# Patient Record
Sex: Male | Born: 1935
Health system: Southern US, Community
[De-identification: ages and names within clinical notes are randomized; demographics above are authoritative.]

## PROBLEM LIST (undated history)

## (undated) DIAGNOSIS — R011 Cardiac murmur, unspecified: Secondary | ICD-10-CM

## (undated) DIAGNOSIS — I10 Essential (primary) hypertension: Secondary | ICD-10-CM

## (undated) DIAGNOSIS — K5790 Diverticulosis of intestine, part unspecified, without perforation or abscess without bleeding: Secondary | ICD-10-CM

## (undated) DIAGNOSIS — M1990 Unspecified osteoarthritis, unspecified site: Secondary | ICD-10-CM

## (undated) DIAGNOSIS — J45909 Unspecified asthma, uncomplicated: Secondary | ICD-10-CM

## (undated) DIAGNOSIS — K219 Gastro-esophageal reflux disease without esophagitis: Secondary | ICD-10-CM

## (undated) DIAGNOSIS — M109 Gout, unspecified: Secondary | ICD-10-CM

## (undated) DIAGNOSIS — IMO0001 Reserved for inherently not codable concepts without codable children: Secondary | ICD-10-CM

## (undated) HISTORY — DX: Cardiac murmur, unspecified: R01.1

## (undated) HISTORY — DX: Unspecified osteoarthritis, unspecified site: M19.90

## (undated) HISTORY — DX: Essential (primary) hypertension: I10

## (undated) HISTORY — DX: Diverticulosis of intestine, part unspecified, without perforation or abscess without bleeding: K57.90

## (undated) HISTORY — DX: Gastro-esophageal reflux disease without esophagitis: K21.9

## (undated) HISTORY — DX: Reserved for inherently not codable concepts without codable children: IMO0001

## (undated) HISTORY — DX: Gout, unspecified: M10.9

## (undated) HISTORY — PX: JOINT REPLACEMENT: SHX530

## (undated) HISTORY — PX: EYE SURGERY: SHX253

## (undated) HISTORY — PX: INGUINAL HERNIA REPAIR: SUR1180

---

## 2000-09-28 ENCOUNTER — Encounter: Payer: Self-pay | Admitting: Family Medicine

## 2000-09-28 ENCOUNTER — Ambulatory Visit (HOSPITAL_COMMUNITY): Admission: RE | Admit: 2000-09-28 | Discharge: 2000-09-28 | Payer: Self-pay | Admitting: Family Medicine

## 2001-03-16 HISTORY — PX: COLONOSCOPY: SHX174

## 2001-06-07 ENCOUNTER — Encounter: Payer: Self-pay | Admitting: Internal Medicine

## 2001-06-07 ENCOUNTER — Ambulatory Visit (HOSPITAL_COMMUNITY): Admission: RE | Admit: 2001-06-07 | Discharge: 2001-06-07 | Payer: Self-pay | Admitting: Internal Medicine

## 2001-10-04 ENCOUNTER — Ambulatory Visit (HOSPITAL_COMMUNITY): Admission: RE | Admit: 2001-10-04 | Discharge: 2001-10-04 | Payer: Self-pay | Admitting: Family Medicine

## 2001-10-04 ENCOUNTER — Encounter: Payer: Self-pay | Admitting: Family Medicine

## 2001-10-21 ENCOUNTER — Ambulatory Visit (HOSPITAL_COMMUNITY): Admission: RE | Admit: 2001-10-21 | Discharge: 2001-10-21 | Payer: Self-pay | Admitting: General Surgery

## 2002-09-15 ENCOUNTER — Encounter: Payer: Self-pay | Admitting: Urology

## 2002-09-15 ENCOUNTER — Ambulatory Visit (HOSPITAL_COMMUNITY): Admission: RE | Admit: 2002-09-15 | Discharge: 2002-09-15 | Payer: Self-pay | Admitting: Urology

## 2003-09-12 ENCOUNTER — Ambulatory Visit (HOSPITAL_COMMUNITY): Admission: RE | Admit: 2003-09-12 | Discharge: 2003-09-12 | Payer: Self-pay | Admitting: Family Medicine

## 2004-05-02 ENCOUNTER — Ambulatory Visit (HOSPITAL_COMMUNITY): Admission: RE | Admit: 2004-05-02 | Discharge: 2004-05-02 | Payer: Self-pay | Admitting: Family Medicine

## 2004-09-26 ENCOUNTER — Ambulatory Visit (HOSPITAL_COMMUNITY): Admission: RE | Admit: 2004-09-26 | Discharge: 2004-09-26 | Payer: Self-pay | Admitting: Family Medicine

## 2005-03-16 HISTORY — PX: APPENDECTOMY: SHX54

## 2005-04-02 ENCOUNTER — Ambulatory Visit (HOSPITAL_COMMUNITY): Admission: RE | Admit: 2005-04-02 | Discharge: 2005-04-02 | Payer: Self-pay | Admitting: Family Medicine

## 2005-09-11 ENCOUNTER — Encounter (INDEPENDENT_AMBULATORY_CARE_PROVIDER_SITE_OTHER): Payer: Self-pay | Admitting: Specialist

## 2005-09-11 ENCOUNTER — Observation Stay (HOSPITAL_COMMUNITY): Admission: EM | Admit: 2005-09-11 | Discharge: 2005-09-12 | Payer: Self-pay | Admitting: Emergency Medicine

## 2005-12-15 ENCOUNTER — Ambulatory Visit (HOSPITAL_COMMUNITY): Admission: RE | Admit: 2005-12-15 | Discharge: 2005-12-15 | Payer: Self-pay | Admitting: Internal Medicine

## 2006-10-05 ENCOUNTER — Ambulatory Visit (HOSPITAL_COMMUNITY): Admission: RE | Admit: 2006-10-05 | Discharge: 2006-10-05 | Payer: Self-pay | Admitting: Family Medicine

## 2007-10-06 ENCOUNTER — Ambulatory Visit (HOSPITAL_COMMUNITY): Admission: RE | Admit: 2007-10-06 | Discharge: 2007-10-06 | Payer: Self-pay | Admitting: Family Medicine

## 2007-10-11 ENCOUNTER — Ambulatory Visit (HOSPITAL_COMMUNITY): Admission: RE | Admit: 2007-10-11 | Discharge: 2007-10-11 | Payer: Self-pay | Admitting: Family Medicine

## 2007-11-07 ENCOUNTER — Ambulatory Visit (HOSPITAL_COMMUNITY): Admission: RE | Admit: 2007-11-07 | Discharge: 2007-11-07 | Payer: Self-pay | Admitting: Family Medicine

## 2008-03-26 ENCOUNTER — Encounter: Payer: Self-pay | Admitting: Orthopedic Surgery

## 2008-03-26 ENCOUNTER — Ambulatory Visit (HOSPITAL_COMMUNITY): Admission: RE | Admit: 2008-03-26 | Discharge: 2008-03-26 | Payer: Self-pay | Admitting: Internal Medicine

## 2008-04-02 ENCOUNTER — Encounter: Payer: Self-pay | Admitting: Orthopedic Surgery

## 2008-04-04 ENCOUNTER — Encounter: Payer: Self-pay | Admitting: Orthopedic Surgery

## 2008-04-05 ENCOUNTER — Ambulatory Visit: Payer: Self-pay | Admitting: Orthopedic Surgery

## 2008-04-05 DIAGNOSIS — M1710 Unilateral primary osteoarthritis, unspecified knee: Secondary | ICD-10-CM

## 2008-04-05 DIAGNOSIS — IMO0002 Reserved for concepts with insufficient information to code with codable children: Secondary | ICD-10-CM | POA: Insufficient documentation

## 2008-06-18 ENCOUNTER — Ambulatory Visit: Payer: Self-pay | Admitting: Orthopedic Surgery

## 2008-07-18 ENCOUNTER — Ambulatory Visit: Payer: Self-pay | Admitting: Orthopedic Surgery

## 2008-08-08 ENCOUNTER — Ambulatory Visit: Payer: Self-pay | Admitting: Orthopedic Surgery

## 2008-09-24 ENCOUNTER — Ambulatory Visit: Payer: Self-pay | Admitting: Orthopedic Surgery

## 2008-10-09 ENCOUNTER — Ambulatory Visit (HOSPITAL_COMMUNITY): Admission: RE | Admit: 2008-10-09 | Discharge: 2008-10-09 | Payer: Self-pay | Admitting: Ophthalmology

## 2008-11-12 ENCOUNTER — Ambulatory Visit: Payer: Self-pay | Admitting: Orthopedic Surgery

## 2009-01-14 ENCOUNTER — Ambulatory Visit: Payer: Self-pay | Admitting: Orthopedic Surgery

## 2009-02-04 ENCOUNTER — Ambulatory Visit: Payer: Self-pay | Admitting: Orthopedic Surgery

## 2009-03-04 ENCOUNTER — Encounter: Payer: Self-pay | Admitting: Orthopedic Surgery

## 2009-03-04 ENCOUNTER — Telehealth: Payer: Self-pay | Admitting: Orthopedic Surgery

## 2009-03-12 ENCOUNTER — Ambulatory Visit: Payer: Self-pay | Admitting: Orthopedic Surgery

## 2009-04-01 ENCOUNTER — Ambulatory Visit (HOSPITAL_COMMUNITY): Admission: RE | Admit: 2009-04-01 | Discharge: 2009-04-01 | Payer: Self-pay | Admitting: Family Medicine

## 2009-04-17 ENCOUNTER — Ambulatory Visit: Payer: Self-pay | Admitting: Orthopedic Surgery

## 2009-04-22 ENCOUNTER — Ambulatory Visit (HOSPITAL_COMMUNITY): Admission: RE | Admit: 2009-04-22 | Discharge: 2009-04-22 | Payer: Self-pay | Admitting: Family Medicine

## 2009-05-13 ENCOUNTER — Ambulatory Visit: Payer: Self-pay | Admitting: Orthopedic Surgery

## 2009-06-17 ENCOUNTER — Encounter (INDEPENDENT_AMBULATORY_CARE_PROVIDER_SITE_OTHER): Payer: Self-pay | Admitting: *Deleted

## 2009-06-17 ENCOUNTER — Ambulatory Visit: Payer: Self-pay | Admitting: Orthopedic Surgery

## 2009-07-10 ENCOUNTER — Ambulatory Visit: Payer: Self-pay | Admitting: Orthopedic Surgery

## 2009-08-15 ENCOUNTER — Ambulatory Visit: Payer: Self-pay | Admitting: Orthopedic Surgery

## 2009-09-09 ENCOUNTER — Encounter: Payer: Self-pay | Admitting: Orthopedic Surgery

## 2009-09-11 ENCOUNTER — Ambulatory Visit: Payer: Self-pay | Admitting: Orthopedic Surgery

## 2009-09-13 ENCOUNTER — Encounter: Payer: Self-pay | Admitting: Orthopedic Surgery

## 2009-09-13 HISTORY — PX: TOTAL KNEE ARTHROPLASTY: SHX125

## 2009-09-17 ENCOUNTER — Telehealth (INDEPENDENT_AMBULATORY_CARE_PROVIDER_SITE_OTHER): Payer: Self-pay | Admitting: *Deleted

## 2009-09-18 ENCOUNTER — Ambulatory Visit: Payer: Self-pay | Admitting: Orthopedic Surgery

## 2009-09-19 ENCOUNTER — Encounter (INDEPENDENT_AMBULATORY_CARE_PROVIDER_SITE_OTHER): Payer: Self-pay | Admitting: *Deleted

## 2009-09-23 ENCOUNTER — Encounter: Payer: Self-pay | Admitting: Orthopedic Surgery

## 2009-09-24 ENCOUNTER — Ambulatory Visit: Payer: Self-pay | Admitting: Orthopedic Surgery

## 2009-09-24 ENCOUNTER — Inpatient Hospital Stay (HOSPITAL_COMMUNITY): Admission: RE | Admit: 2009-09-24 | Discharge: 2009-09-27 | Payer: Self-pay | Admitting: Orthopedic Surgery

## 2009-09-30 ENCOUNTER — Telehealth: Payer: Self-pay | Admitting: Orthopedic Surgery

## 2009-09-30 ENCOUNTER — Ambulatory Visit: Payer: Self-pay | Admitting: Orthopedic Surgery

## 2009-10-01 ENCOUNTER — Encounter: Payer: Self-pay | Admitting: Orthopedic Surgery

## 2009-10-02 ENCOUNTER — Encounter: Payer: Self-pay | Admitting: Orthopedic Surgery

## 2009-10-03 ENCOUNTER — Telehealth: Payer: Self-pay | Admitting: Orthopedic Surgery

## 2009-10-08 ENCOUNTER — Ambulatory Visit (HOSPITAL_COMMUNITY): Admission: RE | Admit: 2009-10-08 | Discharge: 2009-10-08 | Payer: Self-pay | Admitting: Family Medicine

## 2009-10-08 ENCOUNTER — Ambulatory Visit: Payer: Self-pay | Admitting: Orthopedic Surgery

## 2009-10-09 ENCOUNTER — Encounter: Payer: Self-pay | Admitting: Orthopedic Surgery

## 2009-10-09 ENCOUNTER — Telehealth: Payer: Self-pay | Admitting: Orthopedic Surgery

## 2009-10-10 ENCOUNTER — Encounter: Payer: Self-pay | Admitting: Orthopedic Surgery

## 2009-10-15 ENCOUNTER — Encounter: Payer: Self-pay | Admitting: Infectious Diseases

## 2009-10-16 ENCOUNTER — Telehealth: Payer: Self-pay | Admitting: Orthopedic Surgery

## 2009-10-21 ENCOUNTER — Ambulatory Visit: Payer: Self-pay | Admitting: Orthopedic Surgery

## 2009-10-24 ENCOUNTER — Encounter (HOSPITAL_COMMUNITY): Admission: RE | Admit: 2009-10-24 | Discharge: 2009-11-23 | Payer: Self-pay | Admitting: Orthopedic Surgery

## 2009-10-28 ENCOUNTER — Encounter: Payer: Self-pay | Admitting: Orthopedic Surgery

## 2009-11-11 ENCOUNTER — Ambulatory Visit: Payer: Self-pay | Admitting: Orthopedic Surgery

## 2009-11-25 ENCOUNTER — Encounter (HOSPITAL_COMMUNITY)
Admission: RE | Admit: 2009-11-25 | Discharge: 2009-12-25 | Payer: Self-pay | Source: Home / Self Care | Admitting: Orthopedic Surgery

## 2009-12-04 ENCOUNTER — Ambulatory Visit: Payer: Self-pay | Admitting: Orthopedic Surgery

## 2009-12-05 ENCOUNTER — Encounter: Payer: Self-pay | Admitting: Orthopedic Surgery

## 2009-12-25 ENCOUNTER — Ambulatory Visit: Payer: Self-pay | Admitting: Orthopedic Surgery

## 2010-01-20 ENCOUNTER — Encounter (INDEPENDENT_AMBULATORY_CARE_PROVIDER_SITE_OTHER): Payer: Self-pay | Admitting: *Deleted

## 2010-01-20 LAB — CONVERTED CEMR LAB
CO2: 29 meq/L
Calcium: 9.6 mg/dL
Creatinine, Ser: 1.25 mg/dL

## 2010-01-23 ENCOUNTER — Ambulatory Visit: Payer: Self-pay | Admitting: Orthopedic Surgery

## 2010-02-10 ENCOUNTER — Ambulatory Visit (HOSPITAL_COMMUNITY)
Admission: RE | Admit: 2010-02-10 | Discharge: 2010-02-10 | Payer: Self-pay | Source: Home / Self Care | Admitting: Family Medicine

## 2010-02-25 ENCOUNTER — Ambulatory Visit (HOSPITAL_COMMUNITY)
Admission: RE | Admit: 2010-02-25 | Discharge: 2010-02-25 | Payer: Self-pay | Source: Home / Self Care | Attending: Family Medicine | Admitting: Family Medicine

## 2010-02-25 ENCOUNTER — Ambulatory Visit: Payer: Self-pay | Admitting: Orthopedic Surgery

## 2010-03-13 ENCOUNTER — Encounter: Payer: Self-pay | Admitting: *Deleted

## 2010-03-14 ENCOUNTER — Ambulatory Visit: Payer: Self-pay | Admitting: Cardiology

## 2010-03-26 ENCOUNTER — Encounter: Payer: Self-pay | Admitting: Orthopedic Surgery

## 2010-04-02 ENCOUNTER — Ambulatory Visit: Admit: 2010-04-02 | Payer: Self-pay | Admitting: Orthopedic Surgery

## 2010-04-15 NOTE — Assessment & Plan Note (Signed)
Summary: WANTS FLUID DRAWN OFF LEFT KNEE/PYRAMID/BSF   Visit Type:  Follow-up  CC:  left knee pain.  History of Present Illness: I saw [name] in the office today for a followup visit.  He is a 77 years old man with the complaint of:  left knee swelling   DOS 10-01-09. Right total knee arthroplasty.  Meds:  Potassium, Torsemide, Robaxin, Restoril, HCTZ, Norco 5 as needed.  status post RIGHT total knee arthroplasty complains of LEFT knee pain. with swelling   Would like an injection.  We went ahead and aspirated and injected the LEFT knee. We got 75 cc of clear, yellow fluid, which was thick, and viscous.  We injected with steroids as well.  Patient like to set up the surgery next time       Allergies: 1)  ! Penicillin  Review of Systems Musculoskeletal:  Complains of joint pain, swelling, and stiffness.   Impression & Recommendations:  Problem # 1:  JOINT EFFUSION, KNEE (ICD-719.06) Assessment Deteriorated  Verbal consent was obtained. The knee was prepped with alcohol and ethyl chloride. 1 cc of depomedrol 40mg /cc and 4 cc of lidocaine 1% was injected. there were no complications.  Orders: Est. Patient Level III (16109) Joint Aspirate / Injection, Large (20610) Depo- Medrol 40mg  (J1030)  Patient Instructions: 1)  You have received an injection of cortisone today. You may experience increased pain at the injection site. Apply ice pack to the area for 20 minutes every 2 hours and take 2 xtra strength tylenol every 8 hours. This increased pain will usually resolve in 24 hours. The injection will take effect in 3-10 days.  2)  Please schedule a follow-up appointment as needed.   Orders Added: 1)  Est. Patient Level III [60454] 2)  Joint Aspirate / Injection, Large [20610] 3)  Depo- Medrol 40mg  [J1030]

## 2010-04-15 NOTE — Progress Notes (Signed)
Summary: Protime/Incision warm/pink  Phone Note Other Incoming   Summary of Call: sharon Walker/[facility] about [name] (08-23-2025) PT 17.6  INR 1.5 taking 1 mg Coumadin a day then alternating to 2 the next day.,   wants to know when to check protime again. 2.  Said his incision is pink and warm on each side  101.5 fever over the weekend, today 99.6 Told him to come in this afternoon tp have incision checked Initial call taken by: Jacklynn Ganong,  September 30, 2009 1:24 PM

## 2010-04-15 NOTE — Assessment & Plan Note (Signed)
Summary: FLUID BOTH KNEES/UMR/CAF   Visit Type:  Follow-up  CC:  swelling.  History of Present Illness: [name] is 77 years old has bilateral valgus osteoarthritis of the knees and comes infrequently for aspirations and injections  He is currently on Norco 5 mg q.4 p.r.n. pain  Swelling return in about 2 weeks ago and progressively increased.  Bilateral knee aspirations over 100 cc of clear yellow fluid injected both with 40 mg of Depo-Medrol  No complications  Verbal consent was obtained. The knee was prepped with alcohol and ethyl chloride. 1 cc of depomedrol 40mg /cc and 4 cc of lidocaine 1% was injected. there were no complications.-LT  Verbal consent was obtained. The knee was prepped with alcohol and ethyl chloride. 1 cc of depomedrol 40mg /cc and 4 cc of lidocaine 1% was injected. there were no complications.-RT        Allergies: 1)  ! Penicillin   Impression & Recommendations:  Problem # 1:  JOINT EFFUSION, KNEE (ICD-719.06) Assessment Deteriorated  Orders: Joint Aspirate / Injection, Large (20610) Depo- Medrol 40mg  (J1030)  Patient Instructions: 1)  You have received an injection of cortisone today. You may experience increased pain at the injection site. Apply ice pack to the area for 20 minutes every 2 hours and take 2 xtra strength tylenol every 8 hours. This increased pain will usually resolve in 24 hours. The injection will take effect in 3-10 days.  2)  Please schedule a follow-up appointment as needed.

## 2010-04-15 NOTE — Assessment & Plan Note (Signed)
Summary: 6 wk RE-CK/POST OP TKA,SURG 09/24/09/PYRAMID/CAF   Visit Type:  Follow-up  CC:  post op right knee.  History of Present Illness: 77 year old male now approximately 10 weeks status post RIGHT total knee arthroplasty for severe deformity and flexion contracture  DOS 10-01-09. Right total knee arthroplasty.  2 1/2 months   Meds:  Potassium, Torsemide, Robaxin, Restoril, HCTZ, Norco 5 as needed.   He does have some discomfort in his knee he is using topical Voltaren gell which seems to help  His range of motion is now 5-125  Exam plating with no assistive device.  He does complain of some LEFT knee pain but is not ready to have surgery there.  Allergies: 1)  ! Penicillin   Impression & Recommendations:  Problem # 1:  TOTAL KNEE FOLLOW-UP (ICD-V43.65)  Orders: Post-Op Check (81191)  Problem # 2:  KNEE, ARTHRITIS, DEGEN./OSTEO (ICD-715.96)  His updated medication list for this problem includes:    Bayer Aspirin 325 Mg Tabs (Aspirin)    Norco 5-325 Mg Tabs (Hydrocodone-acetaminophen) .Marland Kitchen... 1 by mouth q 4 as needed pain  Orders: Post-Op Check (47829)  Patient Instructions: 1)  Please schedule a follow-up appointment in 2 months.

## 2010-04-15 NOTE — Miscellaneous (Signed)
Summary: [facility] plan of care  [facility] plan of care   Imported By: Jacklynn Ganong 10/14/2009 10:39:28  _____________________________________________________________________  External Attachment:    Type:   Image     Comment:   External Document

## 2010-04-15 NOTE — Letter (Signed)
Summary: Out of Work  [facility]  [address]  [place], [place] [address]   Phone: [phone]  Fax: [phone]    June 17, 2009   Employee:  [employer]AHZAD THOMANN [facility] [facility] [employer]    To Whom It May Concern:   For Medical reasons, please excuse the above named employee from work for the following dates:  Start:   June 17, 2009  End/Return to work:   June 18, 2009  If you need additional information, please feel free to contact our office.         Sincerely,    Terrance Mass, MD

## 2010-04-15 NOTE — Progress Notes (Signed)
Summary: when to check Protime?  Phone Note Other Incoming   Summary of Call: [facility] [phone] Wants to know when to check protime for [facility] again.   Initial call taken by: Jacklynn Ganong,  October 03, 2009 1:45 PM  Follow-up for Phone Call        please tell [facility] that I check protimes on TKA patients on the Monday after surgery [if surgery on tues] then no more checks because the coumadin stops friday  Follow-up by: [name] MD,  October 04, 2009 7:15 AM  Additional Follow-up for Phone Call Additional follow up Details #1::        Advised [facility] of doctor's reply Additional Follow-up by: Jacklynn Ganong,  October 04, 2009 8:29 AM

## 2010-04-15 NOTE — Miscellaneous (Signed)
Summary: faxed med mod and [facility] for surg info  Clinical Lists Changes

## 2010-04-15 NOTE — Assessment & Plan Note (Signed)
Summary: KNEE PAIN/STATES DIFF WALKING/TODAY'S OPT/CAF   Visit Type:  Follow-up  CC:  bilateral knee pain.  History of Present Illness:  [name] has bilateral valgus knee osteoarthritis comes in for injections periodically not ready to have surgery although we've advised him to have it on several occasions.  We aspirated 50 cc from the RIGHT knee and 60 on the LEFT knee  Verbal consent was obtained. The knee was prepped with alcohol and ethyl chloride. 1 cc of depomedrol 40mg /cc and 4 cc of lidocaine 1% was injected. there were no complications.  Verbal consent was obtained. The knee was prepped with alcohol and ethyl chloride. 1 cc of depomedrol 40mg /cc and 4 cc of lidocaine 1% was injected. there were no complications.        Allergies: 1)  ! Penicillin   Impression & Recommendations:  Problem # 1:  JOINT EFFUSION, KNEE (ICD-719.06) Assessment Deteriorated  Orders: Joint Aspirate / Injection, Large (20610) Depo- Medrol 40mg  (J1030)  Patient Instructions: 1)  You have received an injection of cortisone today. You may experience increased pain at the injection site. Apply ice pack to the area for 20 minutes every 2 hours and take 2 xtra strength tylenol every 8 hours. This increased pain will usually resolve in 24 hours. The injection will take effect in 3-10 days.  Prescriptions: NORCO 5-325 MG TABS (HYDROCODONE-ACETAMINOPHEN) 1 by mouth q 4 as needed pain  #42 x 5   Entered and Authorized by:   [name] MD   Signed by:   [name] MD on 07/10/2009   Method used:   Print then Give to Patient   RxID:   [id number]

## 2010-04-15 NOTE — Letter (Signed)
Summary: Surgery order TKA RT Scheduled 09/24/09  Surgery order TKA RT Scheduled 09/24/09   Imported By: Cammie Sickle 09/24/2009 09:52:00  _____________________________________________________________________  External Attachment:    Type:   Image     Comment:   External Document

## 2010-04-15 NOTE — Miscellaneous (Signed)
Summary: [facility] orders  [facility] orders   Imported By: Jacklynn Ganong 10/04/2009 08:09:40  _____________________________________________________________________  External Attachment:    Type:   Image     Comment:   External Document

## 2010-04-15 NOTE — Miscellaneous (Signed)
Summary: PT clinical evaluation  PT clinical evaluation   Imported By: Jacklynn Ganong 11/05/2009 12:23:09  _____________________________________________________________________  External Attachment:    Type:   Image     Comment:   External Document

## 2010-04-15 NOTE — Miscellaneous (Signed)
Summary: [facility] PT progress note  [facility] PT progress note   Imported By: Jacklynn Ganong 10/15/2009 14:54:25  _____________________________________________________________________  External Attachment:    Type:   Image     Comment:   External Document

## 2010-04-15 NOTE — Miscellaneous (Signed)
Visit Type:  Follow-up  CC:  Oa bilateral knees.  History of Present Illness: I saw [name] in the office today for a followup visit.  He is a 77 years old man with the complaint of:  bilateral knee pain.  Right knee is worse.  To schedule total knee replacement  Xrays today.  Norco 5 for pain, Torsemide 10mg , Ranitidine 150, ASA 325 as needed, Potassium cl 10 meq as needed, HCTZ 25 as needed.  No chest pain.  Allergic to PCN.  [name] is been followed by me for over a year now with frequent effusions bilateral knee pain with severe valgus deformity and decreasing ability to ambulate.  I've aspirated his knee several times often getting back 6200 cc of fluid.  He is finally succumbed to disabling RIGHT knee pain.    Current Medications (verified): 1)  Hydrochlorothiazide 12.5 Mg Caps (Hydrochlorothiazide) 2)  Zantac 150 Mg Caps (Ranitidine Hcl) 3)  Bayer Aspirin 325 Mg Tabs (Aspirin) 4)  Norco 5-325 Mg Tabs (Hydrocodone-Acetaminophen) .Marland Kitchen.. 1 By Mouth Q 4 As Needed Pain  Allergies (verified): 1)  ! Penicillin  Past History:  Past Medical History: Last updated: 04/05/2008 htn reflux  Past Surgical History: Last updated: 04/05/2008 Na  Family History: Last updated: 04/05/2008 Family History Coronary Heart Disease male < 84 Family History of Arthritis  Social History: Last updated: 04/05/2008 Patient is married.  works at hospital  Risk Factors: Caffeine Use: 2 (04/05/2008)  Risk Factors: Smoking Status: never (04/05/2008)  Family History: Reviewed history from 04/05/2008 and no changes required. Family History Coronary Heart Disease male < 37 Family History of Arthritis  Social History: Reviewed history from 04/05/2008 and no changes required. Patient is married.  works at hospital  Review of Systems Musculoskeletal:  See HPI.  The review of systems is negative for Constitutional, Cardiovascular, Respiratory, Gastrointestinal,  Genitourinary, Neurologic, Endocrine, Psychiatric, Skin, HEENT, Immunology, and Hemoatologic.  Physical Exam  General exam this is a very thin male well-developed well-nourished grooming and hygiene are normal he has bilateral valgus knee deformity  His cardiovascular findings include normal palpation of his pulses with no swelling or distal edema  He has negative lymph nodes  His skin is warm dry and intact with no rashes or lesions  Neurologically he is intact he's awake alert and oriented x3 his mood and affect are normal  It is normal coordination sensation and reflexes  His gait pattern is altered with bilateral valgus deformity he has a shuffling-type gait  He has decreased stride length and cadence  On inspection has bilateral valgus deformity which is severe and not correctable it is a fixed contracture on both knees his range of motion is approximately 110 he has a flexion contracture of 10-15  Strength is normal  Both knees are stable there is no laxity  Upper extremities are normal   Impression & Recommendations:  Problem # 1:  KNEE, ARTHRITIS, DEGEN./OSTEO (ZOX-096.04) Assessment Deteriorated  Informed consent process: I have discussed the procedure with the patient. I have answered their questions. The risks of bleeding, infection, nerve and vascualr injury have been discussed. The diagnosis and reason for surgery have been explained. The patient demonstrates understanding of this discussion. Specific to this procedure risks include:  Stiffness Pain Infection-can lead to further surgery and amputation      Norco 5-325 Mg Tabs (Hydrocodone-acetaminophen) .Marland Kitchen... 1 by mouth q 4 as needed pain  Orders: Est. Patient Level IV (54098) Knee x-ray,  3 views (11914) severe valgus  deformity approximately 20 or more with severe joint space narrowing laterally, severe arthritis  PLANNED PROCEDURE: RIGHT TKA,  DEPUY  Patient Instructions: 1)  DOS 09/24/09 2)  I will  call you for your preop, take the packet with you for your preop to [facility]. 3)  Post op 1 in our office on 10/08/09 thanks 4)  Do not take anymore aspirin.

## 2010-04-15 NOTE — Assessment & Plan Note (Signed)
Summary: RT KNEE PAIN/?DISCUSS SURG//PYRAMID/CAF   Visit Type:  Follow-up  CC:  bilateral knee pain.Marland Kitchen  History of Present Illness: I saw [name] in the office today for a followup visit.  He is a 77 years old man with the complaint of:  bilateral knee pain.presents for bilateral aspirations and injections strongly considering surgery but not yet according to him we'll try to schedule it next week  I aspirated over 50 cc of fluid from both knees and then injected both knees with steroids  Allergies: 1)  ! Penicillin   Impression & Recommendations:  Problem # 1:  JOINT EFFUSION, KNEE (ICD-719.06) Assessment Deteriorated  Verbal consent was obtained. The knee was prepped with alcohol and ethyl chloride. 1 cc of depomedrol 40mg /cc and 4 cc of lidocaine 1% was injected. there were no complications.  RIGHT and LEFT knee same procedure  Orders: Depo- Medrol 40mg  (J1030) Joint Aspirate / Injection, Large (20610)  Patient Instructions: 1)  Schedule for preop next week

## 2010-04-15 NOTE — Progress Notes (Signed)
Summary: still taking the coumadin  Phone Note Call from Patient   Summary of Call: Dahlia Bailiff Kruck's wife just call and said Mr Dibello is still taking the coumadin and still has 4 pills left.  They are confused as to why the protime has not been checked.    His # [id number] Initial call taken by: Jacklynn Ganong,  October 09, 2009 11:51 AM

## 2010-04-15 NOTE — Miscellaneous (Signed)
Summary: PT progress note  PT progress note   Imported By: Jacklynn Ganong 12/09/2009 09:02:22  _____________________________________________________________________  External Attachment:    Type:   Image     Comment:   External Document

## 2010-04-15 NOTE — Assessment & Plan Note (Signed)
Summary: request injection in knees/todays options/bsf   Visit Type:  Follow-up  CC:  bilateral knee pain.  History of Present Illness: I saw [name] in the office today for a followup visit.  He is a 77 years old man with the complaint of:  Bilateral knee pain, requests injections.  Meds: Norco 5, does not help much, he wants something stronger.  His right knee hurts the worse.  07/10/09 last aspiration and injections.     Allergies: 1)  ! Penicillin   Impression & Recommendations:  Problem # 1:  JOINT EFFUSION, KNEE (ICD-719.06) Assessment Comment Only  ok to take 2 tabs at a time   Orders: Joint Aspirate / Injection, Large (20610) Depo- Medrol 40mg  (J1030)  Problem # 2:  KNEE, ARTHRITIS, DEGEN./OSTEO (ICD-715.96) Assessment: Comment Only  His updated medication list for this problem includes:    Bayer Aspirin 325 Mg Tabs (Aspirin)    Norco 5-325 Mg Tabs (Hydrocodone-acetaminophen) .Marland Kitchen... 1 by mouth q 4 as needed pain  Orders: Joint Aspirate / Injection, Large (20610) Depo- Medrol 40mg  (J1030)  Problem # 3:  JOINT EFFUSION, KNEE (ICD-719.06)  I aspirated the RIGHT knee and then injected it with Depo-Medrol and lidocaine.  60 cc  LEFT knee aspirated 80 cc  Injected also with Depo-Medrol lidocaine  Orders: Joint Aspirate / Injection, Large (20610) Depo- Medrol 40mg  (J1030)  Patient Instructions: 1)  You have received an injection of cortisone today. You may experience increased pain at the injection site. Apply ice pack to the area for 20 minutes every 2 hours and take 2 xtra strength tylenol every 8 hours. This increased pain will usually resolve in 24 hours. The injection will take effect in 3-10 days.  2)  NEXT APPT SCHEDULE FOR TKA  3)  [HE WILL TELL YOU WHEN]

## 2010-04-15 NOTE — Letter (Signed)
Summary: Out of Work  [facility]  [address]  [place], [place] [address]   Phone: [phone]  Fax: [phone]    September 09, 2009   Employee:  [name] [facility]    To Whom It May Concern:   For Medical reasons, please excuse the above named employee from work for the following date:  Start:   September 09, 2009  End/Return to work:  September 11, 2009    If you need additional information, please feel free to contact our office.         Sincerely,    [name], [name], MD

## 2010-04-15 NOTE — Letter (Signed)
Summary: Letter of medical necessity/cpm machine   Letter of medical necessity/cpm machine   Imported By: Jacklynn Ganong 10/03/2009 11:36:08  _____________________________________________________________________  External Attachment:    Type:   Image     Comment:   External Document

## 2010-04-15 NOTE — Miscellaneous (Signed)
  advised to stop taking warfarin   Appended Document:  reume aspirin

## 2010-04-15 NOTE — Assessment & Plan Note (Signed)
Summary: INCISION WARM [name] PINK//BSF   Visit Type:  Follow-up  CC:  post op right knee TKA.  History of Present Illness: 77 year old male recently discharged from the hospital on Friday, July 15th status post RIGHT total knee arthroplasty presents for evaluation of RIGHT knee incision, this is postop day 6  Running low grade temperature 99, takes Tylenol.  Meds: Coumadin, Potassium, Torsemide, Robaxin, Restoril, HCTZ, Norco 5, Stool softener.  Norco 5 , takes 2 every 4 hrs sometimes, helps, 1 by mouth does not help.  Has hiccups alot since surgery.  I don't see any signs of any problems with the incision.  He does have some RIGHT ankle swelling which has been chronic and his RIGHT leg is a little swollen but nothing out of the ordinary  He has moved his bowels we'll advise him to get some milk of magnesia and take 2 tablespoons every morning until he is well      Allergies: 1)  ! Penicillin   Impression & Recommendations:  Problem # 1:  TOTAL KNEE FOLLOW-UP (ICD-V43.65) Assessment Comment Only  Orders: Post-Op Check (06237)  Patient Instructions: 1)  keep scheduled followup

## 2010-04-15 NOTE — Assessment & Plan Note (Signed)
Summary: POST OP 1/RT TKA SURG 09/24/09/PYRAMID INS/CAF   Visit Type:  post op  CC:  post op right knee.  History of Present Illness: I saw Irma Vanalstine in the office today for a followup visit.  He is a 77 years old man with the complaint of:  post op , right knee.  DOS 10-01-09. Right total knee arthroplasty.  Meds: Coumadin, Potassium, Torsemide, Robaxin, Restoril, HCTZ, Norco 5, Stool softener.  requires Norco 5 mg 2 every 4 hours as one does not help him  Incision looks good knee flexion is 120 has a 10 flexion deficit which is not that unexpected based on his preop flexion contracture templating 200 feet  Recommended continued therapy work on extension with passive knee extension supine and then prone hang some return 4 weeks for a routine checkup transferred outpatient therapy in 1-2 week     Allergies: 1)  ! Penicillin   Impression & Recommendations:  Problem # 1:  TOTAL KNEE FOLLOW-UP (ICD-V43.65) Assessment Improved  Orders: Post-Op Check (81191)  Problem # 2:  KNEE, ARTHRITIS, DEGEN./OSTEO (ICD-715.96) Assessment: Improved  His updated medication list for this problem includes:    Bayer Aspirin 325 Mg Tabs (Aspirin)    Norco 5-325 Mg Tabs (Hydrocodone-acetaminophen) .Marland Kitchen... 1 by mouth q 4 as needed pain  Patient Instructions: 1)  Please schedule a follow-up appointment in 1 month.

## 2010-04-15 NOTE — Progress Notes (Signed)
Summary: call from physical therapist for order  Phone Note Other Incoming   Caller: [facility] Summary of Call: [facility] therapist Creta Levin, called to relay that she has seen patient for home therapy today and that he is ready to be discharged to out-patient therapy.  Order requested for therapy at [facility]. Her ph # is [phone] if any questions. Initial call taken by: Cammie Sickle,  October 16, 2009 4:27 PM  Follow-up for Phone Call        ok faxed order to [facility] Follow-up by: Ether Griffins,  October 16, 2009 4:48 PM

## 2010-04-15 NOTE — Miscellaneous (Signed)
Summary: [facility] orders  [facility] orders   Imported By: Jacklynn Ganong 10/14/2009 10:38:42  _____________________________________________________________________  External Attachment:    Type:   Image     Comment:   External Document

## 2010-04-15 NOTE — Letter (Signed)
Summary: Out of Work  [facility]  [address]  [place], [place] [address]   Phone: [phone]  Fax: [phone]    September 13, 2009   Employee:  DELNO BLAISDELL    To Whom It May Concern:   For Medical reasons, please excuse the above named employee from work for the following dates:  Start:   September 13, 2009  End:   September 18, 2009             Next scheduled appointment:  September 18, 2009  If you need additional information, please feel free to contact our office.         Sincerely,    Terrance Mass, MD

## 2010-04-15 NOTE — Progress Notes (Signed)
Summary: Pre-auth Not required for surgery,see Ref #  Phone Note Outgoing Call   Call placed to: Insurer Summary of Call: Started pre-certification request with insurer [employer].  Patient scheduled for pre-op appointment here 09/18/09, following visit 09/11/09 Re: schedule of Total knee arthroplasty, CPT [phone], for 09/24/09.   Per Maralyn Sago, no prior Berkley Harvey is required.  The admission face sheet is needed from [facility] at time of admission -  FAX to [phone].  I fwd'd this Information to [facility] pre-auth and admission contacts: April R and Linda M.  Call REF# [id number]. Initial call taken by: Cammie Sickle,  September 17, 2009 9:45 AM

## 2010-04-15 NOTE — Assessment & Plan Note (Signed)
Summary: PRE-OP KNEE SURGERY/PYRAMID TODAY'S OPTIONS/CAF   Visit Type:  Follow-up  CC:  Oa bilateral knees.  History of Present Illness: I saw [name] in the office today for a followup visit.  He is a 77 years old man with the complaint of:  bilateral knee pain.  Right knee is worse.  To schedule total knee replacement  Xrays today.  Norco 5 for pain, Torsemide 10mg , Ranitidine 150, ASA 325 as needed, Potassium cl 10 meq as needed, HCTZ 25 as needed.  No chest pain.  Allergic to PCN.  [name] is been followed by me for over a year now with frequent effusions bilateral knee pain with severe valgus deformity and decreasing ability to ambulate.  I've aspirated his knee several times often getting back 6200 cc of fluid.  He is finally succumbed to disabling RIGHT knee pain.    Current Medications (verified): 1)  Hydrochlorothiazide 12.5 Mg Caps (Hydrochlorothiazide) 2)  Zantac 150 Mg Caps (Ranitidine Hcl) 3)  Bayer Aspirin 325 Mg Tabs (Aspirin) 4)  Norco 5-325 Mg Tabs (Hydrocodone-Acetaminophen) .Marland Kitchen.. 1 By Mouth Q 4 As Needed Pain  Allergies (verified): 1)  ! Penicillin  Past History:  Past Medical History: Last updated: 04/05/2008 htn reflux  Past Surgical History: Last updated: 04/05/2008 Na  Family History: Last updated: 04/05/2008 Family History Coronary Heart Disease male < 70 Family History of Arthritis  Social History: Last updated: 04/05/2008 Patient is married.  works at hospital  Risk Factors: Caffeine Use: 2 (04/05/2008)  Risk Factors: Smoking Status: never (04/05/2008)  Family History: Reviewed history from 04/05/2008 and no changes required. Family History Coronary Heart Disease male < 48 Family History of Arthritis  Social History: Reviewed history from 04/05/2008 and no changes required. Patient is married.  works at hospital  Review of Systems Musculoskeletal:  See HPI.  The review of systems is negative for  Constitutional, Cardiovascular, Respiratory, Gastrointestinal, Genitourinary, Neurologic, Endocrine, Psychiatric, Skin, HEENT, Immunology, and Hemoatologic.  Physical Exam  Additional Exam:  General exam this is a very thin male well-developed well-nourished grooming and hygiene are normal he has bilateral valgus knee deformity  His cardiovascular findings include normal palpation of his pulses with no swelling or distal edema  He has negative lymph nodes  His skin is warm dry and intact with no rashes or lesions  Neurologically he is intact he's awake alert and oriented x3 his mood and affect are normal  It is normal coordination sensation and reflexes  His gait pattern is altered with bilateral valgus deformity he has a shuffling-type gait  He has decreased stride length and cadence  On inspection has bilateral valgus deformity which is severe and not correctable it is a fixed contracture on both knees his range of motion is approximately 110 he has a flexion contracture of 10-15  Strength is normal  Both knees are stable there is no laxity  Upper extremities are normal   Impression & Recommendations:  Problem # 1:  KNEE, ARTHRITIS, DEGEN./OSTEO (ZOX-096.04) Assessment Deteriorated  Informed consent process: I have discussed the procedure with the patient. I have answered their questions. The risks of bleeding, infection, nerve and vascualr injury have been discussed. The diagnosis and reason for surgery have been explained. The patient demonstrates understanding of this discussion. Specific to this procedure risks include:  Stiffness Pain Infection-can lead to further surgery and amputation      Norco 5-325 Mg Tabs (Hydrocodone-acetaminophen) .Marland Kitchen... 1 by mouth q 4 as needed pain  Orders: Est. Patient  Level IV (98119) Knee x-ray,  3 views (14782) severe valgus deformity approximately 20 or more with severe joint space narrowing laterally, severe arthritis  PLANNED  PROCEDURE: RIGHT TKA,  DEPUY   Patient Instructions: 1)  DOS 09/24/09 2)  I will call you for your preop, take the packet with you for your preop to [facility]. 3)  Post op 1 in our office on 10/08/09 thanks 4)  Do not take anymore aspirin.

## 2010-04-15 NOTE — Assessment & Plan Note (Signed)
Summary: BILAT KNEE PAIN/REQ INJECTION/TODAY'S OPT/CAF   Visit Type:  Follow-up  CC:  bilateral knee pain.  History of Present Illness: [name] has bilateral valgus knee osteoarthritis comes in for injections periodically not ready to have surgery although we've advised him to have it on several occasions.  We aspirated 60 cc of yellow fluid from the LEFT knee and 85 cc from the RIGHT knee we then injected both knees  Verbal consent was obtained. The knee was prepped with alcohol and ethyl chloride. 1 cc of depomedrol 40mg /cc and 4 cc of lidocaine 1% was injected. there were no complications.  Verbal consent was obtained. The knee was prepped with alcohol and ethyl chloride. 1 cc of depomedrol 40mg /cc and 4 cc of lidocaine 1% was injected. there were no complications.        Allergies: 1)  ! Penicillin  Physical Exam  Additional Exam:  68l  85 r   Impression & Recommendations:  Problem # 1:  JOINT EFFUSION, KNEE (ICD-719.06) Assessment Deteriorated  Orders: Joint Aspirate / Injection, Large (20610) Depo- Medrol 40mg  (J1030)  Problem # 2:  KNEE, ARTHRITIS, DEGEN./OSTEO (ICD-715.96) Assessment: Deteriorated  His updated medication list for this problem includes:    Bayer Aspirin 325 Mg Tabs (Aspirin)    Norco 5-325 Mg Tabs (Hydrocodone-acetaminophen) .Marland Kitchen... 1 by mouth q 4 as needed pain  Orders: Joint Aspirate / Injection, Large (20610) Depo- Medrol 40mg  (J1030)  Patient Instructions: 1)  You have received an injection of cortisone today. You may experience increased pain at the injection site. Apply ice pack to the area for 20 minutes every 2 hours and take 2 xtra strength tylenol every 8 hours. This increased pain will usually resolve in 24 hours. The injection will take effect in 3-10 days.  2)  Please schedule a follow-up appointment as needed.

## 2010-04-15 NOTE — Assessment & Plan Note (Signed)
Summary: POST OP 2/TKA SURG 09/24/09/PYRAMID/CAF   Visit Type:  Follow-up  CC:  post op TKA.  History of Present Illness: I saw [name] in the office today for a followup visit.  He is a 77 years old man with the complaint of: post op , right knee.  DOS 10-01-09. Right total knee arthroplasty.  Meds:  Potassium, Torsemide, Robaxin, Restoril, HCTZ, Norco 5 as needed.  Today is the patient is a 5-6 weeks out from his RIGHT total knee arthroplasty he is taking Lasix for bilateral edema in his feet which is chronic  He is improving gradually doing better.  Has about a 5 flexion contracture from a 25 deformity which is good is making progress with his flexion and he is continuing to improve  Allergies: 1)  ! Penicillin   Impression & Recommendations:  Problem # 1:  TOTAL KNEE FOLLOW-UP (ICD-V43.65) Assessment Improved  Orders: Post-Op Check (16109)  Problem # 2:  KNEE, ARTHRITIS, DEGEN./OSTEO (ICD-715.96) Assessment: Improved  His updated medication list for this problem includes:    Bayer Aspirin 325 Mg Tabs (Aspirin)    Norco 5-325 Mg Tabs (Hydrocodone-acetaminophen) .Marland Kitchen... 1 by mouth q 4 as needed pain  Orders: Post-Op Check (60454)  Patient Instructions: 1)  2nd week of OCTOBER follow up

## 2010-04-15 NOTE — Assessment & Plan Note (Signed)
Summary: wants fluid drawn left knee/todays options/bsf   Visit Type:  Follow-up  CC:  left knee pain.  History of Present Illness: I saw [name] in the office today for a followup visit.  He is a 77 years old man with the complaint of:  left knee  DOS 10-01-09. Right total knee arthroplasty.  Meds:  Potassium, Torsemide, Robaxin, Restoril, HCTZ, Norco 5 as needed.  status post RIGHT total knee arthroplasty complains of LEFT knee pain. Would like an injection.  Aspiration injection LEFT knee.  80 cc of fluid removed from the LEFT knee injected with steroids   Verbal consent was obtained. The knee was prepped with alcohol and ethyl chloride. 1 cc of depomedrol 40mg /cc and 4 cc of lidocaine 1% was injected. there were no complications.    Allergies: 1)  ! Penicillin  Review of Systems Musculoskeletal:  RIGHT knee is doing much better although he has some anterior knee pain.  I did place him on some Voltaren gel 4q 6 hours.   Impression & Recommendations:  Problem # 1:  JOINT EFFUSION, KNEE (ICD-719.06) Assessment Deteriorated  Orders: Est. Patient Level III (09323) Joint Aspirate / Injection, Large (20610) Depo- Medrol 40mg  (F5732)  Patient Instructions: 1)  Has appt for 2nd week of Oct

## 2010-04-15 NOTE — Letter (Signed)
Summary: Out of Work  [facility]  [address]  [place], [place] [address]   Phone: [phone]  Fax: [phone]    August 15, 2009   Employee:  CARVELL HOEFFNER [facility]    To Whom It May Concern:   For Medical reasons, please excuse the above named employee from work for the following dates:  JUNE  2 , 2011 If you need additional information, please feel free to contact our office.         Sincerely,    [name] MD

## 2010-04-15 NOTE — Letter (Signed)
Summary: Out of Work  [facility]  [address]  [place], [place] [address]   Phone: [phone]  Fax: [phone]    September 18, 2009   Employee:  [name]    To Whom It May Concern:  Patient had an appointment in our office today.  For Medical reasons, please excuse the above named employee from work for the following dates:  Start:   09/18/09  End:   12/23/09, or until further notice.    If you need additional information, please feel free to contact our office.         Sincerely,    Terrance Mass, MD

## 2010-04-15 NOTE — Assessment & Plan Note (Signed)
Summary: wants fluid drawn from knee/todays options/bsf   Visit Type:  Follow-up  CC:  knee swelling .  History of Present Illness: [name] is 77 years old has bilateral valgus osteoarthritis of the knees and comes infrequently for aspirations and injections  He is currently on Norco 5 mg q.4 p.r.n. pain  knee swelling again   Bilateral knee aspirations over 100 cc of clear yellow fluid injected both with 40 mg of Depo-Medrol  No complications  Verbal consent was obtained. The knee was prepped with alcohol and ethyl chloride. 1 cc of depomedrol 40mg /cc and 4 cc of lidocaine 1% was injected. there were no complications.-LT  Verbal consent was obtained. The knee was prepped with alcohol and ethyl chloride. 1 cc of depomedrol 40mg /cc and 4 cc of lidocaine 1% was injected. there were no complications.-RT        Allergies: 1)  ! Penicillin   Impression & Recommendations:  Problem # 1:  JOINT EFFUSION, KNEE (ICD-719.06) Assessment Deteriorated  Orders: Joint Aspirate / Injection, Large (20610) Depo- Medrol 40mg  (J1030)  Patient Instructions: 1)  Please schedule a follow-up appointment as needed.

## 2010-04-15 NOTE — Assessment & Plan Note (Signed)
Summary: LT KNEE PAIN/REQ'S INJEC/PYRAMID/CAF   Visit Type:  Follow-up  CC:  left knee pain.  History of Present Illness: I saw [name] in the office today for a followup visit.  He is a 77 years old man with the complaint of:  left knee pain, requesting injection for OA.  status post RIGHT total knee arthroplasty complains of LEFT knee pain. Would like an injection.  Patient's LEFT knee was injected with a dose of Depo-Medrol and lidocaine. Tolerated well without complications. Under sterile technique  Allergies: 1)  ! Penicillin   Impression & Recommendations:  Problem # 1:  JOINT EFFUSION, KNEE (ICD-719.06) Assessment Deteriorated aspiration injection, LEFT knee.  The LEFT knee was aspirated, and we were able to obtain 60 cc of clear, yellow fluid. We then injected with Depo-Medrol and lidocaine Orders: Joint Aspirate / Injection, Large (20610) Depo- Medrol 40mg  (J1030)  Patient Instructions: 1)  You have received an injection of cortisone today. You may experience increased pain at the injection site. Apply ice pack to the area for 20 minutes every 2 hours and take 2 xtra strength tylenol every 8 hours. This increased pain will usually resolve in 24 hours. The injection will take effect in 3-10 days.  2)  keep previous appt

## 2010-04-17 NOTE — Miscellaneous (Signed)
Summary: LABS BMP 01/20/2010  Clinical Lists Changes  Observations: Added new observation of CALCIUM: 9.6 mg/dL (16/12/9602 54:09) Added new observation of CREATININE: 1.25 mg/dL (81/19/1478 29:56) Added new observation of BUN: 13 mg/dL (21/30/8657 84:69) Added new observation of BG RANDOM: 95 mg/dL (62/95/2841 32:44) Added new observation of CO2 PLSM/SER: 29 meq/L (01/20/2010 17:06) Added new observation of CL SERUM: 108 meq/L (01/20/2010 17:06) Added new observation of K SERUM: 4.5 meq/L (01/20/2010 17:06) Added new observation of NA: 145 meq/L (01/20/2010 17:06)

## 2010-04-17 NOTE — Assessment & Plan Note (Signed)
Summary: 2 M RE-CK RT KNEE/TKA SURG 09/24/09/PYRAMID/CAF   Visit Type:  Follow-up  CC:  recheck right knee.  History of Present Illness: I saw [name] in the office today for a followup visit.  He is a 77 years old man with the complaint of:  recheck right knee  DOS 10-01-09. Right total knee arthroplasty.  Meds:  Potassium, Torsemide, Robaxin, Restoril, HCTZ, Norco 5 as needed, new med chest congestion  Today is recheck right knee.  He did have some shortness of breath and wheezing his primary care doctor to workup the cough and shortness of breath. He's been tested and will require clearance before having LEFT knee replacement surgery. He does have a clicking sensation in his RIGHT knee and pain at night. No pain with walking, and the clicking does not hurt.  He has excellent flexion and the knee, slight flexion contracture. The knee was completely stable with no palpable tenderness.           Allergies: 1)  ! Penicillin   Impression & Recommendations:  Problem # 1:  TOTAL KNEE FOLLOW-UP (ICD-V43.65) Assessment Comment Only  RIGHT knee, stable.  Orders: Est. Patient Level II (62952)  Problem # 2:  KNEE, ARTHRITIS, DEGEN./OSTEO (ICD-715.96) LEFT knee, stable waiting surgery pending. medical clearance His updated medication list for this problem includes:    Bayer Aspirin 325 Mg Tabs (Aspirin)    Norco 5-325 Mg Tabs (Hydrocodone-acetaminophen) .Marland Kitchen... 1 by mouth q 4 as needed pain  Patient Instructions: 1)  Hope you feel better soon 2)  come back in 2 months   Orders Added: 1)  Est. Patient Level II [84132]

## 2010-04-17 NOTE — Miscellaneous (Signed)
  Verbal consent obtained patient wanted his LEFT knee aspirated.  Aspiration injection, LEFT knee.  Aspirated 100 cc of yellow fluid, secondary to pain and swelling.  Injected 40 mg Depo-Medrol  Clinical Lists Changes

## 2010-04-29 ENCOUNTER — Ambulatory Visit (INDEPENDENT_AMBULATORY_CARE_PROVIDER_SITE_OTHER): Payer: Medicare Other | Admitting: Orthopedic Surgery

## 2010-04-29 ENCOUNTER — Ambulatory Visit: Payer: Self-pay | Admitting: Orthopedic Surgery

## 2010-04-29 ENCOUNTER — Encounter (INDEPENDENT_AMBULATORY_CARE_PROVIDER_SITE_OTHER): Payer: Self-pay | Admitting: *Deleted

## 2010-04-29 ENCOUNTER — Encounter: Payer: Self-pay | Admitting: Orthopedic Surgery

## 2010-04-29 DIAGNOSIS — M25469 Effusion, unspecified knee: Secondary | ICD-10-CM

## 2010-04-29 LAB — CONVERTED CEMR LAB
BUN: 12 mg/dL
Chloride: 104 meq/L
Creatinine, Ser: 1.14 mg/dL
HDL: 52 mg/dL
Triglycerides: 160 mg/dL

## 2010-04-30 ENCOUNTER — Encounter: Payer: Self-pay | Admitting: Orthopedic Surgery

## 2010-05-06 ENCOUNTER — Encounter (INDEPENDENT_AMBULATORY_CARE_PROVIDER_SITE_OTHER): Payer: Self-pay | Admitting: *Deleted

## 2010-05-07 NOTE — Assessment & Plan Note (Addendum)
Summary: 2 m reck rt knee/sptka sur+eval lt knee/[facility]/ca...   Visit Type:  Follow-up  CC:  right knee.  History of Present Illness: I saw [name] in the office today for a 2 month followup visit.  He is a 77 years old man with the complaint of:  righ tknee replacement.  DOS 10-01-09. Right total knee arthroplasty.  Meds:  Potassium, Torsemide, Robaxin, Restoril, HCTZ, Norco 5 as needed, new med chest congestion  Today is recheck right knee.  Complaints: he states that his right knee hurts at night in the bed. the left knee hurts all the time.  Allergies: 1)  ! Penicillin  Review of Systems Cardiovascular:  undergoing cardiac clearance .   Knee Exam  General:    Well-developed, well-nourished, normal body habitus; no deformities, normal grooming.  Gait:    limp noted-left.    Skin:    Intact, no scars, lesions, rashes, cafe au lait spots, or bruising.    Inspection:    swelling: left knee joint   Palpation:    tenderness L-lateral joint line.    Vascular:    There was no swelling or varicose veins. The pulses and temperature are normal. There was no edema or tenderness.  Sensory:    Gross coordination and sensation were normal.    Knee Exam:    Left:    Inspection:  Abnormal    Palpation:  Abnormal    Stability:  stable    Tenderness:  lateral capsule    Range of Motion:       Flexion-Active: 115 degrees       Extension-Active: -10 degrees   Impression & Recommendations:  Problem # 1:  JOINT EFFUSION, KNEE (ICD-719.06) Assessment Deteriorated  left knee aspirate and inject   70 cc removed   Verbal consent was obtained. The left knee was prepped with alcohol and ethyl chloride. 1 cc of depomedrol 40mg /cc and 4 cc of lidocaine 1% was injected. there were no complications.  Orders: Est. Patient Level III (16109) Joint Aspirate / Injection, Large (20610) Depo- Medrol 40mg  (J1030)  Patient Instructions: 1)  You have received  an injection of cortisone today. You may experience increased pain at the injection site. Apply ice pack to the area for 20 minutes every 2 hours and take 2 xtra strength tylenol every 8 hours. This increased pain will usually resolve in 24 hours. The injection will take effect in 3-10 days.  2)  call us after medical clearance    Orders Added: 1)  Est. Patient Level III [60454] 2)  Joint Aspirate / Injection, Large [20610] 3)  Depo- Medrol 40mg  [J1030]

## 2010-05-08 ENCOUNTER — Ambulatory Visit (INDEPENDENT_AMBULATORY_CARE_PROVIDER_SITE_OTHER): Payer: Medicare Other | Admitting: Cardiology

## 2010-05-08 ENCOUNTER — Encounter: Payer: Self-pay | Admitting: Cardiology

## 2010-05-08 DIAGNOSIS — F17200 Nicotine dependence, unspecified, uncomplicated: Secondary | ICD-10-CM | POA: Insufficient documentation

## 2010-05-08 DIAGNOSIS — K5730 Diverticulosis of large intestine without perforation or abscess without bleeding: Secondary | ICD-10-CM | POA: Insufficient documentation

## 2010-05-08 DIAGNOSIS — K219 Gastro-esophageal reflux disease without esophagitis: Secondary | ICD-10-CM | POA: Insufficient documentation

## 2010-05-08 DIAGNOSIS — I10 Essential (primary) hypertension: Secondary | ICD-10-CM

## 2010-05-09 ENCOUNTER — Encounter: Payer: Self-pay | Admitting: Cardiology

## 2010-05-13 NOTE — Miscellaneous (Signed)
Summary: LABS BMP,LIPIDS,04/29/2010  Clinical Lists Changes  Observations: Added new observation of PSA: 0.43 ng/mL (04/29/2010 12:04) Added new observation of CALCIUM: 9.7 mg/dL (09/81/1914 78:29) Added new observation of CREATININE: 1.14 mg/dL (56/21/3086 57:84) Added new observation of BUN: 12 mg/dL (69/62/9528 41:32) Added new observation of BG RANDOM: 97 mg/dL (44/03/270 53:66) Added new observation of CO2 PLSM/SER: 31 meq/L (04/29/2010 12:04) Added new observation of CL SERUM: 104 meq/L (04/29/2010 12:04) Added new observation of K SERUM: 4.2 meq/L (04/29/2010 12:04) Added new observation of NA: 145 meq/L (04/29/2010 12:04) Added new observation of LDL: 85 mg/dL (44/05/4740 59:56) Added new observation of HDL: 52 mg/dL (38/75/6433 29:51) Added new observation of TRIGLYC TOT: 160 mg/dL (88/41/6606 30:16) Added new observation of CHOLESTEROL: 169 mg/dL (03/24/3233 57:32)

## 2010-05-22 NOTE — Assessment & Plan Note (Signed)
Summary: [facility]   Primary Provider:  Dr. Lilyan Punt   History of Present Illness: [name] is seen preoperatively at the kind request of Dr. Lilyan Punt prior to anticipated left TKA.  He underwent a right TKA less than one year ago and required only 3 days in the hospital, returning immediately home without substantial assistance other than that offered by his wife.  He denies any significant cardiac disease.  He was evaluated by a cardiologist at [facility] some years ago for assessment of a heart murmur.  Apparently, stress testing and echocardiography were negative.  He experiences no chest discomfort nor dyspnea and has good exercise tolerance.  His 40-pack-year history of cigarette smoking was discontinued 40 years ago with no symptoms of chronic obstructive pulmonary disease and mildly abnormal PFTs.  Preventive Screening-Counseling & Management  Alcohol-Tobacco     Smoking Status: current  Current Medications (verified): 1)  Torsemide 20 Mg Tabs (Torsemide) .... Take 1 Tablet By Mouth Once Daily 2)  Potassium Chloride Cr 10 Meq Cr-Tabs (Potassium Chloride) .... Take 1 Tablet By Mouth Two Times A Day 3)  Ranitidine Hcl 150 Mg Caps (Ranitidine Hcl) .... Take 1 Capsule Two Times A Day 4)  Norco 5-325 Mg Tabs (Hydrocodone-Acetaminophen) .... Take 1 To 2 Tablets By Mouth Every 4 Hrs. As Needed For Pain 5)  Baclofen 10 Mg Tabs (Baclofen) .... Take 1/2 Tablet By Mouth Three Times A Day As Needed 6)  Aspirin 81 Mg Tbec (Aspirin) .... Take One Tablet By Mouth Daily  Allergies (verified): 1)  ! Penicillin  Past History:  Family History: Last updated: 04/05/2008 Family History Coronary Heart Disease male < 48 Family History of Arthritis  Social History: Last updated: 04/05/2008 Patient is married.  works at hospital  Past Medical History: Heart murmur-evaluation by a cardiologist years ago was reportedly negative Hypertension Gastroesophageal  reflux disease Osteoarthritis Diverticulosis  Past Surgical History: Right TKA in 09/2009 Appendectomy-2007 Colonoscopy-2003 Right inguinal herniorrhaphy  CT Scan  Procedure date:  05/02/2004  Findings:       Clinical Data:  Mid to lower abdominal pain for two weeks.   CT SCAN OF THE ABDOMEN AND PELVIS WITH IV CONTRAST   The liver, spleen, pancreas, adrenal glands and kidneys demonstrate   no significant abnormality. There is a tiny cyst in the posterior   aspect of the right lobe of the liver and there are tiny cysts in the   upper poles of both kidneys, all unchanged since the prior exam of   06/07/01. There is some chronic thickening in the mucosa of the fundus   and antrum of the stomach and this appearance is essentially   unchanged since the prior exam. There is calcification in the   abdominal aorta without aneurysmal dilatation.   There are a few diverticula scattered throughout the colon.   IMPRESSION:   No significant abnormality of the abdomen.  No change since 06/07/01.   CT SCAN OF THE PELVIS WITH INTRAVENOUS CONTRAST:   The appendix and terminal ileum are well visualized and appear   normal.  There are numerous diverticula in the sigmoid portion of the   colon but there is no evidence of diverticulitis.  The prostate gland   is not enlarged.  No free fluid or other acute abnormality.   IMPRESSION:   Diverticulosis of the colon.  No acute abnormality.    Read By:  Gwynn Burly,  M.D.   EKG  Procedure date:  05/08/2010  Findings:      Normal sinus rhythm Nonspecific T wave abnormality Prominent QRS voltage No previous tracing for comparison.   Social History: Smoking Status:  current  Review of Systems       Patient has suffered syncope in the past; requires corrective lenses; upper and lower dentures; gastroesophageal reflux disease symptoms; pain in left knee with planned left TKA; intermittent ankle edema.  All other systems reviewed and are  negative.  Vital Signs:  Patient profile:   77 year old male Height:      71 inches Weight:      169 pounds BMI:     23.66 Pulse rate:   83 / minute BP sitting:   104 / 67  (left arm)  Vitals Entered ByLarita Fife Via LPN (May 08, 2010 10:45 AM)  Physical Exam  General:  Proportionate weight and height; well-developed; no acute distress: HEENT-[place]/AT; PERRL; bilateral arcus; EOM intact; conjunctiva and lids nl:  Neck-No JVD; no carotid bruits: Endocrine-No thyromegaly: Lungs-No tachypnea, clear without rales, rhonchi or wheezes; decreased breath sounds at the bases CV-normal PMI; normal S1 and S2; grade 1/6 systolic ejection murmur at the lower left sternal border Abdomen-BS normal; soft and non-tender without masses or organomegaly; smooth and nontender hepatic edge palpable at the right costal margin MS-No deformities, cyanosis or clubbing: Neurologic-Nl cranial nerves; symmetric strength and tone: Skin- Warm, no sig. lesions: Extremities-Nl distal pulses; trace edema    Impression & Recommendations:  Problem # 1:  HYPERTENSION (ICD-401.1) Despite a history of hypertension, blood pressure is excellent without any true antihypertensive agents.  He is receiving a low dose of a loop diuretic, which may be providing some blood pressure lowering.  Problem # 2:  KNEE, ARTHRITIS, DEGEN./OSTEO (ICD-715.96) Risk of perioperative complications as a result of the planned knee arthroplasty appears to be age appropriate or better.  He has good performance status, modest chronic disease and did very well with the same procedure on the contralateral knee.  I do not think that any special precautions are necessary nor is any preoperative testing required.  I will be more than happy to provide assistance to this nice gentleman in hospital if necessary.  Patient Instructions: 1)  Your physician recommends that you schedule a follow-up appointment in: as needed  Prevention & Chronic  Care Immunizations   Influenza vaccine: Not documented    Tetanus booster: Not documented    Pneumococcal vaccine: Not documented    H. zoster vaccine: Not documented  Colorectal Screening   Hemoccult: Not documented    Colonoscopy: Not documented  Other Screening   PSA: 0.43  (04/29/2010)   Smoking status: current  (05/08/2010)    Screening comments: quit smoking at age 67  Lipids   Total Cholesterol: 169  (04/29/2010)   LDL: 85  (04/29/2010)   LDL Direct: Not documented   HDL: 52  (04/29/2010)   Triglycerides: 160  (04/29/2010)  Hypertension   Last Blood Pressure: 104 / 67  (05/08/2010)   Serum creatinine: 1.14  (04/29/2010)   Serum potassium 4.2  (04/29/2010)  Self-Management Support :    Hypertension self-management support: Not documented

## 2010-05-26 LAB — BLOOD GAS, ARTERIAL
Acid-Base Excess: 3.9 mmol/L — ABNORMAL HIGH (ref 0.0–2.0)
FIO2: 0.21 %
O2 Saturation: 97 %
TCO2: 25.2 mmol/L (ref 0–100)
pO2, Arterial: 89.5 mmHg (ref 80.0–100.0)

## 2010-05-28 ENCOUNTER — Encounter: Payer: Self-pay | Admitting: Orthopedic Surgery

## 2010-05-31 LAB — CBC
HCT: 24.6 % — ABNORMAL LOW (ref 39.0–52.0)
Hemoglobin: 8.4 g/dL — ABNORMAL LOW (ref 13.0–17.0)
MCH: 33.5 pg (ref 26.0–34.0)
MCV: 98.1 fL (ref 78.0–100.0)
RBC: 2.51 MIL/uL — ABNORMAL LOW (ref 4.22–5.81)

## 2010-05-31 LAB — PROTIME-INR: Prothrombin Time: 21.5 seconds — ABNORMAL HIGH (ref 11.6–15.2)

## 2010-05-31 LAB — DIFFERENTIAL
Eosinophils Absolute: 0 10*3/uL (ref 0.0–0.7)
Eosinophils Relative: 0 % (ref 0–5)
Lymphs Abs: 0.7 10*3/uL (ref 0.7–4.0)
Monocytes Absolute: 1.4 10*3/uL — ABNORMAL HIGH (ref 0.1–1.0)
Monocytes Relative: 12 % (ref 3–12)

## 2010-06-01 LAB — CROSSMATCH

## 2010-06-01 LAB — CBC
MCH: 33.4 pg (ref 26.0–34.0)
MCH: 33.9 pg (ref 26.0–34.0)
MCHC: 34.1 g/dL (ref 30.0–36.0)
MCHC: 34.7 g/dL (ref 30.0–36.0)
Platelets: 127 10*3/uL — ABNORMAL LOW (ref 150–400)
Platelets: 129 10*3/uL — ABNORMAL LOW (ref 150–400)
Platelets: 172 10*3/uL (ref 150–400)
RBC: 2.71 MIL/uL — ABNORMAL LOW (ref 4.22–5.81)
RDW: 12.2 % (ref 11.5–15.5)
RDW: 12.2 % (ref 11.5–15.5)
RDW: 12.7 % (ref 11.5–15.5)

## 2010-06-01 LAB — APTT: aPTT: 28 seconds (ref 24–37)

## 2010-06-01 LAB — PROTIME-INR
INR: 0.99 (ref 0.00–1.49)
INR: 1.25 (ref 0.00–1.49)
INR: 1.82 — ABNORMAL HIGH (ref 0.00–1.49)
Prothrombin Time: 15.6 seconds — ABNORMAL HIGH (ref 11.6–15.2)

## 2010-06-01 LAB — BASIC METABOLIC PANEL
BUN: 13 mg/dL (ref 6–23)
BUN: 8 mg/dL (ref 6–23)
CO2: 33 mEq/L — ABNORMAL HIGH (ref 19–32)
Calcium: 8.4 mg/dL (ref 8.4–10.5)
Calcium: 9.2 mg/dL (ref 8.4–10.5)
Creatinine, Ser: 1.12 mg/dL (ref 0.4–1.5)
GFR calc Af Amer: >60 mL/min (ref >60)
GFR calc non Af Amer: 55 mL/min — ABNORMAL LOW (ref >60)
Glucose, Bld: 104 mg/dL — ABNORMAL HIGH (ref 70–99)
Sodium: 140 mEq/L (ref 135–145)

## 2010-06-01 LAB — DIFFERENTIAL
Basophils Absolute: 0 10*3/uL (ref 0.0–0.1)
Basophils Absolute: 0 10*3/uL (ref 0.0–0.1)
Basophils Absolute: 0 10*3/uL (ref 0.0–0.1)
Basophils Relative: 0 % (ref 0–1)
Basophils Relative: 0 % (ref 0–1)
Basophils Relative: 1 % (ref 0–1)
Eosinophils Absolute: 0.2 10*3/uL (ref 0.0–0.7)
Eosinophils Absolute: 0.2 10*3/uL (ref 0.0–0.7)
Eosinophils Relative: 2 % (ref 0–5)
Lymphs Abs: 0.7 10*3/uL (ref 0.7–4.0)
Neutro Abs: 3.3 10*3/uL (ref 1.7–7.7)
Neutro Abs: 9.2 10*3/uL — ABNORMAL HIGH (ref 1.7–7.7)
Neutrophils Relative %: 68 % (ref 43–77)
Neutrophils Relative %: 79 % — ABNORMAL HIGH (ref 43–77)
Neutrophils Relative %: 82 % — ABNORMAL HIGH (ref 43–77)

## 2010-06-01 LAB — ABO/RH: ABO/RH(D): A NEG

## 2010-06-03 NOTE — Letter (Signed)
Summary: Surgery clearance from  Dr. Dietrich Pates  Surgery clearance from  Dr. Dietrich Pates   Imported By: Jacklynn Ganong 05/28/2010 13:54:05  _____________________________________________________________________  External Attachment:    Type:   Image     Comment:   External Document

## 2010-06-03 NOTE — Letter (Signed)
Summary: Surgery  clearance from  Dr. Gerda Diss  Surgery  clearance from  Dr. Gerda Diss   Imported By: Jacklynn Ganong 05/28/2010 13:53:04  _____________________________________________________________________  External Attachment:    Type:   Image     Comment:   External Document

## 2010-06-04 ENCOUNTER — Encounter: Payer: Self-pay | Admitting: Orthopedic Surgery

## 2010-06-04 ENCOUNTER — Ambulatory Visit (INDEPENDENT_AMBULATORY_CARE_PROVIDER_SITE_OTHER): Payer: Medicare Other | Admitting: Orthopedic Surgery

## 2010-06-04 DIAGNOSIS — M1710 Unilateral primary osteoarthritis, unspecified knee: Secondary | ICD-10-CM

## 2010-06-04 DIAGNOSIS — IMO0002 Reserved for concepts with insufficient information to code with codable children: Secondary | ICD-10-CM

## 2010-06-04 NOTE — Progress Notes (Signed)
   Mr. Eckardt presents with osteoarthritis of LEFT knee, severe valgus deformity with severe flexion contracture, lateral, bone loss. Large posterior loose body.  Flexion is 100 flex contracture is 30   Poor gait   Lower leg edema   [place] left leg r/o DVT   Left ankle swelling   See h/p from hospital incorporated by ref

## 2010-06-04 NOTE — Patient Instructions (Addendum)
   Scheduled for surgery, LEFT total knee arthroplasty

## 2010-06-04 NOTE — Discharge Summary (Signed)
Separately identifiable x-ray report  AP lateral and patellofemoral view of LEFT knee  The alignment is Abnormal, he is in severe valgus. The lateral joint space was completely collapsed. Bone quality looks normal. There was a large, loose body in the posterior joint space. The patellofemoral joint was severely arthritic as well. This is a severe deformity.  Impression abnormal LEFT knee with bone loss on the tibial side. Severe valgus and multiple osteophytes.    Impression normal LEFT knee x-ray

## 2010-06-05 ENCOUNTER — Other Ambulatory Visit: Payer: Self-pay | Admitting: Orthopedic Surgery

## 2010-06-05 ENCOUNTER — Telehealth: Payer: Self-pay | Admitting: *Deleted

## 2010-06-05 ENCOUNTER — Telehealth: Payer: Self-pay | Admitting: Radiology

## 2010-06-05 ENCOUNTER — Telehealth: Payer: Self-pay | Admitting: Orthopedic Surgery

## 2010-06-05 ENCOUNTER — Encounter (HOSPITAL_COMMUNITY): Payer: Medicare Other | Attending: Orthopedic Surgery

## 2010-06-05 DIAGNOSIS — R52 Pain, unspecified: Secondary | ICD-10-CM

## 2010-06-05 DIAGNOSIS — R609 Edema, unspecified: Secondary | ICD-10-CM

## 2010-06-05 DIAGNOSIS — M7989 Other specified soft tissue disorders: Secondary | ICD-10-CM

## 2010-06-05 DIAGNOSIS — Z01812 Encounter for preprocedural laboratory examination: Secondary | ICD-10-CM | POA: Insufficient documentation

## 2010-06-05 DIAGNOSIS — M79606 Pain in leg, unspecified: Secondary | ICD-10-CM

## 2010-06-05 LAB — BASIC METABOLIC PANEL
BUN: 11 mg/dL (ref 6–23)
Calcium: 9.1 mg/dL (ref 8.4–10.5)
Chloride: 101 mEq/L (ref 96–112)
Creatinine, Ser: 1.21 mg/dL (ref 0.4–1.5)
GFR calc Af Amer: >60 mL/min (ref >60)

## 2010-06-05 LAB — PROTIME-INR
INR: 1.1 (ref 0.00–1.49)
Prothrombin Time: 14.4 seconds (ref 11.6–15.2)

## 2010-06-05 LAB — CBC
HCT: 37.1 % — ABNORMAL LOW (ref 39.0–52.0)
Hemoglobin: 12.3 g/dL — ABNORMAL LOW (ref 13.0–17.0)
MCH: 32.6 pg (ref 26.0–34.0)
MCHC: 33.2 g/dL (ref 30.0–36.0)
RDW: 12.1 % (ref 11.5–15.5)

## 2010-06-05 LAB — DIFFERENTIAL
Basophils Absolute: 0.1 10*3/uL (ref 0.0–0.1)
Basophils Relative: 1 % (ref 0–1)
Eosinophils Relative: 4 % (ref 0–5)
Lymphocytes Relative: 28 % (ref 12–46)
Monocytes Absolute: 0.7 10*3/uL (ref 0.1–1.0)
Monocytes Relative: 14 % — ABNORMAL HIGH (ref 3–12)
Neutro Abs: 2.7 10*3/uL (ref 1.7–7.7)

## 2010-06-05 NOTE — Telephone Encounter (Signed)
Call to insurer [employer] [phone] re: pre-authorization for in-patient surgery, CPT [phone], dx 715.16, 715.96, scheduled for 06/09/10 at [facility].  Per Patsy Lager, phone pre-auth's are not issued.  Required to Fax clinicals. Done. Sent to: [phone], Attn: [facility] Services.

## 2010-06-05 NOTE — Telephone Encounter (Signed)
Called patient and advised him of his ultrasound.

## 2010-06-05 NOTE — Telephone Encounter (Signed)
Made order for venous doppler left lower leg

## 2010-06-06 ENCOUNTER — Ambulatory Visit (HOSPITAL_COMMUNITY)
Admission: RE | Admit: 2010-06-06 | Discharge: 2010-06-06 | Disposition: A | Discharge status: Home / Self Care | Payer: Medicare Other | Source: Ambulatory Visit | Attending: Orthopedic Surgery | Admitting: Orthopedic Surgery

## 2010-06-06 ENCOUNTER — Telehealth: Payer: Self-pay | Admitting: Orthopedic Surgery

## 2010-06-06 DIAGNOSIS — M7989 Other specified soft tissue disorders: Secondary | ICD-10-CM | POA: Insufficient documentation

## 2010-06-06 DIAGNOSIS — R609 Edema, unspecified: Secondary | ICD-10-CM

## 2010-06-06 DIAGNOSIS — M79609 Pain in unspecified limb: Secondary | ICD-10-CM | POA: Insufficient documentation

## 2010-06-06 DIAGNOSIS — R52 Pain, unspecified: Secondary | ICD-10-CM

## 2010-06-06 NOTE — Telephone Encounter (Signed)
Pre-authorization received as noted.

## 2010-06-09 ENCOUNTER — Other Ambulatory Visit: Payer: Self-pay | Admitting: *Deleted

## 2010-06-09 ENCOUNTER — Inpatient Hospital Stay (HOSPITAL_COMMUNITY)
Admission: RE | Admit: 2010-06-09 | Discharge: 2010-06-13 | DRG: 470 | Disposition: A | Discharge status: Home Health | Payer: Medicare Other | Source: Ambulatory Visit | Attending: Orthopedic Surgery | Admitting: Orthopedic Surgery

## 2010-06-09 ENCOUNTER — Ambulatory Visit (HOSPITAL_COMMUNITY): Payer: Medicare Other

## 2010-06-09 ENCOUNTER — Other Ambulatory Visit: Payer: Self-pay | Admitting: Orthopedic Surgery

## 2010-06-09 DIAGNOSIS — IMO0002 Reserved for concepts with insufficient information to code with codable children: Principal | ICD-10-CM | POA: Diagnosis present

## 2010-06-09 DIAGNOSIS — M1710 Unilateral primary osteoarthritis, unspecified knee: Secondary | ICD-10-CM

## 2010-06-09 DIAGNOSIS — I10 Essential (primary) hypertension: Secondary | ICD-10-CM | POA: Diagnosis present

## 2010-06-09 DIAGNOSIS — J4489 Other specified chronic obstructive pulmonary disease: Secondary | ICD-10-CM | POA: Diagnosis present

## 2010-06-09 DIAGNOSIS — J449 Chronic obstructive pulmonary disease, unspecified: Secondary | ICD-10-CM | POA: Diagnosis present

## 2010-06-09 DIAGNOSIS — K219 Gastro-esophageal reflux disease without esophagitis: Secondary | ICD-10-CM | POA: Diagnosis present

## 2010-06-09 DIAGNOSIS — D62 Acute posthemorrhagic anemia: Secondary | ICD-10-CM | POA: Diagnosis not present

## 2010-06-09 NOTE — H&P (Addendum)
[name], [name]           ACCOUNT NO.:  [id number]  MEDICAL RECORD NO.:  [id number]           PATIENT TYPE:  O  LOCATION:  DOIB                          FACILITY:  [facility]  PHYSICIAN:  Vickki Hearing, M.D.DATE OF BIRTH:  Jan 01, 1926  DATE OF ADMISSION:  06/05/2010 DATE OF DISCHARGE:  LH                             HISTORY & PHYSICAL   CHIEF COMPLAINT:  Left knee pain.  This is an 77 year old male who is followed by Dr. Lilyan Punt and Dr. Dietrich Pates, who is status post right total knee arthroplasty in July 2011, has a family history of coronary artery disease, arthritis, heart murmur which was evaluated by Cardiology and found to be not symptomatic, hypertension, gastroesophageal reflux, diverticulosis.  He has also had an appendectomy in 2007, a colonoscopy in 2003, and he had repair of a right inguinal herniorrhaphy.  He was recently cleared by Cardiology and his medical doctor reports are in his record.  He presents with severe disabling left knee pain, severe deformity, recurrent large effusions, decreased gait tolerance, poor function in terms of activities of daily living, and presents for a left total knee arthroplasty with a DePuy total knee system.  My last listed notes from his primary care physician indicate that he is on torsemide 20 mg daily, potassium 10 mEq twice a day, ranitidine 150 mg twice a day, baclofen 10 mg 1/2 tablet 3 times daily as needed.  We stopped his aspirin and he takes Norco 5/325 mg for pain.  He lists allergies to PENICILLIN.  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 as a Engineer, civil (consulting).  He did tolerate his last knee replacement well.  REVIEW OF SYSTEMS:  The patient is not complaining of any weight loss, blurred vision, nosebleed, chest pain, shortness of breath, heartburn, frequency, skin changes, poor healing, tingling, nervousness, easy bleeding, excessive thirst, or  reaction to food or environment.  He does complain of catching and locking of his left knee.  PHYSICAL EXAMINATION:  GENERAL:  Reveals a well-developed, well- nourished thin male who is oriented x3.  His mood and affect are pleasant. MUSCULOSKELETAL:  His gait and station are remarkable for severe valgus deformity, severe limp, decreased stance phase, decreased cadence, decreased stride length, poor knee extension.  His right and left upper extremity looked to be normal and are nontender.  Range of motion is normal.  Stability is normal.  Strength is normal.  Skin is normal. Right knee shows a midline surgical scar, excellent healing, no joint effusion or tenderness.  Flexion of 110 degrees with about 5-degree loss of extension.  Stability tests are normal.  Strength is normal.  Skin is as stated.  The left knee is in severe valgus tenderness and the lateral compartment catching and locking with flexion of the knee.  Its range of motion is 30 degrees - 100 degrees with 30-degree flexion contracture. The knee is otherwise stable.  Strength is normal.  Skin is intact.  He has normal dorsalis pedis and posterior tibial pulses with normal temperature, but he does have edema in the left lower  extremity.  I think it is secondary to the large left knee effusion but he is sent for DVT evaluation with ultrasound.  Lymph nodes are negative.  Sensation is normal.  His reflexes are normal with no pathologic findings and his coordination and balance are normal except his balance is poor secondary to his knee flexion, contracture, valgus, and pain.  IMPRESSION:  X-rays were taken.  These showed severe valgus deformity and multiple loose bodies, complete collapse of the lateral joint space.  The plan is for a left total knee arthroplasty.  The informed consent process was completed in the office.  The patient having undergone this procedure does seem to have an understanding of the risks and  benefits of the procedure including nonoperative treatment.  Those risks include but are not limited to anesthetic death, bleeding, infection, pain, flexion, contracture, infection requiring multiple procedures, amputation, blood clots, pulmonary embolus, wound infection.  Again, ultrasound is pending, clearance prior to surgery.     Vickki Hearing, M.D.     SEH/MEDQ  D:  06/05/2010  T:  06/05/2010  Job:  [id number]  Electronically Signed by [name] M.D. on 06/09/2010 01:40:04 PM

## 2010-06-10 ENCOUNTER — Ambulatory Visit: Payer: Medicare Other | Admitting: Orthopedic Surgery

## 2010-06-10 LAB — CBC
HCT: 28.3 % — ABNORMAL LOW (ref 39.0–52.0)
MCHC: 32.5 g/dL (ref 30.0–36.0)
MCV: 99 fL (ref 78.0–100.0)
RDW: 12.1 % (ref 11.5–15.5)

## 2010-06-10 LAB — DIFFERENTIAL
Eosinophils Relative: 2 % (ref 0–5)
Lymphocytes Relative: 11 % — ABNORMAL LOW (ref 12–46)
Lymphs Abs: 0.9 10*3/uL (ref 0.7–4.0)
Monocytes Absolute: 1 10*3/uL (ref 0.1–1.0)
Monocytes Relative: 12 % (ref 3–12)

## 2010-06-10 LAB — BASIC METABOLIC PANEL
CO2: 26 mEq/L (ref 19–32)
Calcium: 8.4 mg/dL (ref 8.4–10.5)
GFR calc non Af Amer: 60 mL/min — ABNORMAL LOW (ref >60)

## 2010-06-11 LAB — DIFFERENTIAL
Basophils Absolute: 0 10*3/uL (ref 0.0–0.1)
Basophils Relative: 0 % (ref 0–1)
Eosinophils Relative: 1 % (ref 0–5)
Monocytes Absolute: 1.6 10*3/uL — ABNORMAL HIGH (ref 0.1–1.0)

## 2010-06-11 LAB — CBC
HCT: 25.9 % — ABNORMAL LOW (ref 39.0–52.0)
MCH: 33.1 pg (ref 26.0–34.0)
MCHC: 33.6 g/dL (ref 30.0–36.0)
RDW: 12.3 % (ref 11.5–15.5)

## 2010-06-11 LAB — BASIC METABOLIC PANEL
Calcium: 8.4 mg/dL (ref 8.4–10.5)
Creatinine, Ser: 1.59 mg/dL — ABNORMAL HIGH (ref 0.4–1.5)
GFR calc non Af Amer: 42 mL/min — ABNORMAL LOW (ref >60)
Glucose, Bld: 117 mg/dL — ABNORMAL HIGH (ref 70–99)
Sodium: 135 mEq/L (ref 135–145)

## 2010-06-12 ENCOUNTER — Other Ambulatory Visit: Payer: Self-pay | Admitting: *Deleted

## 2010-06-12 DIAGNOSIS — M25569 Pain in unspecified knee: Secondary | ICD-10-CM

## 2010-06-12 DIAGNOSIS — R42 Dizziness and giddiness: Secondary | ICD-10-CM

## 2010-06-12 LAB — BASIC METABOLIC PANEL
Calcium: 8.3 mg/dL — ABNORMAL LOW (ref 8.4–10.5)
GFR calc Af Amer: 52 mL/min — ABNORMAL LOW (ref >60)
GFR calc non Af Amer: 43 mL/min — ABNORMAL LOW (ref >60)
Potassium: 3.7 mEq/L (ref 3.5–5.1)
Sodium: 138 mEq/L (ref 135–145)

## 2010-06-12 LAB — DIFFERENTIAL
Basophils Absolute: 0 10*3/uL (ref 0.0–0.1)
Basophils Relative: 0 % (ref 0–1)
Eosinophils Absolute: 0.5 10*3/uL (ref 0.0–0.7)
Eosinophils Relative: 4 % (ref 0–5)
Lymphocytes Relative: 6 % — ABNORMAL LOW (ref 12–46)

## 2010-06-12 LAB — CROSSMATCH
ABO/RH(D): A NEG
Antibody Screen: NEGATIVE
Unit division: 0

## 2010-06-12 LAB — CBC
HCT: 26.6 % — ABNORMAL LOW (ref 39.0–52.0)
MCHC: 34.6 g/dL (ref 30.0–36.0)
Platelets: 108 10*3/uL — ABNORMAL LOW (ref 150–400)
RDW: 17.8 % — ABNORMAL HIGH (ref 11.5–15.5)
WBC: 11.7 10*3/uL — ABNORMAL HIGH (ref 4.0–10.5)

## 2010-06-12 MED ORDER — HYDROCODONE-ACETAMINOPHEN 5-325 MG PO TABS: 1.0000 | ORAL_TABLET | ORAL | Status: DC | PRN

## 2010-06-13 ENCOUNTER — Inpatient Hospital Stay (HOSPITAL_COMMUNITY): Payer: Medicare Other

## 2010-06-17 ENCOUNTER — Telehealth: Payer: Self-pay | Admitting: Orthopedic Surgery

## 2010-06-17 ENCOUNTER — Encounter: Payer: Self-pay | Admitting: Orthopedic Surgery

## 2010-06-17 ENCOUNTER — Ambulatory Visit (INDEPENDENT_AMBULATORY_CARE_PROVIDER_SITE_OTHER): Payer: Medicare Other | Admitting: Orthopedic Surgery

## 2010-06-17 DIAGNOSIS — M25569 Pain in unspecified knee: Secondary | ICD-10-CM

## 2010-06-17 MED ORDER — METHOCARBAMOL 500 MG PO TABS: 500.0000 mg | ORAL_TABLET | Freq: Four times a day (QID) | ORAL | Status: AC

## 2010-06-17 NOTE — Telephone Encounter (Signed)
States LT knee hurting through night, no swelling; a little better this morning; states taking pain medication; said had home therapy yesterday - scheduled post op appointment is 06/24/10, come in?  patient ph [phone]

## 2010-06-17 NOTE — Progress Notes (Signed)
Postop visit #1 postop day #8 status post LEFT total knee arthroplasty with severe valgus deformity and flexion contracture. Presents with pain and swelling.  Negative Homans sign. 200 and he tends to hold the knee in flexion.I did some passive and active assisted trenching in his knee, and we'll get him to approximately 15 shy of full extension and  He ambulates with the tip to consider heel to toe except when encouraged to do so, which we did.  Staples, on the 10th. We've called about his CPM machine and ostium on the 10th of with some passive stretching out her staples, and we'll see him in the office every other day to help regain his extension.

## 2010-06-17 NOTE — Telephone Encounter (Signed)
I called patient he will come in today if he can get a ride, advised that Dr will be leaving for out of town after today

## 2010-06-22 LAB — BASIC METABOLIC PANEL
CO2: 27 mEq/L (ref 19–32)
Calcium: 9.2 mg/dL (ref 8.4–10.5)
Chloride: 104 mEq/L (ref 96–112)
GFR calc Af Amer: >60 mL/min (ref >60)
Potassium: 3.6 mEq/L (ref 3.5–5.1)
Sodium: 138 mEq/L (ref 135–145)

## 2010-06-22 LAB — CBC
Hemoglobin: 12.1 g/dL — ABNORMAL LOW (ref 13.0–17.0)
MCHC: 34.1 g/dL (ref 30.0–36.0)
RBC: 3.56 MIL/uL — ABNORMAL LOW (ref 4.22–5.81)

## 2010-06-22 LAB — DIFFERENTIAL
Basophils Relative: 1 % (ref 0–1)
Monocytes Absolute: 0.6 10*3/uL (ref 0.1–1.0)
Monocytes Relative: 10 % (ref 3–12)
Neutro Abs: 4.4 10*3/uL (ref 1.7–7.7)

## 2010-06-24 ENCOUNTER — Ambulatory Visit (INDEPENDENT_AMBULATORY_CARE_PROVIDER_SITE_OTHER): Payer: Medicare Other | Admitting: Orthopedic Surgery

## 2010-06-24 DIAGNOSIS — M1710 Unilateral primary osteoarthritis, unspecified knee: Secondary | ICD-10-CM

## 2010-06-24 DIAGNOSIS — Z96659 Presence of unspecified artificial knee joint: Secondary | ICD-10-CM

## 2010-06-24 DIAGNOSIS — IMO0002 Reserved for concepts with insufficient information to code with codable children: Secondary | ICD-10-CM

## 2010-06-24 NOTE — Patient Instructions (Signed)
Continue the stocking   Continue propping the leg up 15 min 4 x a day   Take strips off in 7 days

## 2010-06-24 NOTE — Progress Notes (Signed)
Followup knee replacement.  Date of surgery March 26.  Postop day 14  Much improved. He wasn't taking his blood pressure medicine, which is making his legs well. We will resume today.  He is wearing her TED hose is doing his exercises. He is walking a little bit better and putting his foot on the ground.  Showed him his x-ray from surgery, indicating his leg was fully extended.  His incision looks good. Staples are removed Steri-Strips applied.  Continue Robaxin, passive stretching with gravity assisted.  His therapy, CPM, followup in one week check. Extension again. We did do some passive extension today.

## 2010-07-02 ENCOUNTER — Ambulatory Visit (INDEPENDENT_AMBULATORY_CARE_PROVIDER_SITE_OTHER): Payer: Medicare Other | Admitting: Orthopedic Surgery

## 2010-07-02 DIAGNOSIS — Z96659 Presence of unspecified artificial knee joint: Secondary | ICD-10-CM

## 2010-07-02 MED ORDER — CIPROFLOXACIN HCL 500 MG PO TABS: 500.0000 mg | ORAL_TABLET | Freq: Two times a day (BID) | ORAL | Status: AC

## 2010-07-02 NOTE — Progress Notes (Signed)
Postop visit.  Surgery 326 2012 LEFT total knee replacement for severe valgus deformity with flexion contracture.  Therapy notes indicate patient's range of motion is now 10 fashion 115.  Over the midportion of the wound here is a slight opening of the epidermis, but no sinus tract.  No joint effusion.  He is to continue physical therapy, progressed to outpatient therapy and followup in 2 weeks for me to recheck the area of the incision.  We started Cipro 500 mg b.i.d.  Neosporin bid

## 2010-07-08 NOTE — Op Note (Signed)
NAMEKEE, DRUDGE           ACCOUNT NO.:  [id number]  MEDICAL RECORD NO.:  [id number]           PATIENT TYPE:  I  LOCATION:  A314                          FACILITY:  [facility]  PHYSICIAN:  Vickki Hearing, M.D.DATE OF BIRTH:  [birth year]  DATE OF PROCEDURE:  06/09/2010 DATE OF DISCHARGE:                              OPERATIVE REPORT   HISTORY:  An 77 year old male with disabling left knee pain, severe valgus osteoarthritis flexion contracture presented for left total knee replacement.  PREOPERATIVE DIAGNOSIS:  Osteoarthritis, left knee.  POSTOPERATIVE DIAGNOSIS:  Osteoarthritis, left knee.  PROCEDURE:  Left total knee arthroplasty.  SURGEON:  Vickki Hearing, MD.  ANESTHETIC:  Spinal.  Assisted by [place].  OPERATIVE FINDINGS:  Severe valgus deformity with flexion contracture of 15 degrees severe wear of the lateral compartment with severe tibial bone loss and invagination of the lateral femoral condyle into the proximal lateral tibia, posterior condylar deficiency, severe lateral soft tissue contracture.  There was a large synovial proliferation which was sent for specimen.  IMPLANT SIZES:  We used a size 5 femur, size 5 tibia, size 15 polyethylene insert and a size 38 patella.  We used a DePuy Sigma posterior stabilized fixed bearing implant.  DETAILS OF PROCEDURE:  Site marking was performed in the preop area over the left knee countersigned by me, the surgeon, chart was updated.  The patient was started on vancomycin, taken to surgery for spinal anesthetic which was put in successfully with no complications.  Foley catheter was inserted.  The patient was placed supine.  The left thigh was wrapped with a tourniquet.  The left leg was prepped and draped sterilely and then the time-out was executed.  The knee was exsanguinated with a 6-inch Esmarch and placed in flexion and the tourniquet was elevated.  A midline incision was made. Subcutaneous tissue  was divided. A medial arthrotomy was performed. Medial soft tissue release was performed to the mid coronal plane at the joint line and then the same was done on the lateral side.  The patella was everted.  The patellofemoral ligament was released.  The ACL and PCL which were still present were resected.  The medial meniscus was resected.  The lateral meniscus was deficient.  Sclerotic bone was fine on the lateral femoral condyle distal surface as well as the proximal tibia with invagination of the bone into the proximal tibia.  There was posterior condylar deficiency.  A 5-degree left 10-mm resection was set and the canal was opened and a guide rod was passed after suction and irrigation was done.  We resected 10 mm distal bone then had resected additional 2 mm of distal femur, sized the femur to a size 5 using the AP axis as the rotational control as the posterior condyle was deficient.  We checked the posterior condylar cuts and the medial side was much thicker than the lateral side ensuring that the cut was in properrotation.  There was no notching.  The proximal tibial cut was then set for an 8-mm bone resection from the proximal medial tibia.  We set the cut for neutral with a slight posterior slope.  After making the proximal tibial resection, we placed a spacer block and the knee was tight in flexion and extension, especially on the lateral side.  We then made the notch cut and placed the trial components with a 10-mm insert.  We released the popliteus tendon and the lateral collateral ligament and capsular soft tissue from the distal femur until the flexion gap opened and then we released the posterior capsule and tested the knee in extension it was still tight, we then released the iliotibial band with the piecrust technique and from its tibial attachment until we were able to get the spacer block and in extension and balance the knee in extension.  We then trialed a  10, 12.5, and 15 mm inserts and the 15-mm insert fit best.  We set the tibial rotation, punched the tibia, resected the patella from 27 down to this 15 mm placed a 38 three peg cutting guide, drilled those three holes and then irrigated the knee and dried the bone and prepared the cement.  We cemented the implants in place, removed any excess cement and then placed a 15-mm polyethylene insert.  We tested the knee in flexion and extension, again the knee was stable in flexion and extension.  The patella had a slight subluxation so we did a medial reefing and a slight, but minimal lateral release.  The knee was irrigated, washed and injected with 30 mL of Marcaine in the soft tissues.  The extensor mechanism was closed with #1 Bralon in interrupted and running fashion with the knee in flexion.  We then closed the subcu over a pain pump catheter with O Monocryl in running fashion.  We injected the capsule in joint with 30 mL of Marcaine with epinephrine.  We placed a sterile dressing, took an x-ray.  X-ray shows that the implant is an acceptable alignment, extension has been obtained.  The leg was then wrapped with Ace bandages and cryo cuff.  The patient was taken to recovery room in stable condition.  The patient will be placed in a knee immobilizer to maintain his extension as he tends to like to flex his knee after surgery, which we noted from his last right total knee.     Vickki Hearing, M.D.     SEH/MEDQ  D:  06/09/2010  T:  06/10/2010  Job:  [id number]  Electronically Signed by [name] M.D. on 07/08/2010 08:39:25 AM

## 2010-07-08 NOTE — Discharge Summary (Signed)
  NAMESALEM, [name]           ACCOUNT NO.:  [id number]  MEDICAL RECORD NO.:  [id number]           PATIENT TYPE:  I  LOCATION:  A314                          FACILITY:  [facility]  PHYSICIAN:  Vickki Hearing, M.D.DATE OF BIRTH:  1925/04/06  DATE OF ADMISSION:  06/09/2010 DATE OF DISCHARGE:  03/30/2012LH                              DISCHARGE SUMMARY   ADMITTING DIAGNOSIS:  Osteoarthritis of the left knee.  DISCHARGE DIAGNOSIS:  Osteoarthritis of the left knee.  SECONDARY DIAGNOSIS:  Acute blood loss anemia from surgery; comorbidities, coronary artery disease.  PROCEDURE:  Left total knee arthroplasty.  IMPLANT:  DePuy fixed bearing posterior stabilized sigma implant with a size 5 femur, a size 5 tibia, a size 15 posterior stabilized polyethylene insert, and a size 38 patella.  OPERATIVE FINDINGS:  Severe valgus deformity with flexion contracture 15 degrees, severe wear of the lateral compartment, tibial bone loss and invagination on the lateral femoral condyle into the proximal lateral tibia with severe bone loss there, posterior condylar deficiency lateral, and severe lateral soft tissue contracture.  Synovial specimen was sent, which showed hemosiderin labeled synovial tissue with hypertrophy, no pathologic disease.  This is the second knee replacement for [name].  He had his right total knee done last year, presented for his left total knee this time with severe flexion and valgus deformity.  He tolerated the procedure well.  Postop course was remarkable for hemoglobin of 8.7, transfused 2 units, discharge hemoglobin is 9.2.  On his third postop day, he was able to ambulate, stand, and walk, and we decided that he was able to go home.  He did have a temperature of 101.2 and he will get a chest x-ray and some respiratory treatments and be sent home with incentive spirometer and monitoring of his temperature.  His incision looks good.  He has some mild  swelling of his limb.  No calf pain.  No calf tenderness.  Negative Homans' sign.  His flexion is 90 degrees.  His extension is -8 degrees.  He will need excessive work on extension.  I have come in the last 3 days to help passively stretch his knee.  He does not tolerate knee immobilizer well as his knee just stays in flexion.  This will require extensive therapy.  He is discharged on Ecotrin 325 twice a day for the balance of [redacted] weeks along with TED hose.  He will take methocarbamol 500 mg q.6 h. p.r.n. for spasms, hydrocodone 1 tablet by mouth q.4 p.r.n. for pain, senna and docusate sodium 1 tablet daily at bedtime as needed, hydrochlorothiazide 25 mg daily, K-Dur 10 mEq twice daily, torsemide one daily 20 mg, and Zantac 150 mg daily.  He will follow up on 10.  His staples come out on the 10.  [facility] is with [facility] with a CPM machine in a standard total knee protocol.     Vickki Hearing, M.D.     SEH/MEDQ  D:  06/13/2010  T:  06/14/2010  Job:  [id number]  Electronically Signed by [name] M.D. on 07/08/2010 08:39:19 AM

## 2010-07-09 ENCOUNTER — Ambulatory Visit (HOSPITAL_COMMUNITY)
Admission: RE | Admit: 2010-07-09 | Discharge: 2010-07-09 | Disposition: A | Discharge status: Home / Self Care | Payer: Medicare Other | Source: Ambulatory Visit | Attending: Family Medicine | Admitting: Family Medicine

## 2010-07-09 DIAGNOSIS — IMO0001 Reserved for inherently not codable concepts without codable children: Secondary | ICD-10-CM | POA: Insufficient documentation

## 2010-07-09 DIAGNOSIS — M25569 Pain in unspecified knee: Secondary | ICD-10-CM | POA: Insufficient documentation

## 2010-07-09 DIAGNOSIS — M25669 Stiffness of unspecified knee, not elsewhere classified: Secondary | ICD-10-CM | POA: Insufficient documentation

## 2010-07-09 DIAGNOSIS — M6281 Muscle weakness (generalized): Secondary | ICD-10-CM | POA: Insufficient documentation

## 2010-07-09 DIAGNOSIS — R262 Difficulty in walking, not elsewhere classified: Secondary | ICD-10-CM | POA: Insufficient documentation

## 2010-07-11 ENCOUNTER — Ambulatory Visit (HOSPITAL_COMMUNITY)
Admission: RE | Admit: 2010-07-11 | Discharge: 2010-07-11 | Disposition: A | Discharge status: Home / Self Care | Payer: Medicare Other | Source: Ambulatory Visit | Attending: Family Medicine | Admitting: Family Medicine

## 2010-07-15 ENCOUNTER — Ambulatory Visit (HOSPITAL_COMMUNITY)
Admission: RE | Admit: 2010-07-15 | Discharge: 2010-07-15 | Disposition: A | Discharge status: Home / Self Care | Payer: Medicare Other | Source: Ambulatory Visit | Attending: Family Medicine | Admitting: Family Medicine

## 2010-07-15 DIAGNOSIS — IMO0001 Reserved for inherently not codable concepts without codable children: Secondary | ICD-10-CM | POA: Insufficient documentation

## 2010-07-15 DIAGNOSIS — M25569 Pain in unspecified knee: Secondary | ICD-10-CM | POA: Insufficient documentation

## 2010-07-15 DIAGNOSIS — R262 Difficulty in walking, not elsewhere classified: Secondary | ICD-10-CM | POA: Insufficient documentation

## 2010-07-15 DIAGNOSIS — M6281 Muscle weakness (generalized): Secondary | ICD-10-CM | POA: Insufficient documentation

## 2010-07-15 DIAGNOSIS — M25669 Stiffness of unspecified knee, not elsewhere classified: Secondary | ICD-10-CM | POA: Insufficient documentation

## 2010-07-16 ENCOUNTER — Ambulatory Visit (INDEPENDENT_AMBULATORY_CARE_PROVIDER_SITE_OTHER): Payer: Medicare Other | Admitting: Orthopedic Surgery

## 2010-07-16 DIAGNOSIS — Z96659 Presence of unspecified artificial knee joint: Secondary | ICD-10-CM

## 2010-07-16 NOTE — Patient Instructions (Signed)
Start bed hangs 30 minutes 4 times a day

## 2010-07-16 NOTE — Progress Notes (Signed)
DOS 3.26.2012  Postop visit for reevaluation status post LEFT total knee after valgus. Flexion contracture. Still has approximately 30 of flexion contracture with 20 with passive motion. Recommend prone hangs, which I instructed him on how to do. Recommend jazz splint  Followup in one month

## 2010-07-17 ENCOUNTER — Ambulatory Visit (HOSPITAL_COMMUNITY)
Admission: RE | Admit: 2010-07-17 | Discharge: 2010-07-17 | Disposition: A | Discharge status: Home / Self Care | Payer: Medicare Other | Source: Ambulatory Visit | Attending: Family Medicine | Admitting: Family Medicine

## 2010-07-17 ENCOUNTER — Ambulatory Visit (HOSPITAL_COMMUNITY): Payer: Medicare Other | Admitting: *Deleted

## 2010-07-18 ENCOUNTER — Ambulatory Visit (HOSPITAL_COMMUNITY)
Admission: RE | Admit: 2010-07-18 | Discharge: 2010-07-18 | Disposition: A | Discharge status: Home / Self Care | Payer: Medicare Other | Source: Ambulatory Visit | Attending: Family Medicine | Admitting: Family Medicine

## 2010-07-23 ENCOUNTER — Ambulatory Visit (HOSPITAL_COMMUNITY): Payer: Medicare Other | Admitting: *Deleted

## 2010-07-25 ENCOUNTER — Ambulatory Visit (HOSPITAL_COMMUNITY)
Admission: RE | Admit: 2010-07-25 | Discharge: 2010-07-25 | Disposition: A | Discharge status: Home / Self Care | Payer: Medicare Other | Source: Ambulatory Visit | Attending: Family Medicine | Admitting: Family Medicine

## 2010-07-28 ENCOUNTER — Ambulatory Visit (HOSPITAL_COMMUNITY)
Admission: RE | Admit: 2010-07-28 | Discharge: 2010-07-28 | Disposition: A | Discharge status: Home / Self Care | Payer: Medicare Other | Source: Ambulatory Visit | Attending: Family Medicine | Admitting: Family Medicine

## 2010-07-30 ENCOUNTER — Ambulatory Visit (HOSPITAL_COMMUNITY)
Admission: RE | Admit: 2010-07-30 | Discharge: 2010-07-30 | Disposition: A | Discharge status: Home / Self Care | Payer: Medicare Other | Source: Ambulatory Visit | Attending: Family Medicine | Admitting: Family Medicine

## 2010-08-01 ENCOUNTER — Ambulatory Visit (HOSPITAL_COMMUNITY)
Admission: RE | Admit: 2010-08-01 | Discharge: 2010-08-01 | Disposition: A | Discharge status: Home / Self Care | Payer: Medicare Other | Source: Ambulatory Visit | Attending: Family Medicine | Admitting: Family Medicine

## 2010-08-01 NOTE — Op Note (Signed)
NAMERAYMON, SCHLARB           ACCOUNT NO.:  [id number]   MEDICAL RECORD NO.:  [id number]          PATIENT TYPE:  OBV   LOCATION:  A318                          FACILITY:  [facility]   PHYSICIAN:  Dalia Heading, M.D.  DATE OF BIRTH:  09/28/1925   DATE OF PROCEDURE:  09/11/2005  DATE OF DISCHARGE:  09/12/2005                                 OPERATIVE REPORT   PREOPERATIVE DIAGNOSIS:  Acute appendicitis.   POSTOPERATIVE DIAGNOSIS:  Acute appendicitis.   PROCEDURE:  Laparoscopic appendectomy.   SURGEON:  Dalia Heading, M.D.   ANESTHESIA:  General endotracheal.   INDICATIONS:  The patient is a 77 year old black male who was found on CT  scan of the abdomen and pelvis to have acute appendicitis.  The risks and  benefits of the procedure including bleeding, infection, and the possibility  of an open procedure were fully explained to the patient, who gave informed  consent.   PROCEDURE NOTE:  The patient was placed in the supine position.  After  induction of general endotracheal anesthesia, the abdomen was prepped and  draped using the usual sterile technique with Betadine.  Surgical site  confirmation was performed.   A supraumbilical incision was made down to fascia.  A Veress needle was  introduced into the abdominal cavity and confirmation of placement was done  using the saline drop test.  The abdomen was then insufflated to 16 mmHg  pressure.  An 11-mm trocar was introduced into the abdominal cavity under  direct visualization without difficulty.  The patient was placed in deeper  Trendelenburg position; and an additional 11-mm trocar was placed in the  suprapubic region;  and a  5-mm trocar was placed in the left lower quadrant  region.  The appendix was visualized and noted to be mildly injected.  The  distal ileum was inspected, and noted to be within normal limits.  No  Meckel's diverticulum was noted.  The cecum appeared to be within normal  limits.  The mesoappendix  was divided using the harmonic scalpel.  The  vascular Endo-GIA was placed across the base of the appendix and fired.  The  appendix was then removed using EndoCatch bag.  The staple line was  inspected and noted to be within normal limits.  All fluid and air were  evacuated from the abdominal cavity prior to removal of the trocars.   All wounds were irrigated with normal saline.  All wounds were injected with  0.5% Sensorcaine.  The supraumbilical fascia as well as suprapubic fascia  were reapproximated using #0 Vicryl interrupted sutures.  All skin incisions  were closed using staples.  Betadine ointment and dry sterile dressings were  applied.   All tape and needle counts were correct at the end of the procedure.  The  patient was extubated in the operating room and went back to recovery room  awake in stable condition.   COMPLICATIONS:  None.   SPECIMEN:  Appendix.   BLOOD LOSS:  Minimal.      Dalia Heading, M.D.  Electronically Signed     MAJ/MEDQ  D:  09/11/2005  T:  09/12/2005  Job:  [id number]   cc:   Madelin Rear. Sherwood Gambler, MD  Fax: [phone]

## 2010-08-01 NOTE — H&P (Signed)
   NAME:  [name]                     ACCOUNT NO.:   MEDICAL RECORD NO                               PATIENT TYPE   LOCATION:                                       FACILITY:  [facility]   PHYSICIAN:  Dalia Heading, M.D.               DATE OF BIRTH:  January 03, 1926   DATE OF ADMISSION:  DATE OF DISCHARGE                                HISTORY & PHYSICAL   CHIEF COMPLAINT:  Need for screening colonoscopy.   HISTORY OF PRESENT ILLNESS:  The patient is a 77 year old black male who is  referred for screening colonoscopy.  He denies any abdominal complaints.  There is no family history of colon carcinoma.  Denies hemorrhoidal  problems.   PAST MEDICAL HISTORY:  Hypertension.   PAST SURGICAL HISTORY:  Herniorrhaphies in the past.   CURRENT MEDICATIONS:  Zantac, baby aspirin, hydrochlorothiazide.   ALLERGIES:  PENICILLIN.   REVIEW OF SYMPTOMS:  Unremarkable.   PHYSICAL EXAMINATION:  GENERAL:  The patient is a well-developed, well-  nourished black male in no acute distress.  VITAL SIGNS:  He is afebrile and vital signs are stable.  LUNGS:  Clear to auscultation with equal breath sounds bilaterally.  HEART:  Regular rate and rhythm without S3, S4, or murmurs.  ABDOMEN:  Soft, nontender, nondistended.  No hepatosplenomegaly or masses  are noted.  RECTAL:  Deferred until the procedure.   IMPRESSION:  Need for screening colonoscopy.   PLAN:  The patient is scheduled for a colonoscopy on October 21, 2001.  The  risks and benefits of the procedure including bleeding and perforation were  fully explained to the patient.  Gave informed consent.                                               Dalia Heading, M.D.    MAJ/MEDQ  D:  10/13/2001  T:  10/13/2001  Job:  [id number]   cc:   Kirk Ruths, M.D.

## 2010-08-04 ENCOUNTER — Telehealth: Payer: Self-pay | Admitting: *Deleted

## 2010-08-04 ENCOUNTER — Ambulatory Visit (HOSPITAL_COMMUNITY)
Admission: RE | Admit: 2010-08-04 | Discharge: 2010-08-04 | Disposition: A | Discharge status: Home / Self Care | Payer: Medicare Other | Source: Ambulatory Visit | Attending: Family Medicine | Admitting: Family Medicine

## 2010-08-04 NOTE — Telephone Encounter (Signed)
whats the cipro for?

## 2010-08-04 NOTE — Telephone Encounter (Signed)
Called patient and left message on machine to call here and let us know why he needs refill on Cipro ATBS

## 2010-08-04 NOTE — Telephone Encounter (Signed)
Patient requesting refill on Cipro 500mg  bid ok or not

## 2010-08-05 ENCOUNTER — Telehealth: Payer: Self-pay | Admitting: Orthopedic Surgery

## 2010-08-06 ENCOUNTER — Ambulatory Visit (HOSPITAL_COMMUNITY)
Admission: RE | Admit: 2010-08-06 | Discharge: 2010-08-06 | Disposition: A | Discharge status: Home / Self Care | Payer: Medicare Other | Source: Ambulatory Visit | Attending: Family Medicine | Admitting: Family Medicine

## 2010-08-07 NOTE — Telephone Encounter (Signed)
Done

## 2010-08-08 ENCOUNTER — Ambulatory Visit (HOSPITAL_COMMUNITY)
Admission: RE | Admit: 2010-08-08 | Discharge: 2010-08-08 | Disposition: A | Discharge status: Home / Self Care | Payer: Medicare Other | Source: Ambulatory Visit

## 2010-08-12 ENCOUNTER — Ambulatory Visit (HOSPITAL_COMMUNITY)
Admission: RE | Admit: 2010-08-12 | Discharge: 2010-08-12 | Disposition: A | Discharge status: Home / Self Care | Payer: Medicare Other | Source: Ambulatory Visit | Attending: Family Medicine | Admitting: Family Medicine

## 2010-08-13 ENCOUNTER — Ambulatory Visit (HOSPITAL_COMMUNITY)
Admission: RE | Admit: 2010-08-13 | Discharge: 2010-08-13 | Disposition: A | Discharge status: Home / Self Care | Payer: Medicare Other | Source: Ambulatory Visit | Attending: Family Medicine | Admitting: Family Medicine

## 2010-08-15 ENCOUNTER — Ambulatory Visit (HOSPITAL_COMMUNITY)
Admission: RE | Admit: 2010-08-15 | Discharge: 2010-08-15 | Disposition: A | Discharge status: Home / Self Care | Payer: Medicare Other | Source: Ambulatory Visit | Attending: Family Medicine | Admitting: Family Medicine

## 2010-08-15 DIAGNOSIS — M6281 Muscle weakness (generalized): Secondary | ICD-10-CM | POA: Insufficient documentation

## 2010-08-15 DIAGNOSIS — M25569 Pain in unspecified knee: Secondary | ICD-10-CM | POA: Insufficient documentation

## 2010-08-15 DIAGNOSIS — R262 Difficulty in walking, not elsewhere classified: Secondary | ICD-10-CM | POA: Insufficient documentation

## 2010-08-15 DIAGNOSIS — M25669 Stiffness of unspecified knee, not elsewhere classified: Secondary | ICD-10-CM | POA: Insufficient documentation

## 2010-08-15 DIAGNOSIS — IMO0001 Reserved for inherently not codable concepts without codable children: Secondary | ICD-10-CM | POA: Insufficient documentation

## 2010-08-19 ENCOUNTER — Ambulatory Visit (INDEPENDENT_AMBULATORY_CARE_PROVIDER_SITE_OTHER): Payer: Medicare Other | Admitting: Orthopedic Surgery

## 2010-08-19 DIAGNOSIS — IMO0002 Reserved for concepts with insufficient information to code with codable children: Secondary | ICD-10-CM

## 2010-08-19 DIAGNOSIS — M1710 Unilateral primary osteoarthritis, unspecified knee: Secondary | ICD-10-CM

## 2010-08-19 NOTE — Progress Notes (Signed)
Total knee replacement March 26  Severe flexion contracture.  Patient has been working on his extension.  Has jazz splint.  Flexion 110, extension -10.  Continue jazz splint. Okay to stop therapy, use cane as needed. Follow up one month

## 2010-09-16 ENCOUNTER — Ambulatory Visit (INDEPENDENT_AMBULATORY_CARE_PROVIDER_SITE_OTHER): Payer: Medicare Other | Admitting: Orthopedic Surgery

## 2010-09-16 ENCOUNTER — Encounter: Payer: Self-pay | Admitting: Orthopedic Surgery

## 2010-09-16 DIAGNOSIS — Z96659 Presence of unspecified artificial knee joint: Secondary | ICD-10-CM

## 2010-09-16 DIAGNOSIS — Z96651 Presence of right artificial knee joint: Secondary | ICD-10-CM | POA: Insufficient documentation

## 2010-09-16 NOTE — Patient Instructions (Signed)
Stop Jazz splint

## 2010-09-16 NOTE — Progress Notes (Signed)
2 months postop. Correction of severe valgus. Flexion contracture.  Patient used corrective splint to help with extension.  Doing very well walking unsupported.  Knee range of motion 5-115 degrees   Return in 3 months

## 2010-11-11 NOTE — Consult Note (Signed)
NAMECOLBURN, ASPER           ACCOUNT NO.:  [id number]  MEDICAL RECORD NO.:  [id number]           PATIENT TYPE:  I  LOCATION:  A314                          FACILITY:  [facility]  PHYSICIAN:  Gerrit Friends. Dietrich Pates, MD, FACCDATE OF BIRTH:  05-01-1925  DATE OF CONSULTATION:  06/12/2010 DATE OF DISCHARGE:                                CONSULTATION   CARDIOLOGIST:  Gerrit Friends. Dietrich Pates, MD, [facility]  PRIMARY CARE PHYSICIAN:  Scott A. Gerda Diss, MD  CHIEF COMPLAINT:  Dizziness.  HISTORY OF PRESENT ILLNESS:  This is an 77 year old African American male patient who underwent left total knee replacement on June 09, 2010.  He received cardiac clearance preop by Dr. Dietrich Pates.  He has a history of heart murmur, had stress test and echo at [facility] years ago and was told that was normal.  He has had no cardiac symptoms and no cardiac workup was done prior to surgery.  Yesterday, when he got up, he had some dizziness with standing but is feeling much better today after receiving a transfusion for hemoglobin of 8.7 and decreasing his medications.  He is up walking with PT and feels like a new man.  ALLERGIES:  PENICILLIN.  MEDICATIONS: 1. Celebrex 200 mg daily. 2. Lyrica 50 mg q.8 hours. 3. Aspirin 325 b.i.d. 4. Torsemide 20 mg daily. 5. Hydrochlorothiazide 25 mg daily. 6. K-Dur 10 mEq daily. 7. Pepcid 20 mg daily.  PAST MEDICAL HISTORY:  Significant for: 1. Hypertension. 2. Gastroesophageal reflux disease. 3. Osteoarthritis. 4. Diverticulitis. 5. Status post right total knee arthroplasty in July 2011. 6. Status post appendectomy.  SOCIAL HISTORY:  He is married, former smoker.  FAMILY HISTORY:  He has a family history of early CAD.  REVIEW OF SYSTEMS:  He did have some dizziness yesterday which has resolved.  He has no syncope, dyspepsia, dysphagia, nausea, vomiting, change in bowels or melena.  He denies chest pain, shortness of breath, dyspnea on exertion, PND, palpitations.  He does  have some edema of the left lower extremity.  PHYSICAL EXAMINATION:  GENERAL:  This is a pleasant 77 year old African American male in no acute distress. VITAL SIGNS:  Blood pressure 120/69, pulse 86, afebrile, respirations 16. HEENT:  Head is normocephalic without signs of trauma.  Extraocular movements are intact.  Pupils are equal, round and reactive to light and accommodation.  Mucosa is moist.  Throat is without erythema or exudate. NECK:  Without JVD, HJR, bruit, or thyroid enlargement. LUNGS:  Clear anterior and posterior and lateral. HEART:  Regular rate and rhythm at 80 beats per minute.  Normal S1-S2. Positive S4.  2/6 systolic ejection murmur at the left sternal border. ABDOMEN:  Soft, without organomegaly, mass, lesions or abnormal tenderness. EXTREMITIES:  He has 1+ edema in the left lower extremity from his knee replacement.  Positive distal pulses bilaterally. NEUROLOGICAL:  Without focal deficit.  LABORATORIES AND DIAGNOSTICS:  Chest x-ray COPD without acute findings. EKG normal sinus rhythm with nonspecific ST-T changes.  Hemoglobin today is 9.2, potassium 3.7, BUN 18, creatinine 1.55.  IMPRESSION: 1. Status post left total knee arthroplasty on June 09, 2010. 2. Dizziness postoperatively resolved with transfusion and  decreasing     pain medications. 3. Hypertension well controlled. 4. History of heart murmur stable. 5. History of gastroesophageal reflux disease.  PLAN:  The patient is stable today without further dizziness. We are available as needed.  Thank you for the consult.     Jacolyn Reedy, PA-C   ______________________________ Gerrit Friends. Dietrich Pates, MD, [facility]    ML/MEDQ  D:  06/12/2010  T:  06/13/2010  Job:  [id number]  Electronically Signed by Jacolyn Reedy PA-C on 10/29/2010 09:14:36 AM Electronically Signed by Diggins Bing MD [facility] on 11/11/2010 08:44:37 AM

## 2010-12-17 ENCOUNTER — Encounter: Payer: Self-pay | Admitting: Orthopedic Surgery

## 2010-12-17 ENCOUNTER — Ambulatory Visit (INDEPENDENT_AMBULATORY_CARE_PROVIDER_SITE_OTHER): Payer: Medicare Other | Admitting: Orthopedic Surgery

## 2010-12-17 DIAGNOSIS — Z96659 Presence of unspecified artificial knee joint: Secondary | ICD-10-CM

## 2010-12-17 DIAGNOSIS — IMO0002 Reserved for concepts with insufficient information to code with codable children: Secondary | ICD-10-CM

## 2010-12-17 DIAGNOSIS — M1710 Unilateral primary osteoarthritis, unspecified knee: Secondary | ICD-10-CM

## 2010-12-17 NOTE — Progress Notes (Signed)
   2 knee replacements, one on the RIGHT, March of this year, and one on the LEFT is July of 2011.  Complains of pain in the LEFT knee relieved by Vicodin.  LEFT knee. Range of motion is 5-120.  Continues to do well. No problems seen at this time. Follow up in March for x-rays of both knees

## 2010-12-17 NOTE — Patient Instructions (Signed)
March xrays both knees TKA's

## 2011-02-02 ENCOUNTER — Other Ambulatory Visit: Payer: Self-pay | Admitting: Orthopedic Surgery

## 2011-02-02 NOTE — Telephone Encounter (Signed)
Call in norco 5 mg q 6 hrs   # 60 2 refills

## 2011-02-09 ENCOUNTER — Encounter: Payer: Self-pay | Admitting: Cardiology

## 2011-05-21 ENCOUNTER — Ambulatory Visit (INDEPENDENT_AMBULATORY_CARE_PROVIDER_SITE_OTHER): Payer: Medicare Other | Admitting: Orthopedic Surgery

## 2011-05-21 ENCOUNTER — Encounter: Payer: Self-pay | Admitting: Orthopedic Surgery

## 2011-05-21 VITALS — BP 120/64 | Ht 71.0 in | Wt 170.0 lb

## 2011-05-21 DIAGNOSIS — Z96659 Presence of unspecified artificial knee joint: Secondary | ICD-10-CM

## 2011-05-21 NOTE — Patient Instructions (Signed)
Normal activity    

## 2011-05-21 NOTE — Progress Notes (Signed)
Patient ID: [name], male   DOB: September 02, 1925, 78 y.o.   MRN: [id number] Chief Complaint  Patient presents with  . Follow-up    Yearly recheck bilateral knee replacements   Annual total knee followup  RIGHT knee September 21, 2009  LEFT knee June 09, 2010.  The patient is doing well he does have a lump in the proximal to mid quadriceps area which has been there for some time since his surgery not painful he would like that evaluated  Review of systems occasional discomfort in the LEFT thigh  Exam shows he walks without support he has an excellent gait.  On inspection he has no swelling or tenderness he does have a 3 x 2 cm soft tissue mass in the midportion of his LEFT quadriceps proximal to the end of the incision which feels fluid filled.  Has excellent range of motion both of his knees his strength is normal his knees are stable  His x-rays show excellent alignment with no loosening  Impression cyst LEFT quad observation  Stable total knee implants bilaterally

## 2011-05-21 NOTE — Progress Notes (Signed)
Separately identifiable. X-ray report.  Total knee replacement annual x-ray.  Right knee and left knee   All 3 components are properly aligned. Overall knee alignment is normal. No signs of loosening.  Impression no complicating findings and his postoperative knee replacement film

## 2011-07-18 ENCOUNTER — Other Ambulatory Visit: Payer: Self-pay | Admitting: Orthopedic Surgery

## 2011-07-20 NOTE — Telephone Encounter (Signed)
APPROVE WITH 5 REFILLS

## 2012-05-02 ENCOUNTER — Other Ambulatory Visit: Payer: Self-pay | Admitting: *Deleted

## 2012-05-02 MED ORDER — HYDROCODONE-ACETAMINOPHEN 5-325 MG PO TABS: 1.0000 | ORAL_TABLET | Freq: Four times a day (QID) | ORAL | Status: DC | PRN

## 2012-05-03 ENCOUNTER — Other Ambulatory Visit: Payer: Self-pay | Admitting: *Deleted

## 2012-05-03 MED ORDER — HYDROCODONE-ACETAMINOPHEN 5-325 MG PO TABS: 1.0000 | ORAL_TABLET | Freq: Four times a day (QID) | ORAL | Status: DC | PRN

## 2012-06-14 ENCOUNTER — Ambulatory Visit (INDEPENDENT_AMBULATORY_CARE_PROVIDER_SITE_OTHER): Payer: Medicare Other

## 2012-06-14 ENCOUNTER — Encounter: Payer: Self-pay | Admitting: Orthopedic Surgery

## 2012-06-14 ENCOUNTER — Ambulatory Visit (INDEPENDENT_AMBULATORY_CARE_PROVIDER_SITE_OTHER): Payer: Medicare Other | Admitting: Orthopedic Surgery

## 2012-06-14 VITALS — BP 122/70 | Ht 71.0 in | Wt 170.0 lb

## 2012-06-14 DIAGNOSIS — Z96659 Presence of unspecified artificial knee joint: Secondary | ICD-10-CM

## 2012-06-14 DIAGNOSIS — Z96652 Presence of left artificial knee joint: Secondary | ICD-10-CM

## 2012-06-14 DIAGNOSIS — Z96651 Presence of right artificial knee joint: Secondary | ICD-10-CM

## 2012-06-14 MED ORDER — HYDROCODONE-ACETAMINOPHEN 5-325 MG PO TABS: 1.0000 | ORAL_TABLET | Freq: Four times a day (QID) | ORAL | Status: DC | PRN

## 2012-06-14 NOTE — Patient Instructions (Signed)
Take medication as needed  

## 2012-06-14 NOTE — Progress Notes (Signed)
Patient ID: [name], male   DOB: [birth year], 79 y.o.   MRN: [id number] Chief Complaint  Patient presents with  . Follow-up    Bilateral knee replacements right DOS 09/21/2009 left DOS 06/09/2010   BP 122/70  Ht 5\' 11"  (1.803 m)  Wt 170 lb (77.111 kg)  BMI 23.72 kg/m2  S/P total knee arthroplasty, right - Plan: DG Knee AP/LAT W/Sunrise Right, HYDROcodone-acetaminophen (NORCO/VICODIN) 5-325 MG per tablet  S/P total knee arthroplasty, left - Plan: DG Knee AP/LAT W/Sunrise Left, HYDROcodone-acetaminophen (NORCO/VICODIN) 5-325 MG per tablet  S/P total knee replacement, unspecified laterality   Routine followup yearly x-rays of his bilateral total knees he says his left knee still hurts him at times but is relieved by Norco 5 mg he does feel some pain in the front of the knee with activity.  Review of systems negative  Vital signs are stable as recorded. BP 122/70  Ht 5\' 11"  (1.803 m)  Wt 170 lb (77.111 kg)  BMI 23.72 kg/m2    General appearance is normal  The patient is alert and oriented x3  The patient's mood and affect are normal  Gait assessment: He is ambulating without a limp and no assistive devices  The cardiovascular exam reveals normal pulses and temperature without edema or  swelling.  The lymphatic system is negative for palpable lymph nodes in the groin  The sensory exam is normal. In both legs  There are no pathologic reflexes. In either leg  Balance is normal.   Exam of the right knee Inspection normal alignment no tenderness or swelling no joint effusion Range of motion estimated 120 of knee flexion Stability ligamentous exam is normal Strength straight leg raise 5 over 5 manual muscle testing strength Skin clean skin incision no neuromas  Examination the left knee shows a slight flexion contracture with an estimated flexion of 120 no joint effusion ligaments are stable strength is normal skin is clean dry and intact  X-rays show stable  prosthesis with good alignment.  Recommend x-ray repeat in a year  Continue Norco for pain as needed

## 2012-06-20 ENCOUNTER — Encounter: Payer: Self-pay | Admitting: *Deleted

## 2012-06-24 ENCOUNTER — Encounter: Payer: Self-pay | Admitting: Family Medicine

## 2012-06-24 ENCOUNTER — Ambulatory Visit (INDEPENDENT_AMBULATORY_CARE_PROVIDER_SITE_OTHER): Payer: Medicare Other | Admitting: Family Medicine

## 2012-06-24 VITALS — BP 128/82 | Ht 68.0 in | Wt 170.0 lb

## 2012-06-24 DIAGNOSIS — Z125 Encounter for screening for malignant neoplasm of prostate: Secondary | ICD-10-CM

## 2012-06-24 DIAGNOSIS — Z23 Encounter for immunization: Secondary | ICD-10-CM

## 2012-06-24 NOTE — Progress Notes (Signed)
Subjective:    [name] is a 79 y.o. male who presents for Medicare Annual/Subsequent preventive examination.   Preventive Screening-Counseling & Management  Tobacco History  Smoking status  . Never Smoker   Smokeless tobacco  . Not on file    Problems Prior to Visit 1.   Current Problems (verified) Patient Active Problem List  Diagnosis  . KNEE, ARTHRITIS, DEGEN./OSTEO  . GASTROESOPHAGEAL REFLUX DISEASE  . DIVERTICULOSIS, COLON  . TOBACCO ABUSE  . S/P total knee replacement    Medications Prior to Visit Current Outpatient Prescriptions on File Prior to Visit  Medication Sig Dispense Refill  . allopurinol (ZYLOPRIM) 100 MG tablet Take 100 mg by mouth daily.        Marland Kitchen aspirin 325 MG tablet Take 325 mg by mouth daily.        . baclofen (LIORESAL) 10 MG tablet Take 5 mg by mouth 3 (three) times daily.        . colchicine 0.6 MG tablet Take 0.6 mg by mouth daily.        . hydrochlorothiazide 12.5 MG capsule Take 12.5 mg by mouth.        Marland Kitchen HYDROcodone-acetaminophen (NORCO/VICODIN) 5-325 MG per tablet Take 1 tablet by mouth every 6 (six) hours as needed for pain.  60 tablet  5  . latanoprost (XALATAN) 0.005 % ophthalmic solution       . potassium chloride (K-DUR) 10 MEQ tablet Take 10 mEq by mouth 2 (two) times daily.        . ranitidine (ZANTAC) 150 MG capsule Take 150 mg by mouth.        . torsemide (DEMADEX) 20 MG tablet Take 20 mg by mouth daily.        . chlorproMAZINE (THORAZINE) 25 MG tablet Take 25 mg by mouth 4 (four) times daily as needed. 1 by mouth qid for hiccups        No current facility-administered medications on file prior to visit.    Current Medications (verified) Current Outpatient Prescriptions  Medication Sig Dispense Refill  . allopurinol (ZYLOPRIM) 100 MG tablet Take 100 mg by mouth daily.        Marland Kitchen aspirin 325 MG tablet Take 325 mg by mouth daily.        . baclofen (LIORESAL) 10 MG tablet Take 5 mg by mouth 3 (three) times daily.         . colchicine 0.6 MG tablet Take 0.6 mg by mouth daily.        . hydrochlorothiazide 12.5 MG capsule Take 12.5 mg by mouth.        Marland Kitchen HYDROcodone-acetaminophen (NORCO/VICODIN) 5-325 MG per tablet Take 1 tablet by mouth every 6 (six) hours as needed for pain.  60 tablet  5  . latanoprost (XALATAN) 0.005 % ophthalmic solution       . potassium chloride (K-DUR) 10 MEQ tablet Take 10 mEq by mouth 2 (two) times daily.        . ranitidine (ZANTAC) 150 MG capsule Take 150 mg by mouth.        . torsemide (DEMADEX) 20 MG tablet Take 20 mg by mouth daily.        . chlorproMAZINE (THORAZINE) 25 MG tablet Take 25 mg by mouth 4 (four) times daily as needed. 1 by mouth qid for hiccups        No current facility-administered medications for this visit.     Allergies (verified) Penicillins   PAST HISTORY  Family History  Family History  Problem Relation Age of Onset  . Arthritis Other   . Heart disease Other     Male < 55  . Hypertension Mother   . Hypertension Father   . Heart attack Father   . Cancer Brother     colon    Social History History  Substance Use Topics  . Smoking status: Never Smoker   . Smokeless tobacco: Not on file  . Alcohol Use: Not on file    Are there smokers in your home (other than you)?  No  Risk Factors Current exercise habits: keeps active with outside and inside activities  Dietary issues discussed: healthy eating discussed   Cardiac risk factors: advanced age (older than 65 for men, 75 for women) and male gender.  Depression Screen (Note: if answer to either of the following is "Yes", a more complete depression screening is indicated)   Q1: Over the past two weeks, have you felt down, depressed or hopeless? No  Q2: Over the past two weeks, have you felt little interest or pleasure in doing things? No  Have you lost interest or pleasure in daily life? No  Do you often feel hopeless? No  Do you cry easily over simple problems? No  Activities of Daily  Living In your present state of health, do you have any difficulty performing the following activities?:  Driving? Yes Managing money?  Yes Feeding yourself? Yes Getting from bed to chair? Yes Climbing a flight of stairs? Yes Preparing food and eating?: Yes Bathing or showering? Yes Getting dressed: Yes Getting to the toilet? Yes Using the toilet:Yes Moving around from place to place: Yes In the past year have you fallen or had a near fall?:No   Are you sexually active?  Yes  Do you have more than one partner?  Yes  Hearing Difficulties: Yes Do you often ask people to speak up or repeat themselves? No Do you experience ringing or noises in your ears? No Do you have difficulty understanding soft or whispered voices? No   Do you feel that you have a problem with memory? Yes  Do you often misplace items? No  Do you feel safe at home?  Yes  Cognitive Testing  Alert? Yes  Normal Appearance?Yes  Oriented to person? Yes  Place? Yes   Time? Yes  Recall of three objects?  No  Can perform simple calculations? Yes  Displays appropriate judgment?Yes  Can read the correct time from a watch face?Yes   Advanced Directives have been discussed with the patient? Yes   List the Names of Other Physician/Practitioners you currently use: 1.    Indicate any recent Medical Services you may have received from other than [facility] providers in the past year (date may be approximate).   There is no immunization history on file for this patient.  Screening Tests Health Maintenance  Topic Date Due  . Tetanus/tdap  12/04/1946  . Colonoscopy  12/03/1977  . Zostavax  12/04/1987  . Pneumococcal Polysaccharide Vaccine Age 41 And Over  12/03/1992  . Influenza Vaccine  11/14/2012    All answers were reviewed with the patient and necessary referrals were made:  Troy Vandervelden, MD   06/24/2012   History reviewed: allergies, current medications, past family history, past medical history, past social  history, past surgical history and problem list  Review of Systems A comprehensive review of systems was negative.    Objective:     Vision by Snellen chart: right eye:20/20, left eye:20/20 Blood  pressure 128/82, height 5\' 8"  (1.727 m), weight 170 lb (77.111 kg). Body mass index is 25.85 kg/(m^2).  BP 128/82  Ht 5\' 8"  (1.727 m)  Wt 170 lb (77.111 kg)  BMI 25.85 kg/m2  General Appearance:    Alert, cooperative, no distress, appears stated age  Head:    Normocephalic, without obvious abnormality, atraumatic  Eyes:    PERRL, conjunctiva/corneas clear, EOM's intact, fundi    benign, both eyes       Ears:    Normal TM's and external ear canals, both ears  Nose:   Nares normal, septum midline, mucosa normal, no drainage    or sinus tenderness  Throat:   Lips, mucosa, and tongue normal; teeth and gums normal  Neck:   Supple, symmetrical, trachea midline, no adenopathy;       thyroid:  No enlargement/tenderness/nodules; no carotid   bruit or JVD  Back:     Symmetric, no curvature, ROM normal, no CVA tenderness  Lungs:     Clear to auscultation bilaterally, respirations unlabored  Chest wall:    No tenderness or deformity  Heart:    Regular rate and rhythm, S1 and S2 normal, no murmur, rub   or gallop  Abdomen:     Soft, slight-tender on left side, bowel sounds active all four quadrants,    no masses, no organomegaly,   Genitalia:    Normal male without lesion, discharge or tenderness  Rectal:    Normal tone, normal prostate, no masses or tenderness;   guaiac negative stool  Extremities:   Extremities normal, atraumatic, no cyanosis or edema  Pulses:   2+ and symmetric all extremities  Skin:   Skin color, texture, turgor normal, no rashes or lesions  Lymph nodes:   Cervical, supraclavicular, and axillary nodes normal  Neurologic:   CNII-XII intact. Normal strength, sensation and reflexes      throughout       Assessment:     Overall good health      Plan:     During the  course of the visit the patient was educated and counseled about appropriate screening and preventive services including:    Pneumococcal vaccine   Diet review for nutrition referral? Yes ____  Not Indicated ____   Patient Instructions (the written plan) was given to the patient.  Medicare Attestation I have personally reviewed: The patient's medical and social history Their use of alcohol, tobacco or illicit drugs Their current medications and supplements The patient's functional ability including ADLs,fall risks, home safety risks, cognitive, and hearing and visual impairment Diet and physical activities Evidence for depression or mood disorders  The patient's weight, height, BMI, and visual acuity have been recorded in the chart.  I have made referrals, counseling, and provided education to the patient based on review of the above and I have provided the patient with a written personalized care plan for preventive services.     Daemion Mcniel, MD   06/24/2012    He did have some slight tenderness over the past 2-3 days in the left lower quadrant no guarding rebound or pain. I told him to watch this if he gets worse over the next day or 2 to call and we would start him on antibiotics but currently that is not necessary I was able to press firmly in all quadrants without significant discomfort.

## 2012-06-24 NOTE — Patient Instructions (Signed)
  Thank you for enrolling in MyChart. Please follow the instructions below to securely access your online medical record. MyChart allows you to send messages to your doctor, view your test results, manage appointments, and more.   How Do I Sign Up? 1. In your Internet browser, go to [employer] and enter [email or web address]. 2. Click on the Sign Up Now link in the Sign In box. You will see the New Member Sign Up page. 3. Enter your MyChart Access Code exactly as it appears below. You will not need to use this code after you've completed the sign-up process. If you do not sign up before the expiration date, you must request a new code. MyChart Access Code: [email or web address] Expires: 07/14/2012 11:08 AM  4. Enter your Social Security Number ([id number]) and Date of Birth (mm/dd/yyyy) as indicated and click Submit. You will be taken to the next sign-up page. 5. Create a MyChart ID. This will be your MyChart login ID and cannot be changed, so think of one that is secure and easy to remember. 6. Create a MyChart password. You can change your password at any time. 7. Enter your Password Reset Question and Answer. This can be used at a later time if you forget your password.  8. Enter your e-mail address. You will receive e-mail notification when new information is available in MyChart. 9. Click Sign Up. You can now view your medical record.   Additional Information Remember, MyChart is NOT to be used for urgent needs. For medical emergencies, dial 911.

## 2012-06-24 NOTE — Progress Notes (Signed)
Pt can only recall 1 of 3 words.  Denies any falls in the last 6 months.  Score of 20 on MMSE

## 2012-07-29 ENCOUNTER — Other Ambulatory Visit: Payer: Self-pay | Admitting: Family Medicine

## 2012-08-30 ENCOUNTER — Other Ambulatory Visit: Payer: Self-pay | Admitting: Family Medicine

## 2012-09-28 ENCOUNTER — Other Ambulatory Visit: Payer: Self-pay | Admitting: Family Medicine

## 2012-10-03 ENCOUNTER — Encounter: Payer: Self-pay | Admitting: Family Medicine

## 2012-10-03 ENCOUNTER — Ambulatory Visit (INDEPENDENT_AMBULATORY_CARE_PROVIDER_SITE_OTHER): Payer: Medicare Other | Admitting: Family Medicine

## 2012-10-03 VITALS — BP 120/78 | Wt 174.6 lb

## 2012-10-03 DIAGNOSIS — R109 Unspecified abdominal pain: Secondary | ICD-10-CM

## 2012-10-03 MED ORDER — CIPROFLOXACIN HCL 500 MG PO TABS: 500.0000 mg | ORAL_TABLET | Freq: Two times a day (BID) | ORAL | Status: AC

## 2012-10-03 MED ORDER — METRONIDAZOLE 500 MG PO TABS: 500.0000 mg | ORAL_TABLET | Freq: Three times a day (TID) | ORAL | Status: DC

## 2012-10-03 NOTE — Progress Notes (Signed)
  Subjective:    Patient ID: [name], male    DOB: 07-May-1925, 79 y.o.   MRN: [id number]  HPI Patient arrives with complaint of pain on left side off and on for years. Patient relates some left lower quadrant soreness over the past week and a half he relates to some degree this been present for years but it just seemed to got worse in the past few days no nausea vomiting or diarrhea associated with it. Denies high fever chills sweats. Denies hematuria or rectal bleeding. Past medical history diverticulosis reflux arthritis has not been abusing any medications recently. Patient does not smoke.  Review of Systems see above.     Objective:   Physical Exam Lungs are clear heart is regular abdomen is soft with subjective discomfort in the left lower quadrant extremities no edema skin warm dry neurologic grossly normal       Assessment & Plan:  Left lower quadrant pain-possible diverticulitis his abdominal exam is not acute I don't recommend a CAT scan at this point he's been having this problem for years but worse over the past one to 2 weeks Cipro and Flagyl for 7 days as ordered plus also we will go ahead and set him up with gastroenterology his last colonoscopy 2005 with Dr. Lovell Sheehan I think he would benefit from having a repeat colonoscopy.

## 2012-10-05 LAB — CBC WITH DIFFERENTIAL/PLATELET
Eosinophils Absolute: 0.3 10*3/uL (ref 0.0–0.7)
Lymphocytes Relative: 16 % (ref 12–46)
Lymphs Abs: 0.9 10*3/uL (ref 0.7–4.0)
MCH: 32.3 pg (ref 26.0–34.0)
Neutro Abs: 4 10*3/uL (ref 1.7–7.7)
Neutrophils Relative %: 70 % (ref 43–77)
Platelets: 160 10*3/uL (ref 150–400)
RBC: 4 MIL/uL — ABNORMAL LOW (ref 4.22–5.81)
WBC: 5.8 10*3/uL (ref 4.0–10.5)

## 2012-10-05 LAB — BASIC METABOLIC PANEL
CO2: 27 mEq/L (ref 19–32)
Chloride: 102 mEq/L (ref 96–112)
Creat: 1.22 mg/dL (ref 0.50–1.35)

## 2012-10-05 LAB — HEPATIC FUNCTION PANEL
Albumin: 4.2 g/dL (ref 3.5–5.2)
Alkaline Phosphatase: 77 U/L (ref 39–117)
Bilirubin, Direct: 0.4 mg/dL — ABNORMAL HIGH (ref 0.0–0.3)
Total Bilirubin: 2.2 mg/dL — ABNORMAL HIGH (ref 0.3–1.2)

## 2012-10-17 ENCOUNTER — Telehealth: Payer: Self-pay | Admitting: Family Medicine

## 2012-10-17 NOTE — Telephone Encounter (Signed)
NTC/clarify- check with [facility], I rec ref to GI. Pt will need ov  With them and colonoscopy. alsdo I told pt currently not needing CT. Possible he may need one BUT GI consult first.

## 2012-10-17 NOTE — Telephone Encounter (Signed)
Also, patient told daughter Ava, that he was referred to Dr Lodema Hong and he would like clarification on this

## 2012-10-17 NOTE — Telephone Encounter (Signed)
Discussed with daughter- Patient has appt. with Dr. Karilyn Cota August 27 at 11am.

## 2012-10-17 NOTE — Telephone Encounter (Signed)
Daughter (Ava) would like to know if her Dad still needs to have X-Rays?  States he was informed last visit that he would need an X-Ray.  Please call

## 2012-10-28 ENCOUNTER — Encounter (INDEPENDENT_AMBULATORY_CARE_PROVIDER_SITE_OTHER): Payer: Self-pay | Admitting: *Deleted

## 2012-11-09 ENCOUNTER — Telehealth (INDEPENDENT_AMBULATORY_CARE_PROVIDER_SITE_OTHER): Payer: Self-pay | Admitting: *Deleted

## 2012-11-09 ENCOUNTER — Encounter (INDEPENDENT_AMBULATORY_CARE_PROVIDER_SITE_OTHER): Payer: Self-pay | Admitting: Internal Medicine

## 2012-11-09 ENCOUNTER — Ambulatory Visit (INDEPENDENT_AMBULATORY_CARE_PROVIDER_SITE_OTHER): Payer: Medicare Other | Admitting: Internal Medicine

## 2012-11-09 ENCOUNTER — Other Ambulatory Visit (INDEPENDENT_AMBULATORY_CARE_PROVIDER_SITE_OTHER): Payer: Self-pay | Admitting: *Deleted

## 2012-11-09 VITALS — BP 126/60 | HR 56 | Temp 97.9°F | Ht 66.0 in | Wt 171.3 lb

## 2012-11-09 DIAGNOSIS — Z1211 Encounter for screening for malignant neoplasm of colon: Secondary | ICD-10-CM

## 2012-11-09 DIAGNOSIS — K5732 Diverticulitis of large intestine without perforation or abscess without bleeding: Secondary | ICD-10-CM

## 2012-11-09 DIAGNOSIS — R17 Unspecified jaundice: Secondary | ICD-10-CM

## 2012-11-09 DIAGNOSIS — K5792 Diverticulitis of intestine, part unspecified, without perforation or abscess without bleeding: Secondary | ICD-10-CM

## 2012-11-09 NOTE — Telephone Encounter (Signed)
Patient needs trilyte instructions 

## 2012-11-09 NOTE — Patient Instructions (Addendum)
Colonoscopy with Dr. Rehman 

## 2012-11-09 NOTE — Progress Notes (Addendum)
Subjective:     Patient ID: [name], male   DOB: 08/11/1925, 79 y.o.   MRN: [id number]  HPIReferred to our office by Dr. Lorin Picket for left lower abdominal pain.  He tells me he had left lower abdominal pain off and on for 15 yrs. There was no nausea or vomiting, or fever with his pain.  He saw Dr. Gerda Diss 2 weeks ago and was treated with Flagyl and Cipro x 7 days. Treated empirically for diverticulitis. Hx of sigmoid diverticulosis on CT in 2009.  There is no pain today.  Appetite is good. No weight loss. He usually has a BM once a day. No melena or bright red rectal bleeding. Hx of colon cancer in a brother ago in his 30s.   Family:Married. Retired from [employer]. 5 children in good health. Colonoscopy yrs ago by Dr. Lovell Sheehan in 2003: Multiple medium diverticula in the ascending colon, the descending colon and the sigmoid. Small non-bleeding hemorrhoids. Otherwise normal colonoscopy.    CBC    Component Value Date/Time   WBC 5.8 10/05/2012 1020   RBC 4.00* 10/05/2012 1020   HGB 12.9* 10/05/2012 1020   HCT 38.1* 10/05/2012 1020   PLT 160 10/05/2012 1020   MCV 95.3 10/05/2012 1020   MCH 32.3 10/05/2012 1020   MCHC 33.9 10/05/2012 1020   RDW 13.5 10/05/2012 1020   LYMPHSABS 0.9 10/05/2012 1020   MONOABS 0.5 10/05/2012 1020   EOSABS 0.3 10/05/2012 1020   BASOSABS 0.1 10/05/2012 1020   \ CMP     Component Value Date/Time   NA 139 10/05/2012 1020   K 4.1 10/05/2012 1020   CL 102 10/05/2012 1020   CO2 27 10/05/2012 1020   GLUCOSE 94 10/05/2012 1020   BUN 12 10/05/2012 1020   CREATININE 1.22 10/05/2012 1020   CREATININE 1.55* 06/12/2010 0542   CALCIUM 9.4 10/05/2012 1020   PROT 6.3 10/05/2012 1020   ALBUMIN 4.2 10/05/2012 1020   AST 17 10/05/2012 1020   ALT 9 10/05/2012 1020   ALKPHOS 77 10/05/2012 1020   BILITOT 2.2* 10/05/2012 1020   GFRNONAA 43* 06/12/2010 0542   GFRAA  Value: 52        The eGFR has been calculated using the MDRD equation. This calculation has not been validated in all  clinical situations. eGFR's persistently <60 mL/min signify possible Chronic Kidney Disease.* 06/12/2010 0542       Review of Systems see hpi Past Medical History  Diagnosis Date  . HTN (hypertension)   . Reflux   . Heart murmur     Evaluation by a cardiologist years ago was reportedly negative  . GERD (gastroesophageal reflux disease)   . Osteoarthritis   . Diverticulosis   . Gout    Current Outpatient Prescriptions on File Prior to Visit  Medication Sig Dispense Refill  . allopurinol (ZYLOPRIM) 300 MG tablet TAKE ONE TABLET DAILY FOR GOUT.  30 tablet  5  . aspirin 325 MG tablet Take 81 mg by mouth daily.       . chlorproMAZINE (THORAZINE) 25 MG tablet Take 25 mg by mouth 4 (four) times daily as needed. 1 by mouth qid for hiccups       . colchicine 0.6 MG tablet Take 0.6 mg by mouth daily.        . hydrochlorothiazide 12.5 MG capsule Take 12.5 mg by mouth.        Marland Kitchen HYDROcodone-acetaminophen (NORCO/VICODIN) 5-325 MG per tablet Take 1 tablet by mouth every 6 (six)  hours as needed for pain.  60 tablet  5  . latanoprost (XALATAN) 0.005 % ophthalmic solution       . potassium chloride (K-DUR) 10 MEQ tablet Take 10 mEq by mouth 2 (two) times daily.        . potassium chloride (K-DUR,KLOR-CON) 10 MEQ tablet TAKE ONE TABLET BY MOUTH ONCE DAILY.  30 tablet  2  . ranitidine (ZANTAC) 150 MG capsule Take 150 mg by mouth.        . torsemide (DEMADEX) 20 MG tablet TAKE 1/2 TABLET BY MOUTH EVERY MORNING.  15 tablet  5   No current facility-administered medications on file prior to visit.   Past Surgical History  Procedure Laterality Date  . Joint replacement      right total knee RIGHT total knee arthroplasty  . Total knee arthroplasty  09/2009    Right  . Appendectomy  2007  . Colonoscopy  2003  . Inguinal hernia repair      Right   Allergies  Allergen Reactions  . Penicillins        Objective:   Physical Exam  Filed Vitals:   11/09/12 1132  BP: 126/60  Pulse: 56  Temp:  97.9 F (36.6 C)  Height: 5\' 6"  (1.676 m)  Weight: 171 lb 4.8 oz (77.701 kg)    Alert and oriented. Skin warm and dry. Oral mucosa is moist.   . Sclera anicteric, conjunctivae is pink. Thyroid not enlarged. No cervical lymphadenopathy. Lungs clear. Heart regular rate and rhythm.  Abdomen is soft. Bowel sounds are positive. No hepatomegaly. No abdominal masses felt. No tenderness.  1+ edema to lower extremities.       Assessment:    Left lower quadrant pain which has now resolved. He was empirically treated by Dr. Franky Macho in July for diverticulitis.    Colonic neoplasm, polyp, AVM needs to be ruled out.  Elevated bilirubin 2.2. ? Etiology.   Plan:    Will find last colonoscopy report. No symptoms today. Will schedule a screening colonoscopy.   Will repeat Cmet today. If bilirubin up, he will need an US abdomen.

## 2012-11-10 ENCOUNTER — Encounter (HOSPITAL_COMMUNITY): Payer: Self-pay | Admitting: Pharmacy Technician

## 2012-11-10 LAB — COMPREHENSIVE METABOLIC PANEL
ALT: 8 U/L (ref 0–53)
Alkaline Phosphatase: 85 U/L (ref 39–117)
CO2: 29 mEq/L (ref 19–32)
Sodium: 141 mEq/L (ref 135–145)
Total Bilirubin: 1.3 mg/dL — ABNORMAL HIGH (ref 0.3–1.2)
Total Protein: 6.8 g/dL (ref 6.0–8.3)

## 2012-11-10 MED ORDER — PEG 3350-KCL-NA BICARB-NACL 420 G PO SOLR: 4000.0000 mL | Freq: Once | ORAL | Status: DC

## 2012-11-22 ENCOUNTER — Encounter (INDEPENDENT_AMBULATORY_CARE_PROVIDER_SITE_OTHER): Payer: Self-pay | Admitting: *Deleted

## 2012-12-01 ENCOUNTER — Other Ambulatory Visit: Payer: Self-pay | Admitting: Family Medicine

## 2012-12-28 ENCOUNTER — Ambulatory Visit (HOSPITAL_COMMUNITY)
Admission: RE | Admit: 2012-12-28 | Discharge: 2012-12-28 | Disposition: A | Discharge status: Home / Self Care | Payer: Medicare Other | Source: Ambulatory Visit | Attending: Internal Medicine | Admitting: Internal Medicine

## 2012-12-28 ENCOUNTER — Encounter (HOSPITAL_COMMUNITY): Payer: Self-pay | Admitting: *Deleted

## 2012-12-28 ENCOUNTER — Encounter (HOSPITAL_COMMUNITY)
Admission: RE | Disposition: A | Discharge status: Home / Self Care | Payer: Self-pay | Source: Ambulatory Visit | Attending: Internal Medicine

## 2012-12-28 DIAGNOSIS — R1032 Left lower quadrant pain: Secondary | ICD-10-CM | POA: Insufficient documentation

## 2012-12-28 DIAGNOSIS — K5730 Diverticulosis of large intestine without perforation or abscess without bleeding: Secondary | ICD-10-CM | POA: Insufficient documentation

## 2012-12-28 DIAGNOSIS — K644 Residual hemorrhoidal skin tags: Secondary | ICD-10-CM

## 2012-12-28 DIAGNOSIS — D126 Benign neoplasm of colon, unspecified: Secondary | ICD-10-CM

## 2012-12-28 DIAGNOSIS — K5792 Diverticulitis of intestine, part unspecified, without perforation or abscess without bleeding: Secondary | ICD-10-CM

## 2012-12-28 DIAGNOSIS — I10 Essential (primary) hypertension: Secondary | ICD-10-CM | POA: Insufficient documentation

## 2012-12-28 HISTORY — PX: COLONOSCOPY: SHX5424

## 2012-12-28 SURGERY — COLONOSCOPY
Anesthesia: Moderate Sedation

## 2012-12-28 MED ORDER — MIDAZOLAM HCL 5 MG/5ML IJ SOLN
INTRAMUSCULAR | Status: DC | PRN
  Administered 2012-12-28: 1 mg via INTRAVENOUS
  Administered 2012-12-28: 2 mg via INTRAVENOUS

## 2012-12-28 MED ORDER — DOCUSATE SODIUM 100 MG PO CAPS: 200.0000 mg | ORAL_CAPSULE | Freq: Every day | ORAL | Status: DC

## 2012-12-28 MED ORDER — STERILE WATER FOR IRRIGATION IR SOLN
Status: DC | PRN
  Administered 2012-12-28: 10:00:00

## 2012-12-28 MED ORDER — MEPERIDINE HCL 50 MG/ML IJ SOLN
INTRAMUSCULAR | Status: AC
  Filled 2012-12-28: qty 1

## 2012-12-28 MED ORDER — SODIUM CHLORIDE 0.9 % IV SOLN
INTRAVENOUS | Status: DC
  Administered 2012-12-28: 10:00:00 via INTRAVENOUS

## 2012-12-28 MED ORDER — MEPERIDINE HCL 50 MG/ML IJ SOLN
INTRAMUSCULAR | Status: DC | PRN
  Administered 2012-12-28: 25 mg via INTRAVENOUS

## 2012-12-28 MED ORDER — MIDAZOLAM HCL 5 MG/5ML IJ SOLN
INTRAMUSCULAR | Status: AC
  Filled 2012-12-28: qty 10

## 2012-12-28 NOTE — Op Note (Addendum)
COLONOSCOPY PROCEDURE REPORT  PATIENT:  [name]  MR#:  [id number] Birthdate:  12/26/1925, 79 y.o., male Endoscopist:  Dr. Malissa Hippo, MD Referred By:  Dr. Lilyan Punt, MD Procedure Date: 12/28/2012  Procedure:   Colonoscopy  Indications:  Patient is an 79 year old African American male with history of intermittent left lower quadrant abdominal pain who was recently treated for diverticulitis with resolution of his pain. He is undergoing diagnostic colonoscopy. Family history is negative for CRC.  Informed Consent:  The procedure and risks were reviewed with the patient and informed consent was obtained.  Medications:  Demerol 25 IV Versed 3 mg IV  Description of procedure:  After a digital rectal exam was performed, that colonoscope was advanced from the anus through the rectum and colon to the area of the cecum, ileocecal valve and appendiceal orifice. The cecum was deeply intubated. These structures were well-seen and photographed for the record. From the level of the cecum and ileocecal valve, the scope was slowly and cautiously withdrawn. The mucosal surfaces were carefully surveyed utilizing scope tip to flexion to facilitate fold flattening as needed. The scope was pulled down into the rectum where a thorough exam including retroflexion was performed.  Findings:   Prep excellent. Multiple diverticula and sigmoid colon with a few more scattered in rest of the colon. 4 mm polyp ablated via cold biopsy from sigmoid colon. Normal rectal mucosa. Small hemorrhoids below the dentate line.   Therapeutic/Diagnostic Maneuvers Performed:  See above.  Complications:  None  Cecal Withdrawal Time: 11 minutes  Impression:  Examination performed to cecum. Pan colonic diverticulosis. Most of the diverticula are located at sigmoid colon. Small polyps ablated via cold biopsy from sigmoid colon. External hemorrhoids.  Recommendations:  Standard instructions given. High  fiber diet. Metamucil 4 g by mouth each bedtime. Colace 200 mg by mouth each bedtime. I will contact patient with biopsy results and further recommendations.  REHMAN,NAJEEB U  12/28/2012 10:58 AM  CC: Dr. Lilyan Punt, MD & Dr. Bonnetta Barry ref. provider found

## 2012-12-28 NOTE — H&P (Signed)
[name] is an 79 y.o. male.   Chief Complaint: Patient is here for colonoscopy. HPI: Patient is a 77-year-old African male presents with intermittent LLQ abdominal pain. He was recently treated for diverticulitis and he has resolved. He was therefore referred for diagnostic colonoscopy. He denies change in his bowel habits melena or rectal bleeding. He has good appetite. Last colonoscopy was in 2003 revealing diverticulosis. Family history is negative for CRC.  Past Medical History  Diagnosis Date  . HTN (hypertension)   . Reflux   . Heart murmur     Evaluation by a cardiologist years ago was reportedly negative  . GERD (gastroesophageal reflux disease)   . Osteoarthritis   . Diverticulosis   . Gout     Past Surgical History  Procedure Laterality Date  . Joint replacement      right total knee RIGHT total knee arthroplasty  . Total knee arthroplasty  09/2009    Right  . Appendectomy  2007  . Colonoscopy  2003  . Inguinal hernia repair      Right  . Eye surgery Right     cataract removal    Family History  Problem Relation Age of Onset  . Arthritis Other   . Heart disease Other     Male < 55  . Hypertension Mother   . Hypertension Father   . Heart attack Father   . Cancer Brother     colon  . Uterine cancer Maternal Aunt    Social History:  reports that he has never smoked. He does not have any smokeless tobacco history on file. He reports that he does not drink alcohol or use illicit drugs.  Allergies:  Allergies  Allergen Reactions  . Penicillins     Medications Prior to Admission  Medication Sig Dispense Refill  . allopurinol (ZYLOPRIM) 300 MG tablet TAKE ONE TABLET DAILY FOR GOUT.  30 tablet  5  . aspirin EC 81 MG tablet Take 81 mg by mouth daily.      . colchicine 0.6 MG tablet Take 0.6 mg by mouth daily.        . hydrochlorothiazide 12.5 MG capsule Take 12.5 mg by mouth.        . latanoprost (XALATAN) 0.005 % ophthalmic solution       .  polyethylene glycol-electrolytes (NULYTELY/GOLYTELY) 420 G solution Take 4,000 mLs by mouth once.  4000 mL  0  . potassium chloride (K-DUR) 10 MEQ tablet Take 10 mEq by mouth 2 (two) times daily.        . potassium chloride (K-DUR,KLOR-CON) 10 MEQ tablet TAKE ONE TABLET BY MOUTH ONCE DAILY.  30 tablet  2  . ranitidine (ZANTAC) 150 MG capsule Take 150 mg by mouth.        . torsemide (DEMADEX) 20 MG tablet TAKE 1/2 TABLET BY MOUTH EVERY MORNING.  15 tablet  5  . chlorproMAZINE (THORAZINE) 25 MG tablet Take 25 mg by mouth 4 (four) times daily as needed. 1 by mouth qid for hiccups         No results found for this or any previous visit (from the past 48 hour(s)). No results found.  ROS  Blood pressure 145/85, pulse 71, temperature 97.6 F (36.4 C), temperature source Oral, resp. rate 17, height 5\' 9"  (1.753 m), weight 175 lb (79.379 kg), SpO2 98.00%. Physical Exam  Constitutional: He appears well-developed and well-nourished.  HENT:  Mouth/Throat: Oropharynx is clear and moist.  Eyes: No scleral icterus.  Neck:  No thyromegaly present.  Cardiovascular: Normal rate, regular rhythm and normal heart sounds.   No murmur heard. Respiratory: Effort normal and breath sounds normal.  GI: Soft. He exhibits no distension and no mass. There is no tenderness.  Musculoskeletal: He exhibits no edema.  Lymphadenopathy:    He has no cervical adenopathy.  Neurological: He is alert.  Skin: Skin is warm and dry.     Assessment/Plan LLQ abdominal pain. History of diverticulitis. Diagnostic colonoscopy.  Sang Blount U 12/28/2012, 10:20 AM

## 2013-01-03 ENCOUNTER — Encounter (HOSPITAL_COMMUNITY): Payer: Self-pay | Admitting: Internal Medicine

## 2013-01-20 ENCOUNTER — Encounter (INDEPENDENT_AMBULATORY_CARE_PROVIDER_SITE_OTHER): Payer: Self-pay

## 2013-02-03 ENCOUNTER — Other Ambulatory Visit: Payer: Self-pay | Admitting: Family Medicine

## 2013-03-15 ENCOUNTER — Telehealth: Payer: Self-pay | Admitting: Orthopedic Surgery

## 2013-03-15 NOTE — Telephone Encounter (Signed)
Routing to DR. Harrison  

## 2013-03-15 NOTE — Telephone Encounter (Signed)
[name] wants a prescription for Hydrocodone  His # [phone]

## 2013-03-20 ENCOUNTER — Other Ambulatory Visit: Payer: Self-pay | Admitting: Orthopedic Surgery

## 2013-03-20 ENCOUNTER — Ambulatory Visit (INDEPENDENT_AMBULATORY_CARE_PROVIDER_SITE_OTHER): Payer: Medicare HMO | Admitting: Family Medicine

## 2013-03-20 ENCOUNTER — Encounter: Payer: Self-pay | Admitting: Family Medicine

## 2013-03-20 VITALS — BP 122/70 | Temp 99.3°F | Ht 70.0 in | Wt 167.0 lb

## 2013-03-20 DIAGNOSIS — J209 Acute bronchitis, unspecified: Secondary | ICD-10-CM

## 2013-03-20 DIAGNOSIS — Z125 Encounter for screening for malignant neoplasm of prostate: Secondary | ICD-10-CM

## 2013-03-20 DIAGNOSIS — Z96659 Presence of unspecified artificial knee joint: Secondary | ICD-10-CM

## 2013-03-20 DIAGNOSIS — R634 Abnormal weight loss: Secondary | ICD-10-CM

## 2013-03-20 MED ORDER — HYDROCODONE-ACETAMINOPHEN 5-325 MG PO TABS: 1.0000 | ORAL_TABLET | Freq: Four times a day (QID) | ORAL | Status: DC | PRN

## 2013-03-20 MED ORDER — SULFAMETHOXAZOLE-TMP DS 800-160 MG PO TABS: 1.0000 | ORAL_TABLET | Freq: Two times a day (BID) | ORAL | Status: DC

## 2013-03-20 NOTE — Progress Notes (Signed)
   Subjective:    Patient ID: [name], male    DOB: [birth year], 80 y.o.   MRN: [id number]  Cough This is a new problem. The current episode started in the past 7 days. Associated symptoms include chest pain, a fever, headaches, myalgias and nasal congestion.   PMH benign he does state he has lost a few pounds since summertime doesn't know why he denies any rectal bleeding vomiting he states his appetite is doing good   Review of Systems  Constitutional: Positive for fever.  Respiratory: Positive for cough.   Cardiovascular: Positive for chest pain.  Musculoskeletal: Positive for myalgias.  Neurological: Positive for headaches.       Objective:   Physical Exam  Nursing note and vitals reviewed. Constitutional: He appears well-developed.  HENT:  Head: Normocephalic.  Mouth/Throat: Oropharynx is clear and moist. No oropharyngeal exudate.  Neck: Normal range of motion.  Cardiovascular: Normal rate, regular rhythm and normal heart sounds.   No murmur heard. Pulmonary/Chest: Effort normal and breath sounds normal. He has no wheezes.  Lymphadenopathy:    He has no cervical adenopathy.  Neurological: He exhibits normal muscle tone.  Skin: Skin is warm and dry.          Assessment & Plan:  #1 viral syndrome #2 acute sinusitis Bactrim twice a day 10 days #3 weight loss-check lab work followup within 2 weeks will discuss this further potentially further testing. Chest x-ray ordered. May need scans.

## 2013-03-21 NOTE — Telephone Encounter (Signed)
Prescription picked up by the patient

## 2013-03-24 ENCOUNTER — Ambulatory Visit (HOSPITAL_COMMUNITY)
Admission: RE | Admit: 2013-03-24 | Discharge: 2013-03-24 | Disposition: A | Discharge status: Home / Self Care | Payer: Medicare HMO | Source: Ambulatory Visit | Attending: Family Medicine | Admitting: Family Medicine

## 2013-03-24 DIAGNOSIS — R05 Cough: Secondary | ICD-10-CM | POA: Insufficient documentation

## 2013-03-24 DIAGNOSIS — R059 Cough, unspecified: Secondary | ICD-10-CM | POA: Insufficient documentation

## 2013-03-24 DIAGNOSIS — R0989 Other specified symptoms and signs involving the circulatory and respiratory systems: Secondary | ICD-10-CM | POA: Insufficient documentation

## 2013-03-24 LAB — CBC WITH DIFFERENTIAL/PLATELET
Basophils Absolute: 0 10*3/uL (ref 0.0–0.1)
Basophils Relative: 1 % (ref 0–1)
EOS PCT: 2 % (ref 0–5)
Eosinophils Absolute: 0.1 10*3/uL (ref 0.0–0.7)
HEMATOCRIT: 35.5 % — AB (ref 39.0–52.0)
HEMOGLOBIN: 11.8 g/dL — AB (ref 13.0–17.0)
LYMPHS ABS: 0.9 10*3/uL (ref 0.7–4.0)
LYMPHS PCT: 13 % (ref 12–46)
MCH: 32.6 pg (ref 26.0–34.0)
MCHC: 33.2 g/dL (ref 30.0–36.0)
MCV: 98.1 fL (ref 78.0–100.0)
MONOS PCT: 10 % (ref 3–12)
Monocytes Absolute: 0.7 10*3/uL (ref 0.1–1.0)
Neutro Abs: 5.2 10*3/uL (ref 1.7–7.7)
Neutrophils Relative %: 74 % (ref 43–77)
PLATELETS: 207 10*3/uL (ref 150–400)
RBC: 3.62 MIL/uL — AB (ref 4.22–5.81)
RDW: 12.9 % (ref 11.5–15.5)
WBC: 7 10*3/uL (ref 4.0–10.5)

## 2013-03-25 LAB — BASIC METABOLIC PANEL
BUN: 14 mg/dL (ref 6–23)
CHLORIDE: 104 meq/L (ref 96–112)
CO2: 27 meq/L (ref 19–32)
CREATININE: 1.35 mg/dL (ref 0.50–1.35)
Calcium: 9.3 mg/dL (ref 8.4–10.5)
GLUCOSE: 96 mg/dL (ref 70–99)
Potassium: 4.5 mEq/L (ref 3.5–5.3)
Sodium: 140 mEq/L (ref 135–145)

## 2013-03-25 LAB — HEPATIC FUNCTION PANEL
AST: 15 U/L (ref 0–37)
Albumin: 4.1 g/dL (ref 3.5–5.2)
Alkaline Phosphatase: 67 U/L (ref 39–117)
BILIRUBIN DIRECT: 0.4 mg/dL — AB (ref 0.0–0.3)
BILIRUBIN INDIRECT: 1.3 mg/dL — AB (ref 0.0–0.9)
BILIRUBIN TOTAL: 1.7 mg/dL — AB (ref 0.3–1.2)
Total Protein: 6.7 g/dL (ref 6.0–8.3)

## 2013-03-25 LAB — PSA, MEDICARE: PSA: 0.46 ng/mL (ref <=4.00)

## 2013-04-03 ENCOUNTER — Ambulatory Visit (INDEPENDENT_AMBULATORY_CARE_PROVIDER_SITE_OTHER): Payer: Medicare HMO | Admitting: Family Medicine

## 2013-04-03 ENCOUNTER — Encounter: Payer: Self-pay | Admitting: Family Medicine

## 2013-04-03 VITALS — BP 108/72 | Ht 70.0 in | Wt 168.1 lb

## 2013-04-03 DIAGNOSIS — R040 Epistaxis: Secondary | ICD-10-CM

## 2013-04-03 DIAGNOSIS — R634 Abnormal weight loss: Secondary | ICD-10-CM

## 2013-04-03 DIAGNOSIS — D509 Iron deficiency anemia, unspecified: Secondary | ICD-10-CM

## 2013-04-03 NOTE — Progress Notes (Signed)
   Subjective:    Patient ID: [name], male    DOB: [birth year], 80 y.o.   MRN: [id number]  HPI Patient is here today for a follow up visit on weight loss/weight check. States he is doing very well and is eating a lot to try to gain more weight. No other concerns noted at this time. Would like to discuss bloodwork results in the system.  I went over his blood work detail including mild anemia. He is aware of all this.  Review of Systems He denies sweats chills nausea vomiting diarrhea    Objective:   Physical Exam  Vitals reviewed. Constitutional: He appears well-nourished. No distress.  Cardiovascular: Normal rate, regular rhythm and normal heart sounds.   No murmur heard. Pulmonary/Chest: Effort normal and breath sounds normal. No respiratory distress.  Musculoskeletal: He exhibits no edema.  Lymphadenopathy:    He has no cervical adenopathy.  Neurological: He is alert.  Psychiatric: His behavior is normal.          Assessment & Plan:  Weight loss that she's she count we will follow this closely followup in 4 weeks #2 anemia-check her iron deficiency Hemoccult cards x3. He had a colonoscopy last year. Also check ferritin level. Await the results. #3 epistaxis-left nostril is somewhat reddened but otherwise no findings. He was talked to about how to treat the nosebleed. Also talked to regarding blood testing. Await the results

## 2013-04-06 ENCOUNTER — Other Ambulatory Visit: Payer: Self-pay | Admitting: *Deleted

## 2013-04-06 DIAGNOSIS — D509 Iron deficiency anemia, unspecified: Secondary | ICD-10-CM

## 2013-04-06 LAB — POC HEMOCCULT BLD/STL (HOME/3-CARD/SCREEN)
Card #3 Fecal Occult Blood, POC: NEGATIVE
FECAL OCCULT BLD: NEGATIVE
FECAL OCCULT BLD: NEGATIVE

## 2013-04-07 ENCOUNTER — Encounter: Payer: Self-pay | Admitting: Family Medicine

## 2013-04-07 LAB — T4, FREE: FREE T4: 1.15 ng/dL (ref 0.80–1.80)

## 2013-04-07 LAB — FERRITIN: Ferritin: 304 ng/mL (ref 22–322)

## 2013-04-07 LAB — TSH: TSH: 3.025 u[IU]/mL (ref 0.350–4.500)

## 2013-04-07 NOTE — Progress Notes (Signed)
Card sent 

## 2013-04-10 ENCOUNTER — Other Ambulatory Visit: Payer: Self-pay | Admitting: Family Medicine

## 2013-05-04 ENCOUNTER — Ambulatory Visit: Payer: Medicare HMO | Admitting: Family Medicine

## 2013-05-10 ENCOUNTER — Other Ambulatory Visit: Payer: Self-pay | Admitting: Family Medicine

## 2013-05-10 ENCOUNTER — Ambulatory Visit (INDEPENDENT_AMBULATORY_CARE_PROVIDER_SITE_OTHER): Payer: Medicare HMO | Admitting: Family Medicine

## 2013-05-10 ENCOUNTER — Encounter: Payer: Self-pay | Admitting: Family Medicine

## 2013-05-10 VITALS — BP 110/78 | Ht 70.0 in | Wt 171.8 lb

## 2013-05-10 DIAGNOSIS — R634 Abnormal weight loss: Secondary | ICD-10-CM

## 2013-05-10 NOTE — Progress Notes (Signed)
   Subjective:    Patient ID: [name], male    DOB: 1925-08-23, 80 y.o.   MRN: [id number]  HPI Patient arrives for a follow up on weight loss. Patient states he is eating better. Patient up to 171.8 lbs from 168 lbs at last visit. He denies any fevers sweats denies hematuria hematochezia vomiting diarrhea does not wake up in pain no fevers energy level overall doing fairly decent He does have some arthritis in his knees Review of Systems    see recent lab work. Objective:   Physical Exam His lungs are clear his heart is regular his abdomen is soft no masses are felt skin warm dry neurologic grossly normal       Assessment & Plan:  No weight loss currently he is actually gaining weight recently work looked reassuring no further testing necessary currently patient gets an occasional left lower quadrant ache that comes and goes I would recommend that we see him back again in approximately 6 weeks' time to recheck his weight and examination  They states he did have some postnasal drip had a slight bloody nose but he also coughed up a small amount of blood but he states he is not having any type of and not having any type of pulmonary symptoms in a matter of fact he had a chest x-ray recently that did not show any signs of any to we will follow this I will see him back in 4-6 weeks he was told that if he started having any more of these spells he is to notify us and we would do a CT scan

## 2013-05-10 NOTE — Patient Instructions (Signed)
Use humidifier to humidify the air Recheck here in 6 weeks

## 2013-06-10 ENCOUNTER — Other Ambulatory Visit: Payer: Self-pay | Admitting: Family Medicine

## 2013-06-21 ENCOUNTER — Ambulatory Visit: Payer: Medicare HMO | Admitting: Family Medicine

## 2013-06-23 ENCOUNTER — Encounter: Payer: Self-pay | Admitting: Family Medicine

## 2013-06-23 ENCOUNTER — Ambulatory Visit (INDEPENDENT_AMBULATORY_CARE_PROVIDER_SITE_OTHER): Payer: Medicare HMO | Admitting: Family Medicine

## 2013-06-23 VITALS — BP 112/72 | Ht 70.0 in | Wt 172.0 lb

## 2013-06-23 DIAGNOSIS — M1710 Unilateral primary osteoarthritis, unspecified knee: Secondary | ICD-10-CM

## 2013-06-23 DIAGNOSIS — IMO0002 Reserved for concepts with insufficient information to code with codable children: Secondary | ICD-10-CM

## 2013-06-23 DIAGNOSIS — R634 Abnormal weight loss: Secondary | ICD-10-CM

## 2013-06-23 NOTE — Progress Notes (Signed)
   Subjective:    Patient ID: [name], male    DOB: 06/14/1925, 80 y.o.   MRN: [id number]  HPI Patient is here today for a 6 week check up on his weight.  C/o arthritis  States appetite is doing better denies being depressed energy level doing better although still depressed. Does a lot of care taking for his wife. Denies hurting anywhere other than arthritis   Review of Systems    denies any night sweats fever chills nausea vomiting diarrhea. Objective:   Physical Exam Lungs are clear no crackle heart is regular neck no masses abdomen is soft extremities no edema       Assessment & Plan:  Weight loss-he states he gained a few pounds he is eating better than what he was. He states he is feeling a little bit better. Denies abdominal pain chest pains cough denies rectal bleeding. Patient had a colonoscopy last year in the fall time it looked good. His CBC did show some anemia I would recommend in 3 months time we recheck them again and at that time I would recommend looking at his CBC.  I told the patient that if he starts having fevers night sweats vomiting diarrhea poor appetite he needs followup he's been under some stress because his wife recently had a stroke he does more the caretaker  At times he does have problems with constipation I do recommend that he take MiraLax one capful daily this should help him.  Lab work in 3-4 months including CBC, uric acid, metabolic 7. Also lipid panel. Followup office visit after that.

## 2013-06-29 ENCOUNTER — Ambulatory Visit: Payer: Medicare HMO | Admitting: Orthopedic Surgery

## 2013-07-20 ENCOUNTER — Ambulatory Visit (INDEPENDENT_AMBULATORY_CARE_PROVIDER_SITE_OTHER): Payer: Medicare HMO

## 2013-07-20 ENCOUNTER — Other Ambulatory Visit: Payer: Self-pay | Admitting: Family Medicine

## 2013-07-20 ENCOUNTER — Ambulatory Visit (INDEPENDENT_AMBULATORY_CARE_PROVIDER_SITE_OTHER): Payer: Medicare HMO | Admitting: Orthopedic Surgery

## 2013-07-20 VITALS — BP 123/63 | Ht 70.0 in | Wt 172.0 lb

## 2013-07-20 DIAGNOSIS — M1710 Unilateral primary osteoarthritis, unspecified knee: Secondary | ICD-10-CM

## 2013-07-20 DIAGNOSIS — Z96652 Presence of left artificial knee joint: Secondary | ICD-10-CM

## 2013-07-20 DIAGNOSIS — Z96659 Presence of unspecified artificial knee joint: Secondary | ICD-10-CM

## 2013-07-20 DIAGNOSIS — Z96651 Presence of right artificial knee joint: Secondary | ICD-10-CM

## 2013-07-20 DIAGNOSIS — IMO0002 Reserved for concepts with insufficient information to code with codable children: Secondary | ICD-10-CM

## 2013-07-20 DIAGNOSIS — M179 Osteoarthritis of knee, unspecified: Secondary | ICD-10-CM

## 2013-07-20 NOTE — Progress Notes (Signed)
Patient ID: [name], male   DOB: 1925/08/25, 80 y.o.   MRN: [id number] Chief Complaint  Patient presents with  . Follow-up    Yearly Recheck Bilateral TKA  Right 09/21/2009 and Left 06/09/2010    HISTORY:    This is year #4 for the right knee year #3 for the left total knee  The patient is not having any pain in the right knee and mild pain in the left The patient's function with normal activities is excellent   Review of systems musculoskeletal the patient denies catching locking or giving way in the knee, no neurologic symptoms    General appearance is normal, the patient is alert and oriented x3 with normal mood and affect. Knee flexion is  125 on the right and 115 on the left both knees are stable and there is no tenderness or swelling in the neurovascular exams are intact  Status post bilateral total knees with normal knee films at year #3 and your #4

## 2013-07-20 NOTE — Patient Instructions (Signed)
activities as tolerated 

## 2013-08-10 ENCOUNTER — Other Ambulatory Visit: Payer: Self-pay | Admitting: Family Medicine

## 2013-08-17 ENCOUNTER — Ambulatory Visit (INDEPENDENT_AMBULATORY_CARE_PROVIDER_SITE_OTHER): Payer: Medicare HMO | Admitting: Family Medicine

## 2013-08-17 ENCOUNTER — Encounter: Payer: Self-pay | Admitting: Family Medicine

## 2013-08-17 VITALS — BP 122/80 | Ht 70.0 in | Wt 170.0 lb

## 2013-08-17 DIAGNOSIS — M545 Low back pain, unspecified: Secondary | ICD-10-CM

## 2013-08-17 NOTE — Progress Notes (Signed)
   Subjective:    Patient ID: [name], male    DOB: 05-14-1925, 80 y.o.   MRN: [id number]  Back Pain This is a new problem. Episode onset: 1 week ago. The problem occurs intermittently. The problem has been waxing and waning since onset. The pain is present in the sacro-iliac. The quality of the pain is described as aching. The pain does not radiate. The pain is worse during the day. The symptoms are aggravated by standing and twisting (walking). Risk factors: Was moving a clothes dryer.  He has tried heat and analgesics for the symptoms. The treatment provided mild relief.   Denies any other trouble.   Review of Systems  Musculoskeletal: Positive for back pain.   No cough no vomiting chest pain.    Objective:   Physical Exam  Lungs clear heart regular flanks nontender low back subjective discomfort no guarding or rebound no tenderness extremities and no edema straight leg raise normal patient is able to rotate flex and bend without difficulty      Assessment & Plan:  Low back pain related to lifting a dryer. I feel this patient will gradually get better warning signs discussed if he is not doing better within the next 2 weeks he is to notify us and we will do x-rays. Keep regular followup later this summer

## 2013-09-11 ENCOUNTER — Other Ambulatory Visit: Payer: Self-pay | Admitting: Family Medicine

## 2013-09-18 ENCOUNTER — Telehealth: Payer: Self-pay | Admitting: Family Medicine

## 2013-09-18 NOTE — Telephone Encounter (Signed)
Pt states he needs a letter from you for his [employer].  However, he needs this mailed out to them by the 7th of July  Please see chart for letter from [employer]

## 2013-09-18 NOTE — Telephone Encounter (Signed)
I'm ok with helping him but please call pt find out in his words why he needs to be exempted in regards to his symptoms

## 2013-09-19 NOTE — Telephone Encounter (Signed)
I do know when he was standing here he stated that he can't sit  For long periods of time due to his age an that he is the primary  Care take of his wife an can't leave her for that long either.

## 2013-09-19 NOTE — Telephone Encounter (Signed)
LMOM with Daughter

## 2013-09-19 NOTE — Telephone Encounter (Signed)
Letter done

## 2013-11-06 ENCOUNTER — Other Ambulatory Visit: Payer: Self-pay | Admitting: Family Medicine

## 2013-11-07 ENCOUNTER — Encounter: Payer: Medicare HMO | Admitting: Family Medicine

## 2014-01-05 ENCOUNTER — Other Ambulatory Visit: Payer: Self-pay | Admitting: Family Medicine

## 2014-01-11 ENCOUNTER — Encounter: Payer: Self-pay | Admitting: Family Medicine

## 2014-01-11 ENCOUNTER — Ambulatory Visit (INDEPENDENT_AMBULATORY_CARE_PROVIDER_SITE_OTHER): Payer: Medicare HMO | Admitting: Family Medicine

## 2014-01-11 VITALS — BP 122/80 | Ht 70.0 in | Wt 175.4 lb

## 2014-01-11 DIAGNOSIS — K219 Gastro-esophageal reflux disease without esophagitis: Secondary | ICD-10-CM

## 2014-01-11 DIAGNOSIS — M1000 Idiopathic gout, unspecified site: Secondary | ICD-10-CM

## 2014-01-11 DIAGNOSIS — M109 Gout, unspecified: Secondary | ICD-10-CM | POA: Insufficient documentation

## 2014-01-11 DIAGNOSIS — Z0000 Encounter for general adult medical examination without abnormal findings: Secondary | ICD-10-CM

## 2014-01-11 DIAGNOSIS — Z23 Encounter for immunization: Secondary | ICD-10-CM

## 2014-01-11 DIAGNOSIS — D509 Iron deficiency anemia, unspecified: Secondary | ICD-10-CM

## 2014-01-11 DIAGNOSIS — I10 Essential (primary) hypertension: Secondary | ICD-10-CM

## 2014-01-11 LAB — BASIC METABOLIC PANEL
BUN: 8 mg/dL (ref 6–23)
CO2: 30 meq/L (ref 19–32)
Calcium: 9.1 mg/dL (ref 8.4–10.5)
Chloride: 107 mEq/L (ref 96–112)
Creat: 1.18 mg/dL (ref 0.50–1.35)
GLUCOSE: 94 mg/dL (ref 70–99)
POTASSIUM: 4.6 meq/L (ref 3.5–5.3)
SODIUM: 143 meq/L (ref 135–145)

## 2014-01-11 LAB — URIC ACID: URIC ACID, SERUM: 3.4 mg/dL — AB (ref 4.0–7.8)

## 2014-01-11 LAB — CBC WITH DIFFERENTIAL/PLATELET
BASOS ABS: 0.1 10*3/uL (ref 0.0–0.1)
Basophils Relative: 1 % (ref 0–1)
EOS ABS: 0.4 10*3/uL (ref 0.0–0.7)
Eosinophils Relative: 8 % — ABNORMAL HIGH (ref 0–5)
HCT: 38.6 % — ABNORMAL LOW (ref 39.0–52.0)
Hemoglobin: 12.8 g/dL — ABNORMAL LOW (ref 13.0–17.0)
LYMPHS ABS: 1.2 10*3/uL (ref 0.7–4.0)
LYMPHS PCT: 21 % (ref 12–46)
MCH: 32.2 pg (ref 26.0–34.0)
MCHC: 33.2 g/dL (ref 30.0–36.0)
MCV: 97.2 fL (ref 78.0–100.0)
Monocytes Absolute: 0.6 10*3/uL (ref 0.1–1.0)
Monocytes Relative: 11 % (ref 3–12)
NEUTROS PCT: 59 % (ref 43–77)
Neutro Abs: 3.3 10*3/uL (ref 1.7–7.7)
PLATELETS: 163 10*3/uL (ref 150–400)
RBC: 3.97 MIL/uL — ABNORMAL LOW (ref 4.22–5.81)
RDW: 13.1 % (ref 11.5–15.5)
WBC: 5.6 10*3/uL (ref 4.0–10.5)

## 2014-01-11 LAB — LIPID PANEL
CHOL/HDL RATIO: 2.9 ratio
Cholesterol: 166 mg/dL (ref 0–200)
HDL: 57 mg/dL (ref >39)
LDL Cholesterol: 86 mg/dL (ref 0–99)
Triglycerides: 114 mg/dL (ref <150)
VLDL: 23 mg/dL (ref 0–40)

## 2014-01-11 NOTE — Progress Notes (Signed)
   Subjective:    Patient ID: [name], male    DOB: January 17, 1926, 80 y.o.   MRN: [id number]  HPI  AWV- Annual Wellness Visit  The patient was seen for their annual wellness visit. The patient's past medical history, surgical history, and family history were reviewed. Pertinent vaccines were reviewed ( tetanus, pneumonia, shingles, flu) The patient's medication list was reviewed and updated.  The height and weight were entered. The patient's current BMI is:25  Cognitive screening was completed. Outcome of Mini - Cog: pass  Falls within the past 6 months:none  Current tobacco usage: none (All patients who use tobacco were given written and verbal information on quitting)  Recent listing of emergency department/hospitalizations over the past year were reviewed.  current specialist the patient sees on a regular basis: none   Medicare annual wellness visit patient questionnaire was reviewed.  A written screening schedule for the patient for the next 5-10 years was given. Appropriate discussion of followup regarding next visit was discussed.  Patient states overall he feels he is doing pretty good he takes his medicine as directed tries the healthy He denies any flareup of gout recently he feels his blood pressures been under good control he denies any rectal bleeding he has a history of gout and anemia   Review of Systems  Constitutional: Negative for fever, activity change and appetite change.  HENT: Negative for congestion and rhinorrhea.   Eyes: Negative for discharge.  Respiratory: Negative for cough and wheezing.   Cardiovascular: Negative for chest pain.  Gastrointestinal: Negative for vomiting, abdominal pain and blood in stool.  Genitourinary: Negative for frequency and difficulty urinating.  Musculoskeletal: Negative for neck pain.  Skin: Negative for rash.  Allergic/Immunologic: Negative for environmental allergies and food allergies.  Neurological: Negative  for weakness and headaches.  Psychiatric/Behavioral: Negative for agitation.       Objective:   Physical Exam  Constitutional: He appears well-developed and well-nourished.  HENT:  Head: Normocephalic and atraumatic.  Right Ear: External ear normal.  Left Ear: External ear normal.  Nose: Nose normal.  Mouth/Throat: Oropharynx is clear and moist.  Eyes: EOM are normal. Pupils are equal, round, and reactive to light.  Neck: Normal range of motion. Neck supple. No thyromegaly present.  Cardiovascular: Normal rate, regular rhythm and normal heart sounds.   No murmur heard. Pulmonary/Chest: Effort normal and breath sounds normal. No respiratory distress. He has no wheezes.  Abdominal: Soft. Bowel sounds are normal. He exhibits no distension and no mass. There is no tenderness.  Genitourinary: Penis normal.  Musculoskeletal: Normal range of motion. He exhibits no edema.  Lymphadenopathy:    He has no cervical adenopathy.  Neurological: He is alert. He exhibits normal muscle tone.  Skin: Skin is warm and dry. No erythema.  Psychiatric: He has a normal mood and affect. His behavior is normal. Judgment normal.          Assessment & Plan:  #1 wellness-safety dietary measures discussed cognitive skills are good patient independent continue current measures, immunizations today  #2 HTN decent control #3 gout continue allopurinol check uric acid level #4 history hyperlipidemia check lipid profile

## 2014-02-05 ENCOUNTER — Other Ambulatory Visit: Payer: Self-pay | Admitting: Family Medicine

## 2014-02-16 ENCOUNTER — Telehealth: Payer: Self-pay | Admitting: Family Medicine

## 2014-02-16 MED ORDER — ALBUTEROL SULFATE HFA 108 (90 BASE) MCG/ACT IN AERS: 2.0000 | INHALATION_SPRAY | RESPIRATORY_TRACT | Status: DC | PRN

## 2014-02-16 NOTE — Telephone Encounter (Signed)
Albuterol not listed in paper chart

## 2014-02-16 NOTE — Telephone Encounter (Signed)
I assume no other inhaler? Talk with pt we can call in albuterol 2 puffs q4 hours prn wheeze. Let us know if problems , 3 refills

## 2014-02-16 NOTE — Telephone Encounter (Signed)
Pt states at his last visit he was told by the Doc he would send in  A refill/renewal on his inhalers. I don't see anything listed in his med  List on this attaching the chart for review  He hasn't had to use it in a while an the pharmacy told him they can't  Refill it till you send a new script since its been so long    [facility]

## 2014-02-16 NOTE — Telephone Encounter (Signed)
Nurses, pull paper chart see if this is albuterol? If so send in with 4 refills let me know if problems

## 2014-02-16 NOTE — Telephone Encounter (Signed)
Left message on voicemail notifying patient that we sent an inhaler to the pharmacy for him.

## 2014-03-06 ENCOUNTER — Telehealth: Payer: Self-pay | Admitting: Family Medicine

## 2014-03-06 NOTE — Telephone Encounter (Signed)
According to the notes sent by the pharmacist patient is experiencing significant shortness of breath and wheezing on a regular basis. Apparently albuterol is not doing enough. Please connect with the patient. Verify the information. If so, then I recommend the following #1-pulmonary function tests with pre-and post nebulizer for the reason of dyspnea/wheezing #2 follow-up office visit 2-3 weeks after the pulmonary function test in order to allow for the results to come back so that we can have further discussion

## 2014-03-07 ENCOUNTER — Other Ambulatory Visit: Payer: Self-pay | Admitting: Family Medicine

## 2014-03-07 NOTE — Telephone Encounter (Signed)
Discussed with pt. He states his breathing is better. Has not had any wheezing in the past 3 days and does not want to do test at this time. He states he will call us back if he decides he wants to do PFT.

## 2014-05-03 ENCOUNTER — Other Ambulatory Visit: Payer: Self-pay | Admitting: Family Medicine

## 2014-07-24 ENCOUNTER — Ambulatory Visit: Payer: Medicare HMO | Admitting: Orthopedic Surgery

## 2014-07-31 ENCOUNTER — Other Ambulatory Visit: Payer: Self-pay | Admitting: Family Medicine

## 2014-08-17 ENCOUNTER — Other Ambulatory Visit: Payer: Self-pay | Admitting: Family Medicine

## 2014-08-23 ENCOUNTER — Encounter: Payer: Self-pay | Admitting: Orthopedic Surgery

## 2014-08-23 ENCOUNTER — Ambulatory Visit (INDEPENDENT_AMBULATORY_CARE_PROVIDER_SITE_OTHER): Payer: Medicare HMO | Admitting: Orthopedic Surgery

## 2014-08-23 ENCOUNTER — Ambulatory Visit (INDEPENDENT_AMBULATORY_CARE_PROVIDER_SITE_OTHER): Payer: Medicare HMO

## 2014-08-23 ENCOUNTER — Ambulatory Visit: Payer: Medicare HMO

## 2014-08-23 VITALS — BP 150/79 | Ht 70.0 in | Wt 174.0 lb

## 2014-08-23 DIAGNOSIS — M129 Arthropathy, unspecified: Secondary | ICD-10-CM

## 2014-08-23 DIAGNOSIS — Z96651 Presence of right artificial knee joint: Secondary | ICD-10-CM

## 2014-08-23 DIAGNOSIS — M1710 Unilateral primary osteoarthritis, unspecified knee: Secondary | ICD-10-CM

## 2014-08-23 DIAGNOSIS — Z96652 Presence of left artificial knee joint: Secondary | ICD-10-CM | POA: Diagnosis not present

## 2014-08-23 DIAGNOSIS — M25561 Pain in right knee: Secondary | ICD-10-CM

## 2014-08-23 DIAGNOSIS — M25562 Pain in left knee: Secondary | ICD-10-CM | POA: Diagnosis not present

## 2014-08-23 MED ORDER — HYDROCODONE-ACETAMINOPHEN 5-325 MG PO TABS: 1.0000 | ORAL_TABLET | Freq: Three times a day (TID) | ORAL | Status: DC | PRN

## 2014-08-23 NOTE — Progress Notes (Signed)
Patient ID: [name], male   DOB: 01-07-1926, 81 y.o.   MRN: [id number] Patient ID: [name], male   DOB: 20-Aug-1925, 81 y.o.   MRN: [id number]  Post op annual TKA   Chief Complaint  Patient presents with  . Follow-up    Yearly recheck on bilateral knee replacements. DOS right 09-21-09, left 06-09-10.    HPI [name] is a 81 y.o. male.  Bilateral knee replacements  C/o of intermittent anterior knee pain   Very active    Past Medical History  Diagnosis Date  . HTN (hypertension)   . Reflux   . Heart murmur     Evaluation by a cardiologist years ago was reportedly negative  . GERD (gastroesophageal reflux disease)   . Osteoarthritis   . Diverticulosis   . Gout     Past Surgical History  Procedure Laterality Date  . Joint replacement      right total knee RIGHT total knee arthroplasty  . Total knee arthroplasty  09/2009    Right  . Appendectomy  2007  . Colonoscopy  2003  . Inguinal hernia repair      Right  . Eye surgery Right     cataract removal  . Colonoscopy N/A 12/28/2012    Procedure: COLONOSCOPY;  Surgeon: Rogene Houston, MD;  Location: [facility];  Service: Endoscopy;  Laterality: N/A;  255-rescheduled to 10:30 Ann notified pt     Allergies  Allergen Reactions  . Penicillins     Current Outpatient Prescriptions  Medication Sig Dispense Refill  . albuterol (PROVENTIL HFA;VENTOLIN HFA) 108 (90 BASE) MCG/ACT inhaler Inhale 2 puffs into the lungs every 4 (four) hours as needed for wheezing or shortness of breath. 1 Inhaler 3  . allopurinol (ZYLOPRIM) 300 MG tablet TAKE ONE TABLET DAILY FOR GOUT. 15 tablet 0  . aspirin EC 81 MG tablet Take 81 mg by mouth daily.    . chlorproMAZINE (THORAZINE) 25 MG tablet Take 25 mg by mouth 4 (four) times daily as needed. 1 by mouth qid for hiccups     . colchicine 0.6 MG tablet Take 0.6 mg by mouth daily.      Marland Kitchen docusate sodium (COLACE) 100 MG capsule Take 2 capsules (200 mg total) by mouth  at bedtime.  0  . hydrochlorothiazide 12.5 MG capsule Take 12.5 mg by mouth.      Marland Kitchen HYDROcodone-acetaminophen (NORCO/VICODIN) 5-325 MG per tablet Take 1 tablet by mouth every 8 (eight) hours as needed for moderate pain. 60 tablet 0  . latanoprost (XALATAN) 0.005 % ophthalmic solution     . mometasone (ELOCON) 0.1 % cream APPLY TO AFFECTED AREAS TWICE DAILY FOR 3 WEEKS. 45 g 6  . potassium chloride (K-DUR,KLOR-CON) 10 MEQ tablet TAKE ONE TABLET BY MOUTH ONCE DAILY. 15 tablet 0  . ranitidine (ZANTAC) 150 MG tablet TAKE (1) TABLET BY MOUTH TWICE DAILY. 60 tablet 2  . torsemide (DEMADEX) 20 MG tablet TAKE 1/2 TABLET BY MOUTH EVERY MORNING. 8 tablet 0  . triamcinolone cream (KENALOG) 0.1 % APPLY TO AFFECTED AREAS TWICE DAILY AS NEEDED. 120 g 2   No current facility-administered medications for this visit.    Review of Systems Review of Systems  Constitutional: Negative for fever.  Neurological: Negative for weakness and numbness.     Physical Exam Blood pressure 150/79, height 5\' 10"  (1.778 m), weight 174 lb (78.926 kg).   Gen. appearance is normal there are no congenital abnormalities   The patient  is oriented 3   Mood and affect are normal   Ambulation is without assistive device   Right knee Knee inspection reveals a well-healed incision with no swelling   Knee flexion 120  Left knee: Inspection reveals well-healed incision no swelling knee flexion 125  Both knees are/exhibit Stability in the anteroposterior plane is normal as well as in the medial lateral plane  Motor exam reveals full extension without extensor lag in both knees   Data Reviewed   x-ray was ordered of the right knee and x-ray was ordered of the left knee KNEE XRAYS : Both films are normal in terms of knee alignment and no evidence of loosening  Assessment S/P bilateral TKA   Plan    Return in a year x-rays again  Meds ordered this encounter  Medications  . HYDROcodone-acetaminophen  (NORCO/VICODIN) 5-325 MG per tablet    Sig: Take 1 tablet by mouth every 8 (eight) hours as needed for moderate pain.    Dispense:  60 tablet    Refill:  0          Arther Abbott 08/23/2014, 10:32 AM

## 2014-08-31 ENCOUNTER — Other Ambulatory Visit: Payer: Self-pay | Admitting: Family Medicine

## 2014-09-15 ENCOUNTER — Other Ambulatory Visit: Payer: Self-pay | Admitting: Family Medicine

## 2014-09-18 NOTE — Telephone Encounter (Signed)
Last seen 01/11/14

## 2014-09-19 ENCOUNTER — Telehealth: Payer: Self-pay | Admitting: Family Medicine

## 2014-09-19 MED ORDER — POTASSIUM CHLORIDE CRYS ER 10 MEQ PO TBCR: 10.0000 meq | EXTENDED_RELEASE_TABLET | Freq: Every day | ORAL | Status: DC

## 2014-09-19 MED ORDER — TORSEMIDE 20 MG PO TABS: ORAL_TABLET | ORAL | Status: DC

## 2014-09-19 MED ORDER — ALLOPURINOL 300 MG PO TABS: ORAL_TABLET | ORAL | Status: DC

## 2014-09-19 NOTE — Telephone Encounter (Signed)
Rxs sent electronically to pharmacy. Patient notified. 

## 2014-09-19 NOTE — Telephone Encounter (Signed)
May have 3 refills of each keep appointment in August

## 2014-09-19 NOTE — Telephone Encounter (Signed)
Pt is needing refills on his allopurinol, potassium chloride,and torsemide. Pt has an appt scheduled for the first week of Aug.   [facility]

## 2014-10-01 ENCOUNTER — Telehealth: Payer: Self-pay | Admitting: Family Medicine

## 2014-10-01 NOTE — Telephone Encounter (Signed)
Patient Daughter called today stating that the patient had c/o constipation "bowel locking up" with a knot coming up on his side. Spoke with Dr.Scott and Dr.Scott stated for the patient to try otc enema, and to take a cap full of miralax. Patient could try a max of two enemas. Dr.Scott stated if the symptoms persists or pain gets worst or vomiting begins. Patient is to go to the ER. Patient's daughter verbalized understanding and patient was sent to the front for an appointment to be scheduled for tomorrow 10/02/14.

## 2014-10-02 ENCOUNTER — Encounter: Payer: Self-pay | Admitting: Family Medicine

## 2014-10-02 ENCOUNTER — Ambulatory Visit (INDEPENDENT_AMBULATORY_CARE_PROVIDER_SITE_OTHER): Payer: Medicare HMO | Admitting: Family Medicine

## 2014-10-02 VITALS — BP 134/82 | Ht 70.0 in | Wt 177.0 lb

## 2014-10-02 DIAGNOSIS — D509 Iron deficiency anemia, unspecified: Secondary | ICD-10-CM | POA: Diagnosis not present

## 2014-10-02 DIAGNOSIS — K5900 Constipation, unspecified: Secondary | ICD-10-CM | POA: Diagnosis not present

## 2014-10-02 NOTE — Progress Notes (Signed)
   Subjective:    Patient ID: [name], male    DOB: [birth year], 81 y.o.   MRN: [id number]  Constipation This is a recurrent problem. Episode onset: 1 week. Associated symptoms include abdominal pain. Treatments tried: milk of magnessia and metamucil, colace.  pt states he had bm yesterday and today. Pt states looked normal. Not hard and no blood. abd pain is better since having bm.  Pt is taking hydrocodone that was prescribed by Dr. Aline Brochure for knee pain.  Had colonoscopy oct 2014. Patient denies any weight loss states appetite good no vomiting. He is worried about cancer  Review of Systems  Gastrointestinal: Positive for abdominal pain and constipation.       Objective:   Physical Exam  Lungs clear heart regular abdomen soft no guarding rebound or tenderness rectal exam normal helical negative      Assessment & Plan:  Constipation seemingly resolved continue stool softeners on a regular basis do lab work follow-up 3-4 weeks had colonoscopy a couple years ago which was normal may need further testing if tests abnormal  He agrees to follow-up

## 2014-10-03 ENCOUNTER — Encounter: Payer: Self-pay | Admitting: Family Medicine

## 2014-10-03 LAB — CBC WITH DIFFERENTIAL/PLATELET
BASOS ABS: 0.1 10*3/uL (ref 0.0–0.2)
BASOS: 2 %
EOS (ABSOLUTE): 0.2 10*3/uL (ref 0.0–0.4)
Eos: 4 %
Hematocrit: 39.7 % (ref 37.5–51.0)
Hemoglobin: 13.1 g/dL (ref 12.6–17.7)
Immature Grans (Abs): 0 10*3/uL (ref 0.0–0.1)
Immature Granulocytes: 0 %
LYMPHS ABS: 0.9 10*3/uL (ref 0.7–3.1)
Lymphs: 18 %
MCH: 32.3 pg (ref 26.6–33.0)
MCHC: 33 g/dL (ref 31.5–35.7)
MCV: 98 fL — ABNORMAL HIGH (ref 79–97)
Monocytes Absolute: 0.4 10*3/uL (ref 0.1–0.9)
Monocytes: 8 %
Neutrophils Absolute: 3.4 10*3/uL (ref 1.4–7.0)
Neutrophils: 68 %
PLATELETS: 150 10*3/uL (ref 150–379)
RBC: 4.05 x10E6/uL — ABNORMAL LOW (ref 4.14–5.80)
RDW: 13.2 % (ref 12.3–15.4)
WBC: 5 10*3/uL (ref 3.4–10.8)

## 2014-10-03 LAB — IRON AND TIBC
IRON SATURATION: 26 % (ref 15–55)
Iron: 73 ug/dL (ref 38–169)
TIBC: 276 ug/dL (ref 250–450)
UIBC: 203 ug/dL (ref 111–343)

## 2014-10-03 LAB — FERRITIN: Ferritin: 210 ng/mL (ref 30–400)

## 2014-10-08 ENCOUNTER — Encounter: Payer: Self-pay | Admitting: Family Medicine

## 2014-10-08 ENCOUNTER — Other Ambulatory Visit: Payer: Self-pay | Admitting: *Deleted

## 2014-10-08 DIAGNOSIS — D509 Iron deficiency anemia, unspecified: Secondary | ICD-10-CM

## 2014-10-08 DIAGNOSIS — K5900 Constipation, unspecified: Secondary | ICD-10-CM

## 2014-10-08 LAB — POC HEMOCCULT BLD/STL (HOME/3-CARD/SCREEN)
Card #2 Fecal Occult Blod, POC: NEGATIVE
FECAL OCCULT BLD: NEGATIVE
Fecal Occult Blood, POC: NEGATIVE

## 2014-10-16 ENCOUNTER — Ambulatory Visit (INDEPENDENT_AMBULATORY_CARE_PROVIDER_SITE_OTHER): Payer: Medicare HMO | Admitting: Family Medicine

## 2014-10-16 ENCOUNTER — Encounter: Payer: Self-pay | Admitting: Family Medicine

## 2014-10-16 VITALS — BP 134/82 | Ht 70.0 in | Wt 178.6 lb

## 2014-10-16 DIAGNOSIS — I10 Essential (primary) hypertension: Secondary | ICD-10-CM | POA: Diagnosis not present

## 2014-10-16 DIAGNOSIS — K5900 Constipation, unspecified: Secondary | ICD-10-CM

## 2014-10-16 MED ORDER — ALLOPURINOL 300 MG PO TABS
ORAL_TABLET | ORAL | Status: DC
Start: 2014-10-16 — End: 2016-10-02

## 2014-10-16 MED ORDER — POTASSIUM CHLORIDE CRYS ER 10 MEQ PO TBCR: 10.0000 meq | EXTENDED_RELEASE_TABLET | Freq: Every day | ORAL | Status: DC

## 2014-10-16 MED ORDER — RANITIDINE HCL 150 MG PO TABS: ORAL_TABLET | ORAL | Status: DC

## 2014-10-16 MED ORDER — TORSEMIDE 20 MG PO TABS: ORAL_TABLET | ORAL | Status: DC

## 2014-10-16 NOTE — Patient Instructions (Signed)
1/2 to 1 capful of Miralax in 1 glass of water daily  Goal is soft Bowel Movement every 2 days  Recheck here in 1 month

## 2014-10-16 NOTE — Progress Notes (Signed)
   Subjective:    Patient ID: [name], male    DOB: Feb 14, 1926, 81 y.o.   MRN: [id number]  Hypertension This is a chronic problem. The current episode started more than 1 year ago. Pertinent negatives include no chest pain. There are no compliance problems.    Patient states that he would like to discuss reoccurring symptoms of constipation. Patient states no other concerns at this time.  bm BALLS/moderate difficulty with passing bowel movements colonoscopy recently negative within the past couple years Hemoccult-negative hemoglobin ferritin normal  Review of Systems  Constitutional: Negative for activity change, appetite change and fatigue.  HENT: Negative for congestion.   Respiratory: Negative for cough.   Cardiovascular: Negative for chest pain.  Gastrointestinal: Negative for abdominal pain.  Endocrine: Negative for polydipsia and polyphagia.  Neurological: Negative for weakness.  Psychiatric/Behavioral: Negative for confusion.       Objective:   Physical Exam  Constitutional: He appears well-nourished. No distress.  Cardiovascular: Normal rate, regular rhythm and normal heart sounds.   No murmur heard. Pulmonary/Chest: Effort normal and breath sounds normal. No respiratory distress.  Musculoskeletal: He exhibits no edema.  Lymphadenopathy:    He has no cervical adenopathy.  Neurological: He is alert.  Psychiatric: His behavior is normal.  Vitals reviewed.         Assessment & Plan:  Patient to use MiraLAX on a daily basis if this does not significantly improve things possible referral to gastroenterology recheck 4-6 weeks  Wellness exam in the fall  1 pressure good control continue current measures

## 2014-11-27 ENCOUNTER — Ambulatory Visit: Payer: Medicare HMO | Admitting: Family Medicine

## 2014-12-27 ENCOUNTER — Ambulatory Visit (INDEPENDENT_AMBULATORY_CARE_PROVIDER_SITE_OTHER): Payer: Medicare HMO | Admitting: Family Medicine

## 2014-12-27 ENCOUNTER — Encounter: Payer: Self-pay | Admitting: Family Medicine

## 2014-12-27 VITALS — BP 140/94 | Temp 97.9°F | Ht 70.0 in | Wt 181.0 lb

## 2014-12-27 DIAGNOSIS — Z0000 Encounter for general adult medical examination without abnormal findings: Secondary | ICD-10-CM | POA: Diagnosis not present

## 2014-12-27 DIAGNOSIS — Z79899 Other long term (current) drug therapy: Secondary | ICD-10-CM

## 2014-12-27 DIAGNOSIS — I10 Essential (primary) hypertension: Secondary | ICD-10-CM | POA: Diagnosis not present

## 2014-12-27 DIAGNOSIS — F09 Unspecified mental disorder due to known physiological condition: Secondary | ICD-10-CM

## 2014-12-27 DIAGNOSIS — M1000 Idiopathic gout, unspecified site: Secondary | ICD-10-CM

## 2014-12-27 DIAGNOSIS — K219 Gastro-esophageal reflux disease without esophagitis: Secondary | ICD-10-CM

## 2014-12-27 DIAGNOSIS — R69 Illness, unspecified: Secondary | ICD-10-CM | POA: Diagnosis not present

## 2014-12-27 DIAGNOSIS — Z23 Encounter for immunization: Secondary | ICD-10-CM | POA: Diagnosis not present

## 2014-12-27 DIAGNOSIS — E785 Hyperlipidemia, unspecified: Secondary | ICD-10-CM

## 2014-12-27 MED ORDER — AZITHROMYCIN 250 MG PO TABS: ORAL_TABLET | ORAL | Status: DC

## 2014-12-27 NOTE — Progress Notes (Signed)
Subjective:    Patient ID: [name], male    DOB: [birth year], 81 y.o.   MRN: [id number]  HPI AWV- Annual Wellness Visit  The patient was seen for their annual wellness visit. The patient's past medical history, surgical history, and family history were reviewed. Pertinent vaccines were reviewed ( tetanus, pneumonia, shingles, flu) The patient's medication list was reviewed and updated.  The height and weight were entered. The patient's current BMI is:25.97  Cognitive screening was completed. Outcome of Mini - Cog: fail MMSE 22/30  Falls within the past 6 months: none  Current tobacco usage: none (All patients who use tobacco were given written and verbal information on quitting)  Recent listing of emergency department/hospitalizations over the past year were reviewed.  current specialist the patient sees on a regular basis: none   Medicare annual wellness visit patient questionnaire was reviewed.  A written screening schedule for the patient for the next 5-10 years was given. Appropriate discussion of followup regarding next visit was discussed.  Coughing and sore throat for the past 4 days. Taking alka seltzer.   patient denies wheezing shortness breath nausea vomiting   he states his reflexes been under good control with Zantac.  Pay states he has not had any flareups of his gout lately but does need lab testing in relates he does take his medicines.    Review of Systems  Constitutional: Negative for fever, activity change and appetite change.  HENT: Negative for congestion and rhinorrhea.   Eyes: Negative for discharge.  Respiratory: Negative for cough and wheezing.   Cardiovascular: Negative for chest pain.  Gastrointestinal: Negative for vomiting, abdominal pain and blood in stool.  Genitourinary: Negative for frequency and difficulty urinating.  Musculoskeletal: Negative for neck pain.  Skin: Negative for rash.  Allergic/Immunologic: Negative for  environmental allergies and food allergies.  Neurological: Negative for weakness and headaches.  Psychiatric/Behavioral: Negative for agitation.       Objective:   Physical Exam  Constitutional: He appears well-developed and well-nourished.  HENT:  Head: Normocephalic and atraumatic.  Right Ear: External ear normal.  Left Ear: External ear normal.  Nose: Nose normal.  Mouth/Throat: Oropharynx is clear and moist.  Eyes: EOM are normal. Pupils are equal, round, and reactive to light.  Neck: Normal range of motion. Neck supple. No thyromegaly present.  Cardiovascular: Normal rate, regular rhythm and normal heart sounds.   No murmur heard. Pulmonary/Chest: Effort normal and breath sounds normal. No respiratory distress. He has no wheezes.  Abdominal: Soft. Bowel sounds are normal. He exhibits no distension and no mass. There is no tenderness.  Genitourinary: Prostate normal and penis normal.  Musculoskeletal: Normal range of motion. He exhibits no edema.  Lymphadenopathy:    He has no cervical adenopathy.  Neurological: He is alert. He exhibits normal muscle tone.  Skin: Skin is warm and dry. No erythema.  Psychiatric: He has a normal mood and affect. His behavior is normal. Judgment normal.    no chest congestion but does have subjective head congestion with sinus pressure       Assessment & Plan:  Safety dietary measures all discussed immunizations review patient not due for any type colonoscopy because of age patient does have some cognitive dysfunction we did discuss this. Importance of follow-up again in 4-5 months. I do believe the patient can drive but I recommended that he drives only locally in [place].   we did discuss his cognitive dysfunction. I believe that he is  at risk of developing dementia. I would not recommend medication currently. He is still able to function at home and able to drive. I recommend follow-up again in 4-5 months.   reflux good control with  Zantac denies any chest pressure tightness with    gout-takes his medicine on a regular basis needs to have lab testing completed a look at uric acid level no flareups recently   hyperlipidemia not quite bad enough to be on medication but we will check cholesterol profile.   mild acute sinusitis antibiotics prescribed.

## 2014-12-28 ENCOUNTER — Encounter: Payer: Self-pay | Admitting: Family Medicine

## 2014-12-28 LAB — HEPATIC FUNCTION PANEL
ALK PHOS: 83 IU/L (ref 39–117)
ALT: 10 IU/L (ref 0–44)
AST: 20 IU/L (ref 0–40)
Albumin: 4.4 g/dL (ref 3.5–4.7)
Bilirubin Total: 1.7 mg/dL — ABNORMAL HIGH (ref 0.0–1.2)
Bilirubin, Direct: 0.31 mg/dL (ref 0.00–0.40)
Total Protein: 6.5 g/dL (ref 6.0–8.5)

## 2014-12-28 LAB — LIPID PANEL
CHOLESTEROL TOTAL: 164 mg/dL (ref 100–199)
Chol/HDL Ratio: 2.4 ratio units (ref 0.0–5.0)
HDL: 68 mg/dL (ref >39)
LDL CALC: 83 mg/dL (ref 0–99)
Triglycerides: 67 mg/dL (ref 0–149)
VLDL CHOLESTEROL CAL: 13 mg/dL (ref 5–40)

## 2014-12-28 LAB — URIC ACID: Uric Acid: 4.2 mg/dL (ref 3.7–8.6)

## 2015-04-15 ENCOUNTER — Other Ambulatory Visit: Payer: Self-pay | Admitting: Family Medicine

## 2015-06-03 ENCOUNTER — Telehealth: Payer: Self-pay | Admitting: Orthopedic Surgery

## 2015-06-03 NOTE — Telephone Encounter (Signed)
Recommend tylenol 500 mg q 6 as needed

## 2015-06-03 NOTE — Telephone Encounter (Signed)
Patient requesting medication refill on: HYDROcodone-acetaminophen (NORCO/VICODIN) 5-325 MG per tablet [id number] - patient is scheduled for annual appointment with Xray in June of 2017 - please advise. PH# [phone]

## 2015-06-03 NOTE — Telephone Encounter (Signed)
ROUTING TO DR HARRISON FOR APPROVAL 

## 2015-06-04 NOTE — Telephone Encounter (Signed)
CALLED PATIENT, NO ANSWER, LEFT VM

## 2015-06-06 NOTE — Telephone Encounter (Signed)
Patient aware.

## 2015-06-18 ENCOUNTER — Ambulatory Visit: Payer: Medicare HMO | Admitting: Family Medicine

## 2015-06-26 ENCOUNTER — Ambulatory Visit: Payer: Medicare HMO | Admitting: Family Medicine

## 2015-07-25 DIAGNOSIS — H2510 Age-related nuclear cataract, unspecified eye: Secondary | ICD-10-CM | POA: Diagnosis not present

## 2015-07-25 DIAGNOSIS — I10 Essential (primary) hypertension: Secondary | ICD-10-CM | POA: Diagnosis not present

## 2015-07-25 DIAGNOSIS — H5210 Myopia, unspecified eye: Secondary | ICD-10-CM | POA: Diagnosis not present

## 2015-08-21 ENCOUNTER — Other Ambulatory Visit: Payer: Self-pay | Admitting: Family Medicine

## 2015-08-27 ENCOUNTER — Ambulatory Visit: Payer: Medicare HMO

## 2015-08-27 ENCOUNTER — Ambulatory Visit (INDEPENDENT_AMBULATORY_CARE_PROVIDER_SITE_OTHER): Payer: Medicare HMO | Admitting: Orthopedic Surgery

## 2015-08-27 ENCOUNTER — Encounter: Payer: Self-pay | Admitting: Orthopedic Surgery

## 2015-08-27 DIAGNOSIS — M129 Arthropathy, unspecified: Secondary | ICD-10-CM

## 2015-08-27 NOTE — Progress Notes (Signed)
No show

## 2015-09-14 ENCOUNTER — Other Ambulatory Visit: Payer: Self-pay | Admitting: Family Medicine

## 2015-10-29 ENCOUNTER — Other Ambulatory Visit: Payer: Self-pay | Admitting: Family Medicine

## 2015-10-30 NOTE — Telephone Encounter (Signed)
1 refill, patient needs office visit 

## 2015-11-11 ENCOUNTER — Other Ambulatory Visit: Payer: Self-pay | Admitting: Family Medicine

## 2015-11-12 ENCOUNTER — Encounter: Payer: Self-pay | Admitting: Family Medicine

## 2015-11-12 ENCOUNTER — Ambulatory Visit (INDEPENDENT_AMBULATORY_CARE_PROVIDER_SITE_OTHER): Payer: Medicare HMO | Admitting: Family Medicine

## 2015-11-12 VITALS — BP 128/86 | Temp 97.8°F | Ht 70.0 in | Wt 181.1 lb

## 2015-11-12 DIAGNOSIS — M25561 Pain in right knee: Secondary | ICD-10-CM | POA: Diagnosis not present

## 2015-11-12 DIAGNOSIS — E785 Hyperlipidemia, unspecified: Secondary | ICD-10-CM | POA: Diagnosis not present

## 2015-11-12 DIAGNOSIS — M25562 Pain in left knee: Secondary | ICD-10-CM

## 2015-11-12 DIAGNOSIS — M542 Cervicalgia: Secondary | ICD-10-CM | POA: Diagnosis not present

## 2015-11-12 DIAGNOSIS — I10 Essential (primary) hypertension: Secondary | ICD-10-CM | POA: Diagnosis not present

## 2015-11-12 DIAGNOSIS — D649 Anemia, unspecified: Secondary | ICD-10-CM | POA: Diagnosis not present

## 2015-11-12 DIAGNOSIS — M1000 Idiopathic gout, unspecified site: Secondary | ICD-10-CM | POA: Diagnosis not present

## 2015-11-12 MED ORDER — HYDROCODONE-ACETAMINOPHEN 5-325 MG PO TABS: 1.0000 | ORAL_TABLET | Freq: Four times a day (QID) | ORAL | 0 refills | Status: DC | PRN

## 2015-11-12 MED ORDER — NAPROXEN 500 MG PO TABS: 500.0000 mg | ORAL_TABLET | Freq: Two times a day (BID) | ORAL | 0 refills | Status: DC

## 2015-11-12 NOTE — Progress Notes (Signed)
   Subjective:    Patient ID: [name], male    DOB: 02-27-1926, 82 y.o.   MRN: [id number]  Neck Pain   This is a new problem. The current episode started in the past 7 days. The pain is associated with lifting a heavy object. Associated symptoms comments: Shoulder pain. He has tried NSAIDs (Muscle rub) for the symptoms.  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 Denies numbness or tingling down his arm. Denies chest pressure  Patient states no other concerns this visit. Review of Systems  Musculoskeletal: Positive for neck pain.       Objective:   Physical Exam On physical exam muscle tenderness up her back more on the left side has difficult time rotating his neck to the left. No weakness in the arms lungs clear heart regular.       Assessment & Plan:  Strain muscle may use pain medication toward evening time caution drowsiness anti-inflammatory for shortness and a time and also to do warm compresses if not doing dramatically better over the next 10-14 days to follow-up  Regular checkup for his chronic health issues recommended in a few weeks time lab work ordered

## 2015-11-12 NOTE — Telephone Encounter (Signed)
May have 3 months on each medicine

## 2015-11-15 DIAGNOSIS — M1000 Idiopathic gout, unspecified site: Secondary | ICD-10-CM | POA: Diagnosis not present

## 2015-11-15 DIAGNOSIS — I10 Essential (primary) hypertension: Secondary | ICD-10-CM | POA: Diagnosis not present

## 2015-11-15 DIAGNOSIS — E785 Hyperlipidemia, unspecified: Secondary | ICD-10-CM | POA: Diagnosis not present

## 2015-11-15 DIAGNOSIS — D649 Anemia, unspecified: Secondary | ICD-10-CM | POA: Diagnosis not present

## 2015-11-16 LAB — HEPATIC FUNCTION PANEL
ALBUMIN: 4.3 g/dL (ref 3.5–4.7)
ALT: 8 IU/L (ref 0–44)
AST: 17 IU/L (ref 0–40)
Alkaline Phosphatase: 65 IU/L (ref 39–117)
BILIRUBIN TOTAL: 1.3 mg/dL — AB (ref 0.0–1.2)
BILIRUBIN, DIRECT: 0.32 mg/dL (ref 0.00–0.40)
TOTAL PROTEIN: 6.4 g/dL (ref 6.0–8.5)

## 2015-11-16 LAB — FERRITIN: Ferritin: 306 ng/mL (ref 30–400)

## 2015-11-16 LAB — IRON AND TIBC
IRON: 136 ug/dL (ref 38–169)
Iron Saturation: 56 % — ABNORMAL HIGH (ref 15–55)
Total Iron Binding Capacity: 243 ug/dL — ABNORMAL LOW (ref 250–450)
UIBC: 107 ug/dL — AB (ref 111–343)

## 2015-11-16 LAB — LIPID PANEL
CHOL/HDL RATIO: 2.8 ratio (ref 0.0–5.0)
Cholesterol, Total: 144 mg/dL (ref 100–199)
HDL: 51 mg/dL (ref >39)
LDL Calculated: 68 mg/dL (ref 0–99)
Triglycerides: 123 mg/dL (ref 0–149)
VLDL Cholesterol Cal: 25 mg/dL (ref 5–40)

## 2015-11-16 LAB — URIC ACID: URIC ACID: 8.3 mg/dL (ref 3.7–8.6)

## 2015-11-16 LAB — BASIC METABOLIC PANEL
BUN/Creatinine Ratio: 10 (ref 10–24)
BUN: 12 mg/dL (ref 8–27)
CALCIUM: 9.4 mg/dL (ref 8.6–10.2)
CHLORIDE: 103 mmol/L (ref 96–106)
CO2: 25 mmol/L (ref 18–29)
CREATININE: 1.19 mg/dL (ref 0.76–1.27)
GFR calc non Af Amer: 55 mL/min/{1.73_m2} — ABNORMAL LOW (ref >59)
GFR, EST AFRICAN AMERICAN: 63 mL/min/{1.73_m2} (ref >59)
Glucose: 94 mg/dL (ref 65–99)
Potassium: 4.5 mmol/L (ref 3.5–5.2)
Sodium: 145 mmol/L — ABNORMAL HIGH (ref 134–144)

## 2015-11-23 ENCOUNTER — Other Ambulatory Visit: Payer: Self-pay | Admitting: Family Medicine

## 2015-12-19 ENCOUNTER — Ambulatory Visit: Payer: Medicare HMO | Admitting: Family Medicine

## 2015-12-25 ENCOUNTER — Ambulatory Visit (INDEPENDENT_AMBULATORY_CARE_PROVIDER_SITE_OTHER): Payer: Medicare HMO | Admitting: Family Medicine

## 2015-12-25 ENCOUNTER — Encounter: Payer: Self-pay | Admitting: Family Medicine

## 2015-12-25 VITALS — BP 122/80 | Ht 70.0 in | Wt 181.0 lb

## 2015-12-25 DIAGNOSIS — Z79899 Other long term (current) drug therapy: Secondary | ICD-10-CM

## 2015-12-25 DIAGNOSIS — R0789 Other chest pain: Secondary | ICD-10-CM | POA: Diagnosis not present

## 2015-12-25 DIAGNOSIS — Z23 Encounter for immunization: Secondary | ICD-10-CM | POA: Diagnosis not present

## 2015-12-25 DIAGNOSIS — M1000 Idiopathic gout, unspecified site: Secondary | ICD-10-CM

## 2015-12-25 DIAGNOSIS — E784 Other hyperlipidemia: Secondary | ICD-10-CM | POA: Diagnosis not present

## 2015-12-25 DIAGNOSIS — E7849 Other hyperlipidemia: Secondary | ICD-10-CM

## 2015-12-25 DIAGNOSIS — I10 Essential (primary) hypertension: Secondary | ICD-10-CM | POA: Diagnosis not present

## 2015-12-25 NOTE — Progress Notes (Signed)
   Subjective:    Patient ID: [name], male    DOB: 03-Apr-1925, 82 y.o.   MRN: [id number]  HPI    Review of Systems     Objective:   Physical Exam        Assessment & Plan:

## 2015-12-25 NOTE — Progress Notes (Signed)
   Subjective:    Patient ID: [name], male    DOB: 1925-11-20, 82 y.o.   MRN: [id number]  Hypertension  This is a chronic problem. Associated symptoms include chest pain. Pertinent negatives include no shortness of breath. There are no compliance problems (eats healthy, stays active).    Chest pain on right side. Started a couple weeks ago. Off and on. Right arm pain started about the same time as the chest pain. He states it does not radiate down the arm. Does not cause shortness of breath. He describes it more as a sharp pain but sometimes it 8 on the right sided chest sometimes at rest sometimes with minimal activity. Denies coughing up any phlegm denies hemoptysis. Denies any angina symptoms when he walks around.  Flu vaccine today.  Patient states his gout overall doing well he is not taking medication currently. Patient does state he takes his potassium and fluid pill on a regular basis as directed He does take his Zantac for intermittent acid reflux  Pt states he does not need any refills today.  Pt scheduled physical for next month. Wants orders for bloodwork.  He rarely uses pain medication for his arthritis and he does take his anti-inflammatory.  Review of Systems  Constitutional: Negative for chills, diaphoresis, fatigue and fever.  HENT: Negative for congestion.   Respiratory: Negative for cough, shortness of breath and stridor.   Cardiovascular: Positive for chest pain.  Gastrointestinal: Negative for abdominal pain.  Musculoskeletal: Positive for arthralgias.       Objective:   Physical Exam  Constitutional: He appears well-nourished. No distress.  Cardiovascular: Normal rate, regular rhythm and normal heart sounds.   No murmur heard. Pulmonary/Chest: Effort normal and breath sounds normal. No respiratory distress.  Musculoskeletal: He exhibits no edema.  Lymphadenopathy:    He has no cervical adenopathy.  Neurological: He is alert.  Psychiatric: His  behavior is normal.  Vitals reviewed.         Assessment & Plan:  HTN good control continue diuretic continue to watch diet History hyperlipidemia check lipid profile await the results History of gout no longer taking allopurinol check uric acid level wait the results Osteoarthritis of his knees and hands may use Naprosyn when necessary Chest discomfort does not sound cardiac. Will do EKG-EKG is good-patient was encouraged to follow-up in several weeks' time after doing lab work for preventative health exam

## 2015-12-26 LAB — BASIC METABOLIC PANEL
BUN/Creatinine Ratio: 10 (ref 10–24)
BUN: 12 mg/dL (ref 8–27)
CHLORIDE: 101 mmol/L (ref 96–106)
CO2: 28 mmol/L (ref 18–29)
CREATININE: 1.25 mg/dL (ref 0.76–1.27)
Calcium: 9.5 mg/dL (ref 8.6–10.2)
GFR calc Af Amer: 59 mL/min/{1.73_m2} — ABNORMAL LOW (ref >59)
GFR calc non Af Amer: 51 mL/min/{1.73_m2} — ABNORMAL LOW (ref >59)
GLUCOSE: 96 mg/dL (ref 65–99)
Potassium: 4.7 mmol/L (ref 3.5–5.2)
Sodium: 145 mmol/L — ABNORMAL HIGH (ref 134–144)

## 2015-12-26 LAB — LIPID PANEL
CHOL/HDL RATIO: 2.8 ratio (ref 0.0–5.0)
Cholesterol, Total: 173 mg/dL (ref 100–199)
HDL: 61 mg/dL (ref >39)
LDL CALC: 87 mg/dL (ref 0–99)
TRIGLYCERIDES: 125 mg/dL (ref 0–149)
VLDL Cholesterol Cal: 25 mg/dL (ref 5–40)

## 2015-12-26 LAB — URIC ACID: Uric Acid: 8.8 mg/dL — ABNORMAL HIGH (ref 3.7–8.6)

## 2015-12-26 LAB — HEPATIC FUNCTION PANEL
ALBUMIN: 4.5 g/dL (ref 3.5–4.7)
ALT: 9 IU/L (ref 0–44)
AST: 18 IU/L (ref 0–40)
Alkaline Phosphatase: 67 IU/L (ref 39–117)
BILIRUBIN TOTAL: 1.9 mg/dL — AB (ref 0.0–1.2)
BILIRUBIN, DIRECT: 0.39 mg/dL (ref 0.00–0.40)
Total Protein: 6.8 g/dL (ref 6.0–8.5)

## 2016-01-08 ENCOUNTER — Other Ambulatory Visit: Payer: Self-pay | Admitting: Family Medicine

## 2016-01-13 ENCOUNTER — Encounter: Payer: Self-pay | Admitting: Family Medicine

## 2016-01-13 ENCOUNTER — Ambulatory Visit (INDEPENDENT_AMBULATORY_CARE_PROVIDER_SITE_OTHER): Payer: Medicare HMO | Admitting: Family Medicine

## 2016-01-13 VITALS — BP 128/82 | Ht 70.0 in | Wt 185.2 lb

## 2016-01-13 DIAGNOSIS — Z0000 Encounter for general adult medical examination without abnormal findings: Secondary | ICD-10-CM

## 2016-01-13 NOTE — Progress Notes (Signed)
   Subjective:    Patient ID: [name], male    DOB: March 03, 1926, 82 y.o.   MRN: [id number]  HPI AWV- Annual Wellness Visit  The patient was seen for their annual wellness visit. The patient's past medical history, surgical history, and family history were reviewed. Pertinent vaccines were reviewed ( tetanus, pneumonia, shingles, flu) The patient's medicati26on list was reviewed and updated.  The height and weight were entered. The patient's current BMI is:26  Cognitive screening was completed. Outcome of Mini - Cog: pass  Falls within the past 6 months:none  Current tobacco usage: none (All patients who use tobacco were given written and verbal information on quitting)  Recent listing of emergency department/hospitalizations over the past year were reviewed.  current specialist the patient sees on a regular basis: none  Patient reports his legs have been bothering him Medicare annual wellness visit patient questionnaire was reviewed.  A written screening schedule for the patient for the next 5-10 years was given. Appropriate discussion of followup regarding next visit was discussed.       Review of Systems  Constitutional: Negative for activity change, appetite change and fever.  HENT: Negative for congestion and rhinorrhea.   Eyes: Negative for discharge.  Respiratory: Negative for cough and wheezing.   Cardiovascular: Negative for chest pain.  Gastrointestinal: Negative for abdominal pain, blood in stool and vomiting.  Genitourinary: Negative for difficulty urinating and frequency.  Musculoskeletal: Negative for neck pain.  Skin: Negative for rash.  Allergic/Immunologic: Negative for environmental allergies and food allergies.  Neurological: Negative for weakness and headaches.  Psychiatric/Behavioral: Negative for agitation.       Objective:   Physical Exam  Constitutional: He appears well-developed and well-nourished.  HENT:  Head: Normocephalic and  atraumatic.  Right Ear: External ear normal.  Left Ear: External ear normal.  Nose: Nose normal.  Mouth/Throat: Oropharynx is clear and moist.  Eyes: EOM are normal. Pupils are equal, round, and reactive to light.  Neck: Normal range of motion. Neck supple. No thyromegaly present.  Cardiovascular: Normal rate, regular rhythm and normal heart sounds.   No murmur heard. Pulmonary/Chest: Effort normal and breath sounds normal. No respiratory distress. He has no wheezes.  Abdominal: Soft. Bowel sounds are normal. He exhibits no distension and no mass. There is no tenderness.  Genitourinary: Penis normal.  Musculoskeletal: Normal range of motion. He exhibits no edema.  Lymphadenopathy:    He has no cervical adenopathy.  Neurological: He is alert. He exhibits normal muscle tone.  Skin: Skin is warm and dry. No erythema.  Psychiatric: He has a normal mood and affect. His behavior is normal. Judgment normal.    Prostate exam deferred      Assessment & Plan:  Adult wellness-complete.wellness physical was conducted today. Importance of diet and exercise were discussed in detail. In addition to this a discussion regarding safety was also covered. We also reviewed over immunizations and gave recommendations regarding current immunization needed for age. In addition to this additional areas were also touched on including: Preventative health exams needed: Colonoscopy Colonoscopy not indicated because of age Patient doing well with taking medication recently here for a visit regarding that Follow-up within 6 months  Patient was advised yearly wellness exam Cognitively patient doing well Passes mini cog

## 2016-01-14 ENCOUNTER — Encounter: Payer: Self-pay | Admitting: Family Medicine

## 2016-02-12 DIAGNOSIS — K219 Gastro-esophageal reflux disease without esophagitis: Secondary | ICD-10-CM | POA: Diagnosis not present

## 2016-02-12 DIAGNOSIS — Z6827 Body mass index (BMI) 27.0-27.9, adult: Secondary | ICD-10-CM | POA: Diagnosis not present

## 2016-02-12 DIAGNOSIS — I10 Essential (primary) hypertension: Secondary | ICD-10-CM | POA: Diagnosis not present

## 2016-02-12 DIAGNOSIS — Z0000 Encounter for general adult medical examination without abnormal findings: Secondary | ICD-10-CM | POA: Diagnosis not present

## 2016-02-19 ENCOUNTER — Other Ambulatory Visit: Payer: Self-pay | Admitting: Family Medicine

## 2016-03-21 ENCOUNTER — Other Ambulatory Visit: Payer: Self-pay | Admitting: Family Medicine

## 2016-03-23 NOTE — Telephone Encounter (Signed)
Last rx'd 2016 45g x 2 tubes. Would you like to refill?

## 2016-04-06 ENCOUNTER — Encounter: Payer: Self-pay | Admitting: Family Medicine

## 2016-04-06 ENCOUNTER — Ambulatory Visit (INDEPENDENT_AMBULATORY_CARE_PROVIDER_SITE_OTHER): Payer: Medicare HMO | Admitting: Family Medicine

## 2016-04-06 VITALS — BP 122/82 | Ht 70.0 in | Wt 186.4 lb

## 2016-04-06 DIAGNOSIS — R1031 Right lower quadrant pain: Secondary | ICD-10-CM

## 2016-04-06 NOTE — Progress Notes (Addendum)
   Subjective:    Patient ID: [name], male    DOB: September 25, 1925, 83 y.o.   MRN: [id number]  HPI  Patient arrives with c/o dizzinessHe has had some intermittent dizziness although no loss of consciousness no falls and right side pain for four months. Patient denies constipation denies dysuria urinary frequency denies fever chills sweats. Patient has been able to hold his weight. Denies blood in the stool. States energy level fairly decent. States the discomfort in the right lower abdomen is a constant ache that's becoming more severe notices it more sometimes wakes him up at night when he wakes up in the morning it's present no particular movement or twisting makes it worse Review of Systems See above    Objective:   Physical Exam Lungs clear no crackles heart regular pulse normal abdomen is soft with some right lower quadrant tenderness and discomfort groin region is normal no hernia detected   25 minutes was spent with the patient. Greater than half the time was spent in discussion and answering questions and counseling regarding the issues that the patient came in for today. Prostate exam normal except for BPH    Assessment & Plan:  Lower abdominal discomfort present for the past 4 months increasing in progression and discomfort recommend lab work. We will more than likely also be ordering CAT scan after we get the lab work back. This is somewhat concerning for the possibility of a tumor. Is doubtful that this is a muscle strain. No sign of infection currently.

## 2016-04-07 LAB — BASIC METABOLIC PANEL
BUN / CREAT RATIO: 9 — AB (ref 10–24)
BUN: 12 mg/dL (ref 8–27)
CO2: 25 mmol/L (ref 18–29)
CREATININE: 1.32 mg/dL — AB (ref 0.76–1.27)
Calcium: 9.4 mg/dL (ref 8.6–10.2)
Chloride: 100 mmol/L (ref 96–106)
GFR calc Af Amer: 55 mL/min/{1.73_m2} — ABNORMAL LOW (ref >59)
GFR, EST NON AFRICAN AMERICAN: 48 mL/min/{1.73_m2} — AB (ref >59)
GLUCOSE: 89 mg/dL (ref 65–99)
POTASSIUM: 4.2 mmol/L (ref 3.5–5.2)
SODIUM: 142 mmol/L (ref 134–144)

## 2016-04-07 LAB — CBC WITH DIFFERENTIAL/PLATELET
BASOS: 1 %
Basophils Absolute: 0 10*3/uL (ref 0.0–0.2)
EOS (ABSOLUTE): 0.2 10*3/uL (ref 0.0–0.4)
EOS: 3 %
HEMATOCRIT: 41.3 % (ref 37.5–51.0)
HEMOGLOBIN: 13.5 g/dL (ref 13.0–17.7)
IMMATURE GRANS (ABS): 0 10*3/uL (ref 0.0–0.1)
Immature Granulocytes: 0 %
LYMPHS ABS: 1.2 10*3/uL (ref 0.7–3.1)
LYMPHS: 19 %
MCH: 32.1 pg (ref 26.6–33.0)
MCHC: 32.7 g/dL (ref 31.5–35.7)
MCV: 98 fL — ABNORMAL HIGH (ref 79–97)
MONOCYTES: 11 %
Monocytes Absolute: 0.7 10*3/uL (ref 0.1–0.9)
NEUTROS ABS: 3.9 10*3/uL (ref 1.4–7.0)
Neutrophils: 66 %
Platelets: 163 10*3/uL (ref 150–379)
RBC: 4.21 x10E6/uL (ref 4.14–5.80)
RDW: 12.7 % (ref 12.3–15.4)
WBC: 5.9 10*3/uL (ref 3.4–10.8)

## 2016-04-07 LAB — HEPATIC FUNCTION PANEL
ALK PHOS: 62 IU/L (ref 39–117)
ALT: 11 IU/L (ref 0–44)
AST: 16 IU/L (ref 0–40)
Albumin: 4.5 g/dL (ref 3.5–4.7)
BILIRUBIN, DIRECT: 0.31 mg/dL (ref 0.00–0.40)
Bilirubin Total: 1.5 mg/dL — ABNORMAL HIGH (ref 0.0–1.2)
TOTAL PROTEIN: 7.2 g/dL (ref 6.0–8.5)

## 2016-05-18 ENCOUNTER — Encounter: Payer: Self-pay | Admitting: Family Medicine

## 2016-05-18 ENCOUNTER — Ambulatory Visit (INDEPENDENT_AMBULATORY_CARE_PROVIDER_SITE_OTHER): Payer: Medicare HMO | Admitting: Family Medicine

## 2016-05-18 ENCOUNTER — Ambulatory Visit: Payer: Medicare HMO | Admitting: Family Medicine

## 2016-05-18 VITALS — BP 120/88 | Ht 70.0 in | Wt 187.5 lb

## 2016-05-18 DIAGNOSIS — R1031 Right lower quadrant pain: Secondary | ICD-10-CM

## 2016-05-18 NOTE — Progress Notes (Signed)
   Subjective:    Patient ID: [name], male    DOB: 12-08-1925, 83 y.o.   MRN: [id number]  HPI Patient is here today to discuss his blood work results. Patient states he has no other concerns at this time.  This patient was in the office in January with intermittent right lower quadrant pain and been present for the past 4 months happens on most days.  He is here for follow-up. Had bloodwork done which was reassuring the patient continues to have right lower quadrant pain and discomfort that he feels many days per week it'll ache. There is no nausea or vomiting diarrhea or bloody stools with it. It does sometimes last all day long. He denies any injury to it. Does not wake him up at night but it does happen on a very frequent basis.  He is not having any weight loss sweats or fevers  Review of Systems Please see above. Patient denies chest tightness pressure pain shortness breath    Objective:   Physical Exam  Lungs are clear hearts regular abdomen is soft with subjective right lower quadrant pain and discomfort with palpation no guarding or rebound      Assessment & Plan:  Lab work is reassuring  Persistent right lower quadrant pain for 4 months although the fact that his weight and lab work looked good is reassuring it is also quite possible that this could be a sign of something more ominous. Conservative measures have Arty been tried including stool softeners and time. I believe that the patient would benefit from having a CT scan of the abdomen and pelvis to help delineate what is causing this.

## 2016-05-25 ENCOUNTER — Ambulatory Visit (HOSPITAL_COMMUNITY): Payer: Medicare HMO

## 2016-05-27 ENCOUNTER — Ambulatory Visit (HOSPITAL_COMMUNITY)
Admission: RE | Admit: 2016-05-27 | Discharge: 2016-05-27 | Disposition: A | Discharge status: Home / Self Care | Payer: Medicare HMO | Source: Ambulatory Visit | Attending: Family Medicine | Admitting: Family Medicine

## 2016-05-27 DIAGNOSIS — I714 Abdominal aortic aneurysm, without rupture: Secondary | ICD-10-CM | POA: Diagnosis not present

## 2016-05-27 DIAGNOSIS — R911 Solitary pulmonary nodule: Secondary | ICD-10-CM | POA: Diagnosis not present

## 2016-05-27 DIAGNOSIS — I700 Atherosclerosis of aorta: Secondary | ICD-10-CM | POA: Insufficient documentation

## 2016-05-27 DIAGNOSIS — K5730 Diverticulosis of large intestine without perforation or abscess without bleeding: Secondary | ICD-10-CM | POA: Insufficient documentation

## 2016-05-27 DIAGNOSIS — R1031 Right lower quadrant pain: Secondary | ICD-10-CM | POA: Diagnosis not present

## 2016-05-27 LAB — POCT I-STAT CREATININE: Creatinine, Ser: 1.4 mg/dL — ABNORMAL HIGH (ref 0.61–1.24)

## 2016-05-27 MED ORDER — IOPAMIDOL (ISOVUE-300) INJECTION 61%
100.0000 mL | Freq: Once | INTRAVENOUS | Status: AC | PRN
  Administered 2016-05-27: 100 mL via INTRAVENOUS

## 2016-07-08 ENCOUNTER — Other Ambulatory Visit: Payer: Self-pay | Admitting: Family Medicine

## 2016-08-11 ENCOUNTER — Other Ambulatory Visit: Payer: Self-pay | Admitting: Family Medicine

## 2016-08-13 DIAGNOSIS — M79671 Pain in right foot: Secondary | ICD-10-CM | POA: Diagnosis not present

## 2016-08-13 DIAGNOSIS — L84 Corns and callosities: Secondary | ICD-10-CM | POA: Diagnosis not present

## 2016-08-13 DIAGNOSIS — I70203 Unspecified atherosclerosis of native arteries of extremities, bilateral legs: Secondary | ICD-10-CM | POA: Diagnosis not present

## 2016-08-13 DIAGNOSIS — M2041 Other hammer toe(s) (acquired), right foot: Secondary | ICD-10-CM | POA: Diagnosis not present

## 2016-08-13 DIAGNOSIS — M2042 Other hammer toe(s) (acquired), left foot: Secondary | ICD-10-CM | POA: Diagnosis not present

## 2016-08-13 DIAGNOSIS — L602 Onychogryphosis: Secondary | ICD-10-CM | POA: Diagnosis not present

## 2016-08-28 DIAGNOSIS — H26491 Other secondary cataract, right eye: Secondary | ICD-10-CM | POA: Diagnosis not present

## 2016-08-28 DIAGNOSIS — H5210 Myopia, unspecified eye: Secondary | ICD-10-CM | POA: Diagnosis not present

## 2016-08-28 DIAGNOSIS — H25812 Combined forms of age-related cataract, left eye: Secondary | ICD-10-CM | POA: Diagnosis not present

## 2016-09-24 ENCOUNTER — Emergency Department (HOSPITAL_COMMUNITY): Payer: Medicare HMO

## 2016-09-24 ENCOUNTER — Encounter (HOSPITAL_COMMUNITY): Payer: Self-pay | Admitting: *Deleted

## 2016-09-24 ENCOUNTER — Observation Stay (HOSPITAL_COMMUNITY)
Admission: EM | Admit: 2016-09-24 | Discharge: 2016-09-25 | Disposition: A | Discharge status: Home / Self Care | Payer: Medicare HMO | Attending: Internal Medicine | Admitting: Internal Medicine

## 2016-09-24 ENCOUNTER — Telehealth: Payer: Self-pay

## 2016-09-24 DIAGNOSIS — Z79899 Other long term (current) drug therapy: Secondary | ICD-10-CM | POA: Insufficient documentation

## 2016-09-24 DIAGNOSIS — I714 Abdominal aortic aneurysm, without rupture, unspecified: Secondary | ICD-10-CM | POA: Diagnosis present

## 2016-09-24 DIAGNOSIS — K219 Gastro-esophageal reflux disease without esophagitis: Secondary | ICD-10-CM | POA: Diagnosis present

## 2016-09-24 DIAGNOSIS — R079 Chest pain, unspecified: Secondary | ICD-10-CM | POA: Diagnosis present

## 2016-09-24 DIAGNOSIS — I10 Essential (primary) hypertension: Secondary | ICD-10-CM | POA: Diagnosis not present

## 2016-09-24 DIAGNOSIS — I200 Unstable angina: Secondary | ICD-10-CM | POA: Diagnosis not present

## 2016-09-24 DIAGNOSIS — R69 Illness, unspecified: Secondary | ICD-10-CM | POA: Diagnosis not present

## 2016-09-24 DIAGNOSIS — F17290 Nicotine dependence, other tobacco product, uncomplicated: Secondary | ICD-10-CM | POA: Insufficient documentation

## 2016-09-24 DIAGNOSIS — M1000 Idiopathic gout, unspecified site: Secondary | ICD-10-CM | POA: Diagnosis not present

## 2016-09-24 DIAGNOSIS — Z7982 Long term (current) use of aspirin: Secondary | ICD-10-CM | POA: Insufficient documentation

## 2016-09-24 DIAGNOSIS — M109 Gout, unspecified: Secondary | ICD-10-CM | POA: Diagnosis present

## 2016-09-24 LAB — BASIC METABOLIC PANEL
ANION GAP: 6 (ref 5–15)
BUN: 12 mg/dL (ref 6–20)
CALCIUM: 9 mg/dL (ref 8.9–10.3)
CO2: 28 mmol/L (ref 22–32)
Chloride: 105 mmol/L (ref 101–111)
Creatinine, Ser: 1.34 mg/dL — ABNORMAL HIGH (ref 0.61–1.24)
GFR, EST AFRICAN AMERICAN: 53 mL/min — AB (ref >60)
GFR, EST NON AFRICAN AMERICAN: 46 mL/min — AB (ref >60)
GLUCOSE: 118 mg/dL — AB (ref 65–99)
POTASSIUM: 3.5 mmol/L (ref 3.5–5.1)
Sodium: 139 mmol/L (ref 135–145)

## 2016-09-24 LAB — CBC
HCT: 34.8 % — ABNORMAL LOW (ref 39.0–52.0)
HEMATOCRIT: 37.5 % — AB (ref 39.0–52.0)
HEMOGLOBIN: 12.3 g/dL — AB (ref 13.0–17.0)
HEMOGLOBIN: 11.6 g/dL — AB (ref 13.0–17.0)
MCH: 32.1 pg (ref 26.0–34.0)
MCH: 32.6 pg (ref 26.0–34.0)
MCHC: 32.8 g/dL (ref 30.0–36.0)
MCHC: 33.3 g/dL (ref 30.0–36.0)
MCV: 97.9 fL (ref 78.0–100.0)
MCV: 97.8 fL (ref 78.0–100.0)
Platelets: 127 10*3/uL — ABNORMAL LOW (ref 150–400)
Platelets: 121 10*3/uL — ABNORMAL LOW (ref 150–400)
RBC: 3.83 MIL/uL — AB (ref 4.22–5.81)
RBC: 3.56 MIL/uL — AB (ref 4.22–5.81)
RDW: 12.3 % (ref 11.5–15.5)
RDW: 12.4 % (ref 11.5–15.5)
WBC: 4.4 10*3/uL (ref 4.0–10.5)
WBC: 4.4 10*3/uL (ref 4.0–10.5)

## 2016-09-24 LAB — TROPONIN I
Troponin I: <0.03 ng/mL (ref <0.03)
Troponin I: <0.03 ng/mL (ref <0.03)

## 2016-09-24 LAB — CREATININE, SERUM
CREATININE: 1.4 mg/dL — AB (ref 0.61–1.24)
GFR, EST AFRICAN AMERICAN: 50 mL/min — AB (ref >60)
GFR, EST NON AFRICAN AMERICAN: 43 mL/min — AB (ref >60)

## 2016-09-24 LAB — I-STAT TROPONIN, ED: TROPONIN I, POC: 0 ng/mL (ref 0.00–0.08)

## 2016-09-24 LAB — D-DIMER, QUANTITATIVE (NOT AT ARMC): D DIMER QUANT: 0.71 ug{FEU}/mL — AB (ref 0.00–0.50)

## 2016-09-24 MED ORDER — ASPIRIN EC 81 MG PO TBEC
81.0000 mg | DELAYED_RELEASE_TABLET | Freq: Every day | ORAL | Status: DC
  Administered 2016-09-25: 81 mg via ORAL
  Filled 2016-09-24: qty 1

## 2016-09-24 MED ORDER — MORPHINE SULFATE (PF) 2 MG/ML IV SOLN: 2.0000 mg | INTRAVENOUS | Status: DC | PRN

## 2016-09-24 MED ORDER — SODIUM CHLORIDE 0.45 % IV SOLN
INTRAVENOUS | Status: DC
  Administered 2016-09-24: 16:00:00 via INTRAVENOUS

## 2016-09-24 MED ORDER — NAPROXEN 250 MG PO TABS: 500.0000 mg | ORAL_TABLET | Freq: Two times a day (BID) | ORAL | Status: DC | PRN

## 2016-09-24 MED ORDER — ENOXAPARIN SODIUM 40 MG/0.4ML ~~LOC~~ SOLN
40.0000 mg | SUBCUTANEOUS | Status: DC
  Administered 2016-09-24: 40 mg via SUBCUTANEOUS
  Filled 2016-09-24: qty 0.4

## 2016-09-24 MED ORDER — NITROGLYCERIN 0.4 MG SL SUBL: 0.4000 mg | SUBLINGUAL_TABLET | SUBLINGUAL | Status: DC | PRN

## 2016-09-24 MED ORDER — GUAIFENESIN ER 600 MG PO TB12
600.0000 mg | ORAL_TABLET | Freq: Two times a day (BID) | ORAL | Status: DC
  Administered 2016-09-24 – 2016-09-25 (×2): 600 mg via ORAL
  Filled 2016-09-24 (×2): qty 1

## 2016-09-24 MED ORDER — FAMOTIDINE 20 MG PO TABS
20.0000 mg | ORAL_TABLET | Freq: Two times a day (BID) | ORAL | Status: DC
Start: 2016-09-24 — End: 2016-09-25
  Administered 2016-09-24 – 2016-09-25 (×2): 20 mg via ORAL
  Filled 2016-09-24 (×2): qty 1

## 2016-09-24 MED ORDER — ONDANSETRON HCL 4 MG/2ML IJ SOLN: 4.0000 mg | Freq: Four times a day (QID) | INTRAMUSCULAR | Status: DC | PRN

## 2016-09-24 MED ORDER — ACETAMINOPHEN 325 MG PO TABS: 650.0000 mg | ORAL_TABLET | ORAL | Status: DC | PRN

## 2016-09-24 MED ORDER — POTASSIUM CHLORIDE CRYS ER 10 MEQ PO TBCR
10.0000 meq | EXTENDED_RELEASE_TABLET | Freq: Every day | ORAL | Status: DC
  Administered 2016-09-25: 10 meq via ORAL
  Filled 2016-09-24: qty 1

## 2016-09-24 MED ORDER — GI COCKTAIL ~~LOC~~: 30.0000 mL | Freq: Four times a day (QID) | ORAL | Status: DC | PRN

## 2016-09-24 MED ORDER — ALLOPURINOL 300 MG PO TABS
300.0000 mg | ORAL_TABLET | Freq: Every day | ORAL | Status: DC
  Administered 2016-09-25: 300 mg via ORAL
  Filled 2016-09-24: qty 1

## 2016-09-24 NOTE — ED Provider Notes (Signed)
[facility] DEPT Provider Note   CSN: [id number] Arrival date & time: 09/24/16  0935     History   Chief Complaint Chief Complaint  Patient presents with  . Chest Pain    HPI [name] is a 83 y.o. male.  Pt presents to the ED today with intermittent CP.  The pt said that the pain started 3 days ago.  He finally called his doctor's office this morning, and was told to go to the ED.  The pt did take ASA 325 mg this morning.  Pt denies any sob or n/v.      Past Medical History:  Diagnosis Date  . Diverticulosis   . GERD (gastroesophageal reflux disease)   . Gout   . Heart murmur    Evaluation by a cardiologist years ago was reportedly negative  . HTN (hypertension)   . Osteoarthritis   . Reflux     Patient Active Problem List   Diagnosis Date Noted  . Cognitive dysfunction 12/27/2014  . Hyperlipidemia 12/27/2014  . Gout 01/11/2014  . HTN (hypertension) 01/11/2014  . Anemia, iron deficiency 04/03/2013  . Loss of weight 03/20/2013  . S/P total knee replacement 09/16/2010  . GASTROESOPHAGEAL REFLUX DISEASE 05/08/2010  . DIVERTICULOSIS, COLON 05/08/2010  . TOBACCO ABUSE 05/08/2010  . KNEE, ARTHRITIS, DEGEN./OSTEO 04/05/2008    Past Surgical History:  Procedure Laterality Date  . APPENDECTOMY  2007  . COLONOSCOPY  2003  . COLONOSCOPY N/A 12/28/2012   Procedure: COLONOSCOPY;  Surgeon: Rogene Houston, MD;  Location: [facility] SUITE;  Service: Endoscopy;  Laterality: N/A;  255-rescheduled to 10:30 Ann notified pt  . EYE SURGERY Right    cataract removal  . INGUINAL HERNIA REPAIR     Right  . JOINT REPLACEMENT     right total knee RIGHT total knee arthroplasty  . TOTAL KNEE ARTHROPLASTY  09/2009   Right       Home Medications    Prior to Admission medications   Medication Sig Start Date End Date Taking? Authorizing Provider  acetaminophen (TYLENOL) 650 MG CR tablet Take 650-1,300 mg by mouth every 8 (eight) hours as needed for pain.   Yes  [provider]  aspirin EC 81 MG tablet Take 81 mg by mouth daily.   Yes [provider]  potassium chloride (K-DUR,KLOR-CON) 10 MEQ tablet TAKE ONE TABLET BY MOUTH ONCE DAILY. 07/09/16  Yes Luking, Elayne Snare, MD  PROAIR HFA 108 [phone] Base) MCG/ACT inhaler INHALE 2 PUFFS EVERY 4 HOURS AS NEEDED. 02/19/16  Yes Luking, Elayne Snare, MD  ranitidine (ZANTAC) 150 MG tablet TAKE (1) TABLET BY MOUTH TWICE DAILY. Patient taking differently: TAKE (1) TABLET BY MOUTH AT NIGHT 08/11/16  Yes Luking, Scott A, MD  torsemide (DEMADEX) 20 MG tablet TAKE 1/2 TABLET BY MOUTH EVERY MORNING. 07/09/16  Yes Luking, Elayne Snare, MD  allopurinol (ZYLOPRIM) 300 MG tablet TAKE ONE TABLET DAILY FOR GOUT. Patient not taking: Reported on 05/18/2016 10/16/14   [name], MD  docusate sodium (COLACE) 100 MG capsule Take 2 capsules (200 mg total) by mouth at bedtime. Patient not taking: Reported on 09/24/2016 12/28/12   Rogene Houston, MD  HYDROcodone-acetaminophen (NORCO/VICODIN) 5-325 MG tablet Take 1 tablet by mouth every 6 (six) hours as needed for moderate pain. Patient not taking: Reported on 09/24/2016 11/12/15   [name], MD  mometasone (ELOCON) 0.1 % cream APPLY TO AFFECTED AREAS TWICE DAILY FOR 3 WEEKS. 03/23/16   [name], MD  naproxen (NAPROSYN) 500 MG tablet Take 1 tablet (500 mg total) by mouth 2 (two) times daily with a meal. Patient not taking: Reported on 09/24/2016 11/12/15   [name], MD  triamcinolone cream (KENALOG) 0.1 % APPLY TO AFFECTED AREAS TWICE DAILY AS NEEDED. 02/03/13   Mikey Kirschner, MD    Family History Family History  Problem Relation Age of Onset  . Hypertension Mother   . Hypertension Father   . Heart attack Father   . Cancer Brother        colon  . Arthritis Other   . Heart disease Other        Male < 55  . Uterine cancer Maternal Aunt     Social History Social History  Substance Use Topics  . Smoking status: Current Some Day Smoker    Types: Pipe  .  Smokeless tobacco: Never Used  . Alcohol use No     Allergies   Penicillins   Review of Systems Review of Systems  Cardiovascular: Positive for chest pain.  All other systems reviewed and are negative.    Physical Exam Updated Vital Signs BP (!) 144/81   Pulse 61   Temp 97.9 F (36.6 C) (Oral)   Resp 16   Ht 5\' 11"  (1.803 m)   Wt 82.1 kg (181 lb)   SpO2 97%   BMI 25.24 kg/m   Physical Exam  Constitutional: He is oriented to person, place, and time. He appears well-developed and well-nourished.  HENT:  Head: Normocephalic and atraumatic.  Right Ear: External ear normal.  Left Ear: External ear normal.  Nose: Nose normal.  Mouth/Throat: Oropharynx is clear and moist.  Eyes: Pupils are equal, round, and reactive to light. Conjunctivae and EOM are normal.  Neck: Normal range of motion. Neck supple.  Cardiovascular: Normal rate, regular rhythm, normal heart sounds and intact distal pulses.   Pulmonary/Chest: Effort normal and breath sounds normal.  Abdominal: Soft. Bowel sounds are normal.  Musculoskeletal: Normal range of motion.  Neurological: He is alert and oriented to person, place, and time.  Skin: Skin is warm.  Psychiatric: He has a normal mood and affect. His behavior is normal. Judgment and thought content normal.  Nursing note and vitals reviewed.    ED Treatments / Results  Labs (all labs ordered are listed, but only abnormal results are displayed) Labs Reviewed  BASIC METABOLIC PANEL - Abnormal; Notable for the following:       Result Value   Glucose, Bld 118 (*)    Creatinine, Ser 1.34 (*)    GFR calc non Af Amer 46 (*)    GFR calc Af Amer 53 (*)    All other components within normal limits  CBC - Abnormal; Notable for the following:    RBC 3.83 (*)    Hemoglobin 12.3 (*)    HCT 37.5 (*)    Platelets 127 (*)    All other components within normal limits  I-STAT TROPOININ, ED    EKG  EKG Interpretation  Date/Time:  Thursday September 24 2016  09:46:50 EDT Ventricular Rate:  83 PR Interval:    QRS Duration: 79 QT Interval:  421 QTC Calculation: 438 R Axis:   59 Text Interpretation:  Sinus rhythm Atrial premature complexes Borderline repolarization abnormality Confirmed by Isla Pence [phone]) on 09/24/2016 9:56:52 AM       Radiology Dg Chest 2 View  Result Date: 09/24/2016 CLINICAL DATA:  Intermittent right side chest pain EXAM: CHEST  2  VIEW COMPARISON:  03/24/2013 FINDINGS: There is hyperinflation of the lungs compatible with COPD. Bibasilar scarring. Heart is normal size. No effusions or confluent opacities. No acute bony abnormality. IMPRESSION: COPD.  Bibasilar scarring.  No active disease. Electronically Signed   By: Rolm Baptise M.D.   On: 09/24/2016 10:32    Procedures Procedures (including critical care time)  Medications Ordered in ED Medications  nitroGLYCERIN (NITROSTAT) SL tablet 0.4 mg (not administered)     Initial Impression / Assessment and Plan / ED Course  I have reviewed the triage vital signs and the nursing notes.  Pertinent labs & imaging results that were available during my care of the patient were reviewed by me and considered in my medical decision making (see chart for details).    No CP now. No cardiac work up in the past.  Pt d/w Dr. Roderic Palau (triad) who will admit him for observation.  Final Clinical Impressions(s) / ED Diagnoses   Final diagnoses:  Unstable angina [facility])    New Prescriptions New Prescriptions   No medications on file     Isla Pence, MD 09/24/16 1103

## 2016-09-24 NOTE — Telephone Encounter (Signed)
Patient wife and daughter called today stating the patient was having chest and right arm pain for two to three days. I spoke with the patient and he states this started while he was outside cutting the hedges. He states he does not have Left arm pain ,but has right arm pain that radiates up his neck. He and his wife are aware that he needs to report to the emergency room.

## 2016-09-24 NOTE — ED Triage Notes (Signed)
Pt c/o intermittent right sided chest pain that radiates to right shoulder and right back that started Tuesday after he was working out in the yard. Pt reports SOB. Denies nausea, dizziness. Pt describes that pain as "grabbing".

## 2016-09-24 NOTE — H&P (Signed)
History and Physical    [name]:[id number] DOB: 1926/01/10 DOA: 09/24/2016  PCP: [name], MD  Patient coming from: Home  I have personally briefly reviewed patient's old medical records in [place]  Chief Complaint: Right-sided chest discomfort  HPI: [name] is a 83 y.o. male with medical history significant of hypertension, abdominal aortic aneurysm, GERD, presents to the hospital complaints of right-sided chest pain. He's been having intermittent pain for the past several days which he reports radiates to his right shoulder. This is worse when he moves his arm in certain positions. He has no associated shortness of breath. No nausea, vomiting. He does describe night sweats, but does not feel that his chest discomfort related to diaphoresis specifically. Does not have any lightheadedness or dizziness. He reports working in his yard for the past few days, trimming hedges which may be contributing to his discomfort. He does not have any discomfort at this time. Wife reports that he has a chronic productive cough. No fever. He reports having a stress test in the past, but cannot tell me when it was exactly done. Review of records does not indicate any study in our system.  ED Course: He was noted to be mildly hypertensive. EKG did not show any acute changes. Troponin was found to be negative. Remainder of lab work is unremarkable. Chest x-ray does not show any evidence of pneumonia. He is referred for admission.  Review of Systems: As per HPI otherwise 10 point review of systems negative.    Past Medical History:  Diagnosis Date  . Diverticulosis   . GERD (gastroesophageal reflux disease)   . Gout   . Heart murmur    Evaluation by a cardiologist years ago was reportedly negative  . HTN (hypertension)   . Osteoarthritis   . Reflux     Past Surgical History:  Procedure Laterality Date  . APPENDECTOMY  2007  . COLONOSCOPY  2003  . COLONOSCOPY  N/A 12/28/2012   Procedure: COLONOSCOPY;  Surgeon: Rogene Houston, MD;  Location: [facility];  Service: Endoscopy;  Laterality: N/A;  255-rescheduled to 10:30 Ann notified pt  . EYE SURGERY Right    cataract removal  . INGUINAL HERNIA REPAIR     Right  . JOINT REPLACEMENT     right total knee RIGHT total knee arthroplasty  . TOTAL KNEE ARTHROPLASTY  09/2009   Right     reports that he has been smoking Pipe.  He has never used smokeless tobacco. He reports that he does not drink alcohol or use drugs.  Allergies  Allergen Reactions  . Penicillins Rash    Has patient had a PCN reaction causing immediate rash, facial/tongue/throat swelling, SOB or lightheadedness with hypotension: Yes Has patient had a PCN reaction causing severe rash involving mucus membranes or skin necrosis: No Has patient had a PCN reaction that required hospitalization: No Has patient had a PCN reaction occurring within the last 10 years: No If all of the above answers are "NO", then may proceed with Cephalosporin use.     Family History  Problem Relation Age of Onset  . Hypertension Mother   . Hypertension Father   . Heart attack Father   . Cancer Brother        colon  . Arthritis Other   . Heart disease Other        Male < 55  . Uterine cancer Maternal Aunt      Prior to Admission  medications   Medication Sig Start Date End Date Taking? Authorizing Provider  acetaminophen (TYLENOL) 650 MG CR tablet Take 650-1,300 mg by mouth every 8 (eight) hours as needed for pain.   Yes [provider]  aspirin EC 81 MG tablet Take 81 mg by mouth daily.   Yes [provider]  mometasone (ELOCON) 0.1 % cream APPLY TO AFFECTED AREAS TWICE DAILY FOR 3 WEEKS. 03/23/16  Yes Luking, Elayne Snare, MD  potassium chloride (K-DUR,KLOR-CON) 10 MEQ tablet TAKE ONE TABLET BY MOUTH ONCE DAILY. 07/09/16  Yes Luking, Elayne Snare, MD  PROAIR HFA 108 [phone] Base) MCG/ACT inhaler INHALE 2 PUFFS EVERY 4 HOURS AS NEEDED. 02/19/16   Yes Luking, Elayne Snare, MD  ranitidine (ZANTAC) 150 MG tablet TAKE (1) TABLET BY MOUTH TWICE DAILY. Patient taking differently: TAKE (1) TABLET BY MOUTH AT NIGHT 08/11/16  Yes Luking, Scott A, MD  torsemide (DEMADEX) 20 MG tablet TAKE 1/2 TABLET BY MOUTH EVERY MORNING. 07/09/16  Yes Luking, Elayne Snare, MD  allopurinol (ZYLOPRIM) 300 MG tablet TAKE ONE TABLET DAILY FOR GOUT. Patient not taking: Reported on 05/18/2016 10/16/14   [name], MD  docusate sodium (COLACE) 100 MG capsule Take 2 capsules (200 mg total) by mouth at bedtime. Patient not taking: Reported on 09/24/2016 12/28/12   Rogene Houston, MD  HYDROcodone-acetaminophen (NORCO/VICODIN) 5-325 MG tablet Take 1 tablet by mouth every 6 (six) hours as needed for moderate pain. Patient not taking: Reported on 09/24/2016 11/12/15   [name], MD  naproxen (NAPROSYN) 500 MG tablet Take 1 tablet (500 mg total) by mouth 2 (two) times daily with a meal. Patient not taking: Reported on 09/24/2016 11/12/15   [name], MD  triamcinolone cream (KENALOG) 0.1 % APPLY TO AFFECTED AREAS TWICE DAILY AS NEEDED. Patient not taking: Reported on 09/24/2016 02/03/13   Mikey Kirschner, MD    Physical Exam: Vitals:   09/24/16 1000 09/24/16 1030 09/24/16 1130 09/24/16 1200  BP: (!) 144/81 (!) 148/94 (!) 169/92 (!) 152/97  Pulse: 61 62 (!) 59 (!) 57  Resp: 16 17 15 17   Temp:      TempSrc:      SpO2: 97% 94% 98% 98%  Weight:      Height:        Constitutional: NAD, calm, comfortable Vitals:   09/24/16 1000 09/24/16 1030 09/24/16 1130 09/24/16 1200  BP: (!) 144/81 (!) 148/94 (!) 169/92 (!) 152/97  Pulse: 61 62 (!) 59 (!) 57  Resp: 16 17 15 17   Temp:      TempSrc:      SpO2: 97% 94% 98% 98%  Weight:      Height:       Eyes: PERRL, lids and conjunctivae normal ENMT: Mucous membranes are moist. Posterior pharynx clear of any exudate or lesions.Normal dentition.  Neck: normal, supple, no masses, no thyromegaly Respiratory: clear to  auscultation bilaterally, no wheezing, no crackles. Normal respiratory effort. No accessory muscle use.  Cardiovascular: Regular rate and rhythm, no murmurs / rubs / gallops. No extremity edema. 2+ pedal pulses. No carotid bruits.  Abdomen: no tenderness, no masses palpated. No hepatosplenomegaly. Bowel sounds positive.  Musculoskeletal: no clubbing / cyanosis. No joint deformity upper and lower extremities. Good ROM, no contractures. Normal muscle tone.  Skin: no rashes, lesions, ulcers. No induration Neurologic: CN 2-12 grossly intact. Sensation intact, DTR normal. Strength 5/5 in all 4.  Psychiatric: Normal judgment and insight. Alert and oriented x 3. Normal mood.  Labs on Admission: I have personally reviewed following labs and imaging studies  CBC:  Recent Labs Lab 09/24/16 0949  WBC 4.4  HGB 12.3*  HCT 37.5*  MCV 97.9  PLT 580*   Basic Metabolic Panel:  Recent Labs Lab 09/24/16 0949  NA 139  K 3.5  CL 105  CO2 28  GLUCOSE 118*  BUN 12  CREATININE 1.34*  CALCIUM 9.0   GFR: Estimated Creatinine Clearance: 40.6 mL/min (A) (by C-G formula based on SCr of 1.34 mg/dL (H)). Liver Function Tests: No results for input(s): AST, ALT, ALKPHOS, BILITOT, PROT, ALBUMIN in the last 168 hours. No results for input(s): LIPASE, AMYLASE in the last 168 hours. No results for input(s): AMMONIA in the last 168 hours. Coagulation Profile: No results for input(s): INR, PROTIME in the last 168 hours. Cardiac Enzymes: No results for input(s): CKTOTAL, CKMB, CKMBINDEX, TROPONINI in the last 168 hours. BNP (last 3 results) No results for input(s): PROBNP in the last 8760 hours. HbA1C: No results for input(s): HGBA1C in the last 72 hours. CBG: No results for input(s): GLUCAP in the last 168 hours. Lipid Profile: No results for input(s): CHOL, HDL, LDLCALC, TRIG, CHOLHDL, LDLDIRECT in the last 72 hours. Thyroid Function Tests: No results for input(s): TSH, T4TOTAL, FREET4, T3FREE,  THYROIDAB in the last 72 hours. Anemia Panel: No results for input(s): VITAMINB12, FOLATE, FERRITIN, TIBC, IRON, RETICCTPCT in the last 72 hours. Urine analysis: No results found for: COLORURINE, APPEARANCEUR, LABSPEC, PHURINE, GLUCOSEU, HGBUR, BILIRUBINUR, KETONESUR, PROTEINUR, UROBILINOGEN, NITRITE, LEUKOCYTESUR  Radiological Exams on Admission: Dg Chest 2 View  Result Date: 09/24/2016 CLINICAL DATA:  Intermittent right side chest pain EXAM: CHEST  2 VIEW COMPARISON:  03/24/2013 FINDINGS: There is hyperinflation of the lungs compatible with COPD. Bibasilar scarring. Heart is normal size. No effusions or confluent opacities. No acute bony abnormality. IMPRESSION: COPD.  Bibasilar scarring.  No active disease. Electronically Signed   By: Rolm Baptise M.D.   On: 09/24/2016 10:32    EKG: Independently reviewed. Sinus rhythm without any acute ST changes.  Assessment/Plan Active Problems:   GASTROESOPHAGEAL REFLUX DISEASE   Gout   HTN (hypertension)   Chest pain   AAA (abdominal aortic aneurysm) ([place])    1. Atypical chest pain. Suspect that he likely has a muscular component contributing to his chest discomfort. We'll cycle cardiac markers, monitor on telemetry. Check d-dimer. If workup is negative, will consider outpatient cardiology follow-up. 2. GERD. Continue on H2 blockers. 3. Hypertension. Appears stable. Continue current treatments. 4. Abdominal aortic aneurysm. Recently found on CT from 05/2016. Continue outpatient surveillance. 5. History of gout. Continue allopurinol.  DVT prophylaxis: lovenox Code Status: full code Family Communication: discussed with multiple family members at the bedside Disposition Plan: discharge home once improved Consults called: Admission status: observation, telemetry   Stacey Maura MD [facility] Pager [phone]  If 7PM-7AM, please contact night-coverage [email or web address] Password [facility]  09/24/2016, 3:47 PM

## 2016-09-24 NOTE — Care Management Obs Status (Signed)
[place] NOTIFICATION   Patient Details  Name: [name] MRN: [id number] Date of Birth: Jan 31, 1926   Medicare Observation Status Notification Given:  Yes    Sherald Barge, RN 09/24/2016, 1:07 PM

## 2016-09-25 DIAGNOSIS — R079 Chest pain, unspecified: Secondary | ICD-10-CM | POA: Diagnosis not present

## 2016-09-25 DIAGNOSIS — K219 Gastro-esophageal reflux disease without esophagitis: Secondary | ICD-10-CM | POA: Diagnosis not present

## 2016-09-25 DIAGNOSIS — M1000 Idiopathic gout, unspecified site: Secondary | ICD-10-CM | POA: Diagnosis not present

## 2016-09-25 DIAGNOSIS — I714 Abdominal aortic aneurysm, without rupture: Secondary | ICD-10-CM | POA: Diagnosis not present

## 2016-09-25 DIAGNOSIS — I10 Essential (primary) hypertension: Secondary | ICD-10-CM | POA: Diagnosis not present

## 2016-09-25 MED ORDER — GUAIFENESIN ER 600 MG PO TB12: 600.0000 mg | ORAL_TABLET | Freq: Two times a day (BID) | ORAL | Status: DC | PRN

## 2016-09-25 NOTE — Discharge Summary (Signed)
Physician Discharge Summary  [name] OVF:[id number] DOB: August 21, 1925 DOA: 09/24/2016  PCP: [name], MD  Admit date: 09/24/2016 Discharge date: 09/25/2016  Admitted From: home Disposition:  home  Recommendations for Outpatient Follow-up:  1. Follow up with PCP in 1-2 weeks 2. Patient has been referred to cardiology clinic for follow up  Home Health: Equipment/Devices:  Discharge Condition: stable CODE STATUS: full code Diet recommendation: Heart Healthy  Brief/Interim Summary: 83 -year-old male with history of hypertension, abdominal aortic aneurysm GERD, presents to the hospital with complaints of right-sided chest pain. Patient was found to have atypical chest pain. He was monitored in the hospital and ruled out for ACS with negative cardiac markers. EKG was nonacute. He did not have any recurrence of pain since admission. He is not having any shortness of breath and is able to ambulate without difficulty. It was likely that his pain was musculoskeletal, related to yard work that he had performed recently. His case was discussed with Dr. Harl Bowie who agreed that outpatient cardiology follow-up may be appropriate. He has been referred to follow-up with cardiology as an outpatient. The remainder of his medical issues remained stable. Patient is otherwise stable for discharge.  Discharge Diagnoses:  Active Problems:   GASTROESOPHAGEAL REFLUX DISEASE   Gout   HTN (hypertension)   Chest pain   AAA (abdominal aortic aneurysm) [facility])    Discharge Instructions  Discharge Instructions    Diet - low sodium heart healthy    Complete by:  As directed    Increase activity slowly    Complete by:  As directed      Allergies as of 09/25/2016      Reactions   Penicillins Rash   Has patient had a PCN reaction causing immediate rash, facial/tongue/throat swelling, SOB or lightheadedness with hypotension: Yes Has patient had a PCN reaction causing severe rash involving mucus  membranes or skin necrosis: No Has patient had a PCN reaction that required hospitalization: No Has patient had a PCN reaction occurring within the last 10 years: No If all of the above answers are "NO", then may proceed with Cephalosporin use.      Medication List    STOP taking these medications   HYDROcodone-acetaminophen 5-325 MG tablet Commonly known as:  NORCO/VICODIN   naproxen 500 MG tablet Commonly known as:  NAPROSYN     TAKE these medications   acetaminophen 650 MG CR tablet Commonly known as:  TYLENOL Take 650-1,300 mg by mouth every 8 (eight) hours as needed for pain.   allopurinol 300 MG tablet Commonly known as:  ZYLOPRIM TAKE ONE TABLET DAILY FOR GOUT.   aspirin EC 81 MG tablet Take 81 mg by mouth daily.   docusate sodium 100 MG capsule Commonly known as:  COLACE Take 2 capsules (200 mg total) by mouth at bedtime.   guaiFENesin 600 MG 12 hr tablet Commonly known as:  MUCINEX Take 1 tablet (600 mg total) by mouth 2 (two) times daily as needed.   mometasone 0.1 % cream Commonly known as:  ELOCON APPLY TO AFFECTED AREAS TWICE DAILY FOR 3 WEEKS.   potassium chloride 10 MEQ tablet Commonly known as:  K-DUR,KLOR-CON TAKE ONE TABLET BY MOUTH ONCE DAILY.   PROAIR HFA 108 (90 Base) MCG/ACT inhaler Generic drug:  albuterol INHALE 2 PUFFS EVERY 4 HOURS AS NEEDED.   ranitidine 150 MG tablet Commonly known as:  ZANTAC TAKE (1) TABLET BY MOUTH TWICE DAILY. What changed:  See the new instructions.  torsemide 20 MG tablet Commonly known as:  DEMADEX TAKE 1/2 TABLET BY MOUTH EVERY MORNING.   triamcinolone cream 0.1 % Commonly known as:  KENALOG APPLY TO AFFECTED AREAS TWICE DAILY AS NEEDED.      Follow-up Information    Luking, Elayne Snare, MD. Schedule an appointment as soon as possible for a visit in 2 week(s).   Specialty:  Family Medicine Contact information: [place] [address] [phone]          Allergies   Allergen Reactions  . Penicillins Rash    Has patient had a PCN reaction causing immediate rash, facial/tongue/throat swelling, SOB or lightheadedness with hypotension: Yes Has patient had a PCN reaction causing severe rash involving mucus membranes or skin necrosis: No Has patient had a PCN reaction that required hospitalization: No Has patient had a PCN reaction occurring within the last 10 years: No If all of the above answers are "NO", then may proceed with Cephalosporin use.     Consultations:     Procedures/Studies: Dg Chest 2 View  Result Date: 09/24/2016 CLINICAL DATA:  Intermittent right side chest pain EXAM: CHEST  2 VIEW COMPARISON:  03/24/2013 FINDINGS: There is hyperinflation of the lungs compatible with COPD. Bibasilar scarring. Heart is normal size. No effusions or confluent opacities. No acute bony abnormality. IMPRESSION: COPD.  Bibasilar scarring.  No active disease. Electronically Signed   By: Rolm Baptise M.D.   On: 09/24/2016 10:32       Subjective: No further chest discomfort  Discharge Exam: Vitals:   09/24/16 2210 09/25/16 0526  BP: 137/65 (!) 141/74  Pulse: (!) 57 (!) 55  Resp: 20 20  Temp: 97.9 F (36.6 C) 97.9 F (36.6 C)   Vitals:   09/24/16 1200 09/24/16 2005 09/24/16 2210 09/25/16 0526  BP: (!) 152/97  137/65 (!) 141/74  Pulse: (!) 57  (!) 57 (!) 55  Resp: 17  20 20   Temp:   97.9 F (36.6 C) 97.9 F (36.6 C)  TempSrc:   Oral Oral  SpO2: 98% 97% 100% 100%  Weight:      Height:        General: Pt is alert, awake, not in acute distress Cardiovascular: RRR, S1/S2 +, no rubs, no gallops Respiratory: CTA bilaterally, no wheezing, no rhonchi Abdominal: Soft, NT, ND, bowel sounds + Extremities: no edema, no cyanosis    The results of significant diagnostics from this hospitalization (including imaging, microbiology, ancillary and laboratory) are listed below for reference.     Microbiology: No results found for this or any  previous visit (from the past 240 hour(s)).   Labs: BNP (last 3 results) No results for input(s): BNP in the last 8760 hours. Basic Metabolic Panel:  Recent Labs Lab 09/24/16 0949 09/24/16 1617  NA 139  --   K 3.5  --   CL 105  --   CO2 28  --   GLUCOSE 118*  --   BUN 12  --   CREATININE 1.34* 1.40*  CALCIUM 9.0  --    Liver Function Tests: No results for input(s): AST, ALT, ALKPHOS, BILITOT, PROT, ALBUMIN in the last 168 hours. No results for input(s): LIPASE, AMYLASE in the last 168 hours. No results for input(s): AMMONIA in the last 168 hours. CBC:  Recent Labs Lab 09/24/16 0949 09/24/16 1617  WBC 4.4 4.4  HGB 12.3* 11.6*  HCT 37.5* 34.8*  MCV 97.9 97.8  PLT 127* 121*   Cardiac Enzymes:  Recent Labs Lab 09/24/16 1617 09/24/16 1844 09/24/16 2135  TROPONINI <0.03 <0.03 <0.03   BNP: Invalid input(s): POCBNP CBG: No results for input(s): GLUCAP in the last 168 hours. D-Dimer  Recent Labs  09/24/16 1617  DDIMER 0.71*   Hgb A1c No results for input(s): HGBA1C in the last 72 hours. Lipid Profile No results for input(s): CHOL, HDL, LDLCALC, TRIG, CHOLHDL, LDLDIRECT in the last 72 hours. Thyroid function studies No results for input(s): TSH, T4TOTAL, T3FREE, THYROIDAB in the last 72 hours.  Invalid input(s): FREET3 Anemia work up No results for input(s): VITAMINB12, FOLATE, FERRITIN, TIBC, IRON, RETICCTPCT in the last 72 hours. Urinalysis No results found for: COLORURINE, APPEARANCEUR, LABSPEC, [place], GLUCOSEU, HGBUR, BILIRUBINUR, KETONESUR, PROTEINUR, UROBILINOGEN, NITRITE, LEUKOCYTESUR Sepsis Labs Invalid input(s): PROCALCITONIN,  WBC,  LACTICIDVEN Microbiology No results found for this or any previous visit (from the past 240 hour(s)).   Time coordinating discharge: Over 30 minutes  SIGNED:   Kathie Dike, MD  [facility] 09/25/2016, 1:36 PM Pager   If 7PM-7AM, please contact night-coverage [email or web address] Password [email or web address]

## 2016-09-25 NOTE — Care Management Note (Signed)
Case Management Note  Patient Details  Name: [name] MRN: [id number] Date of Birth: 1925/08/18  Subjective/Objective:                  Admitted for r/o CP. Pt is from home with his wife. He is very independent. He has PCP, the drives, he has insurance with drug coverage and no difficulty affording or managing medications. He communicates no needs.   Action/Plan: Plan for DC home with self care.   Expected Discharge Date:  09/25/16               Expected Discharge Plan:  Home/Self Care  In-House Referral:  NA  Discharge planning Services  CM Consult  Post Acute Care Choice:  NA Choice offered to:  NA  Status of Service:  Completed, signed off  Sherald Barge, RN 09/25/2016, 10:46 AM

## 2016-09-25 NOTE — Progress Notes (Signed)
Patient discharged with instructions, prescription, and care notes.  Verbalized understanding via teach back.  IV was removed and the site was WNL. Patient voiced no further complaints or concerns at the time of discharge.  Appointments scheduled per instructions.  Patient left the floor via w/c family  And staff in stable condition. 

## 2016-10-02 ENCOUNTER — Encounter: Payer: Self-pay | Admitting: Internal Medicine

## 2016-10-02 ENCOUNTER — Ambulatory Visit (INDEPENDENT_AMBULATORY_CARE_PROVIDER_SITE_OTHER): Payer: Medicare HMO | Admitting: Internal Medicine

## 2016-10-02 VITALS — BP 162/96 | HR 75 | Ht 68.0 in | Wt 184.0 lb

## 2016-10-02 DIAGNOSIS — I714 Abdominal aortic aneurysm, without rupture, unspecified: Secondary | ICD-10-CM

## 2016-10-02 DIAGNOSIS — R079 Chest pain, unspecified: Secondary | ICD-10-CM | POA: Diagnosis not present

## 2016-10-02 DIAGNOSIS — I10 Essential (primary) hypertension: Secondary | ICD-10-CM | POA: Diagnosis not present

## 2016-10-02 DIAGNOSIS — E782 Mixed hyperlipidemia: Secondary | ICD-10-CM

## 2016-10-02 MED ORDER — ROSUVASTATIN CALCIUM 20 MG PO TABS: 20.0000 mg | ORAL_TABLET | Freq: Every day | ORAL | 3 refills | Status: DC

## 2016-10-02 NOTE — Progress Notes (Signed)
Cardiology Office Note   Date:  10/02/2016   ID:  DALLAN SCHONBERG, DOB 15-Mar-1926, MRN [id number]  PCP:  [name], MD  Cardiologist:   Dorris Carnes, MD   Pt referred by Dr Wolfgang Phoenix for CP      History of Present Illness: [name] is a 83 y.o. male with a history of  Htn, AAA, Admitted on 7/13 with R sided CP    Pain was intermitt for several days  Radiated to R shoulder  Worse with movement  Did  Have diaphoresis  Some SOB   Pain was felt to be  musculoskeetal  Sent home   Since then the pt has had interimtt R sided pains  Occasional SOB    Lives with wife       Current Meds  Medication Sig  . acetaminophen (TYLENOL) 650 MG CR tablet Take 650-1,300 mg by mouth every 8 (eight) hours as needed for pain.  Marland Kitchen aspirin EC 81 MG tablet Take 81 mg by mouth daily.  Marland Kitchen docusate sodium (COLACE) 100 MG capsule Take 2 capsules (200 mg total) by mouth at bedtime. (Patient taking differently: Take 200 mg by mouth daily as needed. )  . guaiFENesin (MUCINEX) 600 MG 12 hr tablet Take 1 tablet (600 mg total) by mouth 2 (two) times daily as needed.  . mometasone (ELOCON) 0.1 % cream APPLY TO AFFECTED AREAS TWICE DAILY FOR 3 WEEKS.  . potassium chloride (K-DUR,KLOR-CON) 10 MEQ tablet TAKE ONE TABLET BY MOUTH ONCE DAILY.  Marland Kitchen PROAIR HFA 108 (90 Base) MCG/ACT inhaler INHALE 2 PUFFS EVERY 4 HOURS AS NEEDED.  . ranitidine (ZANTAC) 150 MG tablet TAKE (1) TABLET BY MOUTH TWICE DAILY. (Patient taking differently: TAKE (1) TABLET BY MOUTH AT NIGHT)  . torsemide (DEMADEX) 20 MG tablet TAKE 1/2 TABLET BY MOUTH EVERY MORNING.  Marland Kitchen triamcinolone cream (KENALOG) 0.1 % APPLY TO AFFECTED AREAS TWICE DAILY AS NEEDED.     Allergies:   Penicillins   Past Medical History:  Diagnosis Date  . Diverticulosis   . GERD (gastroesophageal reflux disease)   . Gout   . Heart murmur    Evaluation by a cardiologist years ago was reportedly negative  . HTN (hypertension)   . Osteoarthritis   . Reflux      Past Surgical History:  Procedure Laterality Date  . APPENDECTOMY  2007  . COLONOSCOPY  2003  . COLONOSCOPY N/A 12/28/2012   Procedure: COLONOSCOPY;  Surgeon: Rogene Houston, MD;  Location[facility];  Service: Endoscopy;  Laterality: N/A;  255-rescheduled to 10:30 Ann notified pt  . EYE SURGERY Right    cataract removal  . INGUINAL HERNIA REPAIR     Right  . JOINT REPLACEMENT     right total knee RIGHT total knee arthroplasty  . TOTAL KNEE ARTHROPLASTY  09/2009   Right     Social History:  The patient  reports that he has been smoking Pipe.  He started smoking about 76 years ago. He has never used smokeless tobacco. He reports that he does not drink alcohol or use drugs.   Family History:  The patient's family history includes Arthritis in his other; Cancer in his brother; Heart attack in his father; Heart disease in his other; Hypertension in his father and mother; Uterine cancer in his maternal aunt.    ROS:  Please see the history of present illness. All other systems are reviewed and  Negative to the above problem except as noted.  PHYSICAL EXAM: VS:  BP (!) 162/96 (BP Location: Right Arm, Cuff Size: Large)   Pulse 75   Ht 5\' 8"  (1.727 m)   Wt 184 lb (83.5 kg)   SpO2 96%   BMI 27.98 kg/m   GEN: Well nourished, well developed, in no acute distress  HEENT: normal  Neck: no JVD, carotid bruits, or masses Cardiac: RRR; no murmurs, rubs, or gallops,no edema  Respiratory:  clear to auscultation bilaterally, normal work of breathing GI: soft, nontender, nondistended, + BS  No hepatomegaly  MS: no deformity Moving all extremities   Skin: warm and dry, no rash Neuro:  Strength and sensation are intact Psych: euthymic mood, full affect   EKG:  EKG is not ordered today.On 7/12  SR with PACs    Lipid Panel    Component Value Date/Time   CHOL 173 12/25/2015 1433   TRIG 125 12/25/2015 1433   HDL 61 12/25/2015 1433   CHOLHDL 2.8 12/25/2015 1433   CHOLHDL 2.9  01/11/2014 0928   VLDL 23 01/11/2014 0928   LDLCALC 87 12/25/2015 1433      Wt Readings from Last 3 Encounters:  10/02/16 184 lb (83.5 kg)  09/24/16 181 lb (82.1 kg)  05/18/16 187 lb 8 oz (85 kg)      ASSESSMENT AND PLAN:  1  Chest pain  Pt is a somewhat diffiulct historian  I do not get a history that suggests pure musculoskel origin  Also concerning is dyspnea/SOB Review of records pt has severe atherosclerosis of abdominal aorta Pt lives with wife at home   I would recomm Lexiscan myovue to eval  For ischemia  2  HL  Pt with signif vascluar dz  I would increase Crestor for tighter control  Lipids will need f/u in 2 months  3  HTN  BP is up today  WIll need to be followed  Further testing based on test results      Current medicines are reviewed at length with the patient today.  The patient does not have concerns regarding medicines.  Signed, Dorris Carnes, MD  10/02/2016 11:38 AM    [address], [place], [place]  [address] Phone: [phone]; Fax: [phone]

## 2016-10-02 NOTE — Patient Instructions (Signed)
Your physician recommends that you schedule a follow-up appointment in:  To be determined after [facility] physician has requested that you have a lexiscan myoview. For further information please visit [email or web address]. Please follow instruction sheet, as given.     START Crestor 20 mg at dinner   No lab work ordered today.     Thank you for choosing [facility] !

## 2016-10-05 ENCOUNTER — Telehealth: Payer: Self-pay | Admitting: Family Medicine

## 2016-10-05 NOTE — Telephone Encounter (Signed)
Patient has follow-up with [place] later this week. Please call the patient on Tuesday. Let him know we are where he was in the hospital. See how he is doing. Documented accordingly. If he needs to be seen sooner let me know. Otherwise instructed the patient to bring all of his medications with him when he calms for his office visit.(Documentation necessary for Medicare requirements)

## 2016-10-06 NOTE — Telephone Encounter (Signed)
Left message return call 10/06/16 

## 2016-10-07 ENCOUNTER — Ambulatory Visit (INDEPENDENT_AMBULATORY_CARE_PROVIDER_SITE_OTHER): Payer: Medicare HMO | Admitting: Family Medicine

## 2016-10-07 ENCOUNTER — Encounter: Payer: Self-pay | Admitting: Family Medicine

## 2016-10-07 ENCOUNTER — Telehealth: Payer: Self-pay | Admitting: *Deleted

## 2016-10-07 ENCOUNTER — Encounter (HOSPITAL_COMMUNITY)
Admission: RE | Admit: 2016-10-07 | Discharge: 2016-10-07 | Disposition: A | Discharge status: Home / Self Care | Payer: Medicare HMO | Source: Ambulatory Visit | Attending: Internal Medicine | Admitting: Internal Medicine

## 2016-10-07 ENCOUNTER — Encounter (HOSPITAL_BASED_OUTPATIENT_CLINIC_OR_DEPARTMENT_OTHER)
Admission: RE | Admit: 2016-10-07 | Discharge: 2016-10-07 | Disposition: A | Discharge status: Home / Self Care | Payer: Medicare HMO | Source: Ambulatory Visit | Attending: Internal Medicine | Admitting: Internal Medicine

## 2016-10-07 ENCOUNTER — Encounter (HOSPITAL_COMMUNITY): Payer: Self-pay

## 2016-10-07 VITALS — BP 122/80 | Ht 70.0 in | Wt 187.1 lb

## 2016-10-07 DIAGNOSIS — E7849 Other hyperlipidemia: Secondary | ICD-10-CM

## 2016-10-07 DIAGNOSIS — E784 Other hyperlipidemia: Secondary | ICD-10-CM | POA: Diagnosis not present

## 2016-10-07 DIAGNOSIS — H6121 Impacted cerumen, right ear: Secondary | ICD-10-CM

## 2016-10-07 DIAGNOSIS — I10 Essential (primary) hypertension: Secondary | ICD-10-CM

## 2016-10-07 DIAGNOSIS — R079 Chest pain, unspecified: Secondary | ICD-10-CM | POA: Diagnosis not present

## 2016-10-07 HISTORY — DX: Unspecified asthma, uncomplicated: J45.909

## 2016-10-07 LAB — NM MYOCAR MULTI W/SPECT W/WALL MOTION / EF
CHL CUP NUCLEAR SSS: 5
CSEPPHR: 77 {beats}/min
LHR: 0.35
LV sys vol: 50 mL
LVDIAVOL: 111 mL (ref 62–150)
Rest HR: 63 {beats}/min
SDS: 0
SRS: 5
TID: 0.93

## 2016-10-07 MED ORDER — REGADENOSON 0.4 MG/5ML IV SOLN
INTRAVENOUS | Status: AC
  Administered 2016-10-07: 0.4 mg via INTRAVENOUS
  Filled 2016-10-07: qty 5

## 2016-10-07 MED ORDER — TECHNETIUM TC 99M TETROFOSMIN IV KIT
10.0000 | PACK | Freq: Once | INTRAVENOUS | Status: AC | PRN
  Administered 2016-10-07: 10.5 via INTRAVENOUS

## 2016-10-07 MED ORDER — TECHNETIUM TC 99M TETROFOSMIN IV KIT
30.0000 | PACK | Freq: Once | INTRAVENOUS | Status: AC | PRN
  Administered 2016-10-07: 33 via INTRAVENOUS

## 2016-10-07 MED ORDER — SODIUM CHLORIDE 0.9% FLUSH
INTRAVENOUS | Status: AC
  Administered 2016-10-07: 10 mL via INTRAVENOUS
  Filled 2016-10-07: qty 10

## 2016-10-07 MED ORDER — SULFAMETHOXAZOLE-TRIMETHOPRIM 800-160 MG PO TABS: 1.0000 | ORAL_TABLET | Freq: Two times a day (BID) | ORAL | 0 refills | Status: DC

## 2016-10-07 NOTE — Progress Notes (Signed)
   Subjective:    Patient ID: [name], male    DOB: 04/13/1925, 83 y.o.   MRN: [id number]  HPI; Patient is here with his wife today to follow up on his hospitalization due to chest pain. Patient states he feels much better.  Patient had some chest pains went to the ER was put through a stress test this came back normal he states he's no longer having chest pains and needs feeling better. ER note was reviewed it is noted that the d-dimer was slightly elevated the patient currently denies any chest pain shortness of breath and does not have any swelling in his legs  Complains of right ear pain states he saw blood in it the other day. Complains of some soreness discomfort in the ear some difficulty hearing denies high fever chills  No other concerns.    Review of Systems Negative for chest pain shortness breath negative for swelling in the legs no for vomiting or diarrhea or fever    Objective:   Physical Exam Neck no masses right ear cerumen impaction with some soreness left eardrum normal lungs clear no crackles heart regular blood pressure good Stress test negative reviewed with the patient    Assessment & Plan:  Chest pain resolved if ongoing troubles follow-up  Slight elevation of d-dimer nonspecific current symptoms are negative will not do any other testing currently  Patient to follow-up in several months for recheck sooner if any problems  Cerumen impaction referral to ENT for further evaluation and removal  Hyperlipidemia lipid profile ordered

## 2016-10-07 NOTE — Telephone Encounter (Signed)
Pt seen today by Dr. Nicki Reaper

## 2016-10-07 NOTE — Addendum Note (Signed)
Addended by: Carmelina Noun on: 10/07/2016 02:50 PM   Modules accepted: Orders

## 2016-10-07 NOTE — Telephone Encounter (Signed)
Called patient with test results. No answer. Left message to call back.  

## 2016-10-07 NOTE — Telephone Encounter (Signed)
-----   Message from Fay Records, MD sent at 10/07/2016 12:30 PM EDT ----- Stress test is normal  Blood flow to heart normal  Normal pumping function

## 2016-10-08 DIAGNOSIS — E784 Other hyperlipidemia: Secondary | ICD-10-CM | POA: Diagnosis not present

## 2016-10-09 ENCOUNTER — Encounter: Payer: Self-pay | Admitting: Family Medicine

## 2016-10-09 LAB — LIPID PANEL
CHOLESTEROL TOTAL: 114 mg/dL (ref 100–199)
Chol/HDL Ratio: 1.8 ratio (ref 0.0–5.0)
HDL: 63 mg/dL (ref >39)
LDL CALC: 39 mg/dL (ref 0–99)
Triglycerides: 61 mg/dL (ref 0–149)
VLDL Cholesterol Cal: 12 mg/dL (ref 5–40)

## 2016-10-13 ENCOUNTER — Encounter: Payer: Self-pay | Admitting: Family Medicine

## 2016-12-01 DIAGNOSIS — Z6826 Body mass index (BMI) 26.0-26.9, adult: Secondary | ICD-10-CM | POA: Diagnosis not present

## 2016-12-01 DIAGNOSIS — E7800 Pure hypercholesterolemia, unspecified: Secondary | ICD-10-CM | POA: Diagnosis not present

## 2016-12-01 DIAGNOSIS — M170 Bilateral primary osteoarthritis of knee: Secondary | ICD-10-CM | POA: Diagnosis not present

## 2016-12-01 DIAGNOSIS — H6123 Impacted cerumen, bilateral: Secondary | ICD-10-CM | POA: Diagnosis not present

## 2016-12-01 DIAGNOSIS — I10 Essential (primary) hypertension: Secondary | ICD-10-CM | POA: Diagnosis not present

## 2016-12-01 DIAGNOSIS — K219 Gastro-esophageal reflux disease without esophagitis: Secondary | ICD-10-CM | POA: Diagnosis not present

## 2016-12-01 DIAGNOSIS — R609 Edema, unspecified: Secondary | ICD-10-CM | POA: Diagnosis not present

## 2016-12-01 DIAGNOSIS — Z0000 Encounter for general adult medical examination without abnormal findings: Secondary | ICD-10-CM | POA: Diagnosis not present

## 2016-12-01 DIAGNOSIS — K5900 Constipation, unspecified: Secondary | ICD-10-CM | POA: Diagnosis not present

## 2016-12-01 DIAGNOSIS — K08109 Complete loss of teeth, unspecified cause, unspecified class: Secondary | ICD-10-CM | POA: Diagnosis not present

## 2016-12-22 ENCOUNTER — Ambulatory Visit (INDEPENDENT_AMBULATORY_CARE_PROVIDER_SITE_OTHER): Payer: Medicare HMO

## 2016-12-22 DIAGNOSIS — Z23 Encounter for immunization: Secondary | ICD-10-CM | POA: Diagnosis not present

## 2017-01-01 ENCOUNTER — Encounter: Payer: Self-pay | Admitting: Family Medicine

## 2017-01-01 ENCOUNTER — Ambulatory Visit (INDEPENDENT_AMBULATORY_CARE_PROVIDER_SITE_OTHER): Payer: Medicare HMO | Admitting: Family Medicine

## 2017-01-01 VITALS — BP 128/82 | Temp 98.2°F | Ht 70.0 in | Wt 180.4 lb

## 2017-01-01 DIAGNOSIS — J189 Pneumonia, unspecified organism: Secondary | ICD-10-CM

## 2017-01-01 DIAGNOSIS — J181 Lobar pneumonia, unspecified organism: Secondary | ICD-10-CM | POA: Diagnosis not present

## 2017-01-01 MED ORDER — ALBUTEROL SULFATE HFA 108 (90 BASE) MCG/ACT IN AERS: 2.0000 | INHALATION_SPRAY | RESPIRATORY_TRACT | 5 refills | Status: DC | PRN

## 2017-01-01 MED ORDER — DOXYCYCLINE HYCLATE 100 MG PO TABS: 100.0000 mg | ORAL_TABLET | Freq: Two times a day (BID) | ORAL | 0 refills | Status: DC

## 2017-01-01 MED ORDER — CEFTRIAXONE SODIUM 1 G IJ SOLR
1.0000 g | Freq: Once | INTRAMUSCULAR | Status: AC
  Administered 2017-01-01: 1 g via INTRAMUSCULAR

## 2017-01-01 NOTE — Progress Notes (Addendum)
   Subjective:    Patient ID: [name], male    DOB: 1926/02/26, 83 y.o.   MRN: [id number]  Cough  This is a new problem. The current episode started in the past 7 days. Associated symptoms include nasal congestion and wheezing. Associated symptoms comments: Chest sore in ribs. Treatments tried: tylenol and cough meds.   Patient has not noted chills or fever. Patient does smoke. Has had progressive cough. Now pain on left side. Sharp in ature. Worse with deep breath. Some slight dyspnea with exertion. No steadiness to the chest pa  Review of Systems  Respiratory: Positive for cough and wheezing.        Objective:   Physical Exam  Alert active good hydration HEENT normal pharynx normal neck supple lungs distinct basilar crackles and inspiration. No tachypnea. No wheezes currently heart regular in rhythm.      Assessment & Plan:  Impression probable left lower lobe pneumonia. Discussed at length. Potential interventions discussed. Do not feel need x-ray blood work at this time. However should start with a good dose of Rocephin and in an appropriate oral antibiotics. Warning signs discussed carefully/originally recommended follow-up mid week but already has appointment with Dr. Nicki Reaper on Thursday

## 2017-01-07 ENCOUNTER — Ambulatory Visit (INDEPENDENT_AMBULATORY_CARE_PROVIDER_SITE_OTHER): Payer: Medicare HMO | Admitting: Family Medicine

## 2017-01-07 ENCOUNTER — Encounter: Payer: Self-pay | Admitting: Family Medicine

## 2017-01-07 VITALS — BP 152/98 | Ht 70.0 in | Wt 178.2 lb

## 2017-01-07 DIAGNOSIS — J189 Pneumonia, unspecified organism: Secondary | ICD-10-CM

## 2017-01-07 DIAGNOSIS — J181 Lobar pneumonia, unspecified organism: Secondary | ICD-10-CM | POA: Diagnosis not present

## 2017-01-07 DIAGNOSIS — I10 Essential (primary) hypertension: Secondary | ICD-10-CM

## 2017-01-07 MED ORDER — TORSEMIDE 20 MG PO TABS: 10.0000 mg | ORAL_TABLET | Freq: Every morning | ORAL | 5 refills | Status: DC

## 2017-01-07 MED ORDER — POTASSIUM CHLORIDE CRYS ER 10 MEQ PO TBCR: 10.0000 meq | EXTENDED_RELEASE_TABLET | Freq: Every day | ORAL | 5 refills | Status: DC

## 2017-01-07 NOTE — Progress Notes (Signed)
   Subjective:    Patient ID: [name], male    DOB: [birth year], 83 y.o.   MRN: [id number]  Hypertension  This is a chronic problem. The current episode started more than 1 year ago.    Patient would like to discuss pneumonia. Patient was diagnosed last week wants to ensure he is doing better.  Patient relates that he is doing well with taking his antibiotics energy level is picking up he denies any shortness of breath  Is best he knows his blood pressure is doing well he tries to eat relatively healthy he is not had any falls he does take his cholesterol medicine  Review of Systems Denies any chest tightness pressure pain shortness breath nausea vomiting diarrhea fever chills or sweats    Objective:   Physical Exam  Lungs are clear no crackles respiratory rate is normal heart is regular no murmurs blood pressure 118/88 sitting 110/88 standing      Assessment & Plan:  Recent pneumonia is now cleared the lungs sound great follow-up if any problems or setbacks no x-rays or lab work indicated  History hyperlipidemia recent labs look good follow-up labs in the spring  Blood pressure acceptable does drop some when he stands so therefore I would not recommend adding any additional medicines

## 2017-01-07 NOTE — Patient Instructions (Signed)
Your lungs sound great if any problems please call

## 2017-01-09 ENCOUNTER — Other Ambulatory Visit: Payer: Self-pay | Admitting: Family Medicine

## 2017-03-02 ENCOUNTER — Encounter: Payer: Self-pay | Admitting: Family Medicine

## 2017-03-02 ENCOUNTER — Ambulatory Visit (INDEPENDENT_AMBULATORY_CARE_PROVIDER_SITE_OTHER): Payer: Medicare HMO | Admitting: Family Medicine

## 2017-03-02 VITALS — BP 118/88 | HR 78 | Ht 70.0 in | Wt 173.0 lb

## 2017-03-02 DIAGNOSIS — D509 Iron deficiency anemia, unspecified: Secondary | ICD-10-CM | POA: Diagnosis not present

## 2017-03-02 DIAGNOSIS — I10 Essential (primary) hypertension: Secondary | ICD-10-CM | POA: Diagnosis not present

## 2017-03-02 DIAGNOSIS — H6121 Impacted cerumen, right ear: Secondary | ICD-10-CM

## 2017-03-02 DIAGNOSIS — R634 Abnormal weight loss: Secondary | ICD-10-CM

## 2017-03-02 NOTE — Progress Notes (Addendum)
   Subjective:    Patient ID: [name], male    DOB: [birth year], 83 y.o.   MRN: [id number]  HPI  Patient is here today with complaints of lightheadedness for a week now.He has not passed out,but he says he wobbles a little.He states his Torsemide is not lasting him a whole month. Orthostatics done. Patient at times feels lightheaded other times he feels run down he also states his weights gone down but his appetite is fair denies rectal bleeding denies hematuria denies chest pressure tightness pain denies abdominal pain patient had CAT scan last March showed some small pulmonary nodules also showed a pre-aneurysm for which we are repeating the scan again in 3 years time patient relates his energy level is fair denies being depressed PMH benign Hurley benign Review of Systems  Constitutional: Positive for fatigue and unexpected weight change. Negative for activity change and fever.  Respiratory: Negative for cough and shortness of breath.   Cardiovascular: Negative for chest pain and leg swelling.  Neurological: Negative for headaches.       Objective:   Physical Exam  Constitutional: He appears well-nourished. No distress.  HENT:  Head: Normocephalic and atraumatic.  Cardiovascular: Normal rate, regular rhythm and normal heart sounds.  No murmur heard. Pulmonary/Chest: Effort normal and breath sounds normal. No respiratory distress.  Abdominal: Soft. He exhibits no distension. There is no tenderness. There is no rebound.  Musculoskeletal: He exhibits no edema or tenderness.  Lymphadenopathy:    He has no cervical adenopathy.  Neurological: He is alert.  Skin: Skin is warm and dry. No erythema.  Psychiatric: His behavior is normal.  Vitals reviewed.  Mild cerumen impaction in the right ear referral to ENT   25 minutes was spent with the patient. Greater than half the time was spent in discussion and answering questions and counseling regarding the issues that the patient  came in for today.     Assessment & Plan:  I am concerned about this patient's weight loss I encouraged the patient to do better with eating we will follow closely recheck him again in 4 weeks  Nonspecific complaints of feeling bad the patient overall looks good except for the mild weight loss we will run some tests may need additional testing  Blood pressure overall doing well no masses felt on exam

## 2017-03-03 ENCOUNTER — Other Ambulatory Visit: Payer: Self-pay | Admitting: Family Medicine

## 2017-03-03 LAB — CBC WITH DIFFERENTIAL/PLATELET
BASOS: 1 %
Basophils Absolute: 0 10*3/uL (ref 0.0–0.2)
EOS (ABSOLUTE): 0.1 10*3/uL (ref 0.0–0.4)
Eos: 2 %
HEMATOCRIT: 40.4 % (ref 37.5–51.0)
HEMOGLOBIN: 13.6 g/dL (ref 13.0–17.7)
IMMATURE GRANS (ABS): 0 10*3/uL (ref 0.0–0.1)
Immature Granulocytes: 0 %
LYMPHS: 17 %
Lymphocytes Absolute: 0.8 10*3/uL (ref 0.7–3.1)
MCH: 32.6 pg (ref 26.6–33.0)
MCHC: 33.7 g/dL (ref 31.5–35.7)
MCV: 97 fL (ref 79–97)
MONOCYTES: 9 %
Monocytes Absolute: 0.4 10*3/uL (ref 0.1–0.9)
NEUTROS ABS: 3.4 10*3/uL (ref 1.4–7.0)
Neutrophils: 71 %
Platelets: 152 10*3/uL (ref 150–379)
RBC: 4.17 x10E6/uL (ref 4.14–5.80)
RDW: 12.5 % (ref 12.3–15.4)
WBC: 4.7 10*3/uL (ref 3.4–10.8)

## 2017-03-03 LAB — IRON AND TIBC
IRON SATURATION: 40 % (ref 15–55)
Iron: 112 ug/dL (ref 38–169)
Total Iron Binding Capacity: 277 ug/dL (ref 250–450)
UIBC: 165 ug/dL (ref 111–343)

## 2017-03-03 LAB — HEPATIC FUNCTION PANEL
ALK PHOS: 56 IU/L (ref 39–117)
ALT: 9 IU/L (ref 0–44)
AST: 14 IU/L (ref 0–40)
Albumin: 4.8 g/dL — ABNORMAL HIGH (ref 3.5–4.7)
BILIRUBIN, DIRECT: 0.44 mg/dL — AB (ref 0.00–0.40)
Bilirubin Total: 2.5 mg/dL — ABNORMAL HIGH (ref 0.0–1.2)
Total Protein: 6.9 g/dL (ref 6.0–8.5)

## 2017-03-03 LAB — BASIC METABOLIC PANEL
BUN / CREAT RATIO: 11 (ref 10–24)
BUN: 12 mg/dL (ref 8–27)
CALCIUM: 9.5 mg/dL (ref 8.6–10.2)
CO2: 30 mmol/L — ABNORMAL HIGH (ref 20–29)
Chloride: 101 mmol/L (ref 96–106)
Creatinine, Ser: 1.08 mg/dL (ref 0.76–1.27)
GFR, EST AFRICAN AMERICAN: 70 mL/min/{1.73_m2} (ref >59)
GFR, EST NON AFRICAN AMERICAN: 61 mL/min/{1.73_m2} (ref >59)
Glucose: 104 mg/dL — ABNORMAL HIGH (ref 65–99)
Potassium: 3.9 mmol/L (ref 3.5–5.2)
Sodium: 144 mmol/L (ref 134–144)

## 2017-03-03 LAB — FERRITIN: FERRITIN: 228 ng/mL (ref 30–400)

## 2017-03-04 ENCOUNTER — Encounter: Payer: Self-pay | Admitting: Family Medicine

## 2017-03-30 ENCOUNTER — Ambulatory Visit (INDEPENDENT_AMBULATORY_CARE_PROVIDER_SITE_OTHER): Payer: Medicare HMO | Admitting: Family Medicine

## 2017-03-30 ENCOUNTER — Encounter: Payer: Self-pay | Admitting: Family Medicine

## 2017-03-30 VITALS — BP 138/96 | Ht 70.0 in | Wt 173.0 lb

## 2017-03-30 DIAGNOSIS — I10 Essential (primary) hypertension: Secondary | ICD-10-CM

## 2017-03-30 DIAGNOSIS — R17 Unspecified jaundice: Secondary | ICD-10-CM | POA: Diagnosis not present

## 2017-03-30 NOTE — Progress Notes (Signed)
   Subjective:    Patient ID: [name], male    DOB: 10/07/1925, 84 y.o.   MRN: [id number]  HPIFollow up on weight loss. Pt states he eats pretty good. Eats breaksfast and then nibbles the rest of the day.  He has had fair appetite but his weight has remained stable.  Left shoulder pain. Started last week. Taking tylenol.  He relates his left shoulder hurts with certain movements denies any injury been present for a few days The patient recently had pneumonia earlier in the fall it was noted that his weight was down a few pounds then he had a follow-up in December and his weight was down to 173 he presents today for follow-up of his weight his weight is still the same he denies abdominal pain states his appetite is fair  Recent lab work in December showed slight elevation of bilirubin but liver enzymes were normal  CT scan from 2018 was reviewed as well  Review of Systems Denies chest pain shortness of breath abdominal pain rectal bleeding hematuria denies headache fever chills sweats denies depression    Objective:   Physical Exam  Lungs are clear heart is regular abdomen is soft no guarding rebound or tenderness extremities no edema no jaundice noted      Assessment & Plan:  Elevated bilirubin-repeat bilirubin if this is still going up he needs to have liver imaging including ultrasound and probable CT scan  Weight is stable encourage dietary intake He is not having any abdominal pain currently He does relate at times unusual sensation in his mid abdomen but he states this is come and gone over the years for 20 years  Blood pressure stable

## 2017-03-31 LAB — BASIC METABOLIC PANEL
BUN / CREAT RATIO: 9 — AB (ref 10–24)
BUN: 11 mg/dL (ref 8–27)
CHLORIDE: 102 mmol/L (ref 96–106)
CO2: 26 mmol/L (ref 20–29)
Calcium: 9.6 mg/dL (ref 8.6–10.2)
Creatinine, Ser: 1.22 mg/dL (ref 0.76–1.27)
GFR calc Af Amer: 60 mL/min/{1.73_m2} (ref >59)
GFR calc non Af Amer: 52 mL/min/{1.73_m2} — ABNORMAL LOW (ref >59)
GLUCOSE: 99 mg/dL (ref 65–99)
Potassium: 3.7 mmol/L (ref 3.5–5.2)
Sodium: 144 mmol/L (ref 134–144)

## 2017-03-31 LAB — HEPATIC FUNCTION PANEL
ALBUMIN: 4.4 g/dL (ref 3.5–4.7)
ALT: 8 IU/L (ref 0–44)
AST: 17 IU/L (ref 0–40)
Alkaline Phosphatase: 56 IU/L (ref 39–117)
BILIRUBIN, DIRECT: 0.37 mg/dL (ref 0.00–0.40)
Bilirubin Total: 2.1 mg/dL — ABNORMAL HIGH (ref 0.0–1.2)
TOTAL PROTEIN: 6.9 g/dL (ref 6.0–8.5)

## 2017-04-02 NOTE — Addendum Note (Signed)
Addended by: Dairl Ponder on: 04/02/2017 11:46 AM   Modules accepted: Orders

## 2017-04-05 ENCOUNTER — Ambulatory Visit (INDEPENDENT_AMBULATORY_CARE_PROVIDER_SITE_OTHER): Payer: Medicare HMO | Admitting: Otolaryngology

## 2017-04-05 DIAGNOSIS — H6122 Impacted cerumen, left ear: Secondary | ICD-10-CM

## 2017-04-05 DIAGNOSIS — H9012 Conductive hearing loss, unilateral, left ear, with unrestricted hearing on the contralateral side: Secondary | ICD-10-CM | POA: Diagnosis not present

## 2017-04-07 ENCOUNTER — Ambulatory Visit (HOSPITAL_COMMUNITY)
Admission: RE | Admit: 2017-04-07 | Discharge: 2017-04-07 | Disposition: A | Discharge status: Home / Self Care | Payer: Medicare HMO | Source: Ambulatory Visit | Attending: Family Medicine | Admitting: Family Medicine

## 2017-04-07 DIAGNOSIS — R7989 Other specified abnormal findings of blood chemistry: Secondary | ICD-10-CM | POA: Diagnosis not present

## 2017-04-07 DIAGNOSIS — R17 Unspecified jaundice: Secondary | ICD-10-CM | POA: Diagnosis not present

## 2017-04-07 DIAGNOSIS — I77811 Abdominal aortic ectasia: Secondary | ICD-10-CM | POA: Diagnosis not present

## 2017-04-27 ENCOUNTER — Ambulatory Visit (INDEPENDENT_AMBULATORY_CARE_PROVIDER_SITE_OTHER): Payer: Medicare HMO | Admitting: Family Medicine

## 2017-04-27 ENCOUNTER — Encounter: Payer: Self-pay | Admitting: Family Medicine

## 2017-04-27 VITALS — BP 122/80 | Temp 97.7°F | Ht 70.0 in | Wt 173.0 lb

## 2017-04-27 DIAGNOSIS — R17 Unspecified jaundice: Secondary | ICD-10-CM | POA: Diagnosis not present

## 2017-04-27 DIAGNOSIS — I10 Essential (primary) hypertension: Secondary | ICD-10-CM | POA: Diagnosis not present

## 2017-04-27 DIAGNOSIS — M25512 Pain in left shoulder: Secondary | ICD-10-CM

## 2017-04-27 NOTE — Patient Instructions (Signed)
Bring all your medicine bottles on your next visit

## 2017-04-27 NOTE — Progress Notes (Signed)
   Subjective:    Patient ID: [name], male    DOB: 01-27-1926, 84 y.o.   MRN: [id number]  HPI Patient is here today to follow up on elevated bilil also weight loss. Was seen 03/30/2017 and back today for reevaluation. Still having some abd pain/cramping. Intermittent abdominal pain never severe no nausea vomiting or diarrhea appetite is good activity level is good pain only occurs every so often he states that the same pain he has been having for 20 years recent lab work and ultrasound shows slight elevation of bilirubin but ultrasound looks good patient had a CAT scan about a year ago Has some shoulder pain.  Intermittent shoulder pain discomfort hurts with certain movements some stiffness denies any injury the patient should not be on any type of anti-inflammatory on a regular basis because of risk of GI bleed   Review of Systems  Constitutional: Negative for activity change, appetite change and fatigue.  HENT: Negative for congestion and rhinorrhea.   Respiratory: Negative for cough, chest tightness and shortness of breath.   Cardiovascular: Negative for chest pain and leg swelling.  Gastrointestinal: Positive for abdominal pain. Negative for diarrhea and nausea.  Endocrine: Negative for polydipsia and polyphagia.  Genitourinary: Negative for dysuria and hematuria.  Neurological: Negative for weakness and headaches.  Psychiatric/Behavioral: Negative for confusion and dysphoric mood.       Objective:   Physical Exam  Constitutional: He appears well-nourished. No distress.  HENT:  Head: Normocephalic and atraumatic.  Eyes: Right eye exhibits no discharge. Left eye exhibits no discharge.  Neck: No tracheal deviation present.  Cardiovascular: Normal rate, regular rhythm and normal heart sounds.  No murmur heard. Pulmonary/Chest: Effort normal and breath sounds normal. No respiratory distress. He has no wheezes.  Musculoskeletal: He exhibits no edema.  Lymphadenopathy:    He has no cervical adenopathy.  Neurological: He is alert.  Skin: Skin is warm. No rash noted.  Psychiatric: His behavior is normal.  Vitals reviewed.    Bilirubin discussed with the patient came down on last visit no need to repeat this currently     Assessment & Plan:  Intermittent abdominal pain-not severe.  Patient states that the same level of pain he has had for the past 20 years.  Comes and goes bowel movements most of the time normal denies any blood in the bowel movement therefore patient does not need further testing currently  Referral to Dr. Aline Brochure for his shoulder may need injection and physical therapy  Patient has significant confusion regarding his medications which makes me question how well is he taking his medicines therefore I recommend that the patient follow-up with all of his medicines within 1 month to review the medications

## 2017-04-28 NOTE — Addendum Note (Signed)
Addended by: Dairl Ponder on: 04/28/2017 08:26 AM   Modules accepted: Orders

## 2017-05-06 ENCOUNTER — Other Ambulatory Visit: Payer: Self-pay | Admitting: Family Medicine

## 2017-05-07 ENCOUNTER — Encounter: Payer: Self-pay | Admitting: Family Medicine

## 2017-05-27 ENCOUNTER — Ambulatory Visit: Payer: Medicare HMO | Admitting: Family Medicine

## 2017-06-02 ENCOUNTER — Ambulatory Visit: Payer: Medicare HMO | Admitting: Orthopedic Surgery

## 2017-06-08 ENCOUNTER — Other Ambulatory Visit: Payer: Self-pay | Admitting: Family Medicine

## 2017-06-16 ENCOUNTER — Other Ambulatory Visit: Payer: Self-pay | Admitting: Family Medicine

## 2017-07-01 ENCOUNTER — Ambulatory Visit: Payer: Medicare HMO | Admitting: Family Medicine

## 2017-07-08 ENCOUNTER — Other Ambulatory Visit: Payer: Self-pay | Admitting: Family Medicine

## 2017-07-14 ENCOUNTER — Ambulatory Visit (INDEPENDENT_AMBULATORY_CARE_PROVIDER_SITE_OTHER): Payer: Medicare HMO | Admitting: Family Medicine

## 2017-07-14 ENCOUNTER — Encounter: Payer: Self-pay | Admitting: Family Medicine

## 2017-07-14 VITALS — BP 132/88 | Ht 70.0 in | Wt 174.0 lb

## 2017-07-14 DIAGNOSIS — I951 Orthostatic hypotension: Secondary | ICD-10-CM | POA: Diagnosis not present

## 2017-07-14 DIAGNOSIS — R0789 Other chest pain: Secondary | ICD-10-CM

## 2017-07-14 DIAGNOSIS — R600 Localized edema: Secondary | ICD-10-CM

## 2017-07-14 NOTE — Progress Notes (Signed)
   Subjective:    Patient ID: [name], male    DOB: Jul 01, 1925, 84 y.o.   MRN: [id number]  HPI Patient is here today for a one month follow up.Per dtr his balance is off. He has been stumbling around.  Patient states that he has had intermittent chest pains sometimes sharp sometimes not happen only a few seconds at a time comes and goes happens intermittently but not often  Patient denies shortness of breath wheezing difficulty breathing PND  Patient relates dizziness when he stands up and also sometimes weakness and stumbles some he was worried about the possibility of a stroke but denies any unilateral numbness weakness he does have pedal edema for which he takes medication  Review of Systems  Constitutional: Negative for activity change, appetite change and fatigue.  HENT: Negative for congestion and rhinorrhea.   Respiratory: Negative for cough, chest tightness and shortness of breath.   Cardiovascular: Positive for chest pain and leg swelling.       Never happens with activity relates intermittent sharp pains comes and goes does not happen frequently  Patient also relates intermittent swelling in the legs uses diuretic as prescribed  Gastrointestinal: Negative for abdominal pain, diarrhea and nausea.  Endocrine: Negative for polydipsia and polyphagia.  Genitourinary: Negative for dysuria and hematuria.  Neurological: Negative for weakness and headaches.  Psychiatric/Behavioral: Negative for confusion and dysphoric mood.       Objective:   Physical Exam  Constitutional: He appears well-developed and well-nourished. No distress.  HENT:  Head: Normocephalic and atraumatic.  Eyes: Right eye exhibits no discharge. Left eye exhibits no discharge.  Neck: No thyromegaly present.  Cardiovascular: Normal rate, regular rhythm and normal heart sounds.  No murmur heard. Pulmonary/Chest: Effort normal and breath sounds normal. No respiratory distress.  Abdominal: Soft. He  exhibits no distension. There is no tenderness.  Musculoskeletal: He exhibits no edema.  Lymphadenopathy:    He has no cervical adenopathy.  Neurological: He is alert.  Skin: Skin is warm and dry.  Psychiatric: His behavior is normal.  Vitals reviewed. Neurologically the patient has equal strength bilateral is able to walk without difficulty  25 minutes was spent with the patient.  This statement verifies that 25 minutes was indeed spent with the patient. Greater than half the time was spent in discussion, counseling and answering questions  regarding the issues that the patient came in for today as reflected in the diagnosis (s) please refer to documentation for further details.  This EKG was compared to the most recent previous EKG available in the electronic records.  (Please see previous EKGs most recent EKG under the EKG heading.)       Assessment & Plan:  1. Other chest pain Chest discomfort does not sound cardiac EKG looks normal compared to previous EKGs I do not feel the patient needs any cardiac work-up Could be musculoskeletal If he has progressive troubles or worse he is to notify us  2. Orthostatic hypotension Patient does have mild orthostasis I believe that this is related to his diuretic use I recommend he backed this off to just 3 times per week and he needs a follow-up here within 2 months to recheck this if he has progressive symptoms or if worse he needs to notify us  3. Pedal edema Patient has minimal pedal edema currently I do not recommend any particular measures currently other than what is stated above

## 2017-07-19 ENCOUNTER — Encounter: Payer: Self-pay | Admitting: Family Medicine

## 2017-08-11 ENCOUNTER — Ambulatory Visit: Payer: Medicare HMO | Admitting: Family Medicine

## 2017-08-13 ENCOUNTER — Other Ambulatory Visit: Payer: Self-pay | Admitting: Family Medicine

## 2017-08-26 ENCOUNTER — Ambulatory Visit (INDEPENDENT_AMBULATORY_CARE_PROVIDER_SITE_OTHER): Payer: Medicare HMO | Admitting: Family Medicine

## 2017-08-26 ENCOUNTER — Telehealth: Payer: Self-pay | Admitting: Family Medicine

## 2017-08-26 ENCOUNTER — Encounter: Payer: Self-pay | Admitting: Family Medicine

## 2017-08-26 VITALS — BP 136/92 | Temp 97.5°F | Ht 70.0 in | Wt 175.6 lb

## 2017-08-26 DIAGNOSIS — W57XXXA Bitten or stung by nonvenomous insect and other nonvenomous arthropods, initial encounter: Secondary | ICD-10-CM

## 2017-08-26 DIAGNOSIS — T148XXA Other injury of unspecified body region, initial encounter: Secondary | ICD-10-CM

## 2017-08-26 DIAGNOSIS — W01198A Fall on same level from slipping, tripping and stumbling with subsequent striking against other object, initial encounter: Secondary | ICD-10-CM

## 2017-08-26 DIAGNOSIS — S8012XA Contusion of left lower leg, initial encounter: Secondary | ICD-10-CM

## 2017-08-26 DIAGNOSIS — M79662 Pain in left lower leg: Secondary | ICD-10-CM

## 2017-08-26 NOTE — Telephone Encounter (Signed)
Daughter Ava called saying [name] got a tick bite a couple days ago and it caused a knot and is sore.  She wasn't sure if they needed to be seen or if this was just a normal reaction to a tick bite, or if something could be called in. She wasn't with him but said to call him at [phone] for more details.   [name]

## 2017-08-26 NOTE — Progress Notes (Signed)
   Subjective:    Patient ID: [name], male    DOB: 05-02-1925, 84 y.o.   MRN: [id number]  HPI Pt here today for knot on left lower leg. Had tick bite on Monday and wife pulled tick off. Has been using alcohol on area. Pt states he hit his leg on a bed frame this morning and that is when the knot came up.  Patient states he tripped and fell bruised his leg relates pain discomfort he wondered if it is related to a tick bite that was there earlier in the week denies any redness denies any cellulitis no fever chills myalgia. Review of Systems    See above please Objective:   Physical Exam  Vital signs were reviewed.  GEN-no acute distress not toxic HEENT- external ears appear normal the eyes appear normal without discharge head is atraumatic Neck-no lymphadenopathy or masses are felt no tracheal deviation Chest- CTA respiratory rate is normal no crackles no rhonchi Cardiovascular- heart is regular rate is normal no murmurs detected Extremities skin warm dry no significant edema Abdomen soft no guarding rebound or tenderness Neuro-alert and oriented, no unilateral numbness or weakness detected Has bruising on the left lower leg no sign of DVT      Assessment & Plan:  Leg contusion-recommend cool compresses frequently Follow-up if ongoing troubles No sign of infection currently No sign of DVT Warning signs discussed  History of tick bite with no apparent infection warning signs about this discussed  Keep all regular follow-up visits if high fever severe body pains or other problems follow-up ASAP

## 2017-08-26 NOTE — Patient Instructions (Addendum)
The area on your leg is a deep bruise  Use cool compresses for 20 minutes at a time 4 times a day for the next few days  Please call us if worsening issues  Please read the info on tick bites to know what to watch for       Tick Bite Information, Adult Ticks are insects that can bite. Most ticks live in shrubs and grassy areas. They climb onto people and animals that go by. Then they bite. Some ticks carry germs that can make you sick. How can I prevent tick bites?  Use an insect repellent that has 20% or higher of the ingredients DEET, picaridin, or IR3535. Put this insect repellent on: ? Bare skin. ? The tops of your boots. ? Your pant legs. ? The ends of your sleeves.  If you use an insect repellent that has the ingredient permethrin, make sure to follow the instructions on the bottle. Treat the following: ? Clothing. ? Supplies. ? Boots. ? Tents.  Wear long sleeves, long pants, and light colors.  Tuck your pant legs into your socks.  Stay in the middle of the trail.  Try not to walk through long grass.  Before going inside your house, check your clothes, hair, and skin for ticks. Make sure to check your head, neck, armpits, waist, groin, and joint areas.  Check for ticks every day.  When you come indoors: ? Wash your clothes right away. ? Shower right away. ? Dry your clothes in a dryer on high heat for 60 minutes or more. What is the right way to remove a tick? Remove a tick from your skin as soon as possible.  To remove a tick that is crawling on your skin: ? Go outdoors and brush the tick off. ? Use tape or a lint roller.  To remove a tick that is biting: ? Wash your hands. ? If you have latex gloves, put them on. ? Use tweezers, curved forceps, or a tick-removal tool to grasp the tick. Grasp the tick as close to your skin and as close to the tick's head as possible. ? Gently pull up until the tick lets go.  Try to keep the tick's head attached to its  body.  Do not twist or jerk the tick.  Do not squeeze or crush the tick.  Do not try to remove a tick with heat, alcohol, petroleum jelly, or fingernail polish. How should I get rid of a tick? Here are some ways to get rid of a tick that is alive:  Place the tick in rubbing alcohol.  Place the tick in a bag or container you can close tightly.  Wrap the tick tightly in tape.  Flush the tick down the toilet.  Contact a doctor if:  You have symptoms of a disease, such as: ? Pain in a muscle, joint, or bone. ? Trouble walking or moving your legs. ? Numbness in your legs. ? Inability to move (paralysis). ? A red rash that makes a circle (bull's-eye rash). ? Redness and swelling where the tick bit you. ? A fever. ? Throwing up (vomiting) over and over. ? Diarrhea. ? Weight loss. ? Tender and swollen lymph glands. ? Shortness of breath. ? Cough. ? Belly pain (abdominal pain). ? Headache. ? Being more tired than normal. ? A change in how alert (conscious) you are. ? Confusion. Get help right away if:  You cannot remove a tick.  A part of a tick breaks  off and gets stuck in your skin.  You are feeling worse. Summary  Ticks may carry germs that can make you sick.  To prevent tick bites, wear long sleeves, long pants, and light colors. Use insect repellent. Follow the instructions on the bottle.  If the tick is biting, do not try to remove it with heat, alcohol, petroleum jelly, or fingernail polish.  Use tweezers, curved forceps, or a tick-removal tool to grasp the tick. Gently pull up until the tick lets go. Do not twist or jerk the tick. Do not squeeze or crush the tick.  If you have symptoms, contact a doctor. This information is not intended to replace advice given to you by your health care provider. Make sure you discuss any questions you have with your health care provider. Document Released: 05/27/2009 Document Revised: 06/12/2016 Document Reviewed:  06/12/2016 Elsevier Interactive Patient Education  2018 [employer].

## 2017-08-26 NOTE — Telephone Encounter (Signed)
Tried to call but voicemail was full to get more info

## 2017-08-27 NOTE — Telephone Encounter (Signed)
Pt was seen yesterday for tick bite

## 2017-09-13 ENCOUNTER — Ambulatory Visit (INDEPENDENT_AMBULATORY_CARE_PROVIDER_SITE_OTHER): Payer: Medicare HMO | Admitting: Family Medicine

## 2017-09-13 ENCOUNTER — Encounter: Payer: Self-pay | Admitting: Family Medicine

## 2017-09-13 VITALS — BP 118/70 | Ht 70.0 in | Wt 173.8 lb

## 2017-09-13 DIAGNOSIS — I10 Essential (primary) hypertension: Secondary | ICD-10-CM

## 2017-09-13 DIAGNOSIS — R358 Other polyuria: Secondary | ICD-10-CM

## 2017-09-13 DIAGNOSIS — R3589 Other polyuria: Secondary | ICD-10-CM

## 2017-09-13 LAB — POCT URINALYSIS DIPSTICK
PH UA: 6 (ref 5.0–8.0)
Spec Grav, UA: 1.02 (ref 1.010–1.025)

## 2017-09-13 NOTE — Progress Notes (Signed)
Subjective:    Patient ID: [name], male    DOB: [birth year], 84 y.o.   MRN: [id number]  Hypertension  This is a chronic problem. The current episode started more than 1 year ago. Pertinent negatives include no chest pain, headaches or shortness of breath. Risk factors for coronary artery disease include male gender. Treatments tried: torsemide. There are no compliance problems.    Recheck tick bite-where he states he was bit on the left lower leg still with a little bit of swelling there but no other particular troubles.  Denies any other injury.  Relates he takes all of his medicines as directed does not feel he is having any flareup of his hypertension  Patient here for follow-up regarding cholesterol.  The patient does have hyperlipidemia.  Patient does try to maintain a reasonable diet.  Patient does take the medication on a regular basis.  Denies missing a dose.  The patient denies any obvious side effects.  Prior blood work results reviewed with the patient.  The patient is aware of his cholesterol goals and the need to keep it under good control to lessen the risk of disease.  Patient for blood pressure check up.  The patient does have hypertension.  The patient is on medication.  Patient relates compliance with meds. Todays BP reviewed with the patient. Patient denies issues with medication. Patient relates reasonable diet. Patient tries to minimize salt. Patient aware of BP goals.  He states one time his urine was reddish color he does not think it was bloody does not know what it was happen once but has not happened recent Review of Systems  Constitutional: Negative for activity change, appetite change and fatigue.  HENT: Negative for congestion and rhinorrhea.   Respiratory: Negative for cough, chest tightness and shortness of breath.   Cardiovascular: Negative for chest pain and leg swelling.  Gastrointestinal: Negative for abdominal pain, diarrhea and nausea.    Endocrine: Negative for polydipsia and polyphagia.  Genitourinary: Negative for dysuria and hematuria.  Neurological: Negative for weakness and headaches.  Psychiatric/Behavioral: Negative for confusion and dysphoric mood.       Objective:   Physical Exam  Constitutional: He appears well-nourished. No distress.  HENT:  Head: Normocephalic and atraumatic.  Eyes: Right eye exhibits no discharge. Left eye exhibits no discharge.  Neck: No tracheal deviation present.  Cardiovascular: Normal rate, regular rhythm and normal heart sounds.  No murmur heard. Pulmonary/Chest: Effort normal and breath sounds normal. No respiratory distress.  Musculoskeletal: He exhibits no edema.  Lymphadenopathy:    He has no cervical adenopathy.  Neurological: He is alert.  Psychiatric: His behavior is normal.  Vitals reviewed.         Assessment & Plan:  HTN- Patient was seen today as part of a visit regarding hypertension. The importance of healthy diet and regular physical activity was discussed. The importance of compliance with medications discussed.  Ideal goal is to keep blood pressure low elevated levels certainly below 341/96 when possible.  The patient was counseled that keeping blood pressure under control lessen his risk of complications.  The importance of regular follow-ups was discussed with the patient.  Low-salt diet such as DASH recommended.  Regular physical activity was recommended as well.  Patient was advised to keep regular follow-ups.  Area on left lower leg where a tick bite occurred does not show any sign of ongoing infection and slightly swollen-we will just watch this area no further antibiotics necessary  Patient  was concerned that he saw some red in his urine today's urine is normal there is no sign of blood he was told if he has further troubles with this come back give another sample

## 2017-09-23 ENCOUNTER — Other Ambulatory Visit: Payer: Self-pay | Admitting: Family Medicine

## 2017-10-26 DIAGNOSIS — I70203 Unspecified atherosclerosis of native arteries of extremities, bilateral legs: Secondary | ICD-10-CM | POA: Diagnosis not present

## 2017-10-26 DIAGNOSIS — L602 Onychogryphosis: Secondary | ICD-10-CM | POA: Diagnosis not present

## 2017-10-26 DIAGNOSIS — M2041 Other hammer toe(s) (acquired), right foot: Secondary | ICD-10-CM | POA: Diagnosis not present

## 2017-10-26 DIAGNOSIS — M79675 Pain in left toe(s): Secondary | ICD-10-CM | POA: Diagnosis not present

## 2017-10-26 DIAGNOSIS — M2042 Other hammer toe(s) (acquired), left foot: Secondary | ICD-10-CM | POA: Diagnosis not present

## 2017-10-26 DIAGNOSIS — M79674 Pain in right toe(s): Secondary | ICD-10-CM | POA: Diagnosis not present

## 2017-10-26 DIAGNOSIS — L84 Corns and callosities: Secondary | ICD-10-CM | POA: Diagnosis not present

## 2017-11-17 ENCOUNTER — Encounter: Payer: Self-pay | Admitting: Family Medicine

## 2017-11-17 ENCOUNTER — Ambulatory Visit (INDEPENDENT_AMBULATORY_CARE_PROVIDER_SITE_OTHER): Payer: Medicare HMO | Admitting: Family Medicine

## 2017-11-17 VITALS — BP 136/94 | Temp 97.7°F | Ht 70.0 in | Wt 171.2 lb

## 2017-11-17 DIAGNOSIS — Z79899 Other long term (current) drug therapy: Secondary | ICD-10-CM | POA: Diagnosis not present

## 2017-11-17 DIAGNOSIS — M1000 Idiopathic gout, unspecified site: Secondary | ICD-10-CM | POA: Diagnosis not present

## 2017-11-17 DIAGNOSIS — L03032 Cellulitis of left toe: Secondary | ICD-10-CM

## 2017-11-17 DIAGNOSIS — E7849 Other hyperlipidemia: Secondary | ICD-10-CM | POA: Diagnosis not present

## 2017-11-17 DIAGNOSIS — I10 Essential (primary) hypertension: Secondary | ICD-10-CM | POA: Diagnosis not present

## 2017-11-17 MED ORDER — DOXYCYCLINE HYCLATE 100 MG PO CAPS: 100.0000 mg | ORAL_CAPSULE | Freq: Two times a day (BID) | ORAL | 0 refills | Status: DC

## 2017-11-17 NOTE — Patient Instructions (Signed)
Take antibiotic with a snack and a glass of water

## 2017-11-17 NOTE — Progress Notes (Signed)
   Subjective:    Patient ID: [name], male    DOB: 12-Mar-1926, 84 y.o.   MRN: [id number]  Toe Pain   The incident occurred more than 1 week ago. Incident location: had toenail cut a couple weeks ago. The pain is present in the left knee and left foot. Associated symptoms include an inability to bear weight.    Patient with significant toe pain discomfort redness denies high fever chills sweats nausea vomiting diarrhea  Review of Systems  Constitutional: Negative for activity change.  HENT: Negative for congestion and rhinorrhea.   Respiratory: Negative for cough and shortness of breath.   Cardiovascular: Negative for chest pain.  Gastrointestinal: Negative for abdominal pain, diarrhea, nausea and vomiting.  Genitourinary: Negative for dysuria and hematuria.  Neurological: Negative for weakness and headaches.  Psychiatric/Behavioral: Negative for behavioral problems and confusion.       Objective:   Physical Exam  Constitutional: He appears well-nourished. No distress.  HENT:  Head: Normocephalic and atraumatic.  Eyes: Right eye exhibits no discharge. Left eye exhibits no discharge.  Neck: No tracheal deviation present.  Cardiovascular: Normal rate, regular rhythm and normal heart sounds.  No murmur heard. Pulmonary/Chest: Effort normal and breath sounds normal. No respiratory distress.  Musculoskeletal: He exhibits no edema.  Lymphadenopathy:    He has no cervical adenopathy.  Neurological: He is alert. Coordination normal.  Skin: Skin is warm and dry.  Psychiatric: He has a normal mood and affect. His behavior is normal.  Vitals reviewed.         Assessment & Plan:  Toe pain with cellulitis Recommend antibiotic Should gradually get better I find no evidence of DVT Mild osteoarthritis in the knee  Comprehensive lab work with wellness exam in 1 month

## 2017-11-24 DIAGNOSIS — Z79899 Other long term (current) drug therapy: Secondary | ICD-10-CM | POA: Diagnosis not present

## 2017-11-24 DIAGNOSIS — E7849 Other hyperlipidemia: Secondary | ICD-10-CM | POA: Diagnosis not present

## 2017-11-24 DIAGNOSIS — L03032 Cellulitis of left toe: Secondary | ICD-10-CM | POA: Diagnosis not present

## 2017-11-24 DIAGNOSIS — I10 Essential (primary) hypertension: Secondary | ICD-10-CM | POA: Diagnosis not present

## 2017-11-24 DIAGNOSIS — M1000 Idiopathic gout, unspecified site: Secondary | ICD-10-CM | POA: Diagnosis not present

## 2017-11-25 ENCOUNTER — Encounter: Payer: Self-pay | Admitting: Family Medicine

## 2017-11-25 LAB — LIPID PANEL
Chol/HDL Ratio: 2.2 ratio (ref 0.0–5.0)
Cholesterol, Total: 155 mg/dL (ref 100–199)
HDL: 70 mg/dL (ref >39)
LDL CALC: 71 mg/dL (ref 0–99)
Triglycerides: 72 mg/dL (ref 0–149)
VLDL Cholesterol Cal: 14 mg/dL (ref 5–40)

## 2017-11-25 LAB — HEPATIC FUNCTION PANEL
ALBUMIN: 4.1 g/dL (ref 3.5–4.7)
ALT: 10 IU/L (ref 0–44)
AST: 17 IU/L (ref 0–40)
Alkaline Phosphatase: 65 IU/L (ref 39–117)
BILIRUBIN TOTAL: 2.5 mg/dL — AB (ref 0.0–1.2)
Bilirubin, Direct: 0.36 mg/dL (ref 0.00–0.40)
Total Protein: 6.7 g/dL (ref 6.0–8.5)

## 2017-11-25 LAB — BASIC METABOLIC PANEL
BUN/Creatinine Ratio: 11 (ref 10–24)
BUN: 15 mg/dL (ref 8–27)
CALCIUM: 9.3 mg/dL (ref 8.6–10.2)
CHLORIDE: 100 mmol/L (ref 96–106)
CO2: 24 mmol/L (ref 20–29)
Creatinine, Ser: 1.35 mg/dL — ABNORMAL HIGH (ref 0.76–1.27)
GFR calc Af Amer: 53 mL/min/{1.73_m2} — ABNORMAL LOW (ref >59)
GFR calc non Af Amer: 46 mL/min/{1.73_m2} — ABNORMAL LOW (ref >59)
Glucose: 97 mg/dL (ref 65–99)
Potassium: 4 mmol/L (ref 3.5–5.2)
Sodium: 143 mmol/L (ref 134–144)

## 2017-11-25 LAB — URIC ACID: Uric Acid: 6.8 mg/dL (ref 3.7–8.6)

## 2017-11-30 ENCOUNTER — Encounter (HOSPITAL_COMMUNITY): Payer: Self-pay

## 2017-11-30 ENCOUNTER — Emergency Department (HOSPITAL_COMMUNITY): Payer: Medicare HMO

## 2017-11-30 ENCOUNTER — Emergency Department (HOSPITAL_COMMUNITY)
Admission: EM | Admit: 2017-11-30 | Discharge: 2017-11-30 | Disposition: A | Discharge status: Home / Self Care | Payer: Medicare HMO | Attending: Emergency Medicine | Admitting: Emergency Medicine

## 2017-11-30 ENCOUNTER — Other Ambulatory Visit: Payer: Self-pay

## 2017-11-30 DIAGNOSIS — Z79899 Other long term (current) drug therapy: Secondary | ICD-10-CM | POA: Diagnosis not present

## 2017-11-30 DIAGNOSIS — M545 Low back pain, unspecified: Secondary | ICD-10-CM

## 2017-11-30 DIAGNOSIS — I10 Essential (primary) hypertension: Secondary | ICD-10-CM | POA: Insufficient documentation

## 2017-11-30 DIAGNOSIS — Z7982 Long term (current) use of aspirin: Secondary | ICD-10-CM | POA: Insufficient documentation

## 2017-11-30 DIAGNOSIS — J45909 Unspecified asthma, uncomplicated: Secondary | ICD-10-CM | POA: Insufficient documentation

## 2017-11-30 DIAGNOSIS — I714 Abdominal aortic aneurysm, without rupture: Secondary | ICD-10-CM | POA: Insufficient documentation

## 2017-11-30 DIAGNOSIS — Z96651 Presence of right artificial knee joint: Secondary | ICD-10-CM | POA: Insufficient documentation

## 2017-11-30 DIAGNOSIS — R69 Illness, unspecified: Secondary | ICD-10-CM | POA: Diagnosis not present

## 2017-11-30 DIAGNOSIS — F17210 Nicotine dependence, cigarettes, uncomplicated: Secondary | ICD-10-CM | POA: Diagnosis not present

## 2017-11-30 MED ORDER — TRAMADOL HCL 50 MG PO TABS: 50.0000 mg | ORAL_TABLET | Freq: Four times a day (QID) | ORAL | 0 refills | Status: DC | PRN

## 2017-11-30 NOTE — ED Triage Notes (Signed)
Pt reports right hip pain for 2 weeks , now pain in lower back and radiating into legs since Sunday. No injury

## 2017-11-30 NOTE — ED Notes (Signed)
Pt describes having pain in right hip that radiates down leg

## 2017-11-30 NOTE — ED Provider Notes (Signed)
[facility] EMERGENCY DEPARTMENT Provider Note   CSN: [id number] Arrival date & time: 11/30/17  4627     History   Chief Complaint Chief Complaint  Patient presents with  . Back Pain    HPI [name] is a 84 y.o. male.  Patient with complaint of some right lower back pain that radiates into the buttocks for a couple weeks.  Is gotten worse since Sunday.  No fall or injury.  But today the pain was fairly severe he thought may be due to the pain is leg was going to give out.  Denies any weakness or numbness to his leg.  Pain is on the right side.  No prior history of similar problem.  Patient thought perhaps maybe the pain was coming from his right hip.     Past Medical History:  Diagnosis Date  . Asthma   . Diverticulosis   . GERD (gastroesophageal reflux disease)   . Gout   . Heart murmur    Evaluation by a cardiologist years ago was reportedly negative  . HTN (hypertension)   . Osteoarthritis   . Reflux     Patient Active Problem List   Diagnosis Date Noted  . Chest pain 09/24/2016  . AAA (abdominal aortic aneurysm) ([place]) 09/24/2016  . Cognitive dysfunction 12/27/2014  . Hyperlipidemia 12/27/2014  . Gout 01/11/2014  . HTN (hypertension) 01/11/2014  . Anemia, iron deficiency 04/03/2013  . Loss of weight 03/20/2013  . S/P total knee replacement 09/16/2010  . GASTROESOPHAGEAL REFLUX DISEASE 05/08/2010  . DIVERTICULOSIS, COLON 05/08/2010  . TOBACCO ABUSE 05/08/2010  . KNEE, ARTHRITIS, DEGEN./OSTEO 04/05/2008    Past Surgical History:  Procedure Laterality Date  . APPENDECTOMY  2007  . COLONOSCOPY  2003  . COLONOSCOPY N/A 12/28/2012   Procedure: COLONOSCOPY;  Surgeon: Rogene Houston, MD;  Location: [facility] ENDO SUITE;  Service: Endoscopy;  Laterality: N/A;  255-rescheduled to 10:30 Ann notified pt  . EYE SURGERY Right    cataract removal  . INGUINAL HERNIA REPAIR     Right  . JOINT REPLACEMENT     right total knee RIGHT total knee arthroplasty  .  TOTAL KNEE ARTHROPLASTY  09/2009   Right        Home Medications    Prior to Admission medications   Medication Sig Start Date End Date Taking? Authorizing Provider  acetaminophen (TYLENOL) 650 MG CR tablet Take 650-1,300 mg by mouth every 8 (eight) hours as needed for pain.    [provider]  albuterol (PROAIR HFA) 108 (90 Base) MCG/ACT inhaler Inhale 2 puffs into the lungs every 4 (four) hours as needed. 01/01/17   Mikey Kirschner, MD  allopurinol (ZYLOPRIM) 300 MG tablet TAKE ONE TABLET DAILY FOR GOUT. 08/13/17   [name], MD  aspirin EC 81 MG tablet Take 81 mg by mouth daily.    [provider]  docusate sodium (COLACE) 100 MG capsule Take 2 capsules (200 mg total) by mouth at bedtime. Patient taking differently: Take 200 mg by mouth daily as needed.  12/28/12   Rogene Houston, MD  doxycycline (VIBRAMYCIN) 100 MG capsule Take 1 capsule (100 mg total) by mouth 2 (two) times daily. 11/17/17   [name], MD  guaiFENesin (MUCINEX) 600 MG 12 hr tablet Take 1 tablet (600 mg total) by mouth 2 (two) times daily as needed. 09/25/16   Kathie Dike, MD  mometasone (ELOCON) 0.1 % cream APPLY TO AFFECTED AREAS TWICE DAILY FOR 3 WEEKS.  06/16/17   [name], MD  potassium chloride (K-DUR,KLOR-CON) 10 MEQ tablet TAKE (1) TABLET BY MOUTH ONCE DAILY. 09/23/17   [name], MD  ranitidine (ZANTAC) 150 MG tablet TAKE (1) TABLET BY MOUTH TWICE DAILY. 07/08/17   [name], MD  rosuvastatin (CRESTOR) 20 MG tablet Take 1 tablet (20 mg total) by mouth daily. 10/02/16 12/31/16  Fay Records, MD  torsemide (DEMADEX) 20 MG tablet TAKE 1/2 TABLET BY MOUTH EVERY MORNING. 07/08/17   [name], MD  traMADol (ULTRAM) 50 MG tablet Take 1 tablet (50 mg total) by mouth every 6 (six) hours as needed. 11/30/17   Fredia Sorrow, MD  triamcinolone cream (KENALOG) 0.1 % APPLY TO AFFECTED AREAS TWICE DAILY AS NEEDED. 02/03/13   Mikey Kirschner, MD    Family  History Family History  Problem Relation Age of Onset  . Hypertension Mother   . Hypertension Father   . Heart attack Father   . Cancer Brother        colon  . Arthritis Other   . Heart disease Other        Male < 55  . Uterine cancer Maternal Aunt     Social History Social History   Tobacco Use  . Smoking status: Current Some Day Smoker    Types: Pipe    Start date: 12/04/1939  . Smokeless tobacco: Never Used  . Tobacco comment: quit smoking cigarettes in 1971  Substance Use Topics  . Alcohol use: No  . Drug use: No     Allergies   Penicillins   Review of Systems Review of Systems  Constitutional: Negative for fever.  HENT: Negative for congestion.   Eyes: Negative for redness.  Respiratory: Negative for shortness of breath.   Cardiovascular: Negative for chest pain.  Gastrointestinal: Negative for abdominal pain.  Genitourinary: Negative for dysuria.  Musculoskeletal: Positive for back pain.  Skin: Negative for rash.  Neurological: Negative for weakness and numbness.  Hematological: Does not bruise/bleed easily.  Psychiatric/Behavioral: Negative for confusion.     Physical Exam Updated Vital Signs BP (!) 157/77   Pulse 65   Temp 97.8 F (36.6 C) (Oral)   Resp 14   SpO2 97%   Physical Exam  Constitutional: He is oriented to person, place, and time. He appears well-developed and well-nourished. No distress.  HENT:  Head: Normocephalic and atraumatic.  Mouth/Throat: Oropharynx is clear and moist.  Eyes: Pupils are equal, round, and reactive to light. Conjunctivae and EOM are normal.  Neck: Normal range of motion. Neck supple.  Cardiovascular: Normal rate, regular rhythm and normal heart sounds.  Pulmonary/Chest: Effort normal and breath sounds normal. No respiratory distress.  Abdominal: Soft. Bowel sounds are normal. There is no tenderness.  Musculoskeletal: Normal range of motion. He exhibits no edema.  Patient with excellent range of motion of  both lower extremities at the hip and knee.  Does not elicit any pain.  No pain to palpation to the right lower back area.  Neurological: He is alert and oriented to person, place, and time. No cranial nerve deficit or sensory deficit. He exhibits normal muscle tone. Coordination normal.  Skin: Skin is warm.  Nursing note and vitals reviewed.    ED Treatments / Results  Labs (all labs ordered are listed, but only abnormal results are displayed) Labs Reviewed - No data to display  EKG None  Radiology Dg Lumbar Spine Complete  Result Date: 11/30/2017 CLINICAL DATA:  Low back pain  radiating into both legs for the past 2 days. No known injury. Two week history of right hip pain. EXAM: LUMBAR SPINE - COMPLETE 4+ VIEW COMPARISON:  Coronal and sagittal reconstructed images through the lumbar spine from an abdominal and pelvic CT scan of May 27, 2016. FINDINGS: The lumbar vertebral bodies are preserved in height. The pedicles and transverse processes are intact. There is grade 1 anterolisthesis of L3 with respect L4 which is stable. There is moderate disc space narrowing at this level. The disc space heights at the other levels are reasonably well-maintained. There is mild multilevel facet joint hypertrophy. No pars defects are observed. IMPRESSION: Moderate degenerative disc disease centered at L3-4 with stable grade 1 anterolisthesis. No compression fracture nor other acute bony abnormality. Electronically Signed   By: David  Martinique M.D.   On: 11/30/2017 11:10    Procedures Procedures (including critical care time)  Medications Ordered in ED Medications - No data to display   Initial Impression / Assessment and Plan / ED Course  I have reviewed the triage vital signs and the nursing notes.  Pertinent labs & imaging results that were available during my care of the patient were reviewed by me and considered in my medical decision making (see chart for details).     Patient with a  complaint of right lower back pain that radiates into the buttocks.  Has no pain going down the back of the leg no numbness or weakness to the foot.  The symptoms started about 2 weeks ago would come and go but now is been worse today.  Patient has primary care doctor.  X-rays of the lumbar back showed no evidence of any acute fracture.  Does have some degenerative changes.  Which could be responsible for the pain.  We will treat with tramadol and have him follow-up with his primary care doctor.  No evidence of sciatica.  No concerns for cauda equina at this time.  Final Clinical Impressions(s) / ED Diagnoses   Final diagnoses:  Acute right-sided low back pain without sciatica    ED Discharge Orders         Ordered    traMADol (ULTRAM) 50 MG tablet  Every 6 hours PRN     11/30/17 1218           Fredia Sorrow, MD 11/30/17 1222

## 2017-11-30 NOTE — Discharge Instructions (Signed)
Make an appointment to follow-up with Dr. Liborio Nixon.  Take the tramadol as directed.  Take it easy for a few days.  Return for new or worse symptoms.  Today's x-rays show no evidence of any significant bony abnormalities but there is some degenerative changes in the low part of your back which could be responsible for the pain.

## 2017-12-09 ENCOUNTER — Ambulatory Visit (INDEPENDENT_AMBULATORY_CARE_PROVIDER_SITE_OTHER): Payer: Medicare HMO | Admitting: Family Medicine

## 2017-12-09 VITALS — BP 152/88 | Ht 70.0 in | Wt 168.0 lb

## 2017-12-09 DIAGNOSIS — M5431 Sciatica, right side: Secondary | ICD-10-CM

## 2017-12-09 MED ORDER — TRAMADOL HCL 50 MG PO TABS: ORAL_TABLET | ORAL | 0 refills | Status: DC

## 2017-12-09 MED ORDER — PREDNISONE 10 MG PO TABS: ORAL_TABLET | ORAL | 0 refills | Status: DC

## 2017-12-09 NOTE — Progress Notes (Signed)
   Subjective:    Patient ID: [name], male    DOB: 08-12-1925, 84 y.o.   MRN: [id number]  HPI  Patient is here today to follow up on visit to the ed on 11/30/2017 for back pain that radiates down the right leg. BP elevated states he did not take his medications this am.  He was seen in ED on 11/30/17 for pain in right buttocks radiating down right leg. Reports pain has stayed about the same, has not seen ortho for this before. States pain has started about 2-3 months ago, just in the morning when he woke up, prior to going to ED pain stayed throughout the day. Denies fall or injury. Reports having to use a cane regularly now, when he hasn't previously. Reports a shooting/burning pain from right buttocks down to back of right knee, intermittent throughout the day, described as a toothache pain sometimes goes down into calf. Worse with certain movements. Taking tylenol but not helpful. Denies bowel or bladder incontinence.   Bilateral knee replacements done by Dr. Aline Brochure several years ago per pt.  Review of Systems  Gastrointestinal:       Negative for bowel incontinence.  Genitourinary:       Negative for bladder incontinence.  Musculoskeletal: Negative for back pain.       Pain to right buttocks down to back of knee       Objective:   Physical Exam  Constitutional: He is oriented to person, place, and time. He appears well-developed and well-nourished. No distress.  HENT:  Head: Normocephalic and atraumatic.  Eyes: Right eye exhibits no discharge. Left eye exhibits no discharge.  Neck: Normal range of motion. Neck supple.  Cardiovascular: Normal rate, regular rhythm and normal heart sounds.  Pulmonary/Chest: Effort normal and breath sounds normal. No respiratory distress.  Musculoskeletal: He exhibits no edema.  Tender to palpation over right sciatic notch, with right-sided SLR pain radiates down back of right leg down into calf. No pain with ROM of the right hip or knee.  Negative left-sided SLR.  Antalgic gait. No spinal tenderness or tenderness to low back musculature.   Neurological: He is alert and oriented to person, place, and time.  Skin: Skin is warm and dry.  Psychiatric: He has a normal mood and affect.  Nursing note and vitals reviewed.      Assessment & Plan:  1. Sciatica of right side  Given history and exam most likely this is sciatica, discussed treatment with low-dose prednisone taper as well as tramadol prescription given. He will f/u with Dr. Nicki Reaper in 2 weeks, if no improvement will likely need an MRI for further evaluation.  Warning signs discussed.   BP elevated today, likely due to him not taking his medication this morning, encouraged he be diligent about taking his medicine every day. We will keep a close eye on this at his next visit.  Full ER REport reviewed in presence of pt  As attending physician to this patient visit, this patient was seen in conjunction with the nurse practitioner.  The history,physical and treatment plan was reviewed with the nurse practitioner and pertinent findings were verified with the patient.  Also the treatment plan was reviewed with the patient while they were present. WSL MD

## 2017-12-15 ENCOUNTER — Ambulatory Visit (INDEPENDENT_AMBULATORY_CARE_PROVIDER_SITE_OTHER): Payer: Medicare HMO | Admitting: Family Medicine

## 2017-12-15 ENCOUNTER — Encounter: Payer: Self-pay | Admitting: Family Medicine

## 2017-12-15 VITALS — BP 128/84 | Ht 70.0 in | Wt 170.8 lb

## 2017-12-15 DIAGNOSIS — M5431 Sciatica, right side: Secondary | ICD-10-CM | POA: Diagnosis not present

## 2017-12-15 MED ORDER — TRAMADOL HCL 50 MG PO TABS: ORAL_TABLET | ORAL | 0 refills | Status: DC

## 2017-12-15 MED ORDER — TRAMADOL HCL 50 MG PO TABS: ORAL_TABLET | ORAL | 1 refills | Status: DC

## 2017-12-15 NOTE — Patient Instructions (Signed)
May use Tramadol for pain Recheck here in 6 weeks Recheck sooner if worse    Sciatica Sciatica is pain, numbness, weakness, or tingling along your sciatic nerve. The sciatic nerve starts in the lower back and goes down the back of each leg. Sciatica happens when this nerve is pinched or has pressure put on it. Sciatica usually goes away on its own or with treatment. Sometimes, sciatica may keep coming back (recur). Follow these instructions at home: Medicines  Take over-the-counter and prescription medicines only as told by your doctor.  Do not drive or use heavy machinery while taking prescription pain medicine. Managing pain  If directed, put ice on the affected area. ? Put ice in a plastic bag. ? Place a towel between your skin and the bag. ? Leave the ice on for 20 minutes, 2-3 times a day.  After icing, apply heat to the affected area before you exercise or as often as told by your doctor. Use the heat source that your doctor tells you to use, such as a moist heat pack or a heating pad. ? Place a towel between your skin and the heat source. ? Leave the heat on for 20-30 minutes. ? Remove the heat if your skin turns bright red. This is especially important if you are unable to feel pain, heat, or cold. You may have a greater risk of getting burned. Activity  Return to your normal activities as told by your doctor. Ask your doctor what activities are safe for you. ? Avoid activities that make your sciatica worse.  Take short rests during the day. Rest in a lying or standing position. This is usually better than sitting to rest. ? When you rest for a long time, do some physical activity or stretching between periods of rest. ? Avoid sitting for a long time without moving. Get up and move around at least one time each hour.  Exercise and stretch regularly, as told by your doctor.  Do not lift anything that is heavier than 10 lb (4.5 kg) while you have symptoms of  sciatica. ? Avoid lifting heavy things even when you do not have symptoms. ? Avoid lifting heavy things over and over.  When you lift objects, always lift in a way that is safe for your body. To do this, you should: ? Bend your knees. ? Keep the object close to your body. ? Avoid twisting. General instructions  Use good posture. ? Avoid leaning forward when you are sitting. ? Avoid hunching over when you are standing.  Stay at a healthy weight.  Wear comfortable shoes that support your feet. Avoid wearing high heels.  Avoid sleeping on a mattress that is too soft or too hard. You might have less pain if you sleep on a mattress that is firm enough to support your back.  Keep all follow-up visits as told by your doctor. This is important. Contact a doctor if:  You have pain that: ? Wakes you up when you are sleeping. ? Gets worse when you lie down. ? Is worse than the pain you have had in the past. ? Lasts longer than 4 weeks.  You lose weight for without trying. Get help right away if:  You cannot control when you pee (urinate) or poop (have a bowel movement).  You have weakness in any of these areas and it gets worse. ? Lower back. ? Lower belly (pelvis). ? Butt (buttocks). ? Legs.  You have redness or swelling of your  back.  You have a burning feeling when you pee. This information is not intended to replace advice given to you by your health care provider. Make sure you discuss any questions you have with your health care provider. Document Released: 12/10/2007 Document Revised: 08/08/2015 Document Reviewed: 11/09/2014 Elsevier Interactive Patient Education  [employer].

## 2017-12-15 NOTE — Progress Notes (Signed)
   Subjective:    Patient ID: [name], male    DOB: [birth year], 84 y.o.   MRN: [id number]  HPI Pt here today for follow up on sciatica. Pt states once he takes his medication he is fine.  Patient states that prednisone seems to help he denies any severe issues He also relates intermittent pain down the leg tramadol helps when he uses it He denies any numbness or weakness He does use a cane to walk around  Review of Systems  Constitutional: Negative for activity change, appetite change and fatigue.  HENT: Negative for congestion and rhinorrhea.   Respiratory: Negative for cough and shortness of breath.   Cardiovascular: Negative for chest pain and leg swelling.  Gastrointestinal: Negative for abdominal pain, nausea and vomiting.  Neurological: Negative for dizziness and headaches.  Psychiatric/Behavioral: Negative for agitation and behavioral problems.       Objective:   Physical Exam  Constitutional: He appears well-nourished. No distress.  HENT:  Head: Normocephalic and atraumatic.  Eyes: Right eye exhibits no discharge. Left eye exhibits no discharge.  Neck: No tracheal deviation present.  Cardiovascular: Normal rate, regular rhythm and normal heart sounds.  No murmur heard. Pulmonary/Chest: Effort normal and breath sounds normal. No respiratory distress.  Musculoskeletal: He exhibits no edema.  Lymphadenopathy:    He has no cervical adenopathy.  Neurological: He is alert. Coordination normal.  Skin: Skin is warm and dry.  Psychiatric: He has a normal mood and affect. His behavior is normal.  Vitals reviewed.  Patient does use a cane to walk but is able to walk without difficulty Has good strength in his legs       Assessment & Plan:  Sciatica down right leg Uses tramadol for pain Should gradually get better over time If progressive troubles or worse to follow-up Recheck in 4 to 6 weeks Hold off on any type of MRI currently

## 2017-12-21 ENCOUNTER — Other Ambulatory Visit: Payer: Self-pay | Admitting: Family Medicine

## 2017-12-24 DIAGNOSIS — K5900 Constipation, unspecified: Secondary | ICD-10-CM | POA: Diagnosis not present

## 2017-12-24 DIAGNOSIS — Z7982 Long term (current) use of aspirin: Secondary | ICD-10-CM | POA: Diagnosis not present

## 2017-12-24 DIAGNOSIS — M1990 Unspecified osteoarthritis, unspecified site: Secondary | ICD-10-CM | POA: Diagnosis not present

## 2017-12-24 DIAGNOSIS — I951 Orthostatic hypotension: Secondary | ICD-10-CM | POA: Diagnosis not present

## 2017-12-24 DIAGNOSIS — M792 Neuralgia and neuritis, unspecified: Secondary | ICD-10-CM | POA: Diagnosis not present

## 2017-12-24 DIAGNOSIS — K219 Gastro-esophageal reflux disease without esophagitis: Secondary | ICD-10-CM | POA: Diagnosis not present

## 2017-12-24 DIAGNOSIS — Z79891 Long term (current) use of opiate analgesic: Secondary | ICD-10-CM | POA: Diagnosis not present

## 2017-12-24 DIAGNOSIS — R69 Illness, unspecified: Secondary | ICD-10-CM | POA: Diagnosis not present

## 2017-12-24 DIAGNOSIS — I10 Essential (primary) hypertension: Secondary | ICD-10-CM | POA: Diagnosis not present

## 2017-12-27 ENCOUNTER — Other Ambulatory Visit: Payer: Self-pay | Admitting: Family Medicine

## 2017-12-27 NOTE — Telephone Encounter (Signed)
May have this +1 refill 

## 2017-12-29 ENCOUNTER — Ambulatory Visit: Payer: Medicare HMO | Admitting: Family Medicine

## 2018-01-03 ENCOUNTER — Telehealth: Payer: Self-pay | Admitting: Orthopedic Surgery

## 2018-01-03 ENCOUNTER — Ambulatory Visit (INDEPENDENT_AMBULATORY_CARE_PROVIDER_SITE_OTHER): Payer: Medicare HMO | Admitting: Orthopedic Surgery

## 2018-01-03 ENCOUNTER — Ambulatory Visit (INDEPENDENT_AMBULATORY_CARE_PROVIDER_SITE_OTHER): Payer: Medicare HMO

## 2018-01-03 VITALS — Ht 70.0 in | Wt 168.0 lb

## 2018-01-03 DIAGNOSIS — Z96651 Presence of right artificial knee joint: Secondary | ICD-10-CM | POA: Diagnosis not present

## 2018-01-03 DIAGNOSIS — G8929 Other chronic pain: Secondary | ICD-10-CM | POA: Diagnosis not present

## 2018-01-03 DIAGNOSIS — M545 Low back pain: Secondary | ICD-10-CM

## 2018-01-03 DIAGNOSIS — R29898 Other symptoms and signs involving the musculoskeletal system: Secondary | ICD-10-CM | POA: Diagnosis not present

## 2018-01-03 DIAGNOSIS — Z96652 Presence of left artificial knee joint: Secondary | ICD-10-CM

## 2018-01-03 NOTE — Telephone Encounter (Signed)
Patient's daughter [name] came back in stating that her Dad,  [name] forgot to ask [name] about something stronger for his pain.  She said he wasn't taking the Tramadol that he was given because it was not strong enough.  I told her that I would have send in a message to Troy. Aline Miles regarding this request.  She will check back with [place] later  She states he uses [facility] [facility]

## 2018-01-03 NOTE — Progress Notes (Signed)
Progress Note   Patient ID: [name], male   DOB: Jan 23, 1926, 84 y.o.   MRN: [id number]   Chief Complaint  Patient presents with  . Follow-up    Recheck on bilateral knee replacements, right 09-21-09. Left 06-09-10.    HPI 84 year old male status post bilateral total knees right knee 2011 left knee 2012 those are functioning well however  He has a new complaint of weakness in his lower extremities frequent falls right hip pain (which is in his lower back).  Pain is moderate to severe unrelieved by tramadol quality is a dull ache present for 1 month He says he has lost some weight over the last few months   Review of Systems  Constitutional: Positive for weight loss. Negative for chills and fever.  Gastrointestinal: Negative.   Genitourinary: Negative.   Musculoskeletal: Positive for falls.  Neurological: Positive for weakness.   No outpatient medications have been marked as taking for the 01/03/18 encounter (Office Visit) with Carole Civil, MD.    Past Medical History:  Diagnosis Date  . Asthma   . Diverticulosis   . GERD (gastroesophageal reflux disease)   . Gout   . Heart murmur    Evaluation by a cardiologist years ago was reportedly negative  . HTN (hypertension)   . Osteoarthritis   . Reflux      Allergies  Allergen Reactions  . Penicillins Rash    Has patient had a PCN reaction causing immediate rash, facial/tongue/throat swelling, SOB or lightheadedness with hypotension: Yes Has patient had a PCN reaction causing severe rash involving mucus membranes or skin necrosis: No Has patient had a PCN reaction that required hospitalization: No Has patient had a PCN reaction occurring within the last 10 years: No If all of the above answers are "NO", then may proceed with Cephalosporin use.      Ht 5\' 10"  (1.778 m)   Wt 168 lb (76.2 kg)   BMI 24.11 kg/m    Physical Exam General appearance normal Oriented x3 normal Mood pleasant affect  normal Gait poor functional gait requiring a cane  Ortho Exam   Tenderness in the right lower back and right SI joint with no specific malalignment muscle tension increase and no skin changes  Right knee incision looks good motion is excellent stability is normal no tenderness or swelling his quadricep strength is weak  Left knee incision looks good range of motion is excellent no contractures are seen no tenderness or swelling joint is stable muscle tone and strength show weakness in the hip extensor and flexor  Both lower extremities: No sensory changes are noted pulses are good distally in each leg   MEDICAL DECISION MAKING   Imaging:  Lateral knee films show stable prostheses both knees see dictated report  Encounter Diagnoses  Name Primary?  . S/P total knee replacement, right 09/21/09 Yes  . S/P total knee replacement, left 06/09/10   . Chronic bilateral low back pain without sciatica   . Weakness of both lower extremities      PLAN: (RX., injection, surgery,frx,mri/ct, XR 2 body ares) Knees are stable for 1 year follow-up is back pain and weakness will be treated with physical therapy with 6-week follow-up + lumbar spine film  No orders of the defined types were placed in this encounter.  3:04 PM 01/03/2018

## 2018-01-03 NOTE — Telephone Encounter (Signed)
None available

## 2018-01-04 NOTE — Telephone Encounter (Signed)
I called and spoke to [name].  I told him that Dr. Aline Brochure could not give him anything.  He said he was hurting.  I told him that he may want to call his PCP to advise regarding any pain meds.  He said he would do this

## 2018-01-04 NOTE — Telephone Encounter (Signed)
Dr Aline Brochure has indicated the physical therapy will help his pain and they can call to schedule, I have given them the number to call and make the appt.  [phone]

## 2018-01-12 ENCOUNTER — Other Ambulatory Visit: Payer: Self-pay

## 2018-01-12 ENCOUNTER — Ambulatory Visit (HOSPITAL_COMMUNITY): Payer: Medicare HMO | Attending: Orthopedic Surgery

## 2018-01-12 ENCOUNTER — Encounter (HOSPITAL_COMMUNITY): Payer: Self-pay

## 2018-01-12 DIAGNOSIS — M6281 Muscle weakness (generalized): Secondary | ICD-10-CM | POA: Diagnosis not present

## 2018-01-12 DIAGNOSIS — G8929 Other chronic pain: Secondary | ICD-10-CM | POA: Insufficient documentation

## 2018-01-12 DIAGNOSIS — M545 Low back pain, unspecified: Secondary | ICD-10-CM

## 2018-01-12 DIAGNOSIS — R2689 Other abnormalities of gait and mobility: Secondary | ICD-10-CM

## 2018-01-12 NOTE — Therapy (Signed)
[place], [place], [address] Phone: [phone]   Fax:  [phone]  Physical Therapy Evaluation  Patient Details  Name: [name] MRN: [id number] Date of Birth: 25-Apr-1925 Referring Provider (PT): Carole Civil, MD   Encounter Date: 01/12/2018  PT End of Session - 01/12/18 1425    Visit Number  1    Number of Visits  13    Date for PT Re-Evaluation  02/23/18    Authorization Type  [facility] Medicare PPO (no visit limit, based on med necessity, $40 copay)    Authorization Time Period  01/12/18 - 02/25/18    Authorization - Visit Number  1    Authorization - Number of Visits  13    PT Start Time  1304    PT Stop Time  1350    PT Time Calculation (min)  46 min    Activity Tolerance  Patient tolerated treatment well    Behavior During Therapy  [facility] for tasks assessed/performed       Past Medical History:  Diagnosis Date  . Asthma   . Diverticulosis   . GERD (gastroesophageal reflux disease)   . Gout   . Heart murmur    Evaluation by a cardiologist years ago was reportedly negative  . HTN (hypertension)   . Osteoarthritis   . Reflux     Past Surgical History:  Procedure Laterality Date  . APPENDECTOMY  2007  . COLONOSCOPY  2003  . COLONOSCOPY N/A 12/28/2012   Procedure: COLONOSCOPY;  Surgeon: Rogene Houston, MD;  Location: [facility];  Service: Endoscopy;  Laterality: N/A;  255-rescheduled to 10:30 Ann notified pt  . EYE SURGERY Right    cataract removal  . INGUINAL HERNIA REPAIR     Right  . JOINT REPLACEMENT     right total knee RIGHT total knee arthroplasty  . TOTAL KNEE ARTHROPLASTY  09/2009   Right    There were no vitals filed for this visit.   Subjective Assessment - 01/12/18 1307    Subjective  Patient reports he started having back pain last August and it has been running into his Rt hip and buttock. He states it comes and goes and had improved a little bit. He  states his pain started to get worse in September and that he has some pain medication that he is taking but that it doesn't always help. He denies pain at this time and reports sometimes it hurts when he has been sitting for long periods and tends to be worse in the morning.    Limitations  Sitting;House hold activities    How long can you sit comfortably?  sittign too long aggravated back and Rt butt/hip    Patient Stated Goals  be able to walk further    Currently in Pain?  No/denies         [facility] PT Assessment - 01/12/18 0001      Assessment   Medical Diagnosis  Low Back Pain and Weakness    Referring Provider (PT)  Carole Civil, MD    Hand Dominance  Right    Prior Therapy  for total knee replacements several years ago      Precautions   Precautions  Fall      Restrictions   Weight Bearing Restrictions  No      Balance Screen   Has the patient fallen in the past 6 months  Yes    How  many times?  3   sometimes when getting up sometimes when walking   Has the patient had a decrease in activity level because of a fear of falling?   Yes    Is the patient reluctant to leave their home because of a fear of falling?   No      Home Environment   Living Environment  Private residence    Living Arrangements  Spouse/significant other    Available Help at Discharge  Family   5 children and grandchildren   Type of [place] entrance;Stairs to enter    Entrance Stairs-Number of Steps  6    Entrance Stairs-Rails  Can reach both    [place]  One level;Laundry or work area in Production assistant, radio - 2 wheels;Cane - single point;Shower seat;Grab bars - toilet;Grab bars - tub/shower   walk in shower   Additional Comments  patient uses SPC for mobility. He has grab bars down the hallway in his house and in the bathroom.      Prior Function   Level of Independence  Independent    Vocation  Retired    Leisure  mowing my yard, play with  grandkids      Cognition   Overall Cognitive Status  Within Functional Limits for tasks assessed      Observation/Other Assessments   Focus on Therapeutic Outcomes (FOTO)   incorrect FOTO set up      Posture/Postural Control   Posture/Postural Control  Postural limitations    Postural Limitations  Rounded Shoulders;Forward head;Decreased lumbar lordosis      ROM / Strength   AROM / PROM / Strength  Strength;AROM      AROM   AROM Assessment Site  Lumbar    Lumbar Flexion  32    Lumbar Extension  10    Lumbar - Right Side Bend  12    Lumbar - Left Side Bend  21    Lumbar - Right Rotation  5    Lumbar - Left Rotation  5      Strength   Strength Assessment Site  Knee;Hip;Ankle    Right Hip Flexion  4+/5    Right Hip Extension  3+/5   limited by ROM   Right Hip ABduction  4+/5    Left Hip Flexion  4+/5    Left Hip Extension  4+/5    Left Hip ABduction  4+/5    Right/Left Knee  Right;Left    Right Knee Flexion  4/5    Right Knee Extension  4+/5    Left Knee Flexion  4+/5    Left Knee Extension  4+/5    Right Ankle Dorsiflexion  4/5    Left Ankle Dorsiflexion  4/5      Palpation   Spinal mobility  hypomobile in lumbar spine with PA's    Palpation comment  tenderness along Rt glut max and glut medius at greater trochanter insertion      Special Tests    Special Tests  Hip Special Tests    Hip Special Tests   Hip Scouring;Patrick (FABER) Test;Other      Saralyn Pilar (FABER) Test   Findings  Negative    Side  Right      Hip Scouring   Findings  Negative    Side  Right      other   Findings  Negative    Side  Right    Comments  FADDIR      Transfers   Five time sit to stand comments   18.8 without UE use    Comments  patient with sway upon standing      Ambulation/Gait   Ambulation/Gait  Yes    Ambulation/Gait Assistance  6: Modified independent (Device/Increase time)    Ambulation Distance (Feet)  266 Feet   2MWT   Assistive device  Straight cane    Gait  Pattern  Step-through pattern;Decreased hip/knee flexion - right;Decreased hip/knee flexion - left;Decreased dorsiflexion - right;Decreased dorsiflexion - left;Decreased stride length;Narrow base of support;Poor foot clearance - left;Poor foot clearance - right;Scissoring    Ambulation Surface  Level;Indoor    Gait velocity  0.67 m/s    Stairs  Yes    Stairs Assistance  6: Modified independent (Device/Increase time)    Stair Management Technique  One rail Right;With cane    Number of Stairs  4    Height of Stairs  6      Balance   Balance Assessed  Yes      Static Standing Balance   Static Standing - Balance Support  No upper extremity supported    Static Standing - Level of Assistance  5: Stand by assistance    Static Standing Balance -  Activities   Single Leg Stance - Right Leg;Single Leg Stance - Left Leg;Tandam Stance - Right Leg;Tandam Stance - Left Leg;Romberg - Eyes Opened;Romberg - Eyes Closed    Static Standing - Comment/# of Minutes  SLS Rt = 5" SLS Lt = 4" Tandem Rt = 15" Tandem Lt = 15" Romberg EO = 20" Romberg EC = 20"      Standardized Balance Assessment   Standardized Balance Assessment  Dynamic Gait Index      Dynamic Gait Index   Level Surface  Mild Impairment    Change in Gait Speed  Mild Impairment    Gait with Horizontal Head Turns  Moderate Impairment    Gait with Vertical Head Turns  Mild Impairment    Gait and Pivot Turn  Moderate Impairment    Step Over Obstacle  Mild Impairment    Step Around Obstacles  Mild Impairment    Steps  Mild Impairment    Total Score  14       Objective measurements completed on examination: See above findings.    [facility] Adult PT Treatment/Exercise - 01/12/18 0001      Exercises   Exercises  Lumbar      Lumbar Exercises: Stretches   Standing Extension  5 reps;10 seconds         PT Education - 01/12/18 1423    Education Details  Educated on exam findings and plan for therapy. Provided initial HEP.    Person(s) Educated   Patient    Methods  Explanation;Handout    Comprehension  Verbalized understanding;Returned demonstration       PT Short Term Goals - 01/12/18 1428      PT SHORT TERM GOAL #1   Title  Patient will be independent with HEP, updated PRN, to improve LE strength and balance in order to improve safety and independance with mobility.    Time  2    Period  Weeks    Status  New    Target Date  01/26/18      PT SHORT TERM GOAL #2   Title  Patient will perform 5x sit to stand in 14 seconds or less without  UE assistance and no sign of LOB in standing to demonstrate improved balance tiwh transitional movements and improved functional LE strength.    Time  3    Period  Weeks    Status  New    Target Date  02/02/18        PT Long Term Goals - 01/12/18 1436      PT LONG TERM GOAL #1   Title  Patient will improve gait velocity in 2MWT to 0.8 m/s or faster with LRAD and no overt LOB, to demonstrate decreased fall risk, and improve access to community by falling within community ambulator category.     Time  6    Period  Weeks    Status  New    Target Date  02/23/18      PT LONG TERM GOAL #2   Title  Patient will perofrm SLS for 15 seconds on bil LE to improve safety with gait and stair mobility to improve safety around community when negotiating obstacles such as curbs.    Time  6    Period  Weeks    Status  New      PT LONG TERM GOAL #3   Title  Patient will improve DGI scor eby 4 points or more to indicate significant improvement in balance during ambulation to reduce fall risk.    Time  6    Period  Weeks    Status  New      PT LONG TERM GOAL #4   Title  Patient will report no increase in back pain over 2/10 for at least 1 full week to demosntrate improve activity tolerance and decreased pain with daily mobility/activities.    Time  6    Period  Weeks    Status  New        Plan - 01/12/18 1426    Clinical Impression Statement  Mr. Potempa presents to physical therapy for  low back pain and weakness. He has reported a significant history of falling as well. Objective testing reveals some weakness of hip muscle groups, Rt>Lt, impaired balance with static and dynamic testing, hypomobilities of lumbar spine and limited ROM, impalired gait, and decreased gait velocity. He has been educated on initial HEP to address low back stiffness and pain, and on appropriate plan to address strength and balance deficits. He will benefit from skilled PT interventions to address above impairments to reduce fall risk and maintain independence with functional mobility.    Clinical Presentation  Stable    Clinical Presentation due to:  MMT, ROM, SLS, Romberg, DGI, gait velocity, clinical judgement    Clinical Decision Making  Low    Rehab Potential  Good    PT Frequency  2x / week    PT Duration  6 weeks    PT Treatment/Interventions  ADLs/Self Care Home Management;Aquatic Therapy;Cryotherapy;Electrical Stimulation;Moist Heat;DME Instruction;Gait training;Stair training;Functional mobility training;Therapeutic activities;Therapeutic exercise;Balance training;Neuromuscular re-education;Patient/family education;Manual techniques;Passive range of motion    PT Next Visit Plan  Review eval and goals. Initiated balance training for SLS/tandem. Perform LE strengthening in standing and add sit to stands to HEP.     PT Home Exercise Plan  Eval: standing lumbar extension    Consulted and Agree with Plan of Care  Patient       Patient will benefit from skilled therapeutic intervention in order to improve the following deficits and impairments:  Abnormal gait, Decreased mobility, Postural dysfunction, Decreased activity tolerance, Decreased endurance, Decreased range of motion, Decreased  strength, Difficulty walking, Decreased balance  Visit Diagnosis: Chronic low back pain without sciatica, unspecified back pain laterality  Muscle weakness (generalized)  Other abnormalities of gait and  mobility     Problem List Patient Active Problem List   Diagnosis Date Noted  . S/P total knee replacement, left 06/09/10 01/03/2018  . Chest pain 09/24/2016  . AAA (abdominal aortic aneurysm) ([place]) 09/24/2016  . Cognitive dysfunction 12/27/2014  . Hyperlipidemia 12/27/2014  . Gout 01/11/2014  . HTN (hypertension) 01/11/2014  . Anemia, iron deficiency 04/03/2013  . Loss of weight 03/20/2013  . S/P total knee replacement, right 09/21/09 09/16/2010  . GASTROESOPHAGEAL REFLUX DISEASE 05/08/2010  . DIVERTICULOSIS, COLON 05/08/2010  . TOBACCO ABUSE 05/08/2010  . KNEE, ARTHRITIS, DEGEN./OSTEO 04/05/2008    Kipp Brood, PT, DPT Physical Therapist with [facility]  01/12/2018 2:54 PM    [place], [place], [address] Phone: [phone]   Fax:  [phone]  Name: [name] MRN: [id number] Date of Birth: 1925-04-16

## 2018-01-13 ENCOUNTER — Telehealth: Payer: Self-pay | Admitting: Family Medicine

## 2018-01-13 NOTE — Telephone Encounter (Signed)
Wife calling in regards of pt having excessive sweating, would like to know if this is normal, no other symptoms present. Patients wife states this has been going on for a weeks range. No DPR on file for wife for [facility].

## 2018-01-13 NOTE — Telephone Encounter (Signed)
Called pt and he states he only has  sweating when he is doing work. Off and on just when doing work. Pt states he is not having any problems, no fever. He feels fine like he always does. Does have some low back pain but states dr Nicki Reaper is aware of that. Pt advised to call us if he is having any problems.

## 2018-01-14 ENCOUNTER — Ambulatory Visit (HOSPITAL_COMMUNITY): Payer: Medicare HMO | Attending: Orthopedic Surgery | Admitting: Physical Therapy

## 2018-01-14 DIAGNOSIS — R2689 Other abnormalities of gait and mobility: Secondary | ICD-10-CM | POA: Diagnosis not present

## 2018-01-14 DIAGNOSIS — M545 Low back pain, unspecified: Secondary | ICD-10-CM

## 2018-01-14 DIAGNOSIS — M6281 Muscle weakness (generalized): Secondary | ICD-10-CM | POA: Diagnosis not present

## 2018-01-14 DIAGNOSIS — G8929 Other chronic pain: Secondary | ICD-10-CM | POA: Diagnosis not present

## 2018-01-14 NOTE — Patient Instructions (Addendum)
Hamstring Stretch: Active    Support behind right knee. Starting with knee bent, attempt to straighten knee until a comfortable stretch is felt in back of thigh. Hold ___30_ seconds. Repeat __3__ times per set. Do _1___ sets per session. Do __1__ sessions per day.  http://orth.exer.us/158   Copyright  VHI. All rights reserved.  Bridging    Slowly raise buttocks from floor, keeping stomach tight. Repeat _10___ times per set. Do __1__ sets per session. Do ___1_ sessions per day.  http://orth.exer.us/1096   Copyright  VHI. All rights reserved.  Feet Heel-Toe "Tandem", Head Motion - Eyes Closed   At the kitchen counter  With eyes closed and right foot directly in front of the other, Try and hold for 15 -30 seconds  Repeat 5___ times per session. Do ____ sessions per day. 1 Copyright  VHI. All rights reserved.  Piriformis Stretch, Sitting    Sit, back straight, one leg straight, other leg bent, ankle on opposite thigh.  of crossed leg. Pull leg . Feel stretch in hip . Hold _30__ seconds.  Repeat _3__ times per session. Do2 ___ sessions per day.  Copyright  VHI. All rights reserved.

## 2018-01-14 NOTE — Therapy (Signed)
[place], [place], [address] Phone: [phone]   Fax:  [phone]  Physical Therapy Treatment  Patient Details  Name: [name] MRN: [id number] Date of Birth: 08-Oct-1925 Referring Provider (PT): Carole Civil, MD   Encounter Date: 01/14/2018  PT End of Session - 01/14/18 1443    Visit Number  2    Number of Visits  13    Date for PT Re-Evaluation  02/23/18    Authorization Type  [facility] Medicare PPO (no visit limit, based on med necessity, $40 copay)    Authorization Time Period  01/12/18 - 02/25/18    Authorization - Visit Number  2    Authorization - Number of Visits  13    PT Start Time  1355    PT Stop Time  1435    PT Time Calculation (min)  40 min    Activity Tolerance  Patient tolerated treatment well    Behavior During Therapy  [facility] for tasks assessed/performed       Past Medical History:  Diagnosis Date  . Asthma   . Diverticulosis   . GERD (gastroesophageal reflux disease)   . Gout   . Heart murmur    Evaluation by a cardiologist years ago was reportedly negative  . HTN (hypertension)   . Osteoarthritis   . Reflux     Past Surgical History:  Procedure Laterality Date  . APPENDECTOMY  2007  . COLONOSCOPY  2003  . COLONOSCOPY N/A 12/28/2012   Procedure: COLONOSCOPY;  Surgeon: Rogene Houston, MD;  [facility];  Service: Endoscopy;  Laterality: N/A;  255-rescheduled to 10:30 Ann notified pt  . EYE SURGERY Right    cataract removal  . INGUINAL HERNIA REPAIR     Right  . JOINT REPLACEMENT     right total knee RIGHT total knee arthroplasty  . TOTAL KNEE ARTHROPLASTY  09/2009   Right    There were no vitals filed for this visit.  Subjective Assessment - 01/14/18 1357    Subjective  Pt states that he is doing much better; he even walked in his home without the cane a little bit.     Limitations  Sitting;House hold activities    How long can you sit  comfortably?  sitting  too long aggravated back and Rt butt/hip    Patient Stated Goals  be able to walk further    Currently in Pain?  Yes    Pain Score  2     Pain Location  Back    Pain Orientation  Lower    Pain Descriptors / Indicators  Aching    Pain Onset  More than a month ago    Pain Frequency  Intermittent             [facility] Adult PT Treatment/Exercise - 01/14/18 0001      Exercises   Exercises  Lumbar      Lumbar Exercises: Stretches   Active Hamstring Stretch  3 reps;30 seconds    Standing Extension  10 reps    Piriformis Stretch  2 reps;30 seconds      Lumbar Exercises: Standing   Heel Raises  10 reps    Functional Squats  --    Other Standing Lumbar Exercises  heel toe tandem 30" x 2     Other Standing Lumbar Exercises  wall arch x 5       Lumbar Exercises: Seated   Sit  to Stand Limitations  10    Other Seated Lumbar Exercises  sit as tall as possible hold x 5" x 10 ; scapular retraction x 10       Lumbar Exercises: Supine   Bridge  10 reps             PT Education - 01/14/18 1443    Education Details  new exercises     Person(s) Educated  Patient    Methods  Explanation;Demonstration    Comprehension  Returned demonstration;Need further instruction       PT Short Term Goals - 01/14/18 1447      PT SHORT TERM GOAL #1   Title  Patient will be independent with HEP, updated PRN, to improve LE strength and balance in order to improve safety and independance with mobility.    Time  2    Period  Weeks    Status  On-going      PT SHORT TERM GOAL #2   Title  Patient will perform 5x sit to stand in 14 seconds or less without UE assistance and no sign of LOB in standing to demonstrate improved balance tiwh transitional movements and improved functional LE strength.    Time  3    Period  Weeks    Status  On-going        PT Long Term Goals - 01/14/18 1447      PT LONG TERM GOAL #1   Title  Patient will improve gait velocity in 2MWT to 0.8  m/s or faster with LRAD and no overt LOB, to demonstrate decreased fall risk, and improve access to community by falling within community ambulator category.     Time  6    Period  Weeks    Status  On-going      PT LONG TERM GOAL #2   Title  Patient will perofrm SLS for 15 seconds on bil LE to improve safety with gait and stair mobility to improve safety around community when negotiating obstacles such as curbs.    Time  6    Period  Weeks    Status  On-going      PT LONG TERM GOAL #3   Title  Patient will improve DGI scor eby 4 points or more to indicate significant improvement in balance during ambulation to reduce fall risk.    Time  6    Period  Weeks    Status  On-going      PT LONG TERM GOAL #4   Title  Patient will report no increase in back pain over 2/10 for at least 1 full week to demosntrate improve activity tolerance and decreased pain with daily mobility/activities.    Time  6    Period  Weeks    Status  On-going            Plan - 01/14/18 1444    Clinical Impression Statement  Therapist added exercises per flowsheet.  PT needs multimodal cuing to achieve proper technique and then continues to need to be monitored as pt will deviate from the exercise he should be doing.  Therapist did not feel safe giving sit to stand to pt as HEP therefore this was modified to a bridge which is safer at this time for pt to perfom on his own.      Rehab Potential  Good    PT Frequency  2x / week    PT Duration  6 weeks    PT  Treatment/Interventions  ADLs/Self Care Home Management;Aquatic Therapy;Cryotherapy;Electrical Stimulation;Moist Heat;DME Instruction;Gait training;Stair training;Functional mobility training;Therapeutic activities;Therapeutic exercise;Balance training;Neuromuscular re-education;Patient/family education;Manual techniques;Passive range of motion    PT Next Visit Plan  Begin side stepping with or without t-band.  Work on form with current exercises.     PT Home  Exercise Plan  Eval: standing lumbar extension    Consulted and Agree with Plan of Care  Patient       Patient will benefit from skilled therapeutic intervention in order to improve the following deficits and impairments:  Abnormal gait, Decreased mobility, Postural dysfunction, Decreased activity tolerance, Decreased endurance, Decreased range of motion, Decreased strength, Difficulty walking, Decreased balance  Visit Diagnosis: Chronic low back pain without sciatica, unspecified back pain laterality  Muscle weakness (generalized)  Other abnormalities of gait and mobility     Problem List Patient Active Problem List   Diagnosis Date Noted  . S/P total knee replacement, left 06/09/10 01/03/2018  . Chest pain 09/24/2016  . AAA (abdominal aortic aneurysm) ([place]) 09/24/2016  . Cognitive dysfunction 12/27/2014  . Hyperlipidemia 12/27/2014  . Gout 01/11/2014  . HTN (hypertension) 01/11/2014  . Anemia, iron deficiency 04/03/2013  . Loss of weight 03/20/2013  . S/P total knee replacement, right 09/21/09 09/16/2010  . GASTROESOPHAGEAL REFLUX DISEASE 05/08/2010  . DIVERTICULOSIS, COLON 05/08/2010  . TOBACCO ABUSE 05/08/2010  . KNEE, ARTHRITIS, DEGEN./OSTEO 04/05/2008  Rayetta Humphrey, PT CLT [phone] 01/14/2018, 2:47 PM  [facility] [address] [place], [place], [address] Phone: [phone]   Fax:  [phone]  Name: JUD FANGUY MRN: [id number] Date of Birth: Jul 25, 1925

## 2018-01-14 NOTE — Telephone Encounter (Signed)
If the patient is getting significantly short of breath or other issues with activity such as chest tightness pressure pain then I would recommend a follow-up office visit if it is essentially sweating but his breathing is okay no chest tightness no pressure pain unlikely to be a sign of any major issue-but if ongoing troubles or worsening problems please notify us

## 2018-01-14 NOTE — Telephone Encounter (Signed)
Discussed with pt. Pt states he is not having any chest tightness, no pain, no pressure and no sob. States he is doing fine and will follow up if anything changes.

## 2018-01-17 ENCOUNTER — Ambulatory Visit (INDEPENDENT_AMBULATORY_CARE_PROVIDER_SITE_OTHER): Payer: Medicare HMO | Admitting: Family Medicine

## 2018-01-17 ENCOUNTER — Encounter: Payer: Self-pay | Admitting: Family Medicine

## 2018-01-17 VITALS — BP 124/76 | Ht 70.0 in | Wt 171.2 lb

## 2018-01-17 DIAGNOSIS — Z23 Encounter for immunization: Secondary | ICD-10-CM

## 2018-01-17 DIAGNOSIS — R079 Chest pain, unspecified: Secondary | ICD-10-CM | POA: Diagnosis not present

## 2018-01-17 NOTE — Progress Notes (Signed)
   Subjective:    Patient ID: [name], male    DOB: 03/18/1925, 84 y.o.   MRN: [id number]  HPIFollow up sciatica in right leg. Pt states he is much better.   Blister on left great toe.  Good Sweating when doing activity on one day last week. Pt states no other symptoms and has not done that since that day. Wife states he has complained of chest pain some.   Needs something for reflex since zantac got recalled.   Flu vaccine today.  He states his sciatica is doing better less leg pain He denies any chest pressure tightness or pain currently He states when he is active he gets a little short of breath but nothing out of character for his age He states he broke out in a sweat 1 day which was minimal has not done any other day   Review of Systems  Constitutional: Negative for activity change, appetite change and fatigue.  HENT: Negative for congestion and rhinorrhea.   Respiratory: Negative for cough and shortness of breath.   Cardiovascular: Negative for chest pain and leg swelling.  Gastrointestinal: Negative for abdominal pain, nausea and vomiting.  Neurological: Negative for dizziness and headaches.  Psychiatric/Behavioral: Negative for agitation and behavioral problems.       Objective:   Physical Exam  Constitutional: He appears well-nourished. No distress.  HENT:  Head: Normocephalic and atraumatic.  Eyes: Right eye exhibits no discharge. Left eye exhibits no discharge.  Neck: No tracheal deviation present.  Cardiovascular: Normal rate, regular rhythm and normal heart sounds.  No murmur heard. Pulmonary/Chest: Effort normal and breath sounds normal. No respiratory distress.  Musculoskeletal: He exhibits no edema.  Lymphadenopathy:    He has no cervical adenopathy.  Neurological: He is alert. Coordination normal.  Skin: Skin is warm and dry.  Psychiatric: He has a normal mood and affect. His behavior is normal.  Vitals reviewed.         Assessment &  Plan:  Sciatica-much improved continue current measures  Recent shortness of breath EKG looks good patient denies any DOE denies chest pressure tightness See discussion above EKG looks good compared to previous EKG in the electronic chart  If further problems or progression follow-up  Recheck in 4 months

## 2018-01-18 ENCOUNTER — Telehealth (HOSPITAL_COMMUNITY): Payer: Self-pay

## 2018-01-18 ENCOUNTER — Ambulatory Visit (HOSPITAL_COMMUNITY): Payer: Medicare HMO

## 2018-01-18 MED ORDER — FAMOTIDINE 20 MG PO TABS: 20.0000 mg | ORAL_TABLET | Freq: Two times a day (BID) | ORAL | 5 refills | Status: DC

## 2018-01-18 NOTE — Addendum Note (Signed)
Addended by: Sallee Lange A on: 01/18/2018 10:28 AM   Modules accepted: Orders

## 2018-01-18 NOTE — Telephone Encounter (Signed)
Wife is sick and he said he wanted to r/s then he changed his mind and did not want to r/s NF 01/18/18

## 2018-01-20 ENCOUNTER — Encounter: Payer: Self-pay | Admitting: Family Medicine

## 2018-01-20 ENCOUNTER — Encounter (HOSPITAL_COMMUNITY): Payer: Self-pay

## 2018-01-20 ENCOUNTER — Ambulatory Visit (HOSPITAL_COMMUNITY): Payer: Medicare HMO

## 2018-01-20 ENCOUNTER — Ambulatory Visit: Payer: Medicare HMO

## 2018-01-20 DIAGNOSIS — M545 Low back pain, unspecified: Secondary | ICD-10-CM

## 2018-01-20 DIAGNOSIS — R2689 Other abnormalities of gait and mobility: Secondary | ICD-10-CM | POA: Diagnosis not present

## 2018-01-20 DIAGNOSIS — G8929 Other chronic pain: Secondary | ICD-10-CM

## 2018-01-20 DIAGNOSIS — M6281 Muscle weakness (generalized): Secondary | ICD-10-CM | POA: Diagnosis not present

## 2018-01-20 NOTE — Therapy (Signed)
[place], [place], [address] Phone: [phone]   Fax:  [phone]  Physical Therapy Treatment  Patient Details  Name: [name] MRN: [id number] Date of Birth: [birth year] Referring Provider (PT): Carole Civil, MD   Encounter Date: 01/20/2018  PT End of Session - 01/20/18 1303    Visit Number  3    Number of Visits  13    Date for PT Re-Evaluation  02/23/18    Authorization Type  [facility] Medicare PPO (no visit limit, based on med necessity, $40 copay)    Authorization Time Period  01/12/18 - 02/25/18    Authorization - Visit Number  3    Authorization - Number of Visits  13    PT Start Time  1300    PT Stop Time  1343    PT Time Calculation (min)  43 min    Equipment Utilized During Treatment  Gait belt    Activity Tolerance  Patient tolerated treatment well    Behavior During Therapy  WFL for tasks assessed/performed       Past Medical History:  Diagnosis Date  . Asthma   . Diverticulosis   . GERD (gastroesophageal reflux disease)   . Gout   . Heart murmur    Evaluation by a cardiologist years ago was reportedly negative  . HTN (hypertension)   . Osteoarthritis   . Reflux     Past Surgical History:  Procedure Laterality Date  . APPENDECTOMY  2007  . COLONOSCOPY  2003  . COLONOSCOPY N/A 12/28/2012   Procedure: COLONOSCOPY;  Surgeon: Rogene Houston, MD;  [facility];  Service: Endoscopy;  Laterality: N/A;  255-rescheduled to 10:30 Ann notified pt  . EYE SURGERY Right    cataract removal  . INGUINAL HERNIA REPAIR     Right  . JOINT REPLACEMENT     right total knee RIGHT total knee arthroplasty  . TOTAL KNEE ARTHROPLASTY  09/2009   Right    There were no vitals filed for this visit.  Subjective Assessment - 01/20/18 1258    Subjective  Pt stated he is feeling a lot better, no reoprts of pain today.  Has been walking around the house without cane.      Patient  Stated Goals  be able to walk further    Currently in Pain?  No/denies                       [facility] Adult PT Treatment/Exercise - 01/20/18 0001      Exercises   Exercises  Lumbar      Lumbar Exercises: Stretches   Active Hamstring Stretch  3 reps;30 seconds    Active Hamstring Stretch Limitations  supine    Standing Extension  10 reps    Figure 4 Stretch  2 reps;30 seconds    Figure 4 Stretch Limitations  wiht towel assistance      Lumbar Exercises: Standing   Heel Raises  10 reps    Heel Raises Limitations  Toe raises incline slope    Functional Squats  10 reps    Functional Squats Limitations  cueing for mechanics infront of chair    Other Standing Lumbar Exercises  heel toe tandem 30" x 2 on foam; sidestep 2RT (RTB 2nd set)    Other Standing Lumbar Exercises  wall arch 10x 3"      Lumbar Exercises: Seated   Sit to Stand  Limitations  10    Other Seated Lumbar Exercises  sit as tall as possible hold x 5" x 10 ; scapular retraction x 10 (verbal and tactile cueing to reduce UT and tactile for proper mm activation)                PT Short Term Goals - 01/14/18 1447      PT SHORT TERM GOAL #1   Title  Patient will be independent with HEP, updated PRN, to improve LE strength and balance in order to improve safety and independance with mobility.    Time  2    Period  Weeks    Status  On-going      PT SHORT TERM GOAL #2   Title  Patient will perform 5x sit to stand in 14 seconds or less without UE assistance and no sign of LOB in standing to demonstrate improved balance tiwh transitional movements and improved functional LE strength.    Time  3    Period  Weeks    Status  On-going        PT Long Term Goals - 01/14/18 1447      PT LONG TERM GOAL #1   Title  Patient will improve gait velocity in 2MWT to 0.8 m/s or faster with LRAD and no overt LOB, to demonstrate decreased fall risk, and improve access to community by falling within community ambulator  category.     Time  6    Period  Weeks    Status  On-going      PT LONG TERM GOAL #2   Title  Patient will perofrm SLS for 15 seconds on bil LE to improve safety with gait and stair mobility to improve safety around community when negotiating obstacles such as curbs.    Time  6    Period  Weeks    Status  On-going      PT LONG TERM GOAL #3   Title  Patient will improve DGI scor eby 4 points or more to indicate significant improvement in balance during ambulation to reduce fall risk.    Time  6    Period  Weeks    Status  On-going      PT LONG TERM GOAL #4   Title  Patient will report no increase in back pain over 2/10 for at least 1 full week to demosntrate improve activity tolerance and decreased pain with daily mobility/activities.    Time  6    Period  Weeks    Status  On-going            Plan - 01/20/18 1331    Clinical Impression Statement  Session foucs on proximal strengthening, posture and balance activities for stability.  Pt continues to require multimodal cueing to acheive proper technqiues and use of proper musculature with exercises.  Added sidestep for glut med strengthening and SLS activities with min guard, able to add resistance 2RT.  No reports of increased pain through session.    Rehab Potential  Good    PT Frequency  2x / week    PT Duration  6 weeks    PT Treatment/Interventions  ADLs/Self Care Home Management;Aquatic Therapy;Cryotherapy;Electrical Stimulation;Moist Heat;DME Instruction;Gait training;Stair training;Functional mobility training;Therapeutic activities;Therapeutic exercise;Balance training;Neuromuscular re-education;Patient/family education;Manual techniques;Passive range of motion    PT Next Visit Plan  Add SLS next session and continue to work on form with current exercises.     PT Home Exercise Plan  Eval: standing lumbar extension  Patient will benefit from skilled therapeutic intervention in order to improve the following deficits  and impairments:  Abnormal gait, Decreased mobility, Postural dysfunction, Decreased activity tolerance, Decreased endurance, Decreased range of motion, Decreased strength, Difficulty walking, Decreased balance  Visit Diagnosis: Chronic low back pain without sciatica, unspecified back pain laterality  Muscle weakness (generalized)  Other abnormalities of gait and mobility     Problem List Patient Active Problem List   Diagnosis Date Noted  . S/P total knee replacement, left 06/09/10 01/03/2018  . Chest pain 09/24/2016  . AAA (abdominal aortic aneurysm) ([place]) 09/24/2016  . Cognitive dysfunction 12/27/2014  . Hyperlipidemia 12/27/2014  . Gout 01/11/2014  . HTN (hypertension) 01/11/2014  . Anemia, iron deficiency 04/03/2013  . Loss of weight 03/20/2013  . S/P total knee replacement, right 09/21/09 09/16/2010  . GASTROESOPHAGEAL REFLUX DISEASE 05/08/2010  . DIVERTICULOSIS, COLON 05/08/2010  . TOBACCO ABUSE 05/08/2010  . KNEE, ARTHRITIS, DEGEN./OSTEO 04/05/2008   Ihor Austin, LPTA; CBIS [phone]  Aldona Lento 01/20/2018, 1:45 PM  [facility] [address] [place], [place], [address] Phone: [phone]   Fax:  [phone]  Name: THIEN BERKA MRN: [id number] Date of Birth: 12-Jan-1926

## 2018-01-25 ENCOUNTER — Ambulatory Visit (HOSPITAL_COMMUNITY): Payer: Medicare HMO

## 2018-01-25 ENCOUNTER — Telehealth (HOSPITAL_COMMUNITY): Payer: Self-pay

## 2018-01-25 DIAGNOSIS — L602 Onychogryphosis: Secondary | ICD-10-CM | POA: Diagnosis not present

## 2018-01-25 DIAGNOSIS — I739 Peripheral vascular disease, unspecified: Secondary | ICD-10-CM | POA: Diagnosis not present

## 2018-01-27 ENCOUNTER — Other Ambulatory Visit: Payer: Self-pay

## 2018-01-27 ENCOUNTER — Ambulatory Visit (HOSPITAL_COMMUNITY): Payer: Medicare HMO

## 2018-01-27 ENCOUNTER — Encounter (HOSPITAL_COMMUNITY): Payer: Self-pay

## 2018-01-27 DIAGNOSIS — G8929 Other chronic pain: Secondary | ICD-10-CM

## 2018-01-27 DIAGNOSIS — M545 Low back pain, unspecified: Secondary | ICD-10-CM

## 2018-01-27 DIAGNOSIS — M6281 Muscle weakness (generalized): Secondary | ICD-10-CM

## 2018-01-27 DIAGNOSIS — R2689 Other abnormalities of gait and mobility: Secondary | ICD-10-CM | POA: Diagnosis not present

## 2018-01-27 NOTE — Therapy (Signed)
[place], [place], [address] Phone: [phone]   Fax:  [phone]  Physical Therapy Treatment  Patient Details  Name: [name] MRN: [id number] Date of Birth: [birth year] Referring Provider (PT): Carole Civil, MD   Encounter Date: 01/27/2018  PT End of Session - 01/27/18 1350    Visit Number  4    Number of Visits  13    Date for PT Re-Evaluation  02/23/18    Authorization Type  [facility] Medicare PPO (no visit limit, based on med necessity, $40 copay)    Authorization Time Period  01/12/18 - 02/25/18    Authorization - Visit Number  4    Authorization - Number of Visits  13    PT Start Time  6606    PT Stop Time  1426    PT Time Calculation (min)  41 min    Equipment Utilized During Treatment  Gait belt    Activity Tolerance  Patient tolerated treatment well    Behavior During Therapy  WFL for tasks assessed/performed       Past Medical History:  Diagnosis Date  . Asthma   . Diverticulosis   . GERD (gastroesophageal reflux disease)   . Gout   . Heart murmur    Evaluation by a cardiologist years ago was reportedly negative  . HTN (hypertension)   . Osteoarthritis   . Reflux     Past Surgical History:  Procedure Laterality Date  . APPENDECTOMY  2007  . COLONOSCOPY  2003  . COLONOSCOPY N/A 12/28/2012   Procedure: COLONOSCOPY;  Surgeon: Rogene Houston, MD;  [facility];  Service: Endoscopy;  Laterality: N/A;  255-rescheduled to 10:30 Ann notified pt  . EYE SURGERY Right    cataract removal  . INGUINAL HERNIA REPAIR     Right  . JOINT REPLACEMENT     right total knee RIGHT total knee arthroplasty  . TOTAL KNEE ARTHROPLASTY  09/2009   Right    There were no vitals filed for this visit.  Subjective Assessment - 01/27/18 1348    Subjective  Patient states he is doing well. He states his hip was a little sore from the exercises last time but that he is ok now. He  reports he has been walking around his house and yard without his cane all week.    Limitations  Sitting;House hold activities    How long can you sit comfortably?  sitting  too long aggravated back and Rt butt/hip    Patient Stated Goals  be able to walk further    Currently in Pain?  No/denies       [facility] Adult PT Treatment/Exercise - 01/27/18 0001      Exercises   Exercises  Lumbar      Lumbar Exercises: Stretches   Active Hamstring Stretch  Right;Left;3 reps;30 seconds    Active Hamstring Stretch Limitations  supine    Figure 4 Stretch  2 reps;30 seconds;Seated;With overpressure    Figure 4 Stretch Limitations  bil LE      Lumbar Exercises: Standing   Heel Raises  15 reps;3 seconds    Heel Raises Limitations  Toe raises incline slope; 15 reps, 3 seconds    Other Standing Lumbar Exercises  wall arch 15x 3"      Lumbar Exercises: Seated   Sit to Stand  10 reps    Other Seated Lumbar Exercises  Tall sit for glut set, 10  reps      Lumbar Exercises: Supine   Bridge  10 reps       Balance Exercises - 01/27/18 1435      Balance Exercises: Standing   Tandem Stance  Eyes open;4 reps;Intermittent upper extremity support;30 secs   alt foot alignment   SLS  Eyes open;Solid surface;5 reps;Intermittent upper extremity support;10 secs   5 reps bil LE   Partial Tandem Stance  Eyes open;Intermittent upper extremity support;2 reps   with 2lb dowel for bil UE overhead flexion, 10 reps each way   Step Over Hurdles / Cones  Latearl hurdle (6") step ove, 2 hurdles        PT Education - 01/27/18 1418    Education Details  Reviewed exercises and form this sesion. Educate don safety concern with not using SPC and needing to ambulate indoors/outdoors with cane to improve safety and decrease fall risk.     Person(s) Educated  Patient    Methods  Explanation;Verbal cues;Tactile cues    Comprehension  Verbalized understanding;Returned demonstration       PT Short Term Goals - 01/14/18  1447      PT SHORT TERM GOAL #1   Title  Patient will be independent with HEP, updated PRN, to improve LE strength and balance in order to improve safety and independance with mobility.    Time  2    Period  Weeks    Status  On-going      PT SHORT TERM GOAL #2   Title  Patient will perform 5x sit to stand in 14 seconds or less without UE assistance and no sign of LOB in standing to demonstrate improved balance tiwh transitional movements and improved functional LE strength.    Time  3    Period  Weeks    Status  On-going        PT Long Term Goals - 01/14/18 1447      PT LONG TERM GOAL #1   Title  Patient will improve gait velocity in 2MWT to 0.8 m/s or faster with LRAD and no overt LOB, to demonstrate decreased fall risk, and improve access to community by falling within community ambulator category.     Time  6    Period  Weeks    Status  On-going      PT LONG TERM GOAL #2   Title  Patient will perofrm SLS for 15 seconds on bil LE to improve safety with gait and stair mobility to improve safety around community when negotiating obstacles such as curbs.    Time  6    Period  Weeks    Status  On-going      PT LONG TERM GOAL #3   Title  Patient will improve DGI scor eby 4 points or more to indicate significant improvement in balance during ambulation to reduce fall risk.    Time  6    Period  Weeks    Status  On-going      PT LONG TERM GOAL #4   Title  Patient will report no increase in back pain over 2/10 for at least 1 full week to demosntrate improve activity tolerance and decreased pain with daily mobility/activities.    Time  6    Period  Weeks    Status  On-going         Plan - 01/27/18 1419    Clinical Impression Statement  Therapy session focused on functional strengthening to target hip muscles and  balance training. Patient progressed to modified tandem with overhead flexion and required minimal tactile cues to maintain midline posture with intermittent UE  support to prevent LOB. He advanced repetitions with exercises and denies pain or fatigue with this. Today patient reported he has been mobilizing indoors and outdoors without AD and therefore hurdle step over were initiated without UE support. He was educated on safety concerns with not using SPC to ambulate. LE stretches for hip were continued and patient reported that helps with his back and buttock pain. He will continue to benefit from skilled PT interventions to address current impairments and reduce fall risk and maintain independence with functional mobility.    Rehab Potential  Good    PT Frequency  2x / week    PT Duration  6 weeks    PT Treatment/Interventions  ADLs/Self Care Home Management;Aquatic Therapy;Cryotherapy;Electrical Stimulation;Moist Heat;DME Instruction;Gait training;Stair training;Functional mobility training;Therapeutic activities;Therapeutic exercise;Balance training;Neuromuscular re-education;Patient/family education;Manual techniques;Passive range of motion    PT Next Visit Plan  Continue with balance training in // bars and with LE strengthening. Progress as able.    PT Home Exercise Plan  Eval: standing lumbar extension; hamstring stretch, bridge, tandem at counter, piriformis stretch    Consulted and Agree with Plan of Care  Patient       Patient will benefit from skilled therapeutic intervention in order to improve the following deficits and impairments:  Abnormal gait, Decreased mobility, Postural dysfunction, Decreased activity tolerance, Decreased endurance, Decreased range of motion, Decreased strength, Difficulty walking, Decreased balance  Visit Diagnosis: Chronic low back pain without sciatica, unspecified back pain laterality  Muscle weakness (generalized)  Other abnormalities of gait and mobility     Problem List Patient Active Problem List   Diagnosis Date Noted  . S/P total knee replacement, left 06/09/10 01/03/2018  . Chest pain 09/24/2016  .  AAA (abdominal aortic aneurysm) ([place]) 09/24/2016  . Cognitive dysfunction 12/27/2014  . Hyperlipidemia 12/27/2014  . Gout 01/11/2014  . HTN (hypertension) 01/11/2014  . Anemia, iron deficiency 04/03/2013  . Loss of weight 03/20/2013  . S/P total knee replacement, right 09/21/09 09/16/2010  . GASTROESOPHAGEAL REFLUX DISEASE 05/08/2010  . DIVERTICULOSIS, COLON 05/08/2010  . TOBACCO ABUSE 05/08/2010  . KNEE, ARTHRITIS, DEGEN./OSTEO 04/05/2008    Kipp Brood, PT, DPT Physical Therapist with [facility]  01/27/2018 2:44 PM    [place], [place], [address] Phone: [phone]   Fax:  [phone]  Name: [name] MRN: [id number] Date of Birth: [birth year]

## 2018-02-01 ENCOUNTER — Encounter (HOSPITAL_COMMUNITY): Payer: Self-pay

## 2018-02-01 ENCOUNTER — Other Ambulatory Visit: Payer: Self-pay

## 2018-02-01 ENCOUNTER — Ambulatory Visit (HOSPITAL_COMMUNITY): Payer: Medicare HMO

## 2018-02-01 DIAGNOSIS — M6281 Muscle weakness (generalized): Secondary | ICD-10-CM | POA: Diagnosis not present

## 2018-02-01 DIAGNOSIS — M545 Low back pain, unspecified: Secondary | ICD-10-CM

## 2018-02-01 DIAGNOSIS — G8929 Other chronic pain: Secondary | ICD-10-CM

## 2018-02-01 DIAGNOSIS — R2689 Other abnormalities of gait and mobility: Secondary | ICD-10-CM | POA: Diagnosis not present

## 2018-02-01 NOTE — Therapy (Signed)
[place], [place], [address] Phone: [phone]   Fax:  [phone]  Physical Therapy Treatment  Patient Details  Name: [name] MRN: [id number] Date of Birth: 1925-09-09 Referring Provider (PT): Carole Civil, MD   Encounter Date: 02/01/2018  PT End of Session - 02/01/18 1315    Visit Number  5    Number of Visits  13    Date for PT Re-Evaluation  02/23/18    Authorization Type  [facility] Medicare PPO (no visit limit, based on med necessity, $40 copay)    Authorization Time Period  01/12/18 - 02/25/18    Authorization - Visit Number  5    Authorization - Number of Visits  13    PT Start Time  0086    PT Stop Time  1349    PT Time Calculation (min)  44 min    Equipment Utilized During Treatment  Gait belt    Activity Tolerance  Patient tolerated treatment well    Behavior During Therapy  WFL for tasks assessed/performed       Past Medical History:  Diagnosis Date  . Asthma   . Diverticulosis   . GERD (gastroesophageal reflux disease)   . Gout   . Heart murmur    Evaluation by a cardiologist years ago was reportedly negative  . HTN (hypertension)   . Osteoarthritis   . Reflux     Past Surgical History:  Procedure Laterality Date  . APPENDECTOMY  2007  . COLONOSCOPY  2003  . COLONOSCOPY N/A 12/28/2012   Procedure: COLONOSCOPY;  Surgeon: Rogene Houston, MD;  [facility];  Service: Endoscopy;  Laterality: N/A;  255-rescheduled to 10:30 Ann notified pt  . EYE SURGERY Right    cataract removal  . INGUINAL HERNIA REPAIR     Right  . JOINT REPLACEMENT     right total knee RIGHT total knee arthroplasty  . TOTAL KNEE ARTHROPLASTY  09/2009   Right    There were no vitals filed for this visit.  Subjective Assessment - 02/01/18 1308    Subjective  Patient reports he is feelign well now. He states his Rt hip has been sore since he cleaned his house this weekend but denies  pain now.    Limitations  Sitting;House hold activities    How long can you sit comfortably?  sitting  too long aggravated back and Rt butt/hip    Patient Stated Goals  be able to walk further    Currently in Pain?  No/denies       [facility] Adult PT Treatment/Exercise - 02/01/18 0001      Ambulation/Gait   Ambulation/Gait  Yes    Ambulation/Gait Assistance  6: Modified independent (Device/Increase time)    Ambulation Distance (Feet)  266 Feet    Assistive device  Straight cane    Gait Pattern  Step-through pattern;Decreased hip/knee flexion - right;Decreased hip/knee flexion - left;Decreased dorsiflexion - right;Decreased dorsiflexion - left;Decreased stride length;Narrow base of support;Poor foot clearance - left;Poor foot clearance - right;Scissoring    Ambulation Surface  Level;Indoor    Gait Comments  cues to look ahead to scan environment and to keep [place] with Lt LE, cues to put cane on ground for support and not to carry it above the ground      Exercises   Exercises  Lumbar      Lumbar Exercises: Stretches   Active Hamstring Stretch  Right;Left;3 reps;30 seconds  Active Hamstring Stretch Limitations  supine    Lower Trunk Rotation  Limitations    Lower Trunk Rotation Limitations  10 reps, 5 seconds    Standing Extension  10 reps;10 seconds    Standing Extension Limitations  cues to count out loud      Lumbar Exercises: Standing   Heel Raises  15 reps;3 seconds    Heel Raises Limitations  Seated toe raises, 15 reps      Lumbar Exercises: Seated   Sit to Stand  10 reps    Sit to Stand Limitations  2 sets        Balance Exercises - 02/01/18 1342      Balance Exercises: Standing   Tandem Stance  Eyes open;4 reps;Intermittent upper extremity support;30 secs   2x wiht bil UE flex (2#), 2x with paloff press (2#)   Rockerboard  Anterior/posterior;Other time (comment);Intermittent UE support   2x 1 minute to maintain midline   Step Over Hurdles / Cones  Lateral hurdle (6")  step over, 1 hurdle, 10 x RT        PT Education - 02/01/18 1344    Education Details  Educated on exercises throughout session and on importance of using SPC correctly to decrease risk of falling.    Person(s) Educated  Patient    Methods  Explanation;Verbal cues    Comprehension  Verbalized understanding;Returned demonstration       PT Short Term Goals - 01/14/18 1447      PT SHORT TERM GOAL #1   Title  Patient will be independent with HEP, updated PRN, to improve LE strength and balance in order to improve safety and independance with mobility.    Time  2    Period  Weeks    Status  On-going      PT SHORT TERM GOAL #2   Title  Patient will perform 5x sit to stand in 14 seconds or less without UE assistance and no sign of LOB in standing to demonstrate improved balance tiwh transitional movements and improved functional LE strength.    Time  3    Period  Weeks    Status  On-going        PT Long Term Goals - 01/14/18 1447      PT LONG TERM GOAL #1   Title  Patient will improve gait velocity in 2MWT to 0.8 m/s or faster with LRAD and no overt LOB, to demonstrate decreased fall risk, and improve access to community by falling within community ambulator category.     Time  6    Period  Weeks    Status  On-going      PT LONG TERM GOAL #2   Title  Patient will perofrm SLS for 15 seconds on bil LE to improve safety with gait and stair mobility to improve safety around community when negotiating obstacles such as curbs.    Time  6    Period  Weeks    Status  On-going      PT LONG TERM GOAL #3   Title  Patient will improve DGI scor eby 4 points or more to indicate significant improvement in balance during ambulation to reduce fall risk.    Time  6    Period  Weeks    Status  On-going      PT LONG TERM GOAL #4   Title  Patient will report no increase in back pain over 2/10 for at least 1 full week to  demosntrate improve activity tolerance and decreased pain with daily  mobility/activities.    Time  6    Period  Weeks    Status  On-going         Plan - 02/01/18 1331    Clinical Impression Statement  Therapy continues to focus on stretching to improve mobility of low back and balance training to reduce fall risk as patient is unsteady. He initiated LTR today and denied pain with stretch, and continued with standing lumbar extension stretch reporting relief in stiffness following exercise. He continued with tandem stance challenges and rockerboard was initiated as patient has tendency to LOB posteriorly. He required tactile/verbal cues to facilitate proper weight shift anteriorly on board to maintain midline. Ambulation with SPC was reviewed for safe use of SPC to prevent LOB and education provided on importance of using DME to reduce fall risk. He will continue to benefit from skilled PT interventions to address current impairments and reduce fall risk and maintain independence with functional mobility.    Rehab Potential  Good    PT Frequency  2x / week    PT Duration  6 weeks    PT Treatment/Interventions  ADLs/Self Care Home Management;Aquatic Therapy;Cryotherapy;Electrical Stimulation;Moist Heat;DME Instruction;Gait training;Stair training;Functional mobility training;Therapeutic activities;Therapeutic exercise;Balance training;Neuromuscular re-education;Patient/family education;Manual techniques;Passive range of motion    PT Next Visit Plan  Continue with balance training in // bars and with LE strengthening. Progress standing balance with dual task and step ups for strengthening. Progress as able.    PT Home Exercise Plan  Eval: standing lumbar extension; hamstring stretch, bridge, tandem at counter, piriformis stretch    Consulted and Agree with Plan of Care  Patient       Patient will benefit from skilled therapeutic intervention in order to improve the following deficits and impairments:  Abnormal gait, Decreased mobility, Postural dysfunction,  Decreased activity tolerance, Decreased endurance, Decreased range of motion, Decreased strength, Difficulty walking, Decreased balance  Visit Diagnosis: Chronic low back pain without sciatica, unspecified back pain laterality  Muscle weakness (generalized)  Other abnormalities of gait and mobility     Problem List Patient Active Problem List   Diagnosis Date Noted  . S/P total knee replacement, left 06/09/10 01/03/2018  . Chest pain 09/24/2016  . AAA (abdominal aortic aneurysm) ([place]) 09/24/2016  . Cognitive dysfunction 12/27/2014  . Hyperlipidemia 12/27/2014  . Gout 01/11/2014  . HTN (hypertension) 01/11/2014  . Anemia, iron deficiency 04/03/2013  . Loss of weight 03/20/2013  . S/P total knee replacement, right 09/21/09 09/16/2010  . GASTROESOPHAGEAL REFLUX DISEASE 05/08/2010  . DIVERTICULOSIS, COLON 05/08/2010  . TOBACCO ABUSE 05/08/2010  . KNEE, ARTHRITIS, DEGEN./OSTEO 04/05/2008    Kipp Brood, PT, DPT Physical Therapist with [facility]  02/01/2018 2:02 PM    [place], [place], [address] Phone: [phone]   Fax:  [phone]  Name: [name] MRN: [id number] Date of Birth: 08/17/1925

## 2018-02-03 ENCOUNTER — Other Ambulatory Visit: Payer: Self-pay | Admitting: Family Medicine

## 2018-02-03 ENCOUNTER — Other Ambulatory Visit: Payer: Self-pay

## 2018-02-03 ENCOUNTER — Ambulatory Visit (HOSPITAL_COMMUNITY): Payer: Medicare HMO

## 2018-02-03 ENCOUNTER — Encounter (HOSPITAL_COMMUNITY): Payer: Self-pay

## 2018-02-03 DIAGNOSIS — R2689 Other abnormalities of gait and mobility: Secondary | ICD-10-CM | POA: Diagnosis not present

## 2018-02-03 DIAGNOSIS — M545 Low back pain, unspecified: Secondary | ICD-10-CM

## 2018-02-03 DIAGNOSIS — G8929 Other chronic pain: Secondary | ICD-10-CM | POA: Diagnosis not present

## 2018-02-03 DIAGNOSIS — M6281 Muscle weakness (generalized): Secondary | ICD-10-CM | POA: Diagnosis not present

## 2018-02-03 NOTE — Therapy (Signed)
[facility] [address] [place], [place], [address] Phone: [phone]   Fax:  [phone]  Physical Therapy Treatment/Progress Note  Patient Details  Name: [name] MRN: [id number] Date of Birth: 07-25-1925 Referring Provider (PT): Troy Civil, MD   Encounter Date: 02/03/2018   Progress Note Reporting Period 01/12/2018 to 02/03/2018  See note below for Objective Data and Assessment of Progress/Goals.     PT End of Session - 02/03/18 1624    Visit Number  6    Number of Visits  13    Date for PT Re-Evaluation  02/23/18    Authorization Type  [facility] Medicare PPO (no visit limit, based on med necessity, $40 copay)    Authorization Time Period  01/12/18 - 02/25/18    Authorization - Visit Number  1    Authorization - Number of Visits  10    PT Start Time  1430    PT Stop Time  1510    PT Time Calculation (min)  40 min    Equipment Utilized During Treatment  Gait belt    Activity Tolerance  Patient tolerated treatment well    Behavior During Therapy  WFL for tasks assessed/performed       Past Medical History:  Diagnosis Date  . Asthma   . Diverticulosis   . GERD (gastroesophageal reflux disease)   . Gout   . Heart murmur    Evaluation by a cardiologist years ago was reportedly negative  . HTN (hypertension)   . Osteoarthritis   . Reflux     Past Surgical History:  Procedure Laterality Date  . APPENDECTOMY  2007  . COLONOSCOPY  2003  . COLONOSCOPY N/A 12/28/2012   Procedure: COLONOSCOPY;  Surgeon: Troy Houston, MD;  [facility];  Service: Endoscopy;  Laterality: N/A;  255-rescheduled to 10:30 Ann notified pt  . EYE SURGERY Right    cataract removal  . INGUINAL HERNIA REPAIR     Right  . JOINT REPLACEMENT     right total knee RIGHT total knee arthroplasty  . TOTAL KNEE ARTHROPLASTY  09/2009   Right    There were no vitals filed for this visit.  Subjective Assessment -  02/03/18 1436    Subjective  Patient arrives in pleasant mood. He reports his back really hurt this morning but after getting up and moving some he reports he loosened up and his pain went away. He does not have any now.    Limitations  Sitting;House hold activities    How long can you sit comfortably?  sitting  too long aggravated back and Rt butt/hip    Patient Stated Goals  be able to walk further    Currently in Pain?  No/denies       [facility] PT Assessment - 02/03/18 0001      Transfers   Five time sit to stand comments   14.7 without UE use      Static Standing Balance   Static Standing - Comment/# of Minutes  SLS: Rt = 5", Lt = 5"        [facility] Adult PT Treatment/Exercise - 02/03/18 0001      Exercises   Exercises  Lumbar      Lumbar Exercises: Standing   Heel Raises  15 reps;3 seconds    Row  Strengthening;Both;15 reps;Theraband    Theraband Level (Row)  Level 2 (Red)    Row Limitations  tactile cues for posture  Other Standing Lumbar Exercises  Forwrad Step Up: 1x 12 reps, bil LE, 4" step, bil UE support    Other Standing Lumbar Exercises  wall arch 15x 3"       Balance Exercises - 02/03/18 1620      Balance Exercises: Standing   Standing Eyes Opened  Narrow base of support (BOS);Foam/compliant surface;1 rep;30 secs    Standing Eyes Closed  Narrow base of support (BOS);Foam/compliant surface;1 rep;30 secs    SLS  Eyes open;Solid surface;3 reps;10 secs   3x bil LE   Rockerboard  Anterior/posterior;Other time (comment);Intermittent UE support   2x 1 minute   Partial Tandem Stance  Eyes open;Intermittent upper extremity support;2 reps;Foam/compliant surface   alt foot alignment, 2# dowel for UE flex       PT Education - 02/03/18 1623    Education Details  Educated on exercises throughout session and provided verbal/tactile cues as needed.    Person(s) Educated  Patient    Methods  Explanation    Comprehension  Verbalized understanding;Verbal cues  required;Tactile cues required       PT Short Term Goals - 02/03/18 1455      PT SHORT TERM GOAL #1   Title  Patient will be independent with HEP, updated PRN, to improve LE strength and balance in order to improve safety and independance with mobility.    Time  2    Period  Weeks    Status  On-going      PT SHORT TERM GOAL #2   Title  Patient will perform 5x sit to stand in 14 seconds or less without UE assistance and no sign of LOB in standing to demonstrate improved balance tiwh transitional movements and improved functional LE strength.    Baseline  14.7 without UE support    Time  3    Period  Weeks    Status  Achieved        PT Long Term Goals - 02/03/18 1456      PT LONG TERM GOAL #1   Title  Patient will improve gait velocity in 2MWT to 0.8 m/s or faster with LRAD and no overt LOB, to demonstrate decreased fall risk, and improve access to community by falling within community ambulator category.     Time  6    Period  Weeks    Status  On-going      PT LONG TERM GOAL #2   Title  Patient will perofrm SLS for 15 seconds on bil LE to improve safety with gait and stair mobility to improve safety around community when negotiating obstacles such as curbs.    Baseline  bil LE = 5 sec    Time  6    Period  Weeks    Status  On-going      PT LONG TERM GOAL #3   Title  Patient will improve DGI scor eby 4 points or more to indicate significant improvement in balance during ambulation to reduce fall risk.    Time  6    Period  Weeks    Status  On-going      PT LONG TERM GOAL #4   Title  Patient will report no increase in back pain over 2/10 for at least 1 full week to demosntrate improve activity tolerance and decreased pain with daily mobility/activities.    Time  6    Period  Weeks    Status  On-going        Plan -  02/03/18 1625    Clinical Impression Statement  Patient continues to progress well in therapy with balance challenges and LE strengthening. He was able to  progress step up training today with UE support to improve balance and for safety and he initiated postural strengthening with theraband in standing. He continues to have tendency to lean posteriorly with all exercises requiring moderate assist to prevent LOB intermittently. [name] continues to require verbal and tactile cues to correct posture during exercises and to use SPC safely. Objective measures for STG's were re-assessed today and he did meet his 5x sit to stand goal without the use of hi UE's. His balance in SLS has not improved since evaluation and he remains reliant on external support to steady himself. Overall he is progressing well and will continue to benefit from skilled PT interventions to address current impairments and reduce fall risk and maintain independence with functional mobility.    Rehab Potential  Good    PT Frequency  2x / week    PT Duration  6 weeks    PT Treatment/Interventions  ADLs/Self Care Home Management;Aquatic Therapy;Cryotherapy;Electrical Stimulation;Moist Heat;DME Instruction;Gait training;Stair training;Functional mobility training;Therapeutic activities;Therapeutic exercise;Balance training;Neuromuscular re-education;Patient/family education;Manual techniques;Passive range of motion    PT Next Visit Plan  Continue with balance training in // bars and with LE strengthening. Progress standing balance with dual task and step ups for strengthening. Perform postural theraband exercises to encourage upright posture as patient tends to flex forward.    PT Home Exercise Plan  Eval: standing lumbar extension; hamstring stretch, bridge, tandem at counter, piriformis stretch    Consulted and Agree with Plan of Care  Patient       Patient will benefit from skilled therapeutic intervention in order to improve the following deficits and impairments:  Abnormal gait, Decreased mobility, Postural dysfunction, Decreased activity tolerance, Decreased endurance, Decreased  range of motion, Decreased strength, Difficulty walking, Decreased balance  Visit Diagnosis: Chronic low back pain without sciatica, unspecified back pain laterality  Muscle weakness (generalized)  Other abnormalities of gait and mobility     Problem List Patient Active Problem List   Diagnosis Date Noted  . S/P total knee replacement, left 06/09/10 01/03/2018  . Chest pain 09/24/2016  . AAA (abdominal aortic aneurysm) ([place]) 09/24/2016  . Cognitive dysfunction 12/27/2014  . Hyperlipidemia 12/27/2014  . Gout 01/11/2014  . HTN (hypertension) 01/11/2014  . Anemia, iron deficiency 04/03/2013  . Loss of weight 03/20/2013  . S/P total knee replacement, right 09/21/09 09/16/2010  . GASTROESOPHAGEAL REFLUX DISEASE 05/08/2010  . DIVERTICULOSIS, COLON 05/08/2010  . TOBACCO ABUSE 05/08/2010  . KNEE, ARTHRITIS, DEGEN./OSTEO 04/05/2008     Kipp Brood, PT, DPT Physical Therapist with [facility]  02/03/2018 4:39 PM    [place], [place], [address] Phone: [phone]   Fax:  [phone]  Name: [name] MRN: [id number] Date of Birth: [birth year]

## 2018-02-04 ENCOUNTER — Other Ambulatory Visit: Payer: Self-pay | Admitting: Family Medicine

## 2018-02-04 MED ORDER — TRAMADOL HCL 50 MG PO TABS: ORAL_TABLET | ORAL | 1 refills | Status: DC

## 2018-02-07 ENCOUNTER — Ambulatory Visit (HOSPITAL_COMMUNITY): Payer: Medicare HMO

## 2018-02-07 ENCOUNTER — Telehealth (HOSPITAL_COMMUNITY): Payer: Self-pay

## 2018-02-07 NOTE — Telephone Encounter (Signed)
No show #1; called and spoke to patient's wife and the pt himself. He stated that he knew he had an appointment tomorrow but didn't know he had one for today. Reminded him of his appointment time tomorrow and they verbalized understanding.    Geraldine Solar PT, DPT

## 2018-02-08 ENCOUNTER — Ambulatory Visit (HOSPITAL_COMMUNITY): Payer: Medicare HMO

## 2018-02-08 ENCOUNTER — Encounter (HOSPITAL_COMMUNITY): Payer: Self-pay

## 2018-02-08 DIAGNOSIS — R2689 Other abnormalities of gait and mobility: Secondary | ICD-10-CM | POA: Diagnosis not present

## 2018-02-08 DIAGNOSIS — G8929 Other chronic pain: Secondary | ICD-10-CM | POA: Diagnosis not present

## 2018-02-08 DIAGNOSIS — M6281 Muscle weakness (generalized): Secondary | ICD-10-CM | POA: Diagnosis not present

## 2018-02-08 DIAGNOSIS — M545 Low back pain, unspecified: Secondary | ICD-10-CM

## 2018-02-08 NOTE — Therapy (Signed)
[facility] [place] [place], [place], [address] Phone: [phone]   Fax:  [phone]  Physical Therapy Treatment  Patient Details  Name: [name] MRN: [id number] Date of Birth: [birth year] Referring Provider (PT): Carole Civil, MD   Encounter Date: 02/08/2018  PT End of Session - 02/08/18 1301    Visit Number  7    Number of Visits  13    Date for PT Re-Evaluation  02/23/18    Authorization Type  [facility] Medicare PPO (no visit limit, based on med necessity, $40 copay)    Authorization Time Period  01/12/18 - 02/25/18    Authorization - Visit Number  2    Authorization - Number of Visits  10    PT Start Time  1300    PT Stop Time  1340    PT Time Calculation (min)  40 min    Equipment Utilized During Treatment  Gait belt    Activity Tolerance  Patient tolerated treatment well    Behavior During Therapy  WFL for tasks assessed/performed       Past Medical History:  Diagnosis Date  . Asthma   . Diverticulosis   . GERD (gastroesophageal reflux disease)   . Gout   . Heart murmur    Evaluation by a cardiologist years ago was reportedly negative  . HTN (hypertension)   . Osteoarthritis   . Reflux     Past Surgical History:  Procedure Laterality Date  . APPENDECTOMY  2007  . COLONOSCOPY  2003  . COLONOSCOPY N/A 12/28/2012   Procedure: COLONOSCOPY;  Surgeon: Rogene Houston, MD;  [facility];  Service: Endoscopy;  Laterality: N/A;  255-rescheduled to 10:30 Ann notified pt  . EYE SURGERY Right    cataract removal  . INGUINAL HERNIA REPAIR     Right  . JOINT REPLACEMENT     right total knee RIGHT total knee arthroplasty  . TOTAL KNEE ARTHROPLASTY  09/2009   Right    There were no vitals filed for this visit.  Subjective Assessment - 02/08/18 1300    Subjective  Pt stated he is feeling good today, no reports of pain.      Patient Stated Goals  be able to walk further    Currently in  Pain?  No/denies                       [facility] Adult PT Treatment/Exercise - 02/08/18 0001      Exercises   Exercises  Lumbar      Lumbar Exercises: Standing   Heel Raises  15 reps;3 seconds    Heel Raises Limitations  Seated toe raises, 15 reps    Row  Strengthening;Both;15 reps;Theraband    Theraband Level (Row)  Level 2 (Red)    Shoulder Extension  Both;15 reps;Theraband    Theraband Level (Shoulder Extension)  Level 2 (Red)    Other Standing Lumbar Exercises  Forwrad Step Up: 1x 15 reps, bil LE, 6" step, 1 UE support    Other Standing Lumbar Exercises  wall arch 15x 3"          Balance Exercises - 02/08/18 1317      Balance Exercises: Standing   Standing Eyes Closed  Narrow base of support (BOS);Foam/compliant surface;1 rep;30 secs    SLS  5 reps;Eyes open;Solid surface   Lt 15", Rt 6" max   SLS with Vectors  3 reps;Solid surface;Intermittent upper  extremity assist   3x 5" BLE 1 UE assistance, cueing for form and mechanics   Partial Tandem Stance  Eyes open;Intermittent upper extremity support;2 reps;Foam/compliant surface   on foam wiht 2# dowel rod   Sidestepping  2 reps;Theraband   RTB         PT Short Term Goals - 02/03/18 1455      PT SHORT TERM GOAL #1   Title  Patient will be independent with HEP, updated PRN, to improve LE strength and balance in order to improve safety and independance with mobility.    Time  2    Period  Weeks    Status  On-going      PT SHORT TERM GOAL #2   Title  Patient will perform 5x sit to stand in 14 seconds or less without UE assistance and no sign of LOB in standing to demonstrate improved balance tiwh transitional movements and improved functional LE strength.    Baseline  14.7 without UE support    Time  3    Period  Weeks    Status  Achieved        PT Long Term Goals - 02/03/18 1456      PT LONG TERM GOAL #1   Title  Patient will improve gait velocity in 2MWT to 0.8 m/s or faster with LRAD and no  overt LOB, to demonstrate decreased fall risk, and improve access to community by falling within community ambulator category.     Time  6    Period  Weeks    Status  On-going      PT LONG TERM GOAL #2   Title  Patient will perofrm SLS for 15 seconds on bil LE to improve safety with gait and stair mobility to improve safety around community when negotiating obstacles such as curbs.    Baseline  bil LE = 5 sec    Time  6    Period  Weeks    Status  On-going      PT LONG TERM GOAL #3   Title  Patient will improve DGI scor eby 4 points or more to indicate significant improvement in balance during ambulation to reduce fall risk.    Time  6    Period  Weeks    Status  On-going      PT LONG TERM GOAL #4   Title  Patient will report no increase in back pain over 2/10 for at least 1 full week to demosntrate improve activity tolerance and decreased pain with daily mobility/activities.    Time  6    Period  Weeks    Status  On-going            Plan - 02/08/18 1349    Clinical Impression Statement  Continued session focus with LE strengthening and balance training.  Able to increase height with step up training today with less UE assistance to address functional strengthening and balance.  Pt cotninues to require cueing to improve awareness of posture and to improve awarenss of surrounding wihtout looking at floor.  Added sidestep for glut med strengthening and vector stance to assist iwth SLS balalnce, moderate cueing for proper form with new activity.  Pt arrived without SPC, was educated on importance of safety with AD to assist with balance.      Rehab Potential  Good    PT Frequency  2x / week    PT Duration  6 weeks    PT Treatment/Interventions  ADLs/Self Care Home Management;Aquatic Therapy;Cryotherapy;Electrical Stimulation;Moist Heat;DME Instruction;Gait training;Stair training;Functional mobility training;Therapeutic activities;Therapeutic exercise;Balance training;Neuromuscular  re-education;Patient/family education;Manual techniques;Passive range of motion    PT Next Visit Plan  Continue with balance training in // bars and with LE strengthening. Progress standing balance with dual task and step ups for strengthening. Perform postural theraband exercises to encourage upright posture as patient tends to flex forward.    PT Home Exercise Plan  Eval: standing lumbar extension; hamstring stretch, bridge, tandem at counter, piriformis stretch       Patient will benefit from skilled therapeutic intervention in order to improve the following deficits and impairments:  Abnormal gait, Decreased mobility, Postural dysfunction, Decreased activity tolerance, Decreased endurance, Decreased range of motion, Decreased strength, Difficulty walking, Decreased balance  Visit Diagnosis: Chronic low back pain without sciatica, unspecified back pain laterality  Muscle weakness (generalized)  Other abnormalities of gait and mobility     Problem List Patient Active Problem List   Diagnosis Date Noted  . S/P total knee replacement, left 06/09/10 01/03/2018  . Chest pain 09/24/2016  . AAA (abdominal aortic aneurysm) ([place]) 09/24/2016  . Cognitive dysfunction 12/27/2014  . Hyperlipidemia 12/27/2014  . Gout 01/11/2014  . HTN (hypertension) 01/11/2014  . Anemia, iron deficiency 04/03/2013  . Loss of weight 03/20/2013  . S/P total knee replacement, right 09/21/09 09/16/2010  . GASTROESOPHAGEAL REFLUX DISEASE 05/08/2010  . DIVERTICULOSIS, COLON 05/08/2010  . TOBACCO ABUSE 05/08/2010  . KNEE, ARTHRITIS, DEGEN./OSTEO 04/05/2008   Ihor Austin, LPTA; CBIS [phone]  Aldona Lento 02/08/2018, 1:53 PM  [place], [place], [address] Phone: [phone]   Fax:  [phone]  Name: [name] MRN: [id number] Date of Birth: 01/06/1926

## 2018-02-14 ENCOUNTER — Ambulatory Visit (HOSPITAL_COMMUNITY): Payer: Medicare HMO | Attending: Orthopedic Surgery | Admitting: Physical Therapy

## 2018-02-14 ENCOUNTER — Telehealth (HOSPITAL_COMMUNITY): Payer: Self-pay | Admitting: Physical Therapy

## 2018-02-14 ENCOUNTER — Ambulatory Visit: Payer: Medicare HMO | Admitting: Orthopedic Surgery

## 2018-02-14 DIAGNOSIS — M6281 Muscle weakness (generalized): Secondary | ICD-10-CM | POA: Diagnosis not present

## 2018-02-14 DIAGNOSIS — G8929 Other chronic pain: Secondary | ICD-10-CM | POA: Insufficient documentation

## 2018-02-14 DIAGNOSIS — R2689 Other abnormalities of gait and mobility: Secondary | ICD-10-CM | POA: Diagnosis not present

## 2018-02-14 DIAGNOSIS — M545 Low back pain, unspecified: Secondary | ICD-10-CM

## 2018-02-14 NOTE — Therapy (Signed)
[facility] [place], [place], [address] Phone: [phone]   Fax:  [phone]  Physical Therapy Treatment  Patient Details  Name: [name] MRN: [id number] Date of Birth: 1925/09/22 Referring Provider (PT): Carole Civil, MD   Encounter Date: 02/14/2018  PT End of Session - 02/14/18 1117    Visit Number  8    Number of Visits  13    Date for PT Re-Evaluation  02/23/18    Authorization Type  [facility] Medicare PPO (no visit limit, based on med necessity, $40 copay)    Authorization Time Period  01/12/18 - 02/25/18    Authorization - Visit Number  3    Authorization - Number of Visits  10    PT Start Time  1030    PT Stop Time  1115    PT Time Calculation (min)  45 min    Equipment Utilized During Treatment  Gait belt    Activity Tolerance  Patient tolerated treatment well    Behavior During Therapy  [facility] for tasks assessed/performed       Past Medical History:  Diagnosis Date  . Asthma   . Diverticulosis   . GERD (gastroesophageal reflux disease)   . Gout   . Heart murmur    Evaluation by a cardiologist years ago was reportedly negative  . HTN (hypertension)   . Osteoarthritis   . Reflux     Past Surgical History:  Procedure Laterality Date  . APPENDECTOMY  2007  . COLONOSCOPY  2003  . COLONOSCOPY N/A 12/28/2012   Procedure: COLONOSCOPY;  Surgeon: Rogene Houston, MD;  [facility];  Service: Endoscopy;  Laterality: N/A;  255-rescheduled to 10:30 Ann notified pt  . EYE SURGERY Right    cataract removal  . INGUINAL HERNIA REPAIR     Right  . JOINT REPLACEMENT     right total knee RIGHT total knee arthroplasty  . TOTAL KNEE ARTHROPLASTY  09/2009   Right    There were no vitals filed for this visit.  Subjective Assessment - 02/14/18 1041    Subjective  PT got appt times confused, showing nearly 30 minutes late for appt.  Pt reports no pain or issues today.    Currently in Pain?   No/denies                       [facility] Adult PT Treatment/Exercise - 02/14/18 0001      Exercises   Exercises  Lumbar      Lumbar Exercises: Standing   Heel Raises  15 reps;3 seconds    Heel Raises Limitations  Seated toe raises, 15 reps    Row  Strengthening;Both;15 reps;Theraband    Theraband Level (Row)  Level 2 (Red)    Shoulder Extension  Both;15 reps;Theraband    Theraband Level (Shoulder Extension)  Level 2 (Red)    Other Standing Lumbar Exercises  Forwrad Step Up: 1x 15 reps, bil LE, 6" step, 1 UE support    Other Standing Lumbar Exercises  wall arch 15x 3"      Lumbar Exercises: Seated   Sit to Stand  10 reps    Sit to Stand Limitations  2 sets          Balance Exercises - 02/14/18 1058      Balance Exercises: Standing   Tandem Stance  Eyes open;4 reps;Intermittent upper extremity support;30 secs   2# bar UE flexion 10  reps, rotation 10 reps   SLS  5 reps;Eyes open;Solid surface    SLS with Vectors  5 reps;Upper extremity assist 1   5 seconds, vcs for posture   Sidestepping  2 reps;Theraband          PT Short Term Goals - 02/03/18 1455      PT SHORT TERM GOAL #1   Title  Patient will be independent with HEP, updated PRN, to improve LE strength and balance in order to improve safety and independance with mobility.    Time  2    Period  Weeks    Status  On-going      PT SHORT TERM GOAL #2   Title  Patient will perform 5x sit to stand in 14 seconds or less without UE assistance and no sign of LOB in standing to demonstrate improved balance tiwh transitional movements and improved functional LE strength.    Baseline  14.7 without UE support    Time  3    Period  Weeks    Status  Achieved        PT Long Term Goals - 02/03/18 1456      PT LONG TERM GOAL #1   Title  Patient will improve gait velocity in 2MWT to 0.8 m/s or faster with LRAD and no overt LOB, to demonstrate decreased fall risk, and improve access to community by falling  within community ambulator category.     Time  6    Period  Weeks    Status  On-going      PT LONG TERM GOAL #2   Title  Patient will perofrm SLS for 15 seconds on bil LE to improve safety with gait and stair mobility to improve safety around community when negotiating obstacles such as curbs.    Baseline  bil LE = 5 sec    Time  6    Period  Weeks    Status  On-going      PT LONG TERM GOAL #3   Title  Patient will improve DGI scor eby 4 points or more to indicate significant improvement in balance during ambulation to reduce fall risk.    Time  6    Period  Weeks    Status  On-going      PT LONG TERM GOAL #4   Title  Patient will report no increase in back pain over 2/10 for at least 1 full week to demosntrate improve activity tolerance and decreased pain with daily mobility/activities.    Time  6    Period  Weeks    Status  On-going            Plan - 02/14/18 1118    Clinical Impression Statement  continued wiht focus on LE strengthening and balance activities.  Increased challenge with semitandem using cane with rotation.  Pt required min-mod assist to complete this challenge.  Pt requires postural cues throughout session and focus on foward rather than down.  Pt able to complete all wtihout difficulty, 10 second max SLS time today on either LE.    Rehab Potential  Good    PT Frequency  2x / week    PT Duration  6 weeks    PT Treatment/Interventions  ADLs/Self Care Home Management;Aquatic Therapy;Cryotherapy;Electrical Stimulation;Moist Heat;DME Instruction;Gait training;Stair training;Functional mobility training;Therapeutic activities;Therapeutic exercise;Balance training;Neuromuscular re-education;Patient/family education;Manual techniques;Passive range of motion    PT Next Visit Plan  continue to progress stability and postural awareness.    PT Home  Exercise Plan  Eval: standing lumbar extension; hamstring stretch, bridge, tandem at counter, piriformis stretch        Patient will benefit from skilled therapeutic intervention in order to improve the following deficits and impairments:  Abnormal gait, Decreased mobility, Postural dysfunction, Decreased activity tolerance, Decreased endurance, Decreased range of motion, Decreased strength, Difficulty walking, Decreased balance  Visit Diagnosis: Chronic low back pain without sciatica, unspecified back pain laterality  Muscle weakness (generalized)  Other abnormalities of gait and mobility     Problem List Patient Active Problem List   Diagnosis Date Noted  . S/P total knee replacement, left 06/09/10 01/03/2018  . Chest pain 09/24/2016  . AAA (abdominal aortic aneurysm) ([place]) 09/24/2016  . Cognitive dysfunction 12/27/2014  . Hyperlipidemia 12/27/2014  . Gout 01/11/2014  . HTN (hypertension) 01/11/2014  . Anemia, iron deficiency 04/03/2013  . Loss of weight 03/20/2013  . S/P total knee replacement, right 09/21/09 09/16/2010  . GASTROESOPHAGEAL REFLUX DISEASE 05/08/2010  . DIVERTICULOSIS, COLON 05/08/2010  . TOBACCO ABUSE 05/08/2010  . KNEE, ARTHRITIS, DEGEN./OSTEO 04/05/2008   Teena Irani, PTA/CLT [phone]  Teena Irani 02/14/2018, 11:21 AM  [place], [place], [address] Phone: [phone]   Fax:  [phone]  Name: [name] MRN: [id number] Date of Birth: 1926/03/03

## 2018-02-14 NOTE — Telephone Encounter (Addendum)
PT did not show for appointment.  Called [facility] per contact notes and left message on vm regarding missed and next appointment.  Teena Irani, PTA/CLT [phone]  Pt showed 28 minutes late for appt and therapist  Was unaware of his arrival.  Able to work out a treatment session Teena Irani, PTA/CLT [phone]

## 2018-02-16 ENCOUNTER — Encounter: Payer: Self-pay | Admitting: Orthopedic Surgery

## 2018-02-16 ENCOUNTER — Ambulatory Visit (INDEPENDENT_AMBULATORY_CARE_PROVIDER_SITE_OTHER): Payer: Medicare HMO | Admitting: Orthopedic Surgery

## 2018-02-16 ENCOUNTER — Ambulatory Visit (INDEPENDENT_AMBULATORY_CARE_PROVIDER_SITE_OTHER): Payer: Medicare HMO

## 2018-02-16 VITALS — BP 150/82 | HR 74 | Ht 70.0 in | Wt 154.0 lb

## 2018-02-16 DIAGNOSIS — M5440 Lumbago with sciatica, unspecified side: Secondary | ICD-10-CM

## 2018-02-16 NOTE — Progress Notes (Signed)
Progress Note   Patient ID: [name], male   DOB: [birth year], 84 y.o.   MRN: [id number]   Chief Complaint  Patient presents with  . Back Pain    Last visit: 84 year old male status post bilateral total knees right knee 2011 left knee 2012 those are functioning well however  He has a new complaint of weakness in his lower extremities frequent falls right hip pain (which is in his lower back).  Pain is moderate to severe unrelieved by tramadol quality is a dull ache present for 1 month He says he has lost some weight over the last few months   This is a follow-up visit after patient was sent for physical therapy currently taking tramadol for pain: Therapy was partially completed few more visits her left.  Patient says he has better balance and strength and less pain in his lower back    Review of Systems  Constitutional: Negative for chills, fever, malaise/fatigue and weight loss.  Gastrointestinal: Negative for constipation.       Denies loss bowel control   Genitourinary:       Denies urinary retention or los of bladder control      Allergies  Allergen Reactions  . Penicillins Rash    Has patient had a PCN reaction causing immediate rash, facial/tongue/throat swelling, SOB or lightheadedness with hypotension: Yes Has patient had a PCN reaction causing severe rash involving mucus membranes or skin necrosis: No Has patient had a PCN reaction that required hospitalization: No Has patient had a PCN reaction occurring within the last 10 years: No If all of the above answers are "NO", then may proceed with Cephalosporin use.      BP (!) 150/82   Pulse 74   Ht 5\' 10"  (1.778 m)   Wt 154 lb (69.9 kg)   BMI 22.10 kg/m   Physical Exam  Constitutional: He is oriented to person, place, and time. He appears well-developed and well-nourished. No distress.  Neurological: He is alert and oriented to person, place, and time.  Skin: He is not diaphoretic.  Psychiatric: He  has a normal mood and affect. His behavior is normal.   Gait is supported by a cane  Mild tenderness in his lower back his range of motion has improved his leg strength is normal bilaterally  No numbness or tingling or sensory loss in either leg  No peripheral edema in either leg at this time  Medical decisions:   Data  Imaging:   [place]   Encounter Diagnosis  Name Primary?  . Low back pain with sciatica, sciatica laterality unspecified, unspecified back pain laterality, unspecified chronicity Yes    PLAN:  Patient is advised to continue his physical therapy until completion  Follow-up as needed exacerbation of symptoms    Arther Abbott, MD 02/16/2018 10:31 AM

## 2018-02-17 ENCOUNTER — Ambulatory Visit (HOSPITAL_COMMUNITY): Payer: Medicare HMO

## 2018-02-17 ENCOUNTER — Encounter (HOSPITAL_COMMUNITY): Payer: Self-pay

## 2018-02-17 DIAGNOSIS — M6281 Muscle weakness (generalized): Secondary | ICD-10-CM | POA: Diagnosis not present

## 2018-02-17 DIAGNOSIS — M545 Low back pain, unspecified: Secondary | ICD-10-CM

## 2018-02-17 DIAGNOSIS — G8929 Other chronic pain: Secondary | ICD-10-CM | POA: Diagnosis not present

## 2018-02-17 DIAGNOSIS — R2689 Other abnormalities of gait and mobility: Secondary | ICD-10-CM

## 2018-02-17 NOTE — Therapy (Signed)
[place], [place], [address] Phone: [phone]   Fax:  [phone]  Physical Therapy Treatment  Patient Details  Name: [name] MRN: [id number] Date of Birth: [birth year] Referring Provider (PT): Carole Civil, MD   Encounter Date: 02/17/2018  PT End of Session - 02/17/18 1304    Visit Number  9    Number of Visits  13    Date for PT Re-Evaluation  02/23/18    Authorization Type  [facility] Medicare PPO (no visit limit, based on med necessity, $40 copay)    Authorization Time Period  01/12/18 - 02/25/18    Authorization - Visit Number  4    Authorization - Number of Visits  10    PT Start Time  1300    PT Stop Time  1342    PT Time Calculation (min)  42 min    Equipment Utilized During Treatment  Gait belt    Activity Tolerance  Patient tolerated treatment well    Behavior During Therapy  WFL for tasks assessed/performed       Past Medical History:  Diagnosis Date  . Asthma   . Diverticulosis   . GERD (gastroesophageal reflux disease)   . Gout   . Heart murmur    Evaluation by a cardiologist years ago was reportedly negative  . HTN (hypertension)   . Osteoarthritis   . Reflux     Past Surgical History:  Procedure Laterality Date  . APPENDECTOMY  2007  . COLONOSCOPY  2003  . COLONOSCOPY N/A 12/28/2012   Procedure: COLONOSCOPY;  Surgeon: Rogene Houston, MD;  [facility];  Service: Endoscopy;  Laterality: N/A;  255-rescheduled to 10:30 Ann notified pt  . EYE SURGERY Right    cataract removal  . INGUINAL HERNIA REPAIR     Right  . JOINT REPLACEMENT     right total knee RIGHT total knee arthroplasty  . TOTAL KNEE ARTHROPLASTY  09/2009   Right    There were no vitals filed for this visit.  Subjective Assessment - 02/17/18 1303    Subjective  Pt stated he is feeling good today, no reports of pain currently.  Does have some pain in hips if sits for long periods of time.       Patient Stated Goals  be able to walk further    Currently in Pain?  No/denies                       [facility] Adult PT Treatment/Exercise - 02/17/18 0001      Exercises   Exercises  Lumbar      Lumbar Exercises: Stretches   Standing Extension  10 reps;10 seconds    Standing Extension Limitations  cues to count out loud      Lumbar Exercises: Standing   Heel Raises  15 reps;3 seconds    Heel Raises Limitations  Seated toe raises, 15 reps    Row  Strengthening;Both;15 reps;Theraband    Theraband Level (Row)  Level 2 (Red)    Shoulder Extension  Both;15 reps;Theraband    Theraband Level (Shoulder Extension)  Level 2 (Red)    Other Standing Lumbar Exercises  Forwrad Step Up: 1x 15 reps, bil LE, 6" step, 1 UE support    Other Standing Lumbar Exercises  wall arch 15x 3"      Lumbar Exercises: Seated   Sit to Stand  10 reps  Sit to Stand Limitations  2 sets               PT Short Term Goals - 02/03/18 1455      PT SHORT TERM GOAL #1   Title  Patient will be independent with HEP, updated PRN, to improve LE strength and balance in order to improve safety and independance with mobility.    Time  2    Period  Weeks    Status  On-going      PT SHORT TERM GOAL #2   Title  Patient will perform 5x sit to stand in 14 seconds or less without UE assistance and no sign of LOB in standing to demonstrate improved balance tiwh transitional movements and improved functional LE strength.    Baseline  14.7 without UE support    Time  3    Period  Weeks    Status  Achieved        PT Long Term Goals - 02/03/18 1456      PT LONG TERM GOAL #1   Title  Patient will improve gait velocity in 2MWT to 0.8 m/s or faster with LRAD and no overt LOB, to demonstrate decreased fall risk, and improve access to community by falling within community ambulator category.     Time  6    Period  Weeks    Status  On-going      PT LONG TERM GOAL #2   Title  Patient will perofrm SLS  for 15 seconds on bil LE to improve safety with gait and stair mobility to improve safety around community when negotiating obstacles such as curbs.    Baseline  bil LE = 5 sec    Time  6    Period  Weeks    Status  On-going      PT LONG TERM GOAL #3   Title  Patient will improve DGI scor eby 4 points or more to indicate significant improvement in balance during ambulation to reduce fall risk.    Time  6    Period  Weeks    Status  On-going      PT LONG TERM GOAL #4   Title  Patient will report no increase in back pain over 2/10 for at least 1 full week to demosntrate improve activity tolerance and decreased pain with daily mobility/activities.    Time  6    Period  Weeks    Status  On-going            Plan - 02/17/18 1445    Clinical Impression Statement  Continued session focus on LE strengthening, balance activities and posture awareness.  Pt requires cueing for postural awareness with eye focus to reduce stairing at floor and improve focus to assist wiht posture and balance.  Pt able to verbalize importance of posture to assist with balance.  Pt continue to be challanged with dynamic surface and activities in semitandem stance requiring min A or reaching outside BOS to regain LOB.      Rehab Potential  Good    PT Frequency  2x / week    PT Duration  6 weeks    PT Treatment/Interventions  ADLs/Self Care Home Management;Aquatic Therapy;Cryotherapy;Electrical Stimulation;Moist Heat;DME Instruction;Gait training;Stair training;Functional mobility training;Therapeutic activities;Therapeutic exercise;Balance training;Neuromuscular re-education;Patient/family education;Manual techniques;Passive range of motion    PT Next Visit Plan  continue to progress stability and postural awareness.    PT Home Exercise Plan  Eval: standing lumbar extension; hamstring stretch, bridge,  tandem at counter, piriformis stretch       Patient will benefit from skilled therapeutic intervention in order to  improve the following deficits and impairments:  Abnormal gait, Decreased mobility, Postural dysfunction, Decreased activity tolerance, Decreased endurance, Decreased range of motion, Decreased strength, Difficulty walking, Decreased balance  Visit Diagnosis: Chronic low back pain without sciatica, unspecified back pain laterality  Muscle weakness (generalized)  Other abnormalities of gait and mobility     Problem List Patient Active Problem List   Diagnosis Date Noted  . S/P total knee replacement, left 06/09/10 01/03/2018  . Chest pain 09/24/2016  . AAA (abdominal aortic aneurysm) ([place]) 09/24/2016  . Cognitive dysfunction 12/27/2014  . Hyperlipidemia 12/27/2014  . Gout 01/11/2014  . HTN (hypertension) 01/11/2014  . Anemia, iron deficiency 04/03/2013  . Loss of weight 03/20/2013  . S/P total knee replacement, right 09/21/09 09/16/2010  . GASTROESOPHAGEAL REFLUX DISEASE 05/08/2010  . DIVERTICULOSIS, COLON 05/08/2010  . TOBACCO ABUSE 05/08/2010  . KNEE, ARTHRITIS, DEGEN./OSTEO 04/05/2008   Ihor Austin, LPTA; [place]  Aldona Lento 02/17/2018, 2:50 PM  [place], [place], [address] Phone: [phone]   Fax:  [phone]  Name: [name] MRN: [id number] Date of Birth: 02-Jan-1926

## 2018-02-21 ENCOUNTER — Ambulatory Visit (HOSPITAL_COMMUNITY): Payer: Medicare HMO

## 2018-02-21 DIAGNOSIS — M545 Low back pain, unspecified: Secondary | ICD-10-CM

## 2018-02-21 DIAGNOSIS — G8929 Other chronic pain: Secondary | ICD-10-CM

## 2018-02-21 DIAGNOSIS — M6281 Muscle weakness (generalized): Secondary | ICD-10-CM

## 2018-02-21 DIAGNOSIS — R2689 Other abnormalities of gait and mobility: Secondary | ICD-10-CM

## 2018-02-21 NOTE — Therapy (Signed)
[place], [place], [address] Phone: [phone]   Fax:  [phone]  Physical Therapy Treatment  Patient Details  Name: [name] MRN: [id number] Date of Birth: [birth year] Referring Provider (PT): Carole Civil, MD   Encounter Date: 02/21/2018  PT End of Session - 02/21/18 1118    Visit Number  10    Number of Visits  13    Date for PT Re-Evaluation  02/23/18    Authorization Type  [facility] Medicare PPO (no visit limit, based on med necessity, $40 copay)    Authorization Time Period  01/12/18 - 02/25/18    Authorization - Visit Number  5    Authorization - Number of Visits  10    PT Start Time  1120    PT Stop Time  1200    PT Time Calculation (min)  40 min    Equipment Utilized During Treatment  Gait belt    Activity Tolerance  Patient tolerated treatment well    Behavior During Therapy  WFL for tasks assessed/performed       Past Medical History:  Diagnosis Date  . Asthma   . Diverticulosis   . GERD (gastroesophageal reflux disease)   . Gout   . Heart murmur    Evaluation by a cardiologist years ago was reportedly negative  . HTN (hypertension)   . Osteoarthritis   . Reflux     Past Surgical History:  Procedure Laterality Date  . APPENDECTOMY  2007  . COLONOSCOPY  2003  . COLONOSCOPY N/A 12/28/2012   Procedure: COLONOSCOPY;  Surgeon: Rogene Houston, MD;  [facility];  Service: Endoscopy;  Laterality: N/A;  255-rescheduled to 10:30 Ann notified pt  . EYE SURGERY Right    cataract removal  . INGUINAL HERNIA REPAIR     Right  . JOINT REPLACEMENT     right total knee RIGHT total knee arthroplasty  . TOTAL KNEE ARTHROPLASTY  09/2009   Right    There were no vitals filed for this visit.  Subjective Assessment - 02/21/18 1211    Subjective  Patient reports he has been sore all day sore far. He states his Rt hip and knee are aching and usually they start to feel  better as he moves more.     Limitations  Sitting;House hold activities    How long can you sit comfortably?  sitting  too long aggravated back and Rt butt/hip    Patient Stated Goals  be able to walk further    Currently in Pain?  Yes    Pain Score  2     Pain Location  Hip    Pain Orientation  Right;Lateral    Pain Descriptors / Indicators  Aching;Sore    Pain Type  Chronic pain    Pain Radiating Towards  dow towards knee    Pain Onset  More than a month ago    Pain Frequency  Intermittent    Aggravating Factors   usually worse in morning    Pain Relieving Factors  gets better with movement        [facility] Adult PT Treatment/Exercise - 02/21/18 0001      Exercises   Exercises  Lumbar      Lumbar Exercises: Standing   Heel Raises  15 reps;3 seconds    Heel Raises Limitations  --    Row  Strengthening;Both;15 reps;Theraband    Theraband Level (Row)  Level 2 (  Red)    Shoulder Extension  Both;15 reps;Theraband    Theraband Level (Shoulder Extension)  Level 2 (Red)    Other Standing Lumbar Exercises  Forward Step Up: 1x 15 reps, bil LE, 6" step, 1 UE support    Other Standing Lumbar Exercises  1x 10 reps bi lLE hip extension with Red TB around ankles      Lumbar Exercises: Seated   Sit to Stand  10 reps    Sit to Stand Limitations  2 sets        Balance Exercises - 02/21/18 1129      Balance Exercises: Standing   Standing Eyes Opened  Narrow base of support (BOS);Foam/compliant surface;Head turns;1 rep   10x vertical head turns   Tandem Stance  Eyes open;Intermittent upper extremity support;2 reps;Foam/compliant surface   with 10x horizontal head turns bil   Partial Tandem Stance  Eyes open;Intermittent upper extremity support;2 reps;Foam/compliant surface   10 reps bil, with 3# dowel for overhead flex       PT Education - 02/21/18 1205    Education Details  Educated on exercises this session and on plan to re-assess goals and strength next visit. Proved  verbal/tactiole cues for exercises to improve patient's form.    Person(s) Educated  Patient    Methods  Explanation    Comprehension  Verbalized understanding;Verbal cues required;Tactile cues required       PT Short Term Goals - 02/03/18 1455      PT SHORT TERM GOAL #1   Title  Patient will be independent with HEP, updated PRN, to improve LE strength and balance in order to improve safety and independance with mobility.    Time  2    Period  Weeks    Status  On-going      PT SHORT TERM GOAL #2   Title  Patient will perform 5x sit to stand in 14 seconds or less without UE assistance and no sign of LOB in standing to demonstrate improved balance tiwh transitional movements and improved functional LE strength.    Baseline  14.7 without UE support    Time  3    Period  Weeks    Status  Achieved        PT Long Term Goals - 02/03/18 1456      PT LONG TERM GOAL #1   Title  Patient will improve gait velocity in 2MWT to 0.8 m/s or faster with LRAD and no overt LOB, to demonstrate decreased fall risk, and improve access to community by falling within community ambulator category.     Time  6    Period  Weeks    Status  On-going      PT LONG TERM GOAL #2   Title  Patient will perofrm SLS for 15 seconds on bil LE to improve safety with gait and stair mobility to improve safety around community when negotiating obstacles such as curbs.    Baseline  bil LE = 5 sec    Time  6    Period  Weeks    Status  On-going      PT LONG TERM GOAL #3   Title  Patient will improve DGI scor eby 4 points or more to indicate significant improvement in balance during ambulation to reduce fall risk.    Time  6    Period  Weeks    Status  On-going      PT LONG TERM GOAL #4   Title  Patient will report no increase in back pain over 2/10 for at least 1 full week to demosntrate improve activity tolerance and decreased pain with daily mobility/activities.    Time  6    Period  Weeks    Status  On-going          Plan - 02/21/18 1207    Clinical Impression Statement  Therapy focused on LE strength, posture, and balance training. Patient continues to require cues for exercises to deter forward trunk flexion and posterior weight shift. He requires cues throughout to use bars for support to prevent LOB. Tandem/modified tandem exercise were continued with head turns as well and posture exercises. Patient requires verbal and tactile cues with posturala nd LE strengthening exercises to improve form. He will be ready for re-assessment next session to determine progress towards goals.    Rehab Potential  Good    PT Frequency  2x / week    PT Duration  6 weeks    PT Treatment/Interventions  ADLs/Self Care Home Management;Aquatic Therapy;Cryotherapy;Electrical Stimulation;Moist Heat;DME Instruction;Gait training;Stair training;Functional mobility training;Therapeutic activities;Therapeutic exercise;Balance training;Neuromuscular re-education;Patient/family education;Manual techniques;Passive range of motion    PT Next Visit Plan  re-assess next session. progress postural and balance exercises as able. update HEP.    PT Home Exercise Plan  Eval: standing lumbar extension; hamstring stretch, bridge, tandem at counter, piriformis stretch    Consulted and Agree with Plan of Care  Patient       Patient will benefit from skilled therapeutic intervention in order to improve the following deficits and impairments:  Abnormal gait, Decreased mobility, Postural dysfunction, Decreased activity tolerance, Decreased endurance, Decreased range of motion, Decreased strength, Difficulty walking, Decreased balance  Visit Diagnosis: Chronic low back pain without sciatica, unspecified back pain laterality  Muscle weakness (generalized)  Other abnormalities of gait and mobility     Problem List Patient Active Problem List   Diagnosis Date Noted  . S/P total knee replacement, left 06/09/10 01/03/2018  . Chest pain  09/24/2016  . AAA (abdominal aortic aneurysm) ([place]) 09/24/2016  . Cognitive dysfunction 12/27/2014  . Hyperlipidemia 12/27/2014  . Gout 01/11/2014  . HTN (hypertension) 01/11/2014  . Anemia, iron deficiency 04/03/2013  . Loss of weight 03/20/2013  . S/P total knee replacement, right 09/21/09 09/16/2010  . GASTROESOPHAGEAL REFLUX DISEASE 05/08/2010  . DIVERTICULOSIS, COLON 05/08/2010  . TOBACCO ABUSE 05/08/2010  . KNEE, ARTHRITIS, DEGEN./OSTEO 04/05/2008    Kipp Brood, PT, DPT Physical Therapist with [facility]  02/21/2018 12:20 PM    [place], [place], [address] Phone: [phone]   Fax:  [phone]  Name: [name] MRN: [id number] Date of Birth: Feb 01, 1926

## 2018-02-24 ENCOUNTER — Other Ambulatory Visit: Payer: Self-pay

## 2018-02-24 ENCOUNTER — Encounter (HOSPITAL_COMMUNITY): Payer: Self-pay

## 2018-02-24 ENCOUNTER — Ambulatory Visit (HOSPITAL_COMMUNITY): Payer: Medicare HMO

## 2018-02-24 DIAGNOSIS — M545 Low back pain, unspecified: Secondary | ICD-10-CM

## 2018-02-24 DIAGNOSIS — R2689 Other abnormalities of gait and mobility: Secondary | ICD-10-CM | POA: Diagnosis not present

## 2018-02-24 DIAGNOSIS — M6281 Muscle weakness (generalized): Secondary | ICD-10-CM

## 2018-02-24 DIAGNOSIS — G8929 Other chronic pain: Secondary | ICD-10-CM

## 2018-02-24 NOTE — Therapy (Signed)
[place], [place], [address] Phone: [phone]   Fax:  [phone]  Physical Therapy Treatment/Discharge Summary  Patient Details  Name: [name] MRN: [id number] Date of Birth: [birth year] Referring Provider (PT): Troy Civil, MD   Encounter Date: 02/24/2018  PHYSICAL THERAPY DISCHARGE SUMMARY  Visits from Start of Care: 11  Current functional level related to goals / functional outcomes: Re-assessment performed today and [name] has made good improvements in strength and endurance. He demonstrated significant improvement in hip strength bilaterally with MMT and improve functional strength with 5x sit to stand testing. His gait velocity improved significantly from 0.67 m/s to 1.01 m/s indicating improved ability to mobilize in community and reduced fall risk. He did not make significant improvements in DGI testing today and scored at 16/24 indicating he remains at risk for falling with high level dynamic activities, however he did improve static SLS demonstrating ability to hold balance for 15 seconds. [name] has demonstrated good compliance with HEP and has reported feeling able to continue strengthening and performing exercises on his own. He will be discharged following this session.    Remaining deficits: See below details   Education / Equipment: Educated on improvements since evaluation and goals met. Educated on importance of continued participation in exercise to maintain improvements. Reviewed HEP to reduce stiffness in the morning.  Plan: Patient agrees to discharge.  Patient goals were partially met. Patient is being discharged due to meeting the stated rehab goals.  ?????        PT End of Session - 02/24/18 1548    Visit Number  11    Number of Visits  13    Date for PT Re-Evaluation  02/23/18    Authorization Type  [name] Medicare PPO (no visit limit, based on med  necessity, $40 copay)    Authorization Time Period  01/12/18 - 02/25/18    Authorization - Visit Number  6    Authorization - Number of Visits  10    PT Start Time  1517   pt late   PT Stop Time  1432    PT Time Calculation (min)  39 min    Equipment Utilized During Treatment  Gait belt    Activity Tolerance  Patient tolerated treatment well    Behavior During Therapy  WFL for tasks assessed/performed       Past Medical History:  Diagnosis Date  . Asthma   . Diverticulosis   . GERD (gastroesophageal reflux disease)   . Gout   . Heart murmur    Evaluation by a cardiologist years ago was reportedly negative  . HTN (hypertension)   . Osteoarthritis   . Reflux     Past Surgical History:  Procedure Laterality Date  . APPENDECTOMY  2007  . COLONOSCOPY  2003  . COLONOSCOPY N/A 12/28/2012   Procedure: COLONOSCOPY;  Surgeon: Troy Houston, MD;  Location: [facility];  Service: Endoscopy;  Laterality: N/A;  255-rescheduled to 10:30 Ann notified pt  . EYE SURGERY Right    cataract removal  . INGUINAL HERNIA REPAIR     Right  . JOINT REPLACEMENT     right total knee RIGHT total knee arthroplasty  . TOTAL KNEE ARTHROPLASTY  09/2009   Right    There were no vitals filed for this visit.  Subjective Assessment - 02/24/18 1546    Subjective  Patient reports he is doing well and that his back  and hip are not hurting today. He states his knees have been aching more throughout the day. He denies pain at this time.    Limitations  Sitting;House hold activities    How long can you sit comfortably?  sitting  too long aggravated back and Rt butt/hip    Patient Stated Goals  be able to walk further    Currently in Pain?  No/denies         [name] PT Assessment - 02/24/18 0001      Assessment   Medical Diagnosis  Low Back Pain and Weakness    Referring Provider (PT)  Troy Civil, MD    Hand Dominance  Right    Prior Therapy  for total knee replacements several years ago       Precautions   Precautions  Fall      Restrictions   Weight Bearing Restrictions  No      [name] residence    Living Arrangements  Spouse/significant other    Available Help at Discharge  Family   5 children and grandchildren   Type of [place] entrance;Stairs to enter    Entrance Stairs-Number of Steps  6    Entrance Stairs-Rails  Can reach both    [place]  One level;Laundry or work area in Production assistant, radio - 2 wheels;Cane - single point;Shower seat;Grab bars - toilet;Grab bars - tub/shower   walk in shower   Additional Comments  patient uses SPC for mobility. He has grab bars down the hallway in his house and in the bathroom.      Prior Function   Level of Independence  Independent    Vocation  Retired    Leisure  mowing my yard, play with grandkids      Cognition   Overall Cognitive Status  Within Functional Limits for tasks assessed      Observation/Other Assessments   Focus on Therapeutic Outcomes (FOTO)   incorrect FOTO set up      Posture/Postural Control   Posture/Postural Control  Postural limitations    Postural Limitations  Rounded Shoulders;Forward head;Decreased lumbar lordosis      AROM   Lumbar Flexion  50    Lumbar Extension  16    Lumbar - Right Side Bend  20    Lumbar - Left Side Bend  21    Lumbar - Right Rotation  5    Lumbar - Left Rotation  5      Strength   Right Hip Flexion  4+/5    Right Hip Extension  4+/5   limited by ROM   Right Hip ABduction  4+/5    Left Hip Flexion  4+/5    Left Hip Extension  5/5    Left Hip ABduction  5/5    Right Knee Flexion  4/5    Right Knee Extension  5/5    Left Knee Flexion  4+/5    Left Knee Extension  5/5    Right Ankle Dorsiflexion  4+/5    Left Ankle Dorsiflexion  5/5      Palpation   Spinal mobility  hypomobile in lumbar spine with PA's    Palpation comment  tenderness along Rt glut max and glut medius at  greater trochanter insertion      Special Tests    Special Tests  Hip Special Tests  Hip Special Tests   Hip Scouring;Patrick (FABER) Test;Other      Saralyn Pilar (FABER) Test   Findings  Negative    Side  Right      Hip Scouring   Findings  Negative    Side  Right      other   Findings  Negative    Side  Right    Comments  FADDIR      Transfers   Five time sit to stand comments   13.97   18.8 seconds   Comments  patient with sway upon standing      Ambulation/Gait   Ambulation/Gait  Yes    Ambulation/Gait Assistance  6: Modified independent (Device/Increase time)    Ambulation Distance (Feet)  394 Feet   2MWT   Assistive device  Straight cane    Gait Pattern  Step-through pattern;Decreased hip/knee flexion - right;Decreased hip/knee flexion - left;Decreased dorsiflexion - right;Decreased dorsiflexion - left;Decreased stride length;Narrow base of support;Poor foot clearance - left;Poor foot clearance - right;Scissoring    Gait velocity  1.01 m/s    Stairs  Yes    Stairs Assistance  6: Modified independent (Device/Increase time)    Stair Management Technique  With cane    Number of Stairs  12    Height of Stairs  6      Balance   Balance Assessed  Yes      Static Standing Balance   Static Standing - Balance Support  No upper extremity supported    Static Standing - Level of Assistance  5: Stand by assistance    Static Standing Balance -  Activities   Single Leg Stance - Right Leg;Single Leg Stance - Left Leg;Tandam Stance - Right Leg;Tandam Stance - Left Leg;Romberg - Eyes Opened;Romberg - Eyes Closed    Static Standing - Comment/# of Minutes  SLS Rt = 15" SLS Lt = 15"      Standardized Balance Assessment   Standardized Balance Assessment  Dynamic Gait Index      Dynamic Gait Index   Level Surface  Mild Impairment    Change in Gait Speed  Mild Impairment    Gait with Horizontal Head Turns  Mild Impairment    Gait with Vertical Head Turns  Mild Impairment    Gait and  Pivot Turn  Mild Impairment    Step Over Obstacle  Mild Impairment    Step Around Obstacles  Mild Impairment    Steps  Mild Impairment    Total Score  16       [facility] Adult PT Treatment/Exercise - 02/24/18 0001      Lumbar Exercises: Stretches   Standing Extension  10 reps;10 seconds    Standing Extension Limitations  at counter       PT Education - 02/24/18 1557    Education Details  Educated on improvements since evaluation and goals met. Educated on importance of continued participation in exercise to maintain improvements. Reviewed HEP to reduce stiffness in the morning.    Person(s) Educated  Patient    Methods  Explanation    Comprehension  Verbalized understanding;Returned demonstration       PT Short Term Goals - 02/24/18 1558      PT SHORT TERM GOAL #1   Title  Patient will be independent with HEP, updated PRN, to improve LE strength and balance in order to improve safety and independance with mobility.    Time  2    Period  Weeks  Status  Achieved      PT SHORT TERM GOAL #2   Title  Patient will perform 5x sit to stand in 14 seconds or less without UE assistance and no sign of LOB in standing to demonstrate improved balance tiwh transitional movements and improved functional LE strength.    Baseline  13.97 without UE support    Time  3    Period  Weeks    Status  Achieved        PT Long Term Goals - 02/24/18 1558      PT LONG TERM GOAL #1   Title  Patient will improve gait velocity in 2MWT to 0.8 m/s or faster with LRAD and no overt LOB, to demonstrate decreased fall risk, and improve access to community by falling within community ambulator category.     Baseline  1.0 m/s    Time  6    Period  Weeks    Status  Achieved      PT LONG TERM GOAL #2   Title  Patient will perofrm SLS for 15 seconds on bil LE to improve safety with gait and stair mobility to improve safety around community when negotiating obstacles such as curbs.    Baseline  bil LE = 15 sec     Time  6    Period  Weeks    Status  Achieved      PT LONG TERM GOAL #3   Title  Patient will improve DGI scor eby 4 points or more to indicate significant improvement in balance during ambulation to reduce fall risk.    Baseline  16/24 (was 14/24)    Time  6    Period  Weeks    Status  Not Met      PT LONG TERM GOAL #4   Title  Patient will report no increase in back pain over 2/10 for at least 1 full week to demosntrate improve activity tolerance and decreased pain with daily mobility/activities.    Baseline  patient reports pain reaches 3/10 at times in morning but tends to improve throughout the day as he moves.    Time  6    Period  Weeks    Status  Not Met       Plan - 02/24/18 1549    Clinical Impression Statement  Re-assessment performed today and Mr. Polansky has made good improvements in strength and endurance. He demonstrated significant improvement in hip strength bilaterally with MMT and improve functional strength with 5x sit to stand testing. His gait velocity improved significantly from 0.67 m/s to 1.01 m/s indicating improved ability to mobilize in community and reduced fall risk. He did not make significant improvements in DGI testing today and scored at 16/24 indicating he remains at risk for falling with high level dynamic activities, however he did improve static SLS demonstrating ability to hold balance for 15 seconds. Mr. Cast has demonstrated good compliance with HEP and has reported feeling able to continue strengthening and performing exercises on his own. He will be discharged following this session.     Rehab Potential  Good    PT Frequency  2x / week    PT Duration  6 weeks    PT Treatment/Interventions  ADLs/Self Care Home Management;Aquatic Therapy;Cryotherapy;Electrical Stimulation;Moist Heat;DME Instruction;Gait training;Stair training;Functional mobility training;Therapeutic activities;Therapeutic exercise;Balance training;Neuromuscular  re-education;Patient/family education;Manual techniques;Passive range of motion    PT Next Visit Plan  Discharge this session to [facility]  Eval: standing lumbar extension; hamstring stretch, bridge, tandem at counter, piriformis stretch    Consulted and Agree with Plan of Care  Patient       Patient will benefit from skilled therapeutic intervention in order to improve the following deficits and impairments:  Abnormal gait, Decreased mobility, Postural dysfunction, Decreased activity tolerance, Decreased endurance, Decreased range of motion, Decreased strength, Difficulty walking, Decreased balance  Visit Diagnosis: Chronic low back pain without sciatica, unspecified back pain laterality  Muscle weakness (generalized)  Other abnormalities of gait and mobility     Problem List Patient Active Problem List   Diagnosis Date Noted  . S/P total knee replacement, left 06/09/10 01/03/2018  . Chest pain 09/24/2016  . AAA (abdominal aortic aneurysm) ([place]) 09/24/2016  . Cognitive dysfunction 12/27/2014  . Hyperlipidemia 12/27/2014  . Gout 01/11/2014  . HTN (hypertension) 01/11/2014  . Anemia, iron deficiency 04/03/2013  . Loss of weight 03/20/2013  . S/P total knee replacement, right 09/21/09 09/16/2010  . GASTROESOPHAGEAL REFLUX DISEASE 05/08/2010  . DIVERTICULOSIS, COLON 05/08/2010  . TOBACCO ABUSE 05/08/2010  . KNEE, ARTHRITIS, DEGEN./OSTEO 04/05/2008    Kipp Brood, PT, DPT Physical Therapist with [facility]  02/24/2018 4:00 PM    [place], [place], [address] Phone: [phone]   Fax:  [phone]  Name: [name] MRN: [id number] Date of Birth: 10-Jul-1925

## 2018-03-07 ENCOUNTER — Other Ambulatory Visit: Payer: Self-pay | Admitting: Family Medicine

## 2018-03-07 NOTE — Telephone Encounter (Signed)
May have 1 refill 

## 2018-03-24 ENCOUNTER — Other Ambulatory Visit: Payer: Self-pay | Admitting: Family Medicine

## 2018-03-24 NOTE — Telephone Encounter (Signed)
May have this and 2 refills

## 2018-03-30 ENCOUNTER — Telehealth: Payer: Self-pay | Admitting: Family Medicine

## 2018-03-30 NOTE — Telephone Encounter (Signed)
The form was completed thank you 

## 2018-03-30 NOTE — Telephone Encounter (Signed)
Please see

## 2018-03-30 NOTE — Telephone Encounter (Signed)
Patient dropped off form to be filled out in your box.

## 2018-03-31 NOTE — Telephone Encounter (Signed)
Form was mailed to patient as requested on 03/31/2018.

## 2018-04-04 ENCOUNTER — Ambulatory Visit (INDEPENDENT_AMBULATORY_CARE_PROVIDER_SITE_OTHER): Payer: Medicare HMO | Admitting: Otolaryngology

## 2018-04-21 ENCOUNTER — Encounter: Payer: Self-pay | Admitting: Family Medicine

## 2018-04-21 ENCOUNTER — Ambulatory Visit (INDEPENDENT_AMBULATORY_CARE_PROVIDER_SITE_OTHER): Payer: Medicare HMO | Admitting: Family Medicine

## 2018-04-21 VITALS — BP 132/90 | Ht 70.0 in | Wt 164.0 lb

## 2018-04-21 DIAGNOSIS — I10 Essential (primary) hypertension: Secondary | ICD-10-CM

## 2018-04-21 DIAGNOSIS — M79674 Pain in right toe(s): Secondary | ICD-10-CM | POA: Diagnosis not present

## 2018-04-21 DIAGNOSIS — G5603 Carpal tunnel syndrome, bilateral upper limbs: Secondary | ICD-10-CM | POA: Diagnosis not present

## 2018-04-21 DIAGNOSIS — M79675 Pain in left toe(s): Secondary | ICD-10-CM

## 2018-04-21 MED ORDER — TRAMADOL HCL 50 MG PO TABS: 50.0000 mg | ORAL_TABLET | Freq: Four times a day (QID) | ORAL | 2 refills | Status: DC | PRN

## 2018-04-21 NOTE — Progress Notes (Signed)
Subjective:    Patient ID: [name], male    DOB: September 07, 1925, 85 y.o.   MRN: [id number]  HPI  Patient states he is here today to follow up on his right leg sciatica.He states that is much better he is not having to use the cane as much.He is taking tramadol 50 mg Q 6 hours prn, they are causing constipation, he does not take most of the time due to the constipation. He also has been taking tylenol prn.  He now bilateral hands hurt and he says they are "Crooked". He relates that they feel numb at times painful at times wakes him up at night he often as she shakes his hands to get them to "come back patient does have osteoarthritis but is not had rheumatoid arthritis  He also having blisters on left great toe and the second on the right foot toe.  He relates he has intermittent blisters he would like to see podiatry.  He saw a podiatrist at [facility] and he says she told him there was nothing wrong with his toes. She cut his toenails at that time,but he states there is pus coming out of the blisters. He says he does not want to go to see her any more.  No more concerns today.  Review of Systems  Constitutional: Negative for activity change, fatigue and fever.  HENT: Negative for congestion and rhinorrhea.   Respiratory: Negative for cough and shortness of breath.   Cardiovascular: Negative for chest pain and leg swelling.  Gastrointestinal: Negative for abdominal pain, diarrhea and nausea.  Genitourinary: Negative for dysuria and hematuria.  Neurological: Negative for weakness and headaches.  Psychiatric/Behavioral: Negative for agitation and behavioral problems.       Objective:   Physical Exam Vitals signs reviewed.  Constitutional:      General: He is not in acute distress. HENT:     Head: Normocephalic and atraumatic.  Eyes:     General:        Right eye: No discharge.        Left eye: No discharge.  Neck:     Trachea: No tracheal deviation.    Cardiovascular:     Rate and Rhythm: Normal rate and regular rhythm.     Heart sounds: Normal heart sounds. No murmur.  Pulmonary:     Effort: Pulmonary effort is normal. No respiratory distress.     Breath sounds: Normal breath sounds.  Lymphadenopathy:     Cervical: No cervical adenopathy.  Skin:    General: Skin is warm and dry.  Neurological:     Mental Status: He is alert.     Coordination: Coordination normal.  Psychiatric:        Behavior: Behavior normal.     25 minutes was spent with the patient.  This statement verifies that 25 minutes was indeed spent with the patient.  More than 50% of this visit-total duration of the visit-was spent in counseling and coordination of care. The issues that the patient came in for today as reflected in the diagnosis (s) please refer to documentation for further details.       Assessment & Plan:  Leg sciatica uses tramadol sparingly new prescription sent in patient uses pain medicine responsibly patient was cautioned if it causes drowsiness not to drive medication with refills was sent in  Toenail pain and discomfort referral to podiatry patient prefers [place] or even because of driving limitations  Hypertension under decent control  Cholesterol  under good control continue current measures previous labs reviewed  Bilateral possible carpal tunnel positive Tinel's referral to neurology for nerve conduction studies   Follow-up in approximately 4 to 5 months.

## 2018-04-22 NOTE — Addendum Note (Signed)
Addended by: Carmelina Noun on: 04/22/2018 08:28 AM   Modules accepted: Orders

## 2018-05-02 ENCOUNTER — Encounter: Payer: Self-pay | Admitting: Family Medicine

## 2018-05-05 ENCOUNTER — Other Ambulatory Visit: Payer: Self-pay | Admitting: Family Medicine

## 2018-05-13 ENCOUNTER — Telehealth: Payer: Self-pay | Admitting: Family Medicine

## 2018-05-13 ENCOUNTER — Encounter: Payer: Self-pay | Admitting: Family Medicine

## 2018-05-13 NOTE — Telephone Encounter (Signed)
[name] would like to know status of referral process to foot center.

## 2018-05-18 NOTE — Telephone Encounter (Signed)
Appt scheduled, pt aware - mailed letter to notify pt due to voicemail being full

## 2018-05-19 ENCOUNTER — Other Ambulatory Visit: Payer: Self-pay | Admitting: Family Medicine

## 2018-05-25 ENCOUNTER — Other Ambulatory Visit: Payer: Self-pay | Admitting: Family Medicine

## 2018-07-05 ENCOUNTER — Other Ambulatory Visit: Payer: Self-pay

## 2018-07-05 ENCOUNTER — Ambulatory Visit (INDEPENDENT_AMBULATORY_CARE_PROVIDER_SITE_OTHER): Payer: Medicare HMO | Admitting: Family Medicine

## 2018-07-05 DIAGNOSIS — I714 Abdominal aortic aneurysm, without rupture, unspecified: Secondary | ICD-10-CM

## 2018-07-05 DIAGNOSIS — E7849 Other hyperlipidemia: Secondary | ICD-10-CM

## 2018-07-05 DIAGNOSIS — I10 Essential (primary) hypertension: Secondary | ICD-10-CM | POA: Diagnosis not present

## 2018-07-05 NOTE — Progress Notes (Signed)
   Subjective:  Phone visit only Video not available Coronavirus outbreak   Patient ID: [name], male    DOB: 08/17/1925, 85 y.o.   MRN: [id number]  Hyperlipidemia  This is a chronic problem. The current episode started more than 1 year ago. There are no compliance problems.   takes crestor 20mg . Takes med every day.  Previous labs reviewed new labs ordered but do not do new labs until later in the summer Patient is trying to eat healthy eating we did discuss coronavirus protection We also discussed the importance of Tylenol for his arthritis and avoid anti-inflammatories. He states he is not using any diuretics currently overall he feels he is doing fairly well Pt states no concerns today.   Virtual Visit via Telephone Note  I connected with [name] on 07/05/18 at 10:00 AM EDT by telephone and verified that I am speaking with the correct person using two identifiers.   I discussed the limitations, risks, security and privacy concerns of performing an evaluation and management service by telephone and the availability of in person appointments. I also discussed with the patient that there may be a patient responsible charge related to this service. The patient expressed understanding and agreed to proceed.   History of Present Illness:    Observations/Objective:   Assessment and Plan:   Follow Up Instructions:    I discussed the assessment and treatment plan with the patient. The patient was provided an opportunity to ask questions and all were answered. The patient agreed with the plan and demonstrated an understanding of the instructions.   The patient was advised to call back or seek an in-person evaluation if the symptoms worsen or if the condition fails to improve as anticipated.  I provided 15 minutes of non-face-to-face time during this encounter.     Review of Systems     Objective:   Physical Exam        Assessment & Plan:   Hyperlipidemia previous labs reviewed new labs ordered continue medication  Arthritis Tylenol for this denies any problem with it  History of aortic aneurysm will just monitor at this point  Lab work later in the summer follow-up later in the summer

## 2018-08-09 ENCOUNTER — Ambulatory Visit (INDEPENDENT_AMBULATORY_CARE_PROVIDER_SITE_OTHER): Payer: Medicare HMO | Admitting: Family Medicine

## 2018-08-09 ENCOUNTER — Other Ambulatory Visit: Payer: Self-pay

## 2018-08-09 VITALS — BP 132/86 | Temp 97.4°F | Wt 169.8 lb

## 2018-08-09 DIAGNOSIS — M7989 Other specified soft tissue disorders: Secondary | ICD-10-CM

## 2018-08-09 MED ORDER — TORSEMIDE 20 MG PO TABS: ORAL_TABLET | ORAL | 5 refills | Status: DC

## 2018-08-09 NOTE — Progress Notes (Signed)
   Subjective:    Patient ID: [name], male    DOB: Mar 10, 1926, 85 y.o.   MRN: [id number]  HPIBilateral foot swelling. Started  3 days ago.  Patient denies any chest tightness pressure pain does relate no shortness of breath.  He relates just bilateral foot swelling.  States he tries to eat relatively healthy takes his medicines on a regular basis denies any PND orthopnea denies fatigue tiredness fever chills cough PMH benign   Review of Systems  Constitutional: Negative for activity change.  HENT: Negative for congestion and rhinorrhea.   Respiratory: Negative for cough and shortness of breath.   Cardiovascular: Negative for chest pain.  Gastrointestinal: Negative for abdominal pain, diarrhea, nausea and vomiting.  Genitourinary: Negative for dysuria and hematuria.  Neurological: Negative for weakness and headaches.  Psychiatric/Behavioral: Negative for behavioral problems and confusion.       Objective:   Physical Exam Vitals signs reviewed.  Constitutional:      General: He is not in acute distress. HENT:     Head: Normocephalic and atraumatic.  Eyes:     General:        Right eye: No discharge.        Left eye: No discharge.  Neck:     Trachea: No tracheal deviation.  Cardiovascular:     Rate and Rhythm: Normal rate and regular rhythm.     Heart sounds: Normal heart sounds. No murmur.  Pulmonary:     Effort: Pulmonary effort is normal. No respiratory distress.     Breath sounds: Normal breath sounds.  Lymphadenopathy:     Cervical: No cervical adenopathy.  Skin:    General: Skin is warm and dry.  Neurological:     Mental Status: He is alert.     Coordination: Coordination normal.  Psychiatric:        Behavior: Behavior normal.     Significant pedal edema noted in both legs goes up about 4 inches above the ankle on both sides. Lungs are completely clear no sign of CHF He will take increased torsemide 2 daily for Tuesday Wednesday Thursday then go back  to 1 daily at that point I do not feel this is a blood clot    Assessment & Plan:  Pedal edema Adjust diuretic Labs ordered Follow-up in a few weeks sooner if any problems Warning signs discussed in detail If patient starts to have shortness of breath PND orthopnea needs to notify us immediately

## 2018-08-10 LAB — BASIC METABOLIC PANEL
BUN/Creatinine Ratio: 10 (ref 10–24)
BUN: 12 mg/dL (ref 10–36)
CO2: 21 mmol/L (ref 20–29)
Calcium: 9.6 mg/dL (ref 8.6–10.2)
Chloride: 101 mmol/L (ref 96–106)
Creatinine, Ser: 1.21 mg/dL (ref 0.76–1.27)
GFR calc Af Amer: 61 mL/min/{1.73_m2} (ref >59)
GFR calc non Af Amer: 52 mL/min/{1.73_m2} — ABNORMAL LOW (ref >59)
Glucose: 92 mg/dL (ref 65–99)
Potassium: 3.9 mmol/L (ref 3.5–5.2)
Sodium: 142 mmol/L (ref 134–144)

## 2018-08-10 LAB — HEPATIC FUNCTION PANEL
ALT: 14 IU/L (ref 0–44)
AST: 23 IU/L (ref 0–40)
Albumin: 4.4 g/dL (ref 3.5–4.6)
Alkaline Phosphatase: 63 IU/L (ref 39–117)
Bilirubin Total: 1.9 mg/dL — ABNORMAL HIGH (ref 0.0–1.2)
Bilirubin, Direct: 0.36 mg/dL (ref 0.00–0.40)
Total Protein: 7 g/dL (ref 6.0–8.5)

## 2018-08-11 ENCOUNTER — Encounter: Payer: Self-pay | Admitting: Family Medicine

## 2018-08-12 DIAGNOSIS — L602 Onychogryphosis: Secondary | ICD-10-CM | POA: Diagnosis not present

## 2018-08-12 DIAGNOSIS — I70203 Unspecified atherosclerosis of native arteries of extremities, bilateral legs: Secondary | ICD-10-CM | POA: Diagnosis not present

## 2018-08-12 DIAGNOSIS — L84 Corns and callosities: Secondary | ICD-10-CM | POA: Diagnosis not present

## 2018-09-06 ENCOUNTER — Other Ambulatory Visit: Payer: Self-pay | Admitting: Family Medicine

## 2018-09-19 ENCOUNTER — Other Ambulatory Visit: Payer: Self-pay

## 2018-09-20 ENCOUNTER — Other Ambulatory Visit: Payer: Self-pay

## 2018-09-20 ENCOUNTER — Ambulatory Visit (INDEPENDENT_AMBULATORY_CARE_PROVIDER_SITE_OTHER): Payer: Medicare HMO | Admitting: Family Medicine

## 2018-09-20 DIAGNOSIS — M542 Cervicalgia: Secondary | ICD-10-CM

## 2018-09-20 NOTE — Progress Notes (Signed)
   Subjective:    Patient ID: [name], male    DOB: Jul 10, 1925, 85 y.o.   MRN: [id number] Telephone visit Shoulder Pain  The pain is present in the back, left shoulder, right shoulder and neck. This is a new problem. The current episode started in the past 7 days (pt was hanging pictures up. pt was looking up for a good amount of time and that is when the pain started). He has tried oral narcotics for the symptoms. The treatment provided no relief.  Patient states he is taking tramadol some for the pain he states the pain is mainly in the back of the neck into the right shoulder does not radiate down the arm he thinks he pulled a muscle when he was doing some activity recently PMH benign Virtual Visit via Video Note  I connected with [name] on 09/20/18 at  9:30 AM EDT by a video enabled telemedicine application and verified that I am speaking with the correct person using two identifiers.  Location: Patient: home Provider: office   I discussed the limitations of evaluation and management by telemedicine and the availability of in person appointments. The patient expressed understanding and agreed to proceed.  History of Present Illness:    Observations/Objective:   Assessment and Plan:   Follow Up Instructions:    I discussed the assessment and treatment plan with the patient. The patient was provided an opportunity to ask questions and all were answered. The patient agreed with the plan and demonstrated an understanding of the instructions.   The patient was advised to call back or seek an in-person evaluation if the symptoms worsen or if the condition fails to improve as anticipated.  I provided 15 minutes of non-face-to-face time during this encounter.   Vicente Males, LPN    Review of Systems  Constitutional: Negative for activity change.  HENT: Negative for congestion and rhinorrhea.   Respiratory: Negative for cough and shortness of breath.    Cardiovascular: Negative for chest pain.  Gastrointestinal: Negative for abdominal pain, diarrhea, nausea and vomiting.  Genitourinary: Negative for dysuria and hematuria.  Neurological: Negative for weakness and headaches.  Psychiatric/Behavioral: Negative for behavioral problems and confusion.       Objective:   Physical Exam Today's visit was via telephone Physical exam was not possible for this visit   No radicular symptoms no need for x-rays currently     Assessment & Plan:  Neck strain should gradually get better recommend gentle range of motion exercises along with cool compresses may use tramadol when pain is severe otherwise use Tylenol follow-up if progressive troubles or worse

## 2018-10-08 ENCOUNTER — Other Ambulatory Visit: Payer: Self-pay | Admitting: Family Medicine

## 2018-10-19 ENCOUNTER — Other Ambulatory Visit: Payer: Self-pay | Admitting: Family Medicine

## 2018-10-20 ENCOUNTER — Other Ambulatory Visit: Payer: Self-pay | Admitting: Family Medicine

## 2018-10-20 ENCOUNTER — Ambulatory Visit (INDEPENDENT_AMBULATORY_CARE_PROVIDER_SITE_OTHER): Payer: Medicare HMO | Admitting: Family Medicine

## 2018-10-20 ENCOUNTER — Encounter: Payer: Self-pay | Admitting: Family Medicine

## 2018-10-20 ENCOUNTER — Other Ambulatory Visit: Payer: Self-pay

## 2018-10-20 VITALS — Temp 97.3°F

## 2018-10-20 DIAGNOSIS — M778 Other enthesopathies, not elsewhere classified: Secondary | ICD-10-CM

## 2018-10-20 MED ORDER — PREDNISONE 20 MG PO TABS: ORAL_TABLET | ORAL | 0 refills | Status: DC

## 2018-10-20 NOTE — Progress Notes (Signed)
   Subjective:    Patient ID: [name], male    DOB: 1926-01-15, 85 y.o.   MRN: [id number]  HPI Pt was shucking corn yesterday and now is right hand from wrist down is swollen. Pt was hitting the corn to knock the knobs off.  Patient relates a lot of pain in hand the wrist area with swelling.  Difficult time moving it.  Denies any injury but did use a lot of activity with it has not tried anything for it yet Review of Systems Tests, no tests no x-rays at this point denies any major injury.  No elbow pain shoulder pain chest pain shortness of breath fever chills sweats    Objective:   Physical Exam  Lungs clear shoulder normal elbow normal Wrist mildly inflamed but no sign of any broken bone      Assessment & Plan:  Overuse injury with tendinitis prednisone taper should gradually get better warnings discussed

## 2018-11-07 ENCOUNTER — Other Ambulatory Visit: Payer: Self-pay | Admitting: Family Medicine

## 2018-11-11 DIAGNOSIS — L602 Onychogryphosis: Secondary | ICD-10-CM | POA: Diagnosis not present

## 2018-11-11 DIAGNOSIS — L84 Corns and callosities: Secondary | ICD-10-CM | POA: Diagnosis not present

## 2018-11-11 DIAGNOSIS — I70203 Unspecified atherosclerosis of native arteries of extremities, bilateral legs: Secondary | ICD-10-CM | POA: Diagnosis not present

## 2018-12-07 ENCOUNTER — Other Ambulatory Visit: Payer: Self-pay | Admitting: Family Medicine

## 2018-12-13 ENCOUNTER — Telehealth: Payer: Self-pay | Admitting: Family Medicine

## 2018-12-13 NOTE — Telephone Encounter (Signed)
Patient has had diarrhea all week no other symptoms. He would like something call into [facility] please deliver

## 2018-12-13 NOTE — Telephone Encounter (Signed)
Having diarrhea 3 -4 times a day for the past 3 -4 days, no abdominal pain, no fever. Tried pepto bismol.

## 2018-12-13 NOTE — Telephone Encounter (Signed)
Nurses Imodium may be used It is over-the-counter May take 1 tablet every 6 hours as needed for diarrhea until it slows up he should go for COVID testing tomorrow If not improving over the next 48 hours he should let us know. Soft foods, bland diet, plenty of liquids  If fevers bloody stools mucousy or worse needs to call us  he should do COVID testing at the drive-through at the hospital on Wednesday

## 2018-12-13 NOTE — Telephone Encounter (Signed)
Discussed with pt. Pt verbalized understanding of all.  

## 2018-12-17 ENCOUNTER — Other Ambulatory Visit: Payer: Self-pay | Admitting: Family Medicine

## 2018-12-30 ENCOUNTER — Other Ambulatory Visit: Payer: Self-pay | Admitting: Family Medicine

## 2019-02-03 DIAGNOSIS — R69 Illness, unspecified: Secondary | ICD-10-CM | POA: Diagnosis not present

## 2019-02-15 ENCOUNTER — Telehealth: Payer: Self-pay | Admitting: Family Medicine

## 2019-02-15 NOTE — Telephone Encounter (Signed)
1 refill I do recommend a virtual visit in December for medication checkup

## 2019-02-15 NOTE — Telephone Encounter (Signed)
Please contact patient to schedule appointment. Will send in one refills. Thank you

## 2019-02-16 NOTE — Telephone Encounter (Signed)
vm box full unable to leave message to schedule virtual

## 2019-03-20 ENCOUNTER — Other Ambulatory Visit: Payer: Self-pay | Admitting: Family Medicine

## 2019-03-20 NOTE — Telephone Encounter (Signed)
Please contact pt to set up appt, then may route back to nurses. Thank you

## 2019-03-20 NOTE — Telephone Encounter (Signed)
Patient schedule appointment on 1/15 for medication follow up.

## 2019-03-20 NOTE — Telephone Encounter (Signed)
Tried to call pt vm box full unable to leave message to schedule virtual visit

## 2019-03-31 ENCOUNTER — Other Ambulatory Visit: Payer: Self-pay

## 2019-03-31 ENCOUNTER — Ambulatory Visit (INDEPENDENT_AMBULATORY_CARE_PROVIDER_SITE_OTHER): Payer: Medicare HMO | Admitting: Family Medicine

## 2019-03-31 DIAGNOSIS — E7849 Other hyperlipidemia: Secondary | ICD-10-CM | POA: Diagnosis not present

## 2019-03-31 MED ORDER — ROSUVASTATIN CALCIUM 20 MG PO TABS: 20.0000 mg | ORAL_TABLET | Freq: Every day | ORAL | 1 refills | Status: DC

## 2019-03-31 MED ORDER — TRAMADOL HCL 50 MG PO TABS: 50.0000 mg | ORAL_TABLET | Freq: Four times a day (QID) | ORAL | 2 refills | Status: DC | PRN

## 2019-03-31 MED ORDER — POTASSIUM CHLORIDE CRYS ER 10 MEQ PO TBCR: EXTENDED_RELEASE_TABLET | ORAL | 6 refills | Status: DC

## 2019-03-31 MED ORDER — FAMOTIDINE 20 MG PO TABS: ORAL_TABLET | ORAL | 6 refills | Status: DC

## 2019-03-31 MED ORDER — TORSEMIDE 20 MG PO TABS: ORAL_TABLET | ORAL | 6 refills | Status: DC

## 2019-03-31 NOTE — Progress Notes (Signed)
   Subjective:    Patient ID: [name], male    DOB: [birth year], 86 y.o.   MRN: [id number]  HPI Pt here today for medication check. Pt states his hands have been swollen for a bout a week. Pt states he is doing well on all meds.  The patient's had intermittent swelling in the hand he is he relates this to arthritis denies any other major issues  His reflux issues overall are doing well denies any major issues there  Takes his diuretic on a regular basis and potassium denies any trouble Takes his cholesterol medicine watches diet denies any trouble with the medicine Virtual Visit via Telephone Note  I connected with [name] on 03/31/19 at 10:00 AM EST by telephone and verified that I am speaking with the correct person using two identifiers.  Location: Patient: home Provider: office   I discussed the limitations, risks, security and privacy concerns of performing an evaluation and management service by telephone and the availability of in person appointments. I also discussed with the patient that there may be a patient responsible charge related to this service. The patient expressed understanding and agreed to proceed.   History of Present Illness:    Observations/Objective:   Assessment and Plan:   Follow Up Instructions:    I discussed the assessment and treatment plan with the patient. The patient was provided an opportunity to ask questions and all were answered. The patient agreed with the plan and demonstrated an understanding of the instructions.   The patient was advised to call back or seek an in-person evaluation if the symptoms worsen or if the condition fails to improve as anticipated.  I provided 15 minutes of non-face-to-face time during this encounter.       Review of Systems  Constitutional: Negative for activity change.  HENT: Negative for congestion and rhinorrhea.   Respiratory: Negative for cough and shortness of breath.    Cardiovascular: Negative for chest pain.  Gastrointestinal: Negative for abdominal pain, diarrhea, nausea and vomiting.  Genitourinary: Negative for dysuria and hematuria.  Neurological: Negative for weakness and headaches.  Psychiatric/Behavioral: Negative for behavioral problems and confusion.       Objective:   Physical Exam  Today's visit was via telephone Physical exam was not possible for this visit       Assessment & Plan:  Hand arthralgia-Tylenol as needed do not take anti-inflammatories May use tramadol when the pain is severe  Stop 81 mg aspirin the risk of bleeding outweighs the benefit  Pedal edema may use diuretic and potassium ongoing  Hyperlipidemia continue current medications check lab work later in the spring

## 2019-05-06 ENCOUNTER — Other Ambulatory Visit: Payer: Self-pay | Admitting: Family Medicine

## 2019-05-17 ENCOUNTER — Other Ambulatory Visit: Payer: Self-pay | Admitting: *Deleted

## 2019-05-17 DIAGNOSIS — I714 Abdominal aortic aneurysm, without rupture, unspecified: Secondary | ICD-10-CM

## 2019-05-17 DIAGNOSIS — I700 Atherosclerosis of aorta: Secondary | ICD-10-CM

## 2019-05-18 ENCOUNTER — Telehealth: Payer: Self-pay | Admitting: Family Medicine

## 2019-05-18 DIAGNOSIS — I10 Essential (primary) hypertension: Secondary | ICD-10-CM

## 2019-05-18 DIAGNOSIS — Z79899 Other long term (current) drug therapy: Secondary | ICD-10-CM

## 2019-05-18 NOTE — Telephone Encounter (Addendum)
Blood work ordered in [employer]- Patient notified and stated he is cancelling the CT scan and doesn't want to do it or have it rescheduled. Scan was from tickler file to repeat in 3 years to recheck aorta atherosclerosis.

## 2019-05-18 NOTE — Telephone Encounter (Signed)
Pt is having CT abd/pelv with contrast on Monday 05/22/2019 - pt needs labs  Please order & call pt

## 2019-05-19 DIAGNOSIS — I10 Essential (primary) hypertension: Secondary | ICD-10-CM | POA: Diagnosis not present

## 2019-05-19 DIAGNOSIS — Z79899 Other long term (current) drug therapy: Secondary | ICD-10-CM | POA: Diagnosis not present

## 2019-05-20 LAB — BASIC METABOLIC PANEL
BUN/Creatinine Ratio: 9 — ABNORMAL LOW (ref 10–24)
BUN: 11 mg/dL (ref 10–36)
CO2: 22 mmol/L (ref 20–29)
Calcium: 9.5 mg/dL (ref 8.6–10.2)
Chloride: 106 mmol/L (ref 96–106)
Creatinine, Ser: 1.27 mg/dL (ref 0.76–1.27)
GFR calc Af Amer: 57 mL/min/{1.73_m2} — ABNORMAL LOW (ref >59)
GFR calc non Af Amer: 49 mL/min/{1.73_m2} — ABNORMAL LOW (ref >59)
Glucose: 97 mg/dL (ref 65–99)
Potassium: 4.3 mmol/L (ref 3.5–5.2)
Sodium: 142 mmol/L (ref 134–144)

## 2019-05-21 NOTE — Telephone Encounter (Signed)
So if the patient wants to cancel the CT he can otherwise if he follows through with that we will discuss the results afterwards

## 2019-05-22 ENCOUNTER — Ambulatory Visit (HOSPITAL_COMMUNITY)
Admission: RE | Admit: 2019-05-22 | Discharge: 2019-05-22 | Disposition: A | Discharge status: Home / Self Care | Payer: Medicare HMO | Source: Ambulatory Visit | Attending: Family Medicine | Admitting: Family Medicine

## 2019-05-22 ENCOUNTER — Telehealth: Payer: Self-pay | Admitting: Family Medicine

## 2019-05-22 ENCOUNTER — Other Ambulatory Visit: Payer: Self-pay

## 2019-05-22 DIAGNOSIS — I700 Atherosclerosis of aorta: Secondary | ICD-10-CM | POA: Diagnosis not present

## 2019-05-22 DIAGNOSIS — I714 Abdominal aortic aneurysm, without rupture: Secondary | ICD-10-CM | POA: Insufficient documentation

## 2019-05-22 DIAGNOSIS — I712 Thoracic aortic aneurysm, without rupture: Secondary | ICD-10-CM | POA: Diagnosis not present

## 2019-05-22 MED ORDER — IOHEXOL 300 MG/ML  SOLN
100.0000 mL | Freq: Once | INTRAMUSCULAR | Status: AC | PRN
  Administered 2019-05-22: 100 mL via INTRAVENOUS

## 2019-05-22 NOTE — Telephone Encounter (Signed)
Contacted pt wife and informed her that we were calling regarding CT scan and that if he had CT done we would be following up with results.

## 2019-05-22 NOTE — Telephone Encounter (Signed)
Attempted to contact patient. Voicemail is full, unable to leave message

## 2019-05-22 NOTE — Telephone Encounter (Signed)
Spoke with wife Murray Hodgkins and informed her that since pt has CT done this morning we would follow up when we received results. Pt wife verbalized understanding.

## 2019-05-22 NOTE — Telephone Encounter (Signed)
Patient cancelled CT scan thru radiology and did not reschedule

## 2019-05-22 NOTE — Telephone Encounter (Signed)
Please have patient do a follow-up later this spring with [place] in person or virtual this could be April or May thank you

## 2019-05-22 NOTE — Telephone Encounter (Signed)
Patient left a message on the billing line saying that he had a missed call from our office from earlier today but that he was at the hospital having a CT scan so that is why he did not answer.

## 2019-05-24 ENCOUNTER — Ambulatory Visit (INDEPENDENT_AMBULATORY_CARE_PROVIDER_SITE_OTHER): Payer: Medicare HMO | Admitting: Family Medicine

## 2019-05-24 ENCOUNTER — Other Ambulatory Visit: Payer: Self-pay | Admitting: Family Medicine

## 2019-05-24 ENCOUNTER — Encounter: Payer: Self-pay | Admitting: Family Medicine

## 2019-05-24 ENCOUNTER — Other Ambulatory Visit: Payer: Self-pay

## 2019-05-24 VITALS — BP 122/76 | Temp 97.7°F | Wt 172.6 lb

## 2019-05-24 DIAGNOSIS — I714 Abdominal aortic aneurysm, without rupture, unspecified: Secondary | ICD-10-CM

## 2019-05-24 DIAGNOSIS — I10 Essential (primary) hypertension: Secondary | ICD-10-CM

## 2019-05-24 DIAGNOSIS — I951 Orthostatic hypotension: Secondary | ICD-10-CM

## 2019-05-24 MED ORDER — POTASSIUM CHLORIDE CRYS ER 10 MEQ PO TBCR: EXTENDED_RELEASE_TABLET | ORAL | 6 refills | Status: DC

## 2019-05-24 MED ORDER — TORSEMIDE 20 MG PO TABS: ORAL_TABLET | ORAL | 6 refills | Status: DC

## 2019-05-24 NOTE — Progress Notes (Signed)
   Subjective:    Patient ID: [name], male    DOB: [birth year], 86 y.o.   MRN: [id number]  HPI  Patient arrives for a follow up on a recent CT scan. Patient would like to discuss the results. Patient also states his right side and head has hurt since having the CT scan.  The patient relates that he was choking on some food and his daughter did the Heimlich maneuver maneuver on and then it got the food out but he relates some right sided rib pain and discomfort but denies any other major setbacks states overall he feels pretty good for being 86 years old Fall Risk  05/24/2019 03/31/2019 01/13/2016 01/11/2014  Falls in the past year? 0 0 No No  Risk for fall due to : - Impaired balance/gait - -  Follow up Falls evaluation completed Falls evaluation completed - -    Review of Systems    Negative for any chest pressure tightness shortness of breath positive for chest wall pain right side negative for abdominal pain Objective:   Physical Exam  Lungs are clear respiratory rate normal heart regular chest wall mild tenderness abdomen soft extremities no edema skin warm dry      Assessment & Plan:  Patient does have some pedal edema but is not severe He is having orthostatic hypotension Reduce the Demadex I recommend a half a tablet 3 days a week Potassium may be done on a 3-day week basis as well Recheck in 3 months time  Musculoskeletal chest pain related to Heimlich maneuver that his daughter did on him after he choked He denies any trouble with ongoing choking  Aneurysm stable we will do a follow-up ultrasound in 3 years

## 2019-05-24 NOTE — Patient Instructions (Signed)
Patient was given written instructions and encouraged to do vaccine

## 2019-06-01 ENCOUNTER — Telehealth: Payer: Self-pay | Admitting: *Deleted

## 2019-06-01 NOTE — Telephone Encounter (Signed)
I am fine with tomorrow

## 2019-06-01 NOTE — Telephone Encounter (Signed)
Pt called and wanted appt for tomorrow for chest pain that he has been having since he was seen on 3/10.pt was diagnosed with Musculoskeletal chest pain related to Heimlich maneuver that his daughter did on him after he choked. Pt states the pain is the same as when seen and it is on the right side of chest and worse when he moves around. He is taking tylenol for the pain and it does relieve the pain but when it wears off the pain comes back. Offered pt appt today and he declined. States he would rather come tomorrow. appt given for tomorrow

## 2019-06-02 ENCOUNTER — Ambulatory Visit (INDEPENDENT_AMBULATORY_CARE_PROVIDER_SITE_OTHER): Payer: Medicare HMO | Admitting: Family Medicine

## 2019-06-02 ENCOUNTER — Other Ambulatory Visit: Payer: Self-pay

## 2019-06-02 VITALS — BP 132/68 | Temp 97.9°F | Wt 173.4 lb

## 2019-06-02 DIAGNOSIS — R0789 Other chest pain: Secondary | ICD-10-CM | POA: Diagnosis not present

## 2019-06-02 MED ORDER — DICLOFENAC SODIUM 50 MG PO TBEC: 50.0000 mg | DELAYED_RELEASE_TABLET | Freq: Two times a day (BID) | ORAL | 0 refills | Status: DC

## 2019-06-02 NOTE — Progress Notes (Deleted)
Established Patient Office Visit  Subjective:  Patient ID: [name], male    DOB: 1925/10/20  Age: 86 y.o. MRN: [id number]  CC:  Chief Complaint  Patient presents with  . Chest Pain    HPI [name] presents for ***  Past Medical History:  Diagnosis Date  . Asthma   . Diverticulosis   . GERD (gastroesophageal reflux disease)   . Gout   . Heart murmur    Evaluation by a cardiologist years ago was reportedly negative  . HTN (hypertension)   . Osteoarthritis   . Reflux     Past Surgical History:  Procedure Laterality Date  . APPENDECTOMY  2007  . COLONOSCOPY  2003  . COLONOSCOPY N/A 12/28/2012   Procedure: COLONOSCOPY;  Surgeon: Rogene Houston, MD;  Location: [facility];  Service: Endoscopy;  Laterality: N/A;  255-rescheduled to 10:30 Ann notified pt  . EYE SURGERY Right    cataract removal  . INGUINAL HERNIA REPAIR     Right  . JOINT REPLACEMENT     right total knee RIGHT total knee arthroplasty  . TOTAL KNEE ARTHROPLASTY  09/2009   Right    Family History  Problem Relation Age of Onset  . Hypertension Mother   . Hypertension Father   . Heart attack Father   . Cancer Brother        colon  . Arthritis Other   . Heart disease Other        Male < 55  . Uterine cancer Maternal Aunt     Social History   Socioeconomic History  . Marital status: Married    Spouse name: Not on file  . Number of children: Not on file  . Years of education: Not on file  . Highest education level: Not on file  Occupational History  . Occupation: Work at hospital  Tobacco Use  . Smoking status: Former Smoker    Types: Pipe    Start date: 12/04/1939  . Smokeless tobacco: Never Used  . Tobacco comment: quit smoking cigarettes in 1971  Substance and Sexual Activity  . Alcohol use: No  . Drug use: No  . Sexual activity: Not on file  Other Topics Concern  . Not on file  Social History Narrative  . Not on file   Social Determinants of Health    Financial Resource Strain:   . Difficulty of Paying Living Expenses:   Food Insecurity:   . Worried About Charity fundraiser in the Last Year:   . Arboriculturist in the Last Year:   Transportation Needs:   . Film/video editor (Medical):   Marland Kitchen Lack of Transportation (Non-Medical):   Physical Activity:   . Days of Exercise per Week:   . Minutes of Exercise per Session:   Stress:   . Feeling of Stress :   Social Connections:   . Frequency of Communication with Friends and Family:   . Frequency of Social Gatherings with Friends and Family:   . Attends Religious Services:   . Active Member of Clubs or Organizations:   . Attends Archivist Meetings:   Marland Kitchen Marital Status:   Intimate Partner Violence:   . Fear of Current or Ex-Partner:   . Emotionally Abused:   Marland Kitchen Physically Abused:   . Sexually Abused:     Outpatient Medications Prior to Visit  Medication Sig Dispense Refill  . acetaminophen (TYLENOL) 650 MG CR tablet Take 650-1,300 mg by  mouth every 8 (eight) hours as needed for pain.    Marland Kitchen docusate sodium (COLACE) 100 MG capsule Take 2 capsules (200 mg total) by mouth at bedtime.  0  . famotidine (PEPCID) 20 MG tablet TAKE (1) TABLET BY MOUTH TWICE DAILY. 60 tablet 6  . guaiFENesin (MUCINEX) 600 MG 12 hr tablet Take 1 tablet (600 mg total) by mouth 2 (two) times daily as needed.    . mometasone (ELOCON) 0.1 % cream APPLY TO AFFECTED AREAS TWICE DAILY FOR 3 WEEKS. 45 g 0  . Polyethylene Glycol 3350 (MIRALAX PO) Take by mouth.    . potassium chloride (KLOR-CON) 10 MEQ tablet TAKE (1) TABLET when taking torsemide , take one Mon Wed and Friday 30 tablet 6  . rosuvastatin (CRESTOR) 20 MG tablet Take 1 tablet (20 mg total) by mouth daily. 90 tablet 1  . torsemide (DEMADEX) 20 MG tablet 1/2 q Mon Wed and Friday only 30 tablet 6  . traMADol (ULTRAM) 50 MG tablet Take 1 tablet (50 mg total) by mouth every 6 (six) hours as needed. 24 tablet 2  . triamcinolone cream (KENALOG) 0.1 %  APPLY TO AFFECTED AREAS TWICE DAILY AS NEEDED. 120 g 2  . VENTOLIN HFA 108 (90 Base) MCG/ACT inhaler INHALE 2 PUFFS EVERY 4 HOURS AS NEEDED. 18 g 3   No facility-administered medications prior to visit.    Allergies  Allergen Reactions  . Penicillins Rash    Has patient had a PCN reaction causing immediate rash, facial/tongue/throat swelling, SOB or lightheadedness with hypotension: Yes Has patient had a PCN reaction causing severe rash involving mucus membranes or skin necrosis: No Has patient had a PCN reaction that required hospitalization: No Has patient had a PCN reaction occurring within the last 10 years: No If all of the above answers are "NO", then may proceed with Cephalosporin use.     ROS Review of Systems    Objective:    Physical Exam  There were no vitals taken for this visit. Wt Readings from Last 3 Encounters:  05/24/19 172 lb 9.6 oz (78.3 kg)  08/09/18 169 lb 12.8 oz (77 kg)  04/21/18 164 lb (74.4 kg)     Health Maintenance Due  Topic Date Due  . TETANUS/TDAP  Never done    There are no preventive care reminders to display for this patient.  Lab Results  Component Value Date   TSH 3.025 04/06/2013   Lab Results  Component Value Date   WBC 4.7 03/02/2017   HGB 13.6 03/02/2017   HCT 40.4 03/02/2017   MCV 97 03/02/2017   PLT 152 03/02/2017   Lab Results  Component Value Date   NA 142 05/19/2019   K 4.3 05/19/2019   CO2 22 05/19/2019   GLUCOSE 97 05/19/2019   BUN 11 05/19/2019   CREATININE 1.27 05/19/2019   BILITOT 1.9 (H) 08/09/2018   ALKPHOS 63 08/09/2018   AST 23 08/09/2018   ALT 14 08/09/2018   PROT 7.0 08/09/2018   ALBUMIN 4.4 08/09/2018   CALCIUM 9.5 05/19/2019   ANIONGAP 6 09/24/2016   Lab Results  Component Value Date   CHOL 155 11/24/2017   Lab Results  Component Value Date   HDL 70 11/24/2017   Lab Results  Component Value Date   LDLCALC 71 11/24/2017   Lab Results  Component Value Date   TRIG 72 11/24/2017    Lab Results  Component Value Date   CHOLHDL 2.2 11/24/2017   No results found for:  HGBA1C    Assessment & Plan:   Problem List Items Addressed This Visit    None      No orders of the defined types were placed in this encounter.   Follow-up: No follow-ups on file.    Dayton Bailiff, LPN

## 2019-06-02 NOTE — Progress Notes (Signed)
   Subjective:    Patient ID: [name], male    DOB: March 06, 1926, 86 y.o.   MRN: [id number]  Chest Pain  This is a new problem.  pt seen on 3/10 for Musculoskeletal chest pain related to Heimlich maneuver that his daughter did on him after he choked. Pt states he is still hurting on the right side of his chest. He takes tylenol and that does help for a little while.   Pain is sharp in nature.  Right anterior chest.  Radiating towards the back.  Worse with certain movements.  Worse with deep breath.  See prior notes  Actually had Heimlich maneuver applied to him earlier in the month due to choking on a piece of beef  Review of Systems  Cardiovascular: Positive for chest pain.       Objective:   Physical Exam  Alert active good hydration.  Lungs clear.  Heart rate and rhythm.  Some pain today anterior pressure right anterior chest wall.  No crackles no wheezes no tachypnea      Assessment & Plan:  Impression chest wall pain.  Impressive per patient.  Doubt rib fracture though always possible.  With nonvaccinated status would prefer not to send for x-ray since it will not help therapy anyway.  Voltaren mild dose 50 mg 1 twice daily with food over the next 10 days.  May add Tylenol as needed

## 2019-06-09 NOTE — Telephone Encounter (Signed)
error 

## 2019-07-11 ENCOUNTER — Telehealth: Payer: Self-pay | Admitting: Family Medicine

## 2019-07-11 NOTE — Telephone Encounter (Signed)
I would recommend bland diet such as applesauce crackers yogurt bananas As for liquids I would recommend clear juices, 7-Up or Sprite, Pedialyte is acceptable Small amounts of fluids every 3 to 5 minutes if any vomiting wait 20 minutes then start over. Does patient feel she needs any type of nausea medicine?      It is felt that the patient more than likely has a viral gastroenteritis please see if he needs any medicine for nausea If his condition is not improving by tomorrow he will need to be seen

## 2019-07-11 NOTE — Telephone Encounter (Signed)
Ava called back to check on message

## 2019-07-11 NOTE — Telephone Encounter (Signed)
Having vomiting and diarrhea. Started in the middle of the night. Has vomited about 4 -5 times. And has had diarrhea all night. No fever. Every time he eats or drinks it comes back up. Tried pepto and it did not help.  ( daughter Ava is also having diarrhea)   [facility].

## 2019-07-11 NOTE — Telephone Encounter (Signed)
Discussed with pt's daughter ava but she said she didn't know if he wanted anything for nausea. When she called to check on him she states he was feeling pretty good. I tried to call pt twice this afternoon to see and no answer.

## 2019-07-11 NOTE — Telephone Encounter (Signed)
Pt's daughter calling stating he is now having the same symptoms she is having diarrhea and they would like a nurse to call.

## 2019-07-12 NOTE — Telephone Encounter (Signed)
Called pt and he states he is feeling much better and not having any vomiting or diarrhea. Does not need anything at this time

## 2019-08-30 DIAGNOSIS — B351 Tinea unguium: Secondary | ICD-10-CM | POA: Diagnosis not present

## 2019-09-15 ENCOUNTER — Other Ambulatory Visit: Payer: Self-pay

## 2019-09-15 ENCOUNTER — Encounter: Payer: Self-pay | Admitting: Family Medicine

## 2019-09-15 ENCOUNTER — Ambulatory Visit (INDEPENDENT_AMBULATORY_CARE_PROVIDER_SITE_OTHER): Payer: Medicare HMO | Admitting: Family Medicine

## 2019-09-15 VITALS — BP 132/88 | Temp 97.8°F | Ht 70.0 in | Wt 171.0 lb

## 2019-09-15 DIAGNOSIS — E7849 Other hyperlipidemia: Secondary | ICD-10-CM

## 2019-09-15 DIAGNOSIS — I10 Essential (primary) hypertension: Secondary | ICD-10-CM

## 2019-09-15 DIAGNOSIS — D509 Iron deficiency anemia, unspecified: Secondary | ICD-10-CM

## 2019-09-15 MED ORDER — POTASSIUM CHLORIDE CRYS ER 10 MEQ PO TBCR: EXTENDED_RELEASE_TABLET | ORAL | 6 refills | Status: DC

## 2019-09-15 MED ORDER — ROSUVASTATIN CALCIUM 20 MG PO TABS: 20.0000 mg | ORAL_TABLET | Freq: Every day | ORAL | 1 refills | Status: DC

## 2019-09-15 MED ORDER — AMLODIPINE BESYLATE 2.5 MG PO TABS: 2.5000 mg | ORAL_TABLET | Freq: Every day | ORAL | 5 refills | Status: DC

## 2019-09-15 NOTE — Progress Notes (Signed)
   Subjective:    Patient ID: [name], male    DOB: 02-27-1926, 86 y.o.   MRN: [id number]  Hyperlipidemia This is a chronic problem. Treatments tried: rosuvastatin. There are no compliance problems (takes meds every day, eats healthy, ).    Sob for the past year. Pt states since wearing masks.   This patient states when he wears a mask it makes him feel like he cannot breathe well but he denies any chest tightness pressure pain Fall Risk  09/15/2019 05/24/2019 03/31/2019 01/13/2016 01/11/2014  Falls in the past year? 0 0 0 No No  Risk for fall due to : - - Impaired balance/gait - -  Follow up Falls evaluation completed Falls evaluation completed Falls evaluation completed - -    Review of Systems     Objective:   Physical Exam Lungs clear respiratory rate normal heart is regular pulse normal BP good extremities no edema       Assessment & Plan:  I believe part of his shortness of breath is deconditioning he is not walking as much because of his arthritis I sympathize that he has to wear a mask but that is the safest thing he has done the immunization Patient will continue his cholesterol medicine as well as amlodipine.  Follow-up in 4 to 6 months

## 2019-09-16 LAB — CBC WITH DIFFERENTIAL/PLATELET
Basophils Absolute: 0.1 10*3/uL (ref 0.0–0.2)
Basos: 1 %
EOS (ABSOLUTE): 0.1 10*3/uL (ref 0.0–0.4)
Eos: 2 %
Hematocrit: 38.1 % (ref 37.5–51.0)
Hemoglobin: 12.7 g/dL — ABNORMAL LOW (ref 13.0–17.7)
Immature Grans (Abs): 0 10*3/uL (ref 0.0–0.1)
Immature Granulocytes: 0 %
Lymphocytes Absolute: 1.1 10*3/uL (ref 0.7–3.1)
Lymphs: 23 %
MCH: 31.8 pg (ref 26.6–33.0)
MCHC: 33.3 g/dL (ref 31.5–35.7)
MCV: 95 fL (ref 79–97)
Monocytes Absolute: 0.6 10*3/uL (ref 0.1–0.9)
Monocytes: 13 %
Neutrophils Absolute: 3.1 10*3/uL (ref 1.4–7.0)
Neutrophils: 61 %
Platelets: 123 10*3/uL — ABNORMAL LOW (ref 150–450)
RBC: 4 x10E6/uL — ABNORMAL LOW (ref 4.14–5.80)
RDW: 11.3 % — ABNORMAL LOW (ref 11.6–15.4)
WBC: 5 10*3/uL (ref 3.4–10.8)

## 2019-09-16 LAB — LIPID PANEL
Chol/HDL Ratio: 2.2 ratio (ref 0.0–5.0)
Cholesterol, Total: 173 mg/dL (ref 100–199)
HDL: 77 mg/dL (ref >39)
LDL Chol Calc (NIH): 81 mg/dL (ref 0–99)
Triglycerides: 81 mg/dL (ref 0–149)
VLDL Cholesterol Cal: 15 mg/dL (ref 5–40)

## 2019-09-16 LAB — COMPREHENSIVE METABOLIC PANEL
ALT: 9 IU/L (ref 0–44)
AST: 18 IU/L (ref 0–40)
Albumin/Globulin Ratio: 1.8 (ref 1.2–2.2)
Albumin: 4.6 g/dL (ref 3.5–4.6)
Alkaline Phosphatase: 61 IU/L (ref 48–121)
BUN/Creatinine Ratio: 11 (ref 10–24)
BUN: 13 mg/dL (ref 10–36)
Bilirubin Total: 2.3 mg/dL — ABNORMAL HIGH (ref 0.0–1.2)
CO2: 27 mmol/L (ref 20–29)
Calcium: 9.7 mg/dL (ref 8.6–10.2)
Chloride: 105 mmol/L (ref 96–106)
Creatinine, Ser: 1.22 mg/dL (ref 0.76–1.27)
GFR calc Af Amer: 60 mL/min/{1.73_m2} (ref >59)
GFR calc non Af Amer: 52 mL/min/{1.73_m2} — ABNORMAL LOW (ref >59)
Globulin, Total: 2.5 g/dL (ref 1.5–4.5)
Glucose: 89 mg/dL (ref 65–99)
Potassium: 4.2 mmol/L (ref 3.5–5.2)
Sodium: 144 mmol/L (ref 134–144)
Total Protein: 7.1 g/dL (ref 6.0–8.5)

## 2019-09-17 ENCOUNTER — Encounter: Payer: Self-pay | Admitting: Family Medicine

## 2019-10-30 ENCOUNTER — Other Ambulatory Visit: Payer: Self-pay

## 2019-10-30 ENCOUNTER — Ambulatory Visit (INDEPENDENT_AMBULATORY_CARE_PROVIDER_SITE_OTHER): Payer: Medicare HMO | Admitting: Family Medicine

## 2019-10-30 VITALS — BP 128/78 | Temp 95.7°F | Wt 171.0 lb

## 2019-10-30 DIAGNOSIS — E7849 Other hyperlipidemia: Secondary | ICD-10-CM

## 2019-10-30 DIAGNOSIS — I10 Essential (primary) hypertension: Secondary | ICD-10-CM | POA: Diagnosis not present

## 2019-10-30 NOTE — Progress Notes (Signed)
   Subjective:    Patient ID: [name], male    DOB: January 20, 1926, 86 y.o.   MRN: [id number]  HPI Pt here for follow up on HTN. Pt states he has not any issues except for sweating more than usual and head will feel swimmy.  (Pt had COVID shot but not sure which one-had one in May and one in June) He feels that he is doing well overall.  At other times he does state that occasionally breaks out in a sweat but not severe denies shortness of breath with activity but occasionally gets a little dizzy when he stands up Review of Systems  Constitutional: Negative for diaphoresis and fatigue.  HENT: Negative for congestion and rhinorrhea.   Respiratory: Negative for cough and shortness of breath.   Cardiovascular: Negative for chest pain and leg swelling.  Gastrointestinal: Negative for abdominal pain and diarrhea.  Skin: Negative for color change and rash.  Neurological: Negative for dizziness and headaches.  Psychiatric/Behavioral: Negative for behavioral problems and confusion.       Objective:   Physical Exam Vitals reviewed.  Constitutional:      General: He is not in acute distress. HENT:     Head: Normocephalic and atraumatic.  Eyes:     General:        Right eye: No discharge.        Left eye: No discharge.  Neck:     Trachea: No tracheal deviation.  Cardiovascular:     Rate and Rhythm: Normal rate and regular rhythm.     Heart sounds: Normal heart sounds. No murmur heard.   Pulmonary:     Effort: Pulmonary effort is normal. No respiratory distress.     Breath sounds: Normal breath sounds.  Lymphadenopathy:     Cervical: No cervical adenopathy.  Skin:    General: Skin is warm and dry.  Neurological:     Mental Status: He is alert.     Coordination: Coordination normal.  Psychiatric:        Behavior: Behavior normal.     Blood pressure was checked sitting and standing no appreciable difference.  Patient was observed walking with a cane no significant  ataxia No sign of Parkinson's      Assessment & Plan:  HTN doing a good job taking his blood pressure medicine keeping numbers under good control watching diet staying active  Hyperlipidemia previous labs look good no need to do further labs currently continue current medication  Potassium-he states is difficult to swallow I discussed with him breaking up the potassium tablet or dissolving it into water  We will follow up this patient in 4 months Flu vaccine senior dose this fall

## 2019-10-30 NOTE — Patient Instructions (Signed)
DASH Eating Plan DASH stands for "Dietary Approaches to Stop Hypertension." The DASH eating plan is a healthy eating plan that has been shown to reduce high blood pressure (hypertension). It may also reduce your risk for type 2 diabetes, heart disease, and stroke. The DASH eating plan may also help with weight loss. What are tips for following this plan?  General guidelines  Avoid eating more than 2,300 mg (milligrams) of salt (sodium) a day. If you have hypertension, you may need to reduce your sodium intake to 1,500 mg a day.  Limit alcohol intake to no more than 1 drink a day for nonpregnant women and 2 drinks a day for men. One drink equals 12 oz of beer, 5 oz of wine, or 1 oz of hard liquor.  Work with your health care provider to maintain a healthy body weight or to lose weight. Ask what an ideal weight is for you.  Get at least 30 minutes of exercise that causes your heart to beat faster (aerobic exercise) most days of the week. Activities may include walking, swimming, or biking.  Work with your health care provider or diet and nutrition specialist (dietitian) to adjust your eating plan to your individual calorie needs. Reading food labels   Check food labels for the amount of sodium per serving. Choose foods with less than 5 percent of the Daily Value of sodium. Generally, foods with less than 300 mg of sodium per serving fit into this eating plan.  To find whole grains, look for the word "whole" as the first word in the ingredient list. Shopping  Buy products labeled as "low-sodium" or "no salt added."  Buy fresh foods. Avoid canned foods and premade or frozen meals. Cooking  Avoid adding salt when cooking. Use salt-free seasonings or herbs instead of table salt or sea salt. Check with your health care provider or pharmacist before using salt substitutes.  Do not fry foods. Cook foods using healthy methods such as baking, boiling, grilling, and broiling instead.  Cook with  heart-healthy oils, such as olive, canola, soybean, or sunflower oil. Meal planning  Eat a balanced diet that includes: ? 5 or more servings of fruits and vegetables each day. At each meal, try to fill half of your plate with fruits and vegetables. ? Up to 6-8 servings of whole grains each day. ? Less than 6 oz of lean meat, poultry, or fish each day. A 3-oz serving of meat is about the same size as a deck of cards. One egg equals 1 oz. ? 2 servings of low-fat dairy each day. ? A serving of nuts, seeds, or beans 5 times each week. ? Heart-healthy fats. Healthy fats called Omega-3 fatty acids are found in foods such as flaxseeds and coldwater fish, like sardines, salmon, and mackerel.  Limit how much you eat of the following: ? Canned or prepackaged foods. ? Food that is high in trans fat, such as fried foods. ? Food that is high in saturated fat, such as fatty meat. ? Sweets, desserts, sugary drinks, and other foods with added sugar. ? Full-fat dairy products.  Do not salt foods before eating.  Try to eat at least 2 vegetarian meals each week.  Eat more home-cooked food and less restaurant, buffet, and fast food.  When eating at a restaurant, ask that your food be prepared with less salt or no salt, if possible. What foods are recommended? The items listed may not be a complete list. Talk with your dietitian about   what dietary choices are best for you. Grains Whole-grain or whole-wheat bread. Whole-grain or whole-wheat pasta. Brown rice. Oatmeal. Quinoa. Bulgur. Whole-grain and low-sodium cereals. Pita bread. Low-fat, low-sodium crackers. Whole-wheat flour tortillas. Vegetables Fresh or frozen vegetables (raw, steamed, roasted, or grilled). Low-sodium or reduced-sodium tomato and vegetable juice. Low-sodium or reduced-sodium tomato sauce and tomato paste. Low-sodium or reduced-sodium canned vegetables. Fruits All fresh, dried, or frozen fruit. Canned fruit in natural juice (without  added sugar). Meat and other protein foods Skinless chicken or turkey. Ground chicken or turkey. Pork with fat trimmed off. Fish and seafood. Egg whites. Dried beans, peas, or lentils. Unsalted nuts, nut butters, and seeds. Unsalted canned beans. Lean cuts of beef with fat trimmed off. Low-sodium, lean deli meat. Dairy Low-fat (1%) or fat-free (skim) milk. Fat-free, low-fat, or reduced-fat cheeses. Nonfat, low-sodium ricotta or cottage cheese. Low-fat or nonfat yogurt. Low-fat, low-sodium cheese. Fats and oils Soft margarine without trans fats. Vegetable oil. Low-fat, reduced-fat, or light mayonnaise and salad dressings (reduced-sodium). Canola, safflower, olive, soybean, and sunflower oils. Avocado. Seasoning and other foods Herbs. Spices. Seasoning mixes without salt. Unsalted popcorn and pretzels. Fat-free sweets. What foods are not recommended? The items listed may not be a complete list. Talk with your dietitian about what dietary choices are best for you. Grains Baked goods made with fat, such as croissants, muffins, or some breads. Dry pasta or rice meal packs. Vegetables Creamed or fried vegetables. Vegetables in a cheese sauce. Regular canned vegetables (not low-sodium or reduced-sodium). Regular canned tomato sauce and paste (not low-sodium or reduced-sodium). Regular tomato and vegetable juice (not low-sodium or reduced-sodium). Pickles. Olives. Fruits Canned fruit in a light or heavy syrup. Fried fruit. Fruit in cream or butter sauce. Meat and other protein foods Fatty cuts of meat. Ribs. Fried meat. Bacon. Sausage. Bologna and other processed lunch meats. Salami. Fatback. Hotdogs. Bratwurst. Salted nuts and seeds. Canned beans with added salt. Canned or smoked fish. Whole eggs or egg yolks. Chicken or turkey with skin. Dairy Whole or 2% milk, cream, and half-and-half. Whole or full-fat cream cheese. Whole-fat or sweetened yogurt. Full-fat cheese. Nondairy creamers. Whipped toppings.  Processed cheese and cheese spreads. Fats and oils Butter. Stick margarine. Lard. Shortening. Ghee. Bacon fat. Tropical oils, such as coconut, palm kernel, or palm oil. Seasoning and other foods Salted popcorn and pretzels. Onion salt, garlic salt, seasoned salt, table salt, and sea salt. Worcestershire sauce. Tartar sauce. Barbecue sauce. Teriyaki sauce. Soy sauce, including reduced-sodium. Steak sauce. Canned and packaged gravies. Fish sauce. Oyster sauce. Cocktail sauce. Horseradish that you find on the shelf. Ketchup. Mustard. Meat flavorings and tenderizers. Bouillon cubes. Hot sauce and Tabasco sauce. Premade or packaged marinades. Premade or packaged taco seasonings. Relishes. Regular salad dressings. Where to find more information:  National Heart, Lung, and Blood Institute: www.nhlbi.nih.gov  American Heart Association: www.heart.org Summary  The DASH eating plan is a healthy eating plan that has been shown to reduce high blood pressure (hypertension). It may also reduce your risk for type 2 diabetes, heart disease, and stroke.  With the DASH eating plan, you should limit salt (sodium) intake to 2,300 mg a day. If you have hypertension, you may need to reduce your sodium intake to 1,500 mg a day.  When on the DASH eating plan, aim to eat more fresh fruits and vegetables, whole grains, lean proteins, low-fat dairy, and heart-healthy fats.  Work with your health care provider or diet and nutrition specialist (dietitian) to adjust your eating plan to your   individual calorie needs. This information is not intended to replace advice given to you by your health care provider. Make sure you discuss any questions you have with your health care provider. Document Revised: 02/12/2017 Document Reviewed: 02/24/2016 Elsevier Patient Education  2020 [employer]  

## 2019-11-08 ENCOUNTER — Ambulatory Visit (INDEPENDENT_AMBULATORY_CARE_PROVIDER_SITE_OTHER): Payer: Medicare HMO | Admitting: Family Medicine

## 2019-11-08 ENCOUNTER — Encounter: Payer: Self-pay | Admitting: Family Medicine

## 2019-11-08 ENCOUNTER — Telehealth: Payer: Self-pay | Admitting: *Deleted

## 2019-11-08 ENCOUNTER — Other Ambulatory Visit: Payer: Self-pay

## 2019-11-08 VITALS — BP 132/80 | HR 77 | Temp 97.7°F | Ht 70.0 in | Wt 157.0 lb

## 2019-11-08 DIAGNOSIS — Z79899 Other long term (current) drug therapy: Secondary | ICD-10-CM

## 2019-11-08 DIAGNOSIS — R42 Dizziness and giddiness: Secondary | ICD-10-CM

## 2019-11-08 DIAGNOSIS — R270 Ataxia, unspecified: Secondary | ICD-10-CM | POA: Diagnosis not present

## 2019-11-08 DIAGNOSIS — G5603 Carpal tunnel syndrome, bilateral upper limbs: Secondary | ICD-10-CM | POA: Diagnosis not present

## 2019-11-08 NOTE — Telephone Encounter (Signed)
Patient daughter called stating patient woke up this AM feeling dizzy and like he was going to "fall on his head". Family thinks this may be vertigo. Per Dr. Nicki Reaper he would see patient in the office in 1 hours to check him over. Daughter unsure if she will be able to get here. Daughter aware that if starts to experience any other symptoms or unable to get to the office to take patient to ER.

## 2019-11-08 NOTE — Progress Notes (Signed)
   Subjective:    Patient ID: [name], male    DOB: [birth year], 86 y.o.   MRN: [id number]  HPIpt is with daughter Ava. Had some dizziness last week but got better and came back a couple of days ago.  Patient is having some intermittent dizziness where he feels lightheaded feel dizzy in the head and states he falls to the left denies any room spinning denies nausea or vomiting denies severe headache does not wake him up at night denies any unilateral numbness weakness.  Denies any chest tightness pressure pain or shortness of breath.  Patient does have multiple risk factors including hypertension And hyperlipidemia patient does not smoke Has not had a stroke prior  Review of Systems  Constitutional: Negative for diaphoresis and fatigue.  HENT: Negative for congestion and rhinorrhea.   Respiratory: Negative for cough and shortness of breath.   Cardiovascular: Negative for chest pain and leg swelling.  Gastrointestinal: Negative for abdominal pain and diarrhea.  Skin: Negative for color change and rash.  Neurological: Positive for dizziness. Negative for headaches.  Psychiatric/Behavioral: Negative for behavioral problems and confusion.       Objective:   Physical Exam Vitals reviewed.  Constitutional:      General: He is not in acute distress. HENT:     Head: Normocephalic and atraumatic.  Eyes:     General:        Right eye: No discharge.        Left eye: No discharge.  Neck:     Trachea: No tracheal deviation.  Cardiovascular:     Rate and Rhythm: Normal rate and regular rhythm.     Heart sounds: Normal heart sounds. No murmur heard.   Pulmonary:     Effort: Pulmonary effort is normal. No respiratory distress.     Breath sounds: Normal breath sounds.  Lymphadenopathy:     Cervical: No cervical adenopathy.  Skin:    General: Skin is warm and dry.  Neurological:     Mental Status: He is alert.     Coordination: Coordination normal.  Psychiatric:         Behavior: Behavior normal.    Patient is unsteady with his walking.  Uses a cane stumbles some.  No unilateral numbness or weakness.  Negative nystagmus.  Negative Romberg.       Assessment & Plan:  With his ataxia and dizziness and lightheadedness I do not feel that this is vertigo I am concerned that there could be some underlying mini strokes or other problems going on I recommend MRI may need some physical therapy.  I do not feel the patient needs to go to ER unless symptoms dramatically worsen 1. Bilateral carpal tunnel syndrome Patient does have bilateral carpal tunnel syndrome symptoms I recommend night braces  2. Ataxia See discussion above I recommend MRI of the brain - MR Brain Wo Contrast  3. Dizziness See discussion above - MR Brain Wo Contrast  4. High risk medication use Liver profile indicated - Hepatic function panel

## 2019-11-09 ENCOUNTER — Ambulatory Visit (HOSPITAL_COMMUNITY)
Admission: RE | Admit: 2019-11-09 | Discharge: 2019-11-09 | Disposition: A | Discharge status: Home / Self Care | Payer: Medicare HMO | Source: Ambulatory Visit | Attending: Family Medicine | Admitting: Family Medicine

## 2019-11-09 DIAGNOSIS — J3489 Other specified disorders of nose and nasal sinuses: Secondary | ICD-10-CM | POA: Diagnosis not present

## 2019-11-09 DIAGNOSIS — H748X1 Other specified disorders of right middle ear and mastoid: Secondary | ICD-10-CM | POA: Diagnosis not present

## 2019-11-09 DIAGNOSIS — I6782 Cerebral ischemia: Secondary | ICD-10-CM | POA: Diagnosis not present

## 2019-11-09 DIAGNOSIS — G9389 Other specified disorders of brain: Secondary | ICD-10-CM | POA: Diagnosis not present

## 2019-11-09 DIAGNOSIS — R42 Dizziness and giddiness: Secondary | ICD-10-CM | POA: Insufficient documentation

## 2019-11-09 DIAGNOSIS — R270 Ataxia, unspecified: Secondary | ICD-10-CM | POA: Diagnosis not present

## 2019-11-13 ENCOUNTER — Emergency Department (HOSPITAL_COMMUNITY): Payer: Medicare HMO

## 2019-11-13 ENCOUNTER — Encounter (HOSPITAL_COMMUNITY): Payer: Self-pay | Admitting: *Deleted

## 2019-11-13 ENCOUNTER — Telehealth: Payer: Self-pay | Admitting: Family Medicine

## 2019-11-13 ENCOUNTER — Emergency Department (HOSPITAL_COMMUNITY)
Admission: EM | Admit: 2019-11-13 | Discharge: 2019-11-13 | Disposition: A | Discharge status: Home / Self Care | Payer: Medicare HMO | Attending: Emergency Medicine | Admitting: Emergency Medicine

## 2019-11-13 ENCOUNTER — Other Ambulatory Visit: Payer: Self-pay

## 2019-11-13 DIAGNOSIS — J45909 Unspecified asthma, uncomplicated: Secondary | ICD-10-CM | POA: Diagnosis not present

## 2019-11-13 DIAGNOSIS — R2243 Localized swelling, mass and lump, lower limb, bilateral: Secondary | ICD-10-CM | POA: Diagnosis not present

## 2019-11-13 DIAGNOSIS — I10 Essential (primary) hypertension: Secondary | ICD-10-CM | POA: Insufficient documentation

## 2019-11-13 DIAGNOSIS — M25551 Pain in right hip: Secondary | ICD-10-CM | POA: Diagnosis not present

## 2019-11-13 DIAGNOSIS — Z96653 Presence of artificial knee joint, bilateral: Secondary | ICD-10-CM | POA: Diagnosis not present

## 2019-11-13 DIAGNOSIS — M542 Cervicalgia: Secondary | ICD-10-CM | POA: Diagnosis not present

## 2019-11-13 DIAGNOSIS — Z87891 Personal history of nicotine dependence: Secondary | ICD-10-CM | POA: Insufficient documentation

## 2019-11-13 DIAGNOSIS — M436 Torticollis: Secondary | ICD-10-CM | POA: Diagnosis not present

## 2019-11-13 DIAGNOSIS — M7989 Other specified soft tissue disorders: Secondary | ICD-10-CM | POA: Diagnosis not present

## 2019-11-13 DIAGNOSIS — M4802 Spinal stenosis, cervical region: Secondary | ICD-10-CM | POA: Diagnosis not present

## 2019-11-13 DIAGNOSIS — I6529 Occlusion and stenosis of unspecified carotid artery: Secondary | ICD-10-CM | POA: Diagnosis not present

## 2019-11-13 DIAGNOSIS — M4312 Spondylolisthesis, cervical region: Secondary | ICD-10-CM | POA: Diagnosis not present

## 2019-11-13 DIAGNOSIS — M47812 Spondylosis without myelopathy or radiculopathy, cervical region: Secondary | ICD-10-CM | POA: Diagnosis not present

## 2019-11-13 DIAGNOSIS — M1611 Unilateral primary osteoarthritis, right hip: Secondary | ICD-10-CM | POA: Diagnosis not present

## 2019-11-13 MED ORDER — HYDROMORPHONE HCL 1 MG/ML IJ SOLN
1.0000 mg | Freq: Once | INTRAMUSCULAR | Status: AC
  Administered 2019-11-13: 1 mg via INTRAMUSCULAR
  Filled 2019-11-13: qty 1

## 2019-11-13 MED ORDER — OXYCODONE-ACETAMINOPHEN 5-325 MG PO TABS: 1.0000 | ORAL_TABLET | Freq: Four times a day (QID) | ORAL | 0 refills | Status: DC | PRN

## 2019-11-13 MED ORDER — CYCLOBENZAPRINE HCL 10 MG PO TABS
10.0000 mg | ORAL_TABLET | Freq: Three times a day (TID) | ORAL | 0 refills | Status: DC | PRN
Start: 2019-11-13 — End: 2020-03-21

## 2019-11-13 NOTE — ED Provider Notes (Signed)
[facility] EMERGENCY DEPARTMENT Provider Note   CSN: [id number] Arrival date & time: 11/13/19  1611     History Chief Complaint  Patient presents with  . Torticollis  . Leg Swelling    CONRAD ZAJKOWSKI is a 86 y.o. male.  Patient complains of left lateral neck pain and also right hip pain  The history is provided by the patient. No language interpreter was used.  Hip Pain This is a new problem. The current episode started less than 1 hour ago. The problem occurs constantly. The problem has been gradually improving. Pertinent negatives include no chest pain, no abdominal pain and no headaches. Nothing aggravates the symptoms. Nothing relieves the symptoms. He has tried nothing for the symptoms. The treatment provided no relief.       Past Medical History:  Diagnosis Date  . Asthma   . Diverticulosis   . GERD (gastroesophageal reflux disease)   . Gout   . Heart murmur    Evaluation by a cardiologist years ago was reportedly negative  . HTN (hypertension)   . Osteoarthritis   . Reflux     Patient Active Problem List   Diagnosis Date Noted  . S/P total knee replacement, left 06/09/10 01/03/2018  . Chest pain 09/24/2016  . AAA (abdominal aortic aneurysm) ([place]) 09/24/2016  . Cognitive dysfunction 12/27/2014  . Hyperlipidemia 12/27/2014  . Gout 01/11/2014  . HTN (hypertension) 01/11/2014  . Anemia, iron deficiency 04/03/2013  . S/P total knee replacement, right 09/21/09 09/16/2010  . GASTROESOPHAGEAL REFLUX DISEASE 05/08/2010  . DIVERTICULOSIS, COLON 05/08/2010  . TOBACCO ABUSE 05/08/2010  . KNEE, ARTHRITIS, DEGEN./OSTEO 04/05/2008    Past Surgical History:  Procedure Laterality Date  . APPENDECTOMY  2007  . COLONOSCOPY  2003  . COLONOSCOPY N/A 12/28/2012   Procedure: COLONOSCOPY;  Surgeon: Rogene Houston, MD;  Location: [facility];  Service: Endoscopy;  Laterality: N/A;  255-rescheduled to 10:30 Ann notified pt  . EYE SURGERY Right    cataract removal  .  INGUINAL HERNIA REPAIR     Right  . JOINT REPLACEMENT     right total knee RIGHT total knee arthroplasty  . TOTAL KNEE ARTHROPLASTY  09/2009   Right       Family History  Problem Relation Age of Onset  . Hypertension Mother   . Hypertension Father   . Heart attack Father   . Cancer Brother        colon  . Arthritis Other   . Heart disease Other        Male < 55  . Uterine cancer Maternal Aunt     Social History   Tobacco Use  . Smoking status: Former Smoker    Types: Pipe    Start date: 12/04/1939  . Smokeless tobacco: Never Used  . Tobacco comment: quit smoking cigarettes in 1971  Substance Use Topics  . Alcohol use: No  . Drug use: No    Home Medications Prior to Admission medications   Medication Sig Start Date End Date Taking? Authorizing Provider  acetaminophen (TYLENOL) 650 MG CR tablet Take 650-1,300 mg by mouth every 8 (eight) hours as needed for pain.   Yes [provider]  amLODipine (NORVASC) 2.5 MG tablet Take 1 tablet (2.5 mg total) by mouth daily. 09/15/19  Yes [name], MD  docusate sodium (COLACE) 100 MG capsule Take 2 capsules (200 mg total) by mouth at bedtime. 12/28/12  Yes Rehman, Mechele Dawley, MD  famotidine (PEPCID) 20 MG  tablet TAKE (1) TABLET BY MOUTH TWICE DAILY. Patient taking differently: Take 20 mg by mouth 2 (two) times daily.  03/31/19  Yes [name], MD  potassium chloride (KLOR-CON) 10 MEQ tablet TAKE (1) TABLET when taking torsemide , take one Mon Wed and Friday Patient taking differently: Take 10 mEq by mouth every Monday, Wednesday, and Friday.  09/15/19  Yes [name], MD  rosuvastatin (CRESTOR) 20 MG tablet Take 1 tablet (20 mg total) by mouth daily. 09/15/19 12/14/19 Yes [name], MD  torsemide (DEMADEX) 20 MG tablet 1/2 q Mon Wed and Friday only Patient taking differently: Take 10 mg by mouth every Monday, Wednesday, and Friday.  05/24/19  Yes Luking, Elayne Snare, MD  VENTOLIN HFA 108 (90 Base) MCG/ACT inhaler  INHALE 2 PUFFS EVERY 4 HOURS AS NEEDED. Patient taking differently: Inhale 2 puffs into the lungs every 4 (four) hours as needed for wheezing or shortness of breath.  12/19/18  Yes [name], MD  cyclobenzaprine (FLEXERIL) 10 MG tablet Take 1 tablet (10 mg total) by mouth 3 (three) times daily as needed for muscle spasms. 11/13/19   Milton Ferguson, MD  oxyCODONE-acetaminophen (PERCOCET/ROXICET) 5-325 MG tablet Take 1 tablet by mouth every 6 (six) hours as needed. 11/13/19   Milton Ferguson, MD    Allergies    Penicillins  Review of Systems   Review of Systems  Constitutional: Negative for appetite change and fatigue.  HENT: Negative for congestion, ear discharge and sinus pressure.   Eyes: Negative for discharge.  Respiratory: Negative for cough.   Cardiovascular: Negative for chest pain.  Gastrointestinal: Negative for abdominal pain and diarrhea.  Genitourinary: Negative for frequency and hematuria.  Musculoskeletal: Negative for back pain.       Left lateral neck pain and right hip pain  Skin: Negative for rash.  Neurological: Negative for seizures and headaches.  Psychiatric/Behavioral: Negative for hallucinations.    Physical Exam Updated Vital Signs BP (!) 161/90   Pulse 81   Temp 99.8 F (37.7 C)   Resp 19   Ht 5\' 10"  (1.778 m)   Wt 77.1 kg   SpO2 95%   BMI 24.39 kg/m   Physical Exam Vitals and nursing note reviewed.  Constitutional:      Appearance: He is well-developed.  HENT:     Head: Normocephalic.     Nose: Nose normal.  Eyes:     General: No scleral icterus.    Conjunctiva/sclera: Conjunctivae normal.  Neck:     Thyroid: No thyromegaly.     Comments: Mild left lateral neck pain Cardiovascular:     Rate and Rhythm: Normal rate and regular rhythm.     Heart sounds: No murmur heard.  No friction rub. No gallop.   Pulmonary:     Breath sounds: No stridor. No wheezing or rales.  Chest:     Chest wall: No tenderness.  Abdominal:     General: There  is no distension.     Tenderness: There is no abdominal tenderness. There is no rebound.  Musculoskeletal:        General: Normal range of motion.     Cervical back: Neck supple.     Comments: Very mild hip discomfort  Lymphadenopathy:     Cervical: No cervical adenopathy.  Skin:    Findings: No erythema or rash.  Neurological:     Mental Status: He is alert and oriented to person, place, and time.     Motor: No abnormal  muscle tone.     Coordination: Coordination normal.  Psychiatric:        Behavior: Behavior normal.     ED Results / Procedures / Treatments   Labs (all labs ordered are listed, but only abnormal results are displayed) Labs Reviewed - No data to display  EKG None  Radiology CT Cervical Spine Wo Contrast  Result Date: 11/13/2019 CLINICAL DATA:  Cervical spine osteoarthritis EXAM: CT CERVICAL SPINE WITHOUT CONTRAST TECHNIQUE: Multidetector CT imaging of the cervical spine was performed without intravenous contrast. Multiplanar CT image reconstructions were also generated. COMPARISON:  None. FINDINGS: Alignment: Minimal retrolisthesis of C4 on C5 is seen. Skull base and vertebrae: Visualized skull base is intact. No atlanto-occipital dissociation. The vertebral body heights are well maintained. No fracture or pathologic osseous lesion seen. Soft tissues and spinal canal: The visualized paraspinal soft tissues are unremarkable. No prevertebral soft tissue swelling is seen. The spinal canal is grossly unremarkable, no large epidural collection or significant canal narrowing. Disc levels:  Multilevel cervical spine spondylosis is seen. C1-C2: Atlanto-axial junction is normal, without canal narrowing C2-C3: There is disc osteophyte complex and uncovertebral osteophytes which causes moderate right and mild left neural foraminal narrowing and mild central canal stenosis. C3-C4: There is disc osteophyte complex and uncovertebral osteophytes which causes moderate to severe right  and moderate left neural foraminal narrowing. There is mild central canal stenosis. C4-C5: Disc osteophyte complex and uncovertebral osteophytes are noted with partial calcification of the posterior longitudinal ligament. There is severe left and moderate to severe right neural foraminal narrowing as well as moderate to severe central canal stenosis. C5-C6: Disc osteophyte complex and uncovertebral osteophytes are present which causes moderate bilateral neural foraminal narrowing. C6-C7: Disc osteophyte complex and uncovertebral osteophytes are present which causes moderate to severe bilateral neural foraminal narrowing and mild central canal stenosis. C7-T1: No significant spinal canal or neural foraminal narrowing Upper chest: The lung apices are clear. Thoracic inlet is within normal limits. Other: Carotid artery calcifications are seen. IMPRESSION: Minimal retrolisthesis of C4 on C5. Cervical spine spondylosis most notable at C4-C5 with severe left neural foraminal narrowing and moderate to severe central canal stenosis. Electronically Signed   By: Prudencio Pair M.D.   On: 11/13/2019 20:46   DG Hip Unilat W or Wo Pelvis 2-3 Views Right  Result Date: 11/13/2019 CLINICAL DATA:  Right hip pain, swelling EXAM: DG HIP (WITH OR WITHOUT PELVIS) 2-3V RIGHT COMPARISON:  None. FINDINGS: Frontal view of the pelvis as well as frontal and frogleg lateral views of the right hip are obtained. No fracture, subluxation, or dislocation within either hip. Mild symmetrical osteoarthritis of the hips. Sacroiliac joints are normal. The remainder of the bony pelvis is unremarkable. IMPRESSION: 1. Mild symmetrical hip osteoarthritis.  No acute fracture. Electronically Signed   By: Randa Ngo M.D.   On: 11/13/2019 21:01    Procedures Procedures (including critical care time)  Medications Ordered in ED Medications  HYDROmorphone (DILAUDID) injection 1 mg (1 mg Intramuscular Given 11/13/19 2047)    ED Course  I have  reviewed the triage vital signs and the nursing notes.  Pertinent labs & imaging results that were available during my care of the patient were reviewed by me and considered in my medical decision making (see chart for details).    MDM Rules/Calculators/A&P                          Patient with torticollis and right  hip osteoarthritis.  He will be given a muscle relaxer and pain medicine will follow up with PCP        This patient presents to the ED for concern of neck pain and hip pain, this involves an extensive number of treatment options, and is a complaint that carries with it a high risk of complications and morbidity.  The differential diagnosis includes musculoskeletal problem   Lab Tests:     Medicines ordered:   I ordered medication Dilaudid for pain  Imaging Studies ordered:   I ordered imaging studies which included CT cervical spine and plain films of right hip  I independently visualized and interpreted imaging which showed osteoarthritis of right hip and spondylosis in the cervical spine  Additional history obtained:   Additional history obtained from records  Previous records obtained and reviewed.  Consultations Obtained:     Reevaluation:  After the interventions stated above, I reevaluated the patient and found improved  Critical Interventions:  .   Final Clinical Impression(s) / ED Diagnoses Final diagnoses:  Torticollis    Rx / DC Orders ED Discharge Orders         Ordered    cyclobenzaprine (FLEXERIL) 10 MG tablet  3 times daily PRN        11/13/19 2118    oxyCODONE-acetaminophen (PERCOCET/ROXICET) 5-325 MG tablet  Every 6 hours PRN        11/13/19 2118           Milton Ferguson, MD 11/16/19 1008

## 2019-11-13 NOTE — Telephone Encounter (Signed)
Ava calling to say that her dad's behavior is getting worse.  He didn't know where he was this weekend and is "talking out of his head".  She wanted the nurse to call Murray Hodgkins to get more details.

## 2019-11-13 NOTE — Discharge Instructions (Addendum)
Follow-up with your doctor later this week for recheck. °

## 2019-11-13 NOTE — Telephone Encounter (Signed)
FYI: Patient is currently in ER at [facility]

## 2019-11-13 NOTE — Telephone Encounter (Signed)
Telephone call- Voicemail full for [facility] Left message to return call for [name]

## 2019-11-13 NOTE — ED Triage Notes (Signed)
Intermittent pain in left side of neck, also c/o swelling both legs, states his fluid pill dose has been cut in half

## 2019-11-13 NOTE — ED Notes (Signed)
Patient in xray 

## 2019-11-15 ENCOUNTER — Emergency Department (HOSPITAL_COMMUNITY): Payer: Medicare HMO

## 2019-11-15 ENCOUNTER — Telehealth: Payer: Self-pay | Admitting: *Deleted

## 2019-11-15 ENCOUNTER — Other Ambulatory Visit (HOSPITAL_COMMUNITY): Payer: Medicare HMO

## 2019-11-15 ENCOUNTER — Other Ambulatory Visit: Payer: Self-pay

## 2019-11-15 ENCOUNTER — Other Ambulatory Visit (HOSPITAL_COMMUNITY): Payer: Self-pay | Admitting: Radiology

## 2019-11-15 ENCOUNTER — Encounter (HOSPITAL_COMMUNITY): Payer: Self-pay

## 2019-11-15 ENCOUNTER — Emergency Department (HOSPITAL_COMMUNITY)
Admission: EM | Admit: 2019-11-15 | Discharge: 2019-11-15 | Disposition: A | Discharge status: Home / Self Care | Payer: Medicare HMO | Attending: Emergency Medicine | Admitting: Emergency Medicine

## 2019-11-15 DIAGNOSIS — R4182 Altered mental status, unspecified: Secondary | ICD-10-CM | POA: Insufficient documentation

## 2019-11-15 DIAGNOSIS — J45909 Unspecified asthma, uncomplicated: Secondary | ICD-10-CM | POA: Insufficient documentation

## 2019-11-15 DIAGNOSIS — Z79899 Other long term (current) drug therapy: Secondary | ICD-10-CM | POA: Diagnosis not present

## 2019-11-15 DIAGNOSIS — M4802 Spinal stenosis, cervical region: Secondary | ICD-10-CM | POA: Diagnosis not present

## 2019-11-15 DIAGNOSIS — M47812 Spondylosis without myelopathy or radiculopathy, cervical region: Secondary | ICD-10-CM | POA: Diagnosis not present

## 2019-11-15 DIAGNOSIS — Z20822 Contact with and (suspected) exposure to covid-19: Secondary | ICD-10-CM | POA: Insufficient documentation

## 2019-11-15 DIAGNOSIS — G9389 Other specified disorders of brain: Secondary | ICD-10-CM | POA: Diagnosis not present

## 2019-11-15 DIAGNOSIS — I10 Essential (primary) hypertension: Secondary | ICD-10-CM | POA: Insufficient documentation

## 2019-11-15 DIAGNOSIS — Z96653 Presence of artificial knee joint, bilateral: Secondary | ICD-10-CM | POA: Diagnosis not present

## 2019-11-15 DIAGNOSIS — S0990XA Unspecified injury of head, initial encounter: Secondary | ICD-10-CM | POA: Diagnosis not present

## 2019-11-15 DIAGNOSIS — W19XXXA Unspecified fall, initial encounter: Secondary | ICD-10-CM

## 2019-11-15 DIAGNOSIS — R519 Headache, unspecified: Secondary | ICD-10-CM | POA: Diagnosis not present

## 2019-11-15 DIAGNOSIS — R410 Disorientation, unspecified: Secondary | ICD-10-CM | POA: Diagnosis not present

## 2019-11-15 DIAGNOSIS — I6529 Occlusion and stenosis of unspecified carotid artery: Secondary | ICD-10-CM | POA: Diagnosis not present

## 2019-11-15 DIAGNOSIS — M47816 Spondylosis without myelopathy or radiculopathy, lumbar region: Secondary | ICD-10-CM | POA: Diagnosis not present

## 2019-11-15 DIAGNOSIS — Z87891 Personal history of nicotine dependence: Secondary | ICD-10-CM | POA: Insufficient documentation

## 2019-11-15 DIAGNOSIS — M160 Bilateral primary osteoarthritis of hip: Secondary | ICD-10-CM | POA: Diagnosis not present

## 2019-11-15 DIAGNOSIS — I517 Cardiomegaly: Secondary | ICD-10-CM | POA: Diagnosis not present

## 2019-11-15 DIAGNOSIS — S199XXA Unspecified injury of neck, initial encounter: Secondary | ICD-10-CM | POA: Diagnosis not present

## 2019-11-15 DIAGNOSIS — H748X1 Other specified disorders of right middle ear and mastoid: Secondary | ICD-10-CM | POA: Diagnosis not present

## 2019-11-15 DIAGNOSIS — R531 Weakness: Secondary | ICD-10-CM | POA: Diagnosis not present

## 2019-11-15 LAB — CBC WITH DIFFERENTIAL/PLATELET
Abs Immature Granulocytes: 0.03 10*3/uL (ref 0.00–0.07)
Basophils Absolute: 0 10*3/uL (ref 0.0–0.1)
Basophils Relative: 1 %
Eosinophils Absolute: 0.1 10*3/uL (ref 0.0–0.5)
Eosinophils Relative: 1 %
HCT: 35.5 % — ABNORMAL LOW (ref 39.0–52.0)
Hemoglobin: 10.9 g/dL — ABNORMAL LOW (ref 13.0–17.0)
Immature Granulocytes: 0 %
Lymphocytes Relative: 8 %
Lymphs Abs: 0.7 10*3/uL (ref 0.7–4.0)
MCH: 31.4 pg (ref 26.0–34.0)
MCHC: 30.7 g/dL (ref 30.0–36.0)
MCV: 102.3 fL — ABNORMAL HIGH (ref 80.0–100.0)
Monocytes Absolute: 1.2 10*3/uL — ABNORMAL HIGH (ref 0.1–1.0)
Monocytes Relative: 15 %
Neutro Abs: 6.1 10*3/uL (ref 1.7–7.7)
Neutrophils Relative %: 75 %
Platelets: 138 10*3/uL — ABNORMAL LOW (ref 150–400)
RBC: 3.47 MIL/uL — ABNORMAL LOW (ref 4.22–5.81)
RDW: 12 % (ref 11.5–15.5)
WBC: 8.1 10*3/uL (ref 4.0–10.5)
nRBC: 0 % (ref 0.0–0.2)

## 2019-11-15 LAB — URINALYSIS, ROUTINE W REFLEX MICROSCOPIC
Bacteria, UA: NONE SEEN
Bilirubin Urine: NEGATIVE
Glucose, UA: NEGATIVE mg/dL
Ketones, ur: NEGATIVE mg/dL
Leukocytes,Ua: NEGATIVE
Nitrite: NEGATIVE
Protein, ur: NEGATIVE mg/dL
Specific Gravity, Urine: 1.006 (ref 1.005–1.030)
pH: 5 (ref 5.0–8.0)

## 2019-11-15 LAB — COMPREHENSIVE METABOLIC PANEL
ALT: 12 U/L (ref 0–44)
AST: 22 U/L (ref 15–41)
Albumin: 4 g/dL (ref 3.5–5.0)
Alkaline Phosphatase: 46 U/L (ref 38–126)
Anion gap: 16 — ABNORMAL HIGH (ref 5–15)
BUN: 15 mg/dL (ref 8–23)
CO2: 27 mmol/L (ref 22–32)
Calcium: 9.1 mg/dL (ref 8.9–10.3)
Chloride: 95 mmol/L — ABNORMAL LOW (ref 98–111)
Creatinine, Ser: 1.36 mg/dL — ABNORMAL HIGH (ref 0.61–1.24)
GFR calc Af Amer: 52 mL/min — ABNORMAL LOW (ref >60)
GFR calc non Af Amer: 45 mL/min — ABNORMAL LOW (ref >60)
Glucose, Bld: 97 mg/dL (ref 70–99)
Potassium: 2.9 mmol/L — ABNORMAL LOW (ref 3.5–5.1)
Sodium: 138 mmol/L (ref 135–145)
Total Bilirubin: 2.8 mg/dL — ABNORMAL HIGH (ref 0.3–1.2)
Total Protein: 8.3 g/dL — ABNORMAL HIGH (ref 6.5–8.1)

## 2019-11-15 LAB — SARS CORONAVIRUS 2 BY RT PCR (HOSPITAL ORDER, PERFORMED IN ~~LOC~~ HOSPITAL LAB): SARS Coronavirus 2: NEGATIVE

## 2019-11-15 NOTE — Telephone Encounter (Signed)
Patient's daughter called and stated patient was seen in the ER for falls and confusion and is getting worse-Patient fell 4 times yesterday and hit his head and talking out of his head. Advised daughter to take patient immediately back to Er for evaluation and treatment. Daughter agreed.

## 2019-11-15 NOTE — ED Triage Notes (Signed)
Pt brought to ED by family for fall and altered mental status, symptoms started on Monday. Daughter states he has been complaining of his head hurting since falling Monday, denies LOC, denies blood thinners.

## 2019-11-15 NOTE — ED Provider Notes (Signed)
[facility] EMERGENCY DEPARTMENT Provider Note   CSN: [id number] Arrival date & time: 11/15/19  1439     History Chief Complaint  Patient presents with  . Altered Mental Status    [name] is a 86 y.o. male.  Patient has been more confused the last few days he fell on Monday and hit his head also fell yesterday 3 times.  The history is provided by the patient and a relative.  Altered Mental Status Presenting symptoms: confusion   Severity:  Mild Most recent episode:  2 days ago Episode history:  Continuous Timing:  Constant Progression:  Unchanged Chronicity:  New Context: not alcohol use   Associated symptoms: no abdominal pain, no hallucinations, no headaches, no rash and no seizures        Past Medical History:  Diagnosis Date  . Asthma   . Diverticulosis   . GERD (gastroesophageal reflux disease)   . Gout   . Heart murmur    Evaluation by a cardiologist years ago was reportedly negative  . HTN (hypertension)   . Osteoarthritis   . Reflux     Patient Active Problem List   Diagnosis Date Noted  . S/P total knee replacement, left 06/09/10 01/03/2018  . Chest pain 09/24/2016  . AAA (abdominal aortic aneurysm) ([place]) 09/24/2016  . Cognitive dysfunction 12/27/2014  . Hyperlipidemia 12/27/2014  . Gout 01/11/2014  . HTN (hypertension) 01/11/2014  . Anemia, iron deficiency 04/03/2013  . S/P total knee replacement, right 09/21/09 09/16/2010  . GASTROESOPHAGEAL REFLUX DISEASE 05/08/2010  . DIVERTICULOSIS, COLON 05/08/2010  . TOBACCO ABUSE 05/08/2010  . KNEE, ARTHRITIS, DEGEN./OSTEO 04/05/2008    Past Surgical History:  Procedure Laterality Date  . APPENDECTOMY  2007  . COLONOSCOPY  2003  . COLONOSCOPY N/A 12/28/2012   Procedure: COLONOSCOPY;  Surgeon: Rogene Houston, MD;  Location: [facility] SUITE;  Service: Endoscopy;  Laterality: N/A;  255-rescheduled to 10:30 Ann notified pt  . EYE SURGERY Right    cataract removal  . INGUINAL HERNIA REPAIR      Right  . JOINT REPLACEMENT     right total knee RIGHT total knee arthroplasty  . TOTAL KNEE ARTHROPLASTY  09/2009   Right       Family History  Problem Relation Age of Onset  . Hypertension Mother   . Hypertension Father   . Heart attack Father   . Cancer Brother        colon  . Arthritis Other   . Heart disease Other        Male < 55  . Uterine cancer Maternal Aunt     Social History   Tobacco Use  . Smoking status: Former Smoker    Types: Pipe    Start date: 12/04/1939  . Smokeless tobacco: Never Used  . Tobacco comment: quit smoking cigarettes in 1971  Substance Use Topics  . Alcohol use: No  . Drug use: No    Home Medications Prior to Admission medications   Medication Sig Start Date End Date Taking? Authorizing Provider  acetaminophen (TYLENOL) 650 MG CR tablet Take 650-1,300 mg by mouth every 8 (eight) hours as needed for pain.    [provider]  amLODipine (NORVASC) 2.5 MG tablet Take 1 tablet (2.5 mg total) by mouth daily. 09/15/19   [name], MD  cyclobenzaprine (FLEXERIL) 10 MG tablet Take 1 tablet (10 mg total) by mouth 3 (three) times daily as needed for muscle spasms. 11/13/19   Milton Ferguson, MD  docusate sodium (COLACE) 100 MG capsule Take 2 capsules (200 mg total) by mouth at bedtime. 12/28/12   Rehman, Mechele Dawley, MD  famotidine (PEPCID) 20 MG tablet TAKE (1) TABLET BY MOUTH TWICE DAILY. Patient taking differently: Take 20 mg by mouth 2 (two) times daily.  03/31/19   [name], MD  oxyCODONE-acetaminophen (PERCOCET/ROXICET) 5-325 MG tablet Take 1 tablet by mouth every 6 (six) hours as needed. 11/13/19   Milton Ferguson, MD  potassium chloride (KLOR-CON) 10 MEQ tablet TAKE (1) TABLET when taking torsemide , take one Mon Wed and Friday Patient taking differently: Take 10 mEq by mouth every Monday, Wednesday, and Friday.  09/15/19   [name], MD  rosuvastatin (CRESTOR) 20 MG tablet Take 1 tablet (20 mg total) by mouth daily. 09/15/19  12/14/19  [name], MD  torsemide (DEMADEX) 20 MG tablet 1/2 q Mon Wed and Friday only Patient taking differently: Take 10 mg by mouth every Monday, Wednesday, and Friday.  05/24/19   Luking, Elayne Snare, MD  VENTOLIN HFA 108 (90 Base) MCG/ACT inhaler INHALE 2 PUFFS EVERY 4 HOURS AS NEEDED. Patient taking differently: Inhale 2 puffs into the lungs every 4 (four) hours as needed for wheezing or shortness of breath.  12/19/18   [name], MD    Allergies    Penicillins  Review of Systems   Review of Systems  Constitutional: Negative for appetite change and fatigue.  HENT: Negative for congestion, ear discharge and sinus pressure.   Eyes: Negative for discharge.  Respiratory: Negative for cough.   Cardiovascular: Negative for chest pain.  Gastrointestinal: Negative for abdominal pain and diarrhea.  Genitourinary: Negative for frequency and hematuria.  Musculoskeletal: Negative for back pain.  Skin: Negative for rash.  Neurological: Negative for seizures and headaches.  Psychiatric/Behavioral: Positive for confusion. Negative for hallucinations.    Physical Exam Updated Vital Signs BP (!) 142/85 (BP Location: Right Arm)   Pulse 79   Temp 99 F (37.2 C) (Oral)   Resp 18   Ht 5\' 10"  (1.778 m)   Wt 77.1 kg   SpO2 97%   BMI 24.39 kg/m   Physical Exam Vitals and nursing note reviewed.  Constitutional:      Appearance: He is well-developed.  HENT:     Head: Normocephalic.     Comments: Tender occipital head Eyes:     General: No scleral icterus.    Conjunctiva/sclera: Conjunctivae normal.  Neck:     Thyroid: No thyromegaly.  Cardiovascular:     Rate and Rhythm: Normal rate and regular rhythm.     Heart sounds: No murmur heard.  No friction rub. No gallop.   Pulmonary:     Breath sounds: No stridor. No wheezing or rales.  Chest:     Chest wall: No tenderness.  Abdominal:     General: There is no distension.     Tenderness: There is no abdominal tenderness. There  is no rebound.  Musculoskeletal:        General: Normal range of motion.     Cervical back: Neck supple.  Lymphadenopathy:     Cervical: No cervical adenopathy.  Skin:    Findings: No erythema or rash.  Neurological:     Mental Status: He is oriented to person, place, and time.     Motor: No abnormal muscle tone.     Coordination: Coordination normal.  Psychiatric:        Behavior: Behavior normal.     ED Results /  Procedures / Treatments   Labs (all labs ordered are listed, but only abnormal results are displayed) Labs Reviewed  CBC WITH DIFFERENTIAL/PLATELET - Abnormal; Notable for the following components:      Result Value   RBC 3.47 (*)    Hemoglobin 10.9 (*)    HCT 35.5 (*)    MCV 102.3 (*)    Platelets 138 (*)    Monocytes Absolute 1.2 (*)    All other components within normal limits  COMPREHENSIVE METABOLIC PANEL - Abnormal; Notable for the following components:   Potassium 2.9 (*)    Chloride 95 (*)    Creatinine, Ser 1.36 (*)    Total Protein 8.3 (*)    Total Bilirubin 2.8 (*)    GFR calc non Af Amer 45 (*)    GFR calc Af Amer 52 (*)    Anion gap 16 (*)    All other components within normal limits  SARS CORONAVIRUS 2 BY RT PCR (HOSPITAL ORDER, [place] [facility])  URINALYSIS, ROUTINE W REFLEX MICROSCOPIC    EKG None  Radiology CT Head Wo Contrast  Result Date: 11/15/2019 CLINICAL DATA:  Fall Monday and again today, denies head strike but pain with pain at the base of skull and posterior neck EXAM: CT HEAD WITHOUT CONTRAST CT CERVICAL SPINE WITHOUT CONTRAST TECHNIQUE: Multidetector CT imaging of the head and cervical spine was performed following the standard protocol without intravenous contrast. Multiplanar CT image reconstructions of the cervical spine were also generated. COMPARISON:  MRI head 11/09/2019, CT cervical spine 11/13/2019 FINDINGS: CT HEAD FINDINGS Brain: No evidence of acute infarction, hemorrhage, hydrocephalus,  extra-axial collection or mass lesion/mass effect. Symmetric prominence of the ventricles, cisterns and sulci compatible with parenchymal volume loss. Patchy areas of white matter hypoattenuation are most compatible with chronic microvascular angiopathy. Benign dural calcifications. Midline intracranial structures are unremarkable. Cerebellar tonsils are normally positioned. Vascular: Atherosclerotic calcification of the carotid siphons and intradural vertebral arteries. No hyperdense vessel. Skull: No significant scalp swelling or hematoma. No calvarial fracture or acute osseous injury. No suspicious osseous lesions. Sinuses/Orbits: Trace right mastoid effusion. Paranasal sinuses mastoid air cells are otherwise predominantly clear. Prior right lens extraction. Included orbital structures are otherwise unremarkable. Other: None CT CERVICAL SPINE FINDINGS Alignment: Cervical stabilization collar is absent at the time of examination. There is straightening of the upper cervical lordosis. Retrolisthesis C4 on C5 is unchanged from comparison two days prior. Favored to be on a degenerative, spondylitic and facet degenerative basis. No evidence of traumatic listhesis. No abnormally widened, perched or jumped facets. Normal alignment of the craniocervical and atlantoaxial articulations. Skull base and vertebrae: No acute skull base fracture. No vertebral body fracture or height loss. Sclerotic changes C3-C4 and C5 as well as involving the endplates at the Q1-1 levels are favored to be on a Modic type degenerative basis. Some larger osteophytic spurring is noted at C3-4 including a remote corticated fractured osteophyte anteriorly. No acute or worrisome osseous lesions. Soft tissues and spinal canal: No pre or paravertebral fluid or swelling. No visible canal hematoma. Disc levels: Moderate arthrosis and spurring about the atlantodental interval. No canal stenosis. Multilevel disc osteophyte complexes are present throughout  the cervical spine as well as extensive hypertrophic facet degenerative changes and uncinate spurring. These findings are extensively detailed level by level on outpatient cervical spine CT from 2 days prior. In brief, there is multilevel mild-to-moderate spinal canal and foraminal stenoses throughout the cervical spine with more severe central canal stenosis  secondary to a large osteophyte and retrolisthesis present at C4-5. Upper chest: No acute abnormality in the upper chest or imaged lung apices. Other: Normal thyroid. Cervical carotid atherosclerosis is noted. Questionable nodularity involving the left lateral aspect of the cervical esophagus (4/73) with a similar appearance on comparison examination. Should consider direct visualization. IMPRESSION: 1. No acute intracranial abnormality. No significant scalp swelling or calvarial fracture. 2. Stable parenchymal volume loss and chronic microvascular ischemic white matter disease. 3. No acute fracture or traumatic listhesis of the cervical spine. 4. Multilevel disc osteophyte complexes and hypertrophic facet degenerative changes and uncinate spurring resulting in multilevel canal stenosis and foraminal narrowing. These findings are extensively detailed level by level on outpatient cervical spine CT from 2 days prior. Maximal findings at C4-5 with more severe canal stenosis and moderate bilateral foraminal narrowing. 5. Questionable nodularity involving the left lateral aspect of the cervical esophagus with a similarly conspicuous appearance on comparison examination. Could consider direct visualization. 6. Cervical and intracranial atherosclerosis. Electronically Signed   By: Lovena Le M.D.   On: 11/15/2019 16:57   CT Cervical Spine Wo Contrast  Result Date: 11/15/2019 CLINICAL DATA:  Fall Monday and again today, denies head strike but pain with pain at the base of skull and posterior neck EXAM: CT HEAD WITHOUT CONTRAST CT CERVICAL SPINE WITHOUT CONTRAST  TECHNIQUE: Multidetector CT imaging of the head and cervical spine was performed following the standard protocol without intravenous contrast. Multiplanar CT image reconstructions of the cervical spine were also generated. COMPARISON:  MRI head 11/09/2019, CT cervical spine 11/13/2019 FINDINGS: CT HEAD FINDINGS Brain: No evidence of acute infarction, hemorrhage, hydrocephalus, extra-axial collection or mass lesion/mass effect. Symmetric prominence of the ventricles, cisterns and sulci compatible with parenchymal volume loss. Patchy areas of white matter hypoattenuation are most compatible with chronic microvascular angiopathy. Benign dural calcifications. Midline intracranial structures are unremarkable. Cerebellar tonsils are normally positioned. Vascular: Atherosclerotic calcification of the carotid siphons and intradural vertebral arteries. No hyperdense vessel. Skull: No significant scalp swelling or hematoma. No calvarial fracture or acute osseous injury. No suspicious osseous lesions. Sinuses/Orbits: Trace right mastoid effusion. Paranasal sinuses mastoid air cells are otherwise predominantly clear. Prior right lens extraction. Included orbital structures are otherwise unremarkable. Other: None CT CERVICAL SPINE FINDINGS Alignment: Cervical stabilization collar is absent at the time of examination. There is straightening of the upper cervical lordosis. Retrolisthesis C4 on C5 is unchanged from comparison two days prior. Favored to be on a degenerative, spondylitic and facet degenerative basis. No evidence of traumatic listhesis. No abnormally widened, perched or jumped facets. Normal alignment of the craniocervical and atlantoaxial articulations. Skull base and vertebrae: No acute skull base fracture. No vertebral body fracture or height loss. Sclerotic changes C3-C4 and C5 as well as involving the endplates at the H4-1 levels are favored to be on a Modic type degenerative basis. Some larger osteophytic  spurring is noted at C3-4 including a remote corticated fractured osteophyte anteriorly. No acute or worrisome osseous lesions. Soft tissues and spinal canal: No pre or paravertebral fluid or swelling. No visible canal hematoma. Disc levels: Moderate arthrosis and spurring about the atlantodental interval. No canal stenosis. Multilevel disc osteophyte complexes are present throughout the cervical spine as well as extensive hypertrophic facet degenerative changes and uncinate spurring. These findings are extensively detailed level by level on outpatient cervical spine CT from 2 days prior. In brief, there is multilevel mild-to-moderate spinal canal and foraminal stenoses throughout the cervical spine with more severe central canal stenosis  secondary to a large osteophyte and retrolisthesis present at C4-5. Upper chest: No acute abnormality in the upper chest or imaged lung apices. Other: Normal thyroid. Cervical carotid atherosclerosis is noted. Questionable nodularity involving the left lateral aspect of the cervical esophagus (4/73) with a similar appearance on comparison examination. Should consider direct visualization. IMPRESSION: 1. No acute intracranial abnormality. No significant scalp swelling or calvarial fracture. 2. Stable parenchymal volume loss and chronic microvascular ischemic white matter disease. 3. No acute fracture or traumatic listhesis of the cervical spine. 4. Multilevel disc osteophyte complexes and hypertrophic facet degenerative changes and uncinate spurring resulting in multilevel canal stenosis and foraminal narrowing. These findings are extensively detailed level by level on outpatient cervical spine CT from 2 days prior. Maximal findings at C4-5 with more severe canal stenosis and moderate bilateral foraminal narrowing. 5. Questionable nodularity involving the left lateral aspect of the cervical esophagus with a similarly conspicuous appearance on comparison examination. Could consider  direct visualization. 6. Cervical and intracranial atherosclerosis. Electronically Signed   By: Lovena Le M.D.   On: 11/15/2019 16:57   CT Cervical Spine Wo Contrast  Result Date: 11/13/2019 CLINICAL DATA:  Cervical spine osteoarthritis EXAM: CT CERVICAL SPINE WITHOUT CONTRAST TECHNIQUE: Multidetector CT imaging of the cervical spine was performed without intravenous contrast. Multiplanar CT image reconstructions were also generated. COMPARISON:  None. FINDINGS: Alignment: Minimal retrolisthesis of C4 on C5 is seen. Skull base and vertebrae: Visualized skull base is intact. No atlanto-occipital dissociation. The vertebral body heights are well maintained. No fracture or pathologic osseous lesion seen. Soft tissues and spinal canal: The visualized paraspinal soft tissues are unremarkable. No prevertebral soft tissue swelling is seen. The spinal canal is grossly unremarkable, no large epidural collection or significant canal narrowing. Disc levels:  Multilevel cervical spine spondylosis is seen. C1-C2: Atlanto-axial junction is normal, without canal narrowing C2-C3: There is disc osteophyte complex and uncovertebral osteophytes which causes moderate right and mild left neural foraminal narrowing and mild central canal stenosis. C3-C4: There is disc osteophyte complex and uncovertebral osteophytes which causes moderate to severe right and moderate left neural foraminal narrowing. There is mild central canal stenosis. C4-C5: Disc osteophyte complex and uncovertebral osteophytes are noted with partial calcification of the posterior longitudinal ligament. There is severe left and moderate to severe right neural foraminal narrowing as well as moderate to severe central canal stenosis. C5-C6: Disc osteophyte complex and uncovertebral osteophytes are present which causes moderate bilateral neural foraminal narrowing. C6-C7: Disc osteophyte complex and uncovertebral osteophytes are present which causes moderate to  severe bilateral neural foraminal narrowing and mild central canal stenosis. C7-T1: No significant spinal canal or neural foraminal narrowing Upper chest: The lung apices are clear. Thoracic inlet is within normal limits. Other: Carotid artery calcifications are seen. IMPRESSION: Minimal retrolisthesis of C4 on C5. Cervical spine spondylosis most notable at C4-C5 with severe left neural foraminal narrowing and moderate to severe central canal stenosis. Electronically Signed   By: Prudencio Pair M.D.   On: 11/13/2019 20:46   DG Pelvis Portable  Result Date: 11/15/2019 CLINICAL DATA:  Weakness. EXAM: PORTABLE PELVIS 1-2 VIEWS COMPARISON:  None. FINDINGS: There is no evidence of acute pelvic fracture or diastasis. Mild degenerative changes are seen involving the bilateral hips and visualized portion of the lower lumbar spine. No pelvic bone lesions are seen. IMPRESSION: No acute findings. Electronically Signed   By: Virgina Norfolk M.D.   On: 11/15/2019 19:31   DG Chest Port 1 View  Result Date: 11/15/2019  CLINICAL DATA:  86 year old with acute mental status changes and generalized weakness that began 2 days ago. Patient fell 2 days ago and complains of a headache. EXAM: PORTABLE CHEST 1 VIEW COMPARISON:  09/24/2016 and earlier. FINDINGS: Cardiac silhouette mildly enlarged for AP portable technique, unchanged. Thoracic aorta tortuous and mildly atherosclerotic, unchanged. Minimal linear scarring at the RIGHT lung base, unchanged lungs otherwise clear. Bronchovascular markings normal. Pulmonary vascularity normal. No visible pleural effusions. No pneumothorax. IMPRESSION: Stable mild cardiomegaly. No acute cardiopulmonary disease. Electronically Signed   By: Evangeline Dakin M.D.   On: 11/15/2019 19:36   DG Hip Unilat W or Wo Pelvis 2-3 Views Right  Result Date: 11/13/2019 CLINICAL DATA:  Right hip pain, swelling EXAM: DG HIP (WITH OR WITHOUT PELVIS) 2-3V RIGHT COMPARISON:  None. FINDINGS: Frontal view of the  pelvis as well as frontal and frogleg lateral views of the right hip are obtained. No fracture, subluxation, or dislocation within either hip. Mild symmetrical osteoarthritis of the hips. Sacroiliac joints are normal. The remainder of the bony pelvis is unremarkable. IMPRESSION: 1. Mild symmetrical hip osteoarthritis.  No acute fracture. Electronically Signed   By: Randa Ngo M.D.   On: 11/13/2019 21:01    Procedures Procedures (including critical care time)  Medications Ordered in ED Medications - No data to display  ED Course  I have reviewed the triage vital signs and the nursing notes.  Pertinent labs & imaging results that were available during my care of the patient were reviewed by me and considered in my medical decision making (see chart for details).    MDM Rules/Calculators/A&P                         Pt has hypokalenia and mild confusion.  Pt left ama.  I tried to convince him to stay but he was not interested.      This patient presents to the ED for concern of fall this involves an extensive number of treatment options, and is a complaint that carries with it a high risk of complications and morbidity.  The differential diagnosis includes head injury   Lab Tests:   I Ordered, reviewed, and interpreted labs, which included CBC chemistries showed anemia and hypokalemia  Medicines ordered:     Imaging Studies ordered:   I ordered imaging studies which included CT head  I independently visualized and interpreted imaging which showed no acute disease  Additional history obtained:   Additional history obtained from daughter  Previous records obtained and reviewed.  Consultations Obtained:     Reevaluation:  After the interventions stated above, I reevaluated the patient and found unchanged  Critical Interventions:  .    Final Clinical Impression(s) / ED Diagnoses Final diagnoses:  None    Rx / DC Orders ED Discharge Orders    None         Milton Ferguson, MD 11/16/19 1044

## 2019-11-16 ENCOUNTER — Encounter: Payer: Self-pay | Admitting: Family Medicine

## 2019-11-16 ENCOUNTER — Ambulatory Visit (INDEPENDENT_AMBULATORY_CARE_PROVIDER_SITE_OTHER): Payer: Medicare HMO | Admitting: Family Medicine

## 2019-11-16 VITALS — BP 110/70 | HR 92 | Temp 97.7°F | Ht 70.0 in | Wt 167.0 lb

## 2019-11-16 DIAGNOSIS — F03911 Unspecified dementia, unspecified severity, with agitation: Secondary | ICD-10-CM

## 2019-11-16 DIAGNOSIS — R4189 Other symptoms and signs involving cognitive functions and awareness: Secondary | ICD-10-CM

## 2019-11-16 DIAGNOSIS — F0391 Unspecified dementia with behavioral disturbance: Secondary | ICD-10-CM | POA: Diagnosis not present

## 2019-11-16 DIAGNOSIS — R600 Localized edema: Secondary | ICD-10-CM | POA: Diagnosis not present

## 2019-11-16 MED ORDER — CLONAZEPAM 0.5 MG PO TABS: ORAL_TABLET | ORAL | 1 refills | Status: DC

## 2019-11-16 NOTE — Progress Notes (Signed)
   Subjective:    Patient ID: [name], male    DOB: September 17, 1925, 86 y.o.   MRN: [id number]  HPIconfusion for a few months.  Patient is having times where in the evening he seems to sometimes get a little bit disoriented Other times he gets it in his brain what she wants to do and its very difficult to convince him to do otherwise At times he does get a little more confused than what he normally does  Fell 4 times since coming home from hospital. Pt states no injuries except knot on back of head.  Patient having some weakness in his leg also having some back pain.  Patient at times with some ataxia.  Has had a recent CT scan in the neck of the head as well as MRI of the brain.  Would like a stool softner since he is taking oxycodone. Took milk of magnessia this morning.   Legs swelling for past 4 days.   Daughter Ava would like to have urine check. Pt unable to give urine sample.     Review of Systems Intermittent ataxia intermittent weakness as well as some confusion and some falls    Objective:   Physical Exam Patient complains of some low back pain.  Strength in the legs seem to be good for his age arm strength good for his age patient has difficult time arising out of the chair without some help He also has some ataxia with walking with a cane  Pedal edema Blood pressure on the low end Drop some with standing Patient gets agitated at nighttime hard to give him direction often does not want to settle down    Assessment & Plan:  Physical therapy recommended Walker with wheels and a bench recommended May be some early dementia going on Concerning for that this could be a progressive issue May use Klonopin at nighttime if agitated and difficult time going to sleep otherwise no muscle relaxers no oxycodone Also stop amlodipine. Follow-up in 2 weeks Hold off on any antipsychotics Family will bring back a urine specimen for UA and urine culture

## 2019-11-17 ENCOUNTER — Other Ambulatory Visit: Payer: Self-pay | Admitting: *Deleted

## 2019-11-17 ENCOUNTER — Telehealth: Payer: Self-pay | Admitting: *Deleted

## 2019-11-17 DIAGNOSIS — R300 Dysuria: Secondary | ICD-10-CM | POA: Diagnosis not present

## 2019-11-17 LAB — POCT URINALYSIS DIPSTICK
Blood, UA: POSITIVE
Spec Grav, UA: 1.01 (ref 1.010–1.025)
pH, UA: 5 (ref 5.0–8.0)

## 2019-11-17 NOTE — Telephone Encounter (Signed)
Pt seen this week and could not give urine sample. Daughter ava dropped off urine. Advised dr Nicki Reaper is not here to look at urine. I dipped it and sent it to lab for urine culture. Ava notified I would sent to lab.   Results for orders placed or performed in visit on 11/17/19  POCT urinalysis dipstick  Result Value Ref Range   Color, UA     Clarity, UA     Glucose, UA     Bilirubin, UA     Ketones, UA     Spec Grav, UA 1.010 1.010 - 1.025   Blood, UA positive    pH, UA 5.0 5.0 - 8.0   Protein, UA     Urobilinogen, UA     Nitrite, UA     Leukocytes, UA     Appearance     Odor

## 2019-11-19 LAB — SPECIMEN STATUS REPORT

## 2019-11-19 LAB — URINE CULTURE

## 2019-11-26 ENCOUNTER — Encounter: Payer: Self-pay | Admitting: Emergency Medicine

## 2019-11-26 ENCOUNTER — Ambulatory Visit
Admission: EM | Admit: 2019-11-26 | Discharge: 2019-11-26 | Disposition: A | Discharge status: Home / Self Care | Payer: Medicare HMO | Attending: Emergency Medicine | Admitting: Emergency Medicine

## 2019-11-26 ENCOUNTER — Other Ambulatory Visit: Payer: Self-pay

## 2019-11-26 DIAGNOSIS — M109 Gout, unspecified: Secondary | ICD-10-CM

## 2019-11-26 DIAGNOSIS — R52 Pain, unspecified: Secondary | ICD-10-CM | POA: Diagnosis not present

## 2019-11-26 DIAGNOSIS — R609 Edema, unspecified: Secondary | ICD-10-CM | POA: Diagnosis not present

## 2019-11-26 MED ORDER — SULFAMETHOXAZOLE-TRIMETHOPRIM 800-160 MG PO TABS: 1.0000 | ORAL_TABLET | Freq: Two times a day (BID) | ORAL | 0 refills | Status: DC

## 2019-11-26 MED ORDER — PREDNISONE 10 MG (21) PO TBPK: ORAL_TABLET | ORAL | 0 refills | Status: DC

## 2019-11-26 MED ORDER — ACETAMINOPHEN 325 MG PO TABS
650.0000 mg | ORAL_TABLET | Freq: Once | ORAL | Status: AC
  Administered 2019-11-26: 650 mg via ORAL

## 2019-11-26 MED ORDER — SULFAMETHOXAZOLE-TRIMETHOPRIM 800-160 MG PO TABS: 1.0000 | ORAL_TABLET | Freq: Two times a day (BID) | ORAL | 0 refills | Status: AC

## 2019-11-26 NOTE — ED Provider Notes (Signed)
[place]   [id number] 11/26/19 Arrival Time: 2878   Chief Complaint  Patient presents with   Hand Pain     SUBJECTIVE: History from: patient and family.  [name] is a 86 y.o. male who presented to the urgent care for complaint of right hand pain and swelling for the past 3 to 4 days.  I denies any precipitating event.  Localized pain swelling to the right hand.  He describes the pain as constant and achy.  He has tried OTC medications without relief.  His symptoms are made worse with ROM.  He denies similar symptoms in the past.  Denies chills, fever, nausea, vomiting, diarrhea   ROS: As per HPI.  All other pertinent ROS negative.     Past Medical History:  Diagnosis Date   Asthma    Diverticulosis    GERD (gastroesophageal reflux disease)    Gout    Heart murmur    Evaluation by a cardiologist years ago was reportedly negative   HTN (hypertension)    Osteoarthritis    Reflux    Past Surgical History:  Procedure Laterality Date   APPENDECTOMY  2007   COLONOSCOPY  2003   COLONOSCOPY N/A 12/28/2012   Procedure: COLONOSCOPY;  Surgeon: Rogene Houston, MD;  Location: [facility];  Service: Endoscopy;  Laterality: N/A;  255-rescheduled to 10:30 Ann notified pt   EYE SURGERY Right    cataract removal   INGUINAL HERNIA REPAIR     Right   JOINT REPLACEMENT     right total knee RIGHT total knee arthroplasty   TOTAL KNEE ARTHROPLASTY  09/2009   Right   Allergies  Allergen Reactions   Penicillins Rash   No current facility-administered medications on file prior to encounter.   Current Outpatient Medications on File Prior to Encounter  Medication Sig Dispense Refill   acetaminophen (TYLENOL) 650 MG CR tablet Take 650-1,300 mg by mouth every 8 (eight) hours as needed for pain.     amLODipine (NORVASC) 2.5 MG tablet Take 1 tablet (2.5 mg total) by mouth daily. 30 tablet 5   clonazePAM (KLONOPIN) 0.5 MG tablet 1 qhs prn if  agitated at bedtime 15 tablet 1   cyclobenzaprine (FLEXERIL) 10 MG tablet Take 1 tablet (10 mg total) by mouth 3 (three) times daily as needed for muscle spasms. 20 tablet 0   docusate sodium (COLACE) 100 MG capsule Take 2 capsules (200 mg total) by mouth at bedtime. (Patient not taking: Reported on 11/16/2019)  0   famotidine (PEPCID) 20 MG tablet TAKE (1) TABLET BY MOUTH TWICE DAILY. (Patient taking differently: Take 20 mg by mouth 2 (two) times daily. ) 60 tablet 6   oxyCODONE-acetaminophen (PERCOCET/ROXICET) 5-325 MG tablet Take 1 tablet by mouth every 6 (six) hours as needed. 20 tablet 0   potassium chloride (KLOR-CON) 10 MEQ tablet TAKE (1) TABLET when taking torsemide , take one Mon Wed and Friday (Patient taking differently: Take 10 mEq by mouth every Monday, Wednesday, and Friday. ) 30 tablet 6   rosuvastatin (CRESTOR) 20 MG tablet Take 1 tablet (20 mg total) by mouth daily. 90 tablet 1   torsemide (DEMADEX) 20 MG tablet 1/2 q Mon Wed and Friday only (Patient taking differently: Take 10 mg by mouth every Monday, Wednesday, and Friday. ) 30 tablet 6   VENTOLIN HFA 108 (90 Base) MCG/ACT inhaler INHALE 2 PUFFS EVERY 4 HOURS AS NEEDED. (Patient taking differently: Inhale 2 puffs into the lungs every 4 (four)  hours as needed for wheezing or shortness of breath. ) 18 g 3   Social History   Socioeconomic History   Marital status: Married    Spouse name: Not on file   Number of children: Not on file   Years of education: Not on file   Highest education level: Not on file  Occupational History   Occupation: Work at hospital  Tobacco Use   Smoking status: Former Smoker    Types: Pipe    Start date: 12/04/1939   Smokeless tobacco: Never Used   Tobacco comment: quit smoking cigarettes in 1971  Substance and Sexual Activity   Alcohol use: No   Drug use: No   Sexual activity: Not on file  Other Topics Concern   Not on file  Social History Narrative   Not on file    Social Determinants of Health   Financial Resource Strain:    Difficulty of Paying Living Expenses: Not on file  Food Insecurity:    Worried About Charity fundraiser in the Last Year: Not on file   [place] in the Last Year: Not on file  Transportation Needs:    Lack of Transportation (Medical): Not on file   Lack of Transportation (Non-Medical): Not on file  Physical Activity:    Days of Exercise per Week: Not on file   Minutes of Exercise per Session: Not on file  Stress:    Feeling of Stress : Not on file  Social Connections:    Frequency of Communication with Friends and Family: Not on file   Frequency of Social Gatherings with Friends and Family: Not on file   Attends Religious Services: Not on file   Active Member of Clubs or Organizations: Not on file   Attends Archivist Meetings: Not on file   Marital Status: Not on file  Intimate Partner Violence:    Fear of Current or Ex-Partner: Not on file   Emotionally Abused: Not on file   Physically Abused: Not on file   Sexually Abused: Not on file   Family History  Problem Relation Age of Onset   Hypertension Mother    Hypertension Father    Heart attack Father    Cancer Brother        colon   Arthritis Other    Heart disease Other        Male < 42   Uterine cancer Maternal Aunt     OBJECTIVE:  Vitals:   11/26/19 0841 11/26/19 0842  BP: 133/68   Pulse: 82   Resp: 19   Temp: (!) 100.8 F (38.2 C)   TempSrc: Oral   SpO2: 94%   Weight:  165 lb 5.5 oz (75 kg)  Height:  5\' 10"  (1.778 m)     Physical Exam Vitals and nursing note reviewed.  Constitutional:      General: He is not in acute distress.    Appearance: Normal appearance. He is normal weight. He is not ill-appearing, toxic-appearing or diaphoretic.  Cardiovascular:     Rate and Rhythm: Normal rate and regular rhythm.     Pulses: Normal pulses.     Heart sounds: Normal heart sounds. No murmur heard.  No  friction rub. No gallop.   Pulmonary:     Effort: Pulmonary effort is normal. No respiratory distress.     Breath sounds: Normal breath sounds. No stridor. No wheezing, rhonchi or rales.  Chest:     Chest wall: No tenderness.  Musculoskeletal:        General: Swelling and tenderness present.     Comments: The right hand is with obvious deformity when compared to the left  hand.  Swelling tenderness and warmth present.  Limited range of motion due to swelling and pain.  Neurovascular status intact.  Neurological:     Mental Status: He is alert and oriented to person, place, and time.     LABS:  No results found for this or any previous visit (from the past 24 hour(s)).   ASSESSMENT & PLAN:  1. Acute gout of right hand, unspecified cause     Meds ordered this encounter  Medications   acetaminophen (TYLENOL) tablet 650 mg   predniSONE (STERAPRED UNI-PAK 21 TAB) 10 MG (21) TBPK tablet    Sig: Take 6 tabs by mouth daily  for 1 days, then 5 tabs for 1 days, then 4 tabs for 1 days, then 3 tabs for 1 days, 2 tabs for 1 days, then 1 tab by mouth daily for 1 days    Dispense:  21 tablet    Refill:  0   Symptom is consistent with gout.  Due to patient skin tone, was unable to detect changes in skin color.  Therefore was unable to rule out cellulitis.  We will add Bactrim DS.  Discharge instructions  Prescribed prednisone take as directed and to completion Continue Tylenol as needed for pain Bactrim DS was prescribed for possible cellulitis Primary care provider assistance initiated to establish care Follow up with PCP for further evaluation and management Return or go to the ED if you have any new or worsening symptoms   Reviewed expectations re: course of current medical issues. Questions answered. Outlined signs and symptoms indicating need for more acute intervention. Patient verbalized understanding. After Visit Summary given.     Note: This document was prepared using  Dragon voice recognition software and may include unintentional dictation errors.     Emerson Monte, [place] 11/26/19 [phone]

## 2019-11-26 NOTE — Discharge Instructions (Addendum)
Prescribed prednisone take as directed and to completion Continue Tylenol as needed for pain Bactrim DS was prescribed for possible cellulitis Primary care provider assistance initiated to establish care Follow up with PCP for further evaluation and management Return or go to the ED if you have any new or worsening symptoms

## 2019-11-29 ENCOUNTER — Other Ambulatory Visit: Payer: Self-pay | Admitting: Family Medicine

## 2019-12-06 ENCOUNTER — Ambulatory Visit: Payer: Medicare HMO | Admitting: Family Medicine

## 2019-12-11 DIAGNOSIS — I739 Peripheral vascular disease, unspecified: Secondary | ICD-10-CM | POA: Diagnosis not present

## 2019-12-11 DIAGNOSIS — M205X1 Other deformities of toe(s) (acquired), right foot: Secondary | ICD-10-CM | POA: Diagnosis not present

## 2019-12-11 DIAGNOSIS — M205X2 Other deformities of toe(s) (acquired), left foot: Secondary | ICD-10-CM | POA: Diagnosis not present

## 2019-12-11 DIAGNOSIS — B351 Tinea unguium: Secondary | ICD-10-CM | POA: Diagnosis not present

## 2019-12-11 DIAGNOSIS — I70203 Unspecified atherosclerosis of native arteries of extremities, bilateral legs: Secondary | ICD-10-CM | POA: Diagnosis not present

## 2019-12-20 ENCOUNTER — Ambulatory Visit: Payer: Medicare HMO | Admitting: Family Medicine

## 2019-12-27 ENCOUNTER — Other Ambulatory Visit: Payer: Self-pay | Admitting: Family Medicine

## 2020-01-10 ENCOUNTER — Other Ambulatory Visit: Payer: Self-pay

## 2020-01-10 ENCOUNTER — Other Ambulatory Visit (INDEPENDENT_AMBULATORY_CARE_PROVIDER_SITE_OTHER): Payer: Medicare HMO | Admitting: *Deleted

## 2020-01-10 DIAGNOSIS — Z23 Encounter for immunization: Secondary | ICD-10-CM | POA: Diagnosis not present

## 2020-01-29 ENCOUNTER — Encounter: Payer: Self-pay | Admitting: Family Medicine

## 2020-01-29 ENCOUNTER — Other Ambulatory Visit: Payer: Self-pay | Admitting: Family Medicine

## 2020-01-29 ENCOUNTER — Other Ambulatory Visit: Payer: Self-pay

## 2020-01-29 ENCOUNTER — Telehealth: Payer: Self-pay

## 2020-01-29 ENCOUNTER — Ambulatory Visit (INDEPENDENT_AMBULATORY_CARE_PROVIDER_SITE_OTHER): Payer: Medicare HMO | Admitting: Family Medicine

## 2020-01-29 VITALS — BP 128/86 | HR 78 | Temp 97.9°F | Ht 70.0 in | Wt 172.3 lb

## 2020-01-29 DIAGNOSIS — R609 Edema, unspecified: Secondary | ICD-10-CM

## 2020-01-29 DIAGNOSIS — R600 Localized edema: Secondary | ICD-10-CM

## 2020-01-29 NOTE — Telephone Encounter (Signed)
Please schedule for office visit this week with myself or Santiago Glad

## 2020-01-29 NOTE — Progress Notes (Signed)
Patient ID: [name], male    DOB: [birth year], 86 y.o.   MRN: [id number]   Chief Complaint  Patient presents with  . pedal edema    continues- bilateral swelling in feet- still taking Norvasc per patient    Subjective:  CC: feet swelling  Presents today with a complaint of bilateral feet swelling.  This is not a new problem, he was seen at this office on September 2 with the same complaint.  Dr. Sallee Lange instructed him to stop his amlodipine at that time.  He returns today with his wife concerned with his feet swelling.  He denies shortness of breath, chest pain, no fever no chills.    Medical History [facility] has a past medical history of Asthma, Diverticulosis, GERD (gastroesophageal reflux disease), Gout, Heart murmur, HTN (hypertension), Osteoarthritis, and Reflux.   Outpatient Encounter Medications as of 01/29/2020  Medication Sig  . acetaminophen (TYLENOL) 650 MG CR tablet Take 650-1,300 mg by mouth every 8 (eight) hours as needed for pain.  Marland Kitchen amLODipine (NORVASC) 2.5 MG tablet Take 1 tablet (2.5 mg total) by mouth daily.  . clonazePAM (KLONOPIN) 0.5 MG tablet 1 qhs prn if agitated at bedtime  . famotidine (PEPCID) 20 MG tablet TAKE (1) TABLET BY MOUTH TWICE DAILY. (Patient taking differently: Take 20 mg by mouth 2 (two) times daily. )  . [DISCONTINUED] torsemide (DEMADEX) 20 MG tablet TAKE 1/2 TABLET ON MONDAY, WEDNESDAY AND FRIDAY ONLY.  . [DISCONTINUED] VENTOLIN HFA 108 (90 Base) MCG/ACT inhaler INHALE 2 PUFFS EVERY 4 HOURS AS NEEDED. (Patient taking differently: Inhale 2 puffs into the lungs every 4 (four) hours as needed for wheezing or shortness of breath. )  . cyclobenzaprine (FLEXERIL) 10 MG tablet Take 1 tablet (10 mg total) by mouth 3 (three) times daily as needed for muscle spasms. (Patient not taking: Reported on 01/29/2020)  . docusate sodium (COLACE) 100 MG capsule Take 2 capsules (200 mg total) by mouth at bedtime. (Patient not taking:  Reported on 11/16/2019)  . oxyCODONE-acetaminophen (PERCOCET/ROXICET) 5-325 MG tablet Take 1 tablet by mouth every 6 (six) hours as needed. (Patient not taking: Reported on 01/29/2020)  . potassium chloride (KLOR-CON) 10 MEQ tablet TAKE (1) TABLET when taking torsemide , take one Mon Wed and Friday (Patient taking differently: Take 10 mEq by mouth every Monday, Wednesday, and Friday. )  . predniSONE (STERAPRED UNI-PAK 21 TAB) 10 MG (21) TBPK tablet Take 6 tabs by mouth daily  for 1 days, then 5 tabs for 1 days, then 4 tabs for 1 days, then 3 tabs for 1 days, 2 tabs for 1 days, then 1 tab by mouth daily for 1 days (Patient not taking: Reported on 01/29/2020)  . rosuvastatin (CRESTOR) 20 MG tablet Take 1 tablet (20 mg total) by mouth daily.   No facility-administered encounter medications on file as of 01/29/2020.     Review of Systems  Constitutional: Negative for chills and fever.  Respiratory: Negative for shortness of breath.   Cardiovascular: Positive for leg swelling. Negative for chest pain.  Gastrointestinal: Negative for abdominal pain.  Musculoskeletal: Negative for joint swelling.     Vitals BP 128/86   Pulse 78   Temp 97.9 F (36.6 C) (Oral)   Ht 5\' 10"  (1.778 m)   Wt 172 lb 4.8 oz (78.2 kg)   SpO2 98%   BMI 24.72 kg/m   Objective:   Physical Exam Vitals and nursing note reviewed.  Constitutional:  General: He is not in acute distress.    Appearance: Normal appearance. He is not ill-appearing.  Cardiovascular:     Rate and Rhythm: Normal rate and regular rhythm.     Heart sounds: Normal heart sounds.  Pulmonary:     Effort: Pulmonary effort is normal.     Breath sounds: Normal breath sounds.  Musculoskeletal:     Right lower leg: Edema present.     Left lower leg: Edema present.     Comments: 1+ pitting edema noted bilaterally up to approx mid-shin.    Skin:    General: Skin is warm and dry.  Neurological:     Mental Status: He is alert. Mental status is  at baseline.  Psychiatric:        Behavior: Behavior normal.     Comments: Difficult to assess understanding concerning instructions to stop taking amlodipine.  Wife present today.      Assessment and Plan   1. Peripheral edema   Bilateral feet, ankle and pitting edema up to approximately mid shin noted today.  Continue to take his amlodipine, which he was instructed to stop on September 2 by Dr. Sallee Lange.  Differential diagnosis for acute edema include medications, heart failure, nephrotic syndrome.  He is not short of breath, denies chest pain, and lungs are clear.    On September 1, his GFR was 52.  It is prudent to eliminate the calcium channel blocker as a cause of the peripheral edema, he is encouraged and instructed to stop taking amlodipine immediately.  Agrees with plan of care discussed today. Understands warning signs to seek further care: Chest pain, shortness of breath, any significant changes in health status. Understands to follow-up in 1 week, sooner if anything changes.  Will assess blood pressure control and edema at that time.  Pecolia Ades, FNP-C

## 2020-01-29 NOTE — Patient Instructions (Addendum)
STOP taking AMLODIPINE NOW. Follow-up in one week to see how your blood pressure is doing and the ankle swelling.      Edema  Edema is when you have too much fluid in your body or under your skin. Edema may make your legs, feet, and ankles swell up. Swelling is also common in looser tissues, like around your eyes. This is a common condition. It gets more common as you get older. There are many possible causes of edema. Eating too much salt (sodium) and being on your feet or sitting for a long time can cause edema in your legs, feet, and ankles. Hot weather may make edema worse. Edema is usually painless. Your skin may look swollen or shiny. Follow these instructions at home:  Keep the swollen body part raised (elevated) above the level of your heart when you are sitting or lying down.  Do not sit still or stand for a long time.  Do not wear tight clothes. Do not wear garters on your upper legs.  Exercise your legs. This can help the swelling go down.  Wear elastic bandages or support stockings as told by your doctor.  Eat a low-salt (low-sodium) diet to reduce fluid as told by your doctor.  Depending on the cause of your swelling, you may need to limit how much fluid you drink (fluid restriction).  Take over-the-counter and prescription medicines only as told by your doctor. Contact a doctor if:  Treatment is not working.  You have heart, liver, or kidney disease and have symptoms of edema.  You have sudden and unexplained weight gain. Get help right away if:  You have shortness of breath or chest pain.  You cannot breathe when you lie down.  You have pain, redness, or warmth in the swollen areas.  You have heart, liver, or kidney disease and get edema all of a sudden.  You have a fever and your symptoms get worse all of a sudden. Summary  Edema is when you have too much fluid in your body or under your skin.  Edema may make your legs, feet, and ankles swell up.  Swelling is also common in looser tissues, like around your eyes.  Raise (elevate) the swollen body part above the level of your heart when you are sitting or lying down.  Follow your doctor's instructions about diet and how much fluid you can drink (fluid restriction). This information is not intended to replace advice given to you by your health care provider. Make sure you discuss any questions you have with your health care provider. Document Revised: 03/05/2017 Document Reviewed: 03/20/2016 Elsevier Patient Education  2020 [employer].

## 2020-01-29 NOTE — Telephone Encounter (Signed)
[name] call in about her daddy [name] to let Dr Nicki Reaper know that his feet is swelling.   [name] call back [phone]

## 2020-02-05 ENCOUNTER — Ambulatory Visit: Payer: Medicare HMO | Admitting: Family Medicine

## 2020-02-12 ENCOUNTER — Ambulatory Visit (INDEPENDENT_AMBULATORY_CARE_PROVIDER_SITE_OTHER): Payer: Medicare HMO | Admitting: Family Medicine

## 2020-02-12 ENCOUNTER — Encounter: Payer: Self-pay | Admitting: Family Medicine

## 2020-02-12 ENCOUNTER — Other Ambulatory Visit: Payer: Self-pay

## 2020-02-12 ENCOUNTER — Telehealth: Payer: Self-pay

## 2020-02-12 VITALS — BP 146/92 | HR 72 | Temp 98.2°F | Ht 70.0 in | Wt 169.0 lb

## 2020-02-12 DIAGNOSIS — R600 Localized edema: Secondary | ICD-10-CM | POA: Diagnosis not present

## 2020-02-12 DIAGNOSIS — R0600 Dyspnea, unspecified: Secondary | ICD-10-CM

## 2020-02-12 DIAGNOSIS — R0609 Other forms of dyspnea: Secondary | ICD-10-CM | POA: Insufficient documentation

## 2020-02-12 MED ORDER — POTASSIUM CHLORIDE ER 10 MEQ PO TBCR: 10.0000 meq | EXTENDED_RELEASE_TABLET | Freq: Two times a day (BID) | ORAL | 3 refills | Status: DC

## 2020-02-12 MED ORDER — TORSEMIDE 20 MG PO TABS: 20.0000 mg | ORAL_TABLET | Freq: Every day | ORAL | 3 refills | Status: DC

## 2020-02-12 NOTE — Telephone Encounter (Signed)
Message for [name]- patient was told to stop taking blood pressure medication for a week . He wants to know if he can return taking his medication now. He was seen today in office.

## 2020-02-12 NOTE — Telephone Encounter (Signed)
Dr. Nicki Reaper,  Did you address [name] blood pressure medicine today at the visit? Thanks, [name]

## 2020-02-12 NOTE — Progress Notes (Addendum)
Patient ID: [name], male    DOB: September 11, 1925, 86 y.o.   MRN: [id number]   Chief Complaint  Patient presents with   Edema   Subjective:  CC: follow up on edema  Presents today for follow-up on his bilateral leg edema.  He reports that he did stop taking his amlodipine, and that his leg edema is worse.  It is now edematous up to his knees.  Reports shortness of breath, this is not new.  No other complaints, he says that he is busy walking around and doing things and when he sitting his legs are dangling he does not elevate them.  He denies fever chills chest pain.   follow up on bilateral leg swelling.      Medical History [name] has a past medical history of Asthma, Diverticulosis, GERD (gastroesophageal reflux disease), Gout, Heart murmur, HTN (hypertension), Osteoarthritis, and Reflux.   Outpatient Encounter Medications as of 02/12/2020  Medication Sig   acetaminophen (TYLENOL) 650 MG CR tablet Take 650-1,300 mg by mouth every 8 (eight) hours as needed for pain.   clonazePAM (KLONOPIN) 0.5 MG tablet 1 qhs prn if agitated at bedtime   cyclobenzaprine (FLEXERIL) 10 MG tablet Take 1 tablet (10 mg total) by mouth 3 (three) times daily as needed for muscle spasms.   docusate sodium (COLACE) 100 MG capsule Take 2 capsules (200 mg total) by mouth at bedtime.   famotidine (PEPCID) 20 MG tablet TAKE (1) TABLET BY MOUTH TWICE DAILY. (Patient taking differently: Take 20 mg by mouth 2 (two) times daily. )   oxyCODONE-acetaminophen (PERCOCET/ROXICET) 5-325 MG tablet Take 1 tablet by mouth every 6 (six) hours as needed.   VENTOLIN HFA 108 (90 Base) MCG/ACT inhaler INHALE 2 PUFFS EVERY 4 HOURS AS NEEDED.   [DISCONTINUED] potassium chloride (KLOR-CON) 10 MEQ tablet TAKE (1) TABLET when taking torsemide , take one Mon Wed and Friday (Patient taking differently: Take 10 mEq by mouth every Monday, Wednesday, and Friday. )   [DISCONTINUED] predniSONE (STERAPRED UNI-PAK 21 TAB) 10  MG (21) TBPK tablet Take 6 tabs by mouth daily  for 1 days, then 5 tabs for 1 days, then 4 tabs for 1 days, then 3 tabs for 1 days, 2 tabs for 1 days, then 1 tab by mouth daily for 1 days   [DISCONTINUED] torsemide (DEMADEX) 20 MG tablet TAKE 1/2 TABLET ON MONDAY, WEDNESDAY AND FRIDAY ONLY.   potassium chloride (KLOR-CON) 10 MEQ tablet Take 1 tablet (10 mEq total) by mouth 2 (two) times daily.   rosuvastatin (CRESTOR) 20 MG tablet Take 1 tablet (20 mg total) by mouth daily.   torsemide (DEMADEX) 20 MG tablet Take 1 tablet (20 mg total) by mouth daily.   No facility-administered encounter medications on file as of 02/12/2020.     Review of Systems  Constitutional: Negative for chills and fever.  Respiratory: Positive for shortness of breath.   Cardiovascular: Positive for leg swelling. Negative for chest pain.     Vitals BP (!) 160/98    Pulse 72    Temp 98.2 F (36.8 C)    Ht 5\' 10"  (1.778 m)    Wt 169 lb (76.7 kg)    SpO2 98%    BMI 24.25 kg/m   Objective:   Physical Exam Vitals and nursing note reviewed.  Constitutional:      General: He is not in acute distress.    Appearance: Normal appearance.  Cardiovascular:     Rate and Rhythm: Normal rate and  regular rhythm.     Pulses: Normal pulses.     Heart sounds: Normal heart sounds.  Pulmonary:     Effort: Pulmonary effort is normal.     Breath sounds: Normal breath sounds.  Musculoskeletal:     Right lower leg: Edema present.     Left lower leg: Edema present.  Skin:    General: Skin is warm and dry.  Neurological:     Mental Status: He is alert and oriented to person, place, and time. Mental status is at baseline.  Psychiatric:        Mood and Affect: Mood normal.        Behavior: Behavior normal.        Thought Content: Thought content normal.        Judgment: Judgment normal.      Assessment and Plan   1. Pedal edema - B Nat Peptide - Basic metabolic panel - potassium chloride (KLOR-CON) 10 MEQ tablet;  Take 1 tablet (10 mEq total) by mouth 2 (two) times daily.  Dispense: 60 tablet; Refill: 3 - torsemide (DEMADEX) 20 MG tablet; Take 1 tablet (20 mg total) by mouth daily.  Dispense: 30 tablet; Refill: 3  2. DOE (dyspnea on exertion) - B Nat Peptide - Basic metabolic panel   Edema worse, blood pressure elevated due to stopping amlodipine.  Continues to take Demadex 20 mg a half a tablet on Monday Wednesday and Friday edema not responding.  Will consult with Dr. Sallee Lange for plan of care.  Dr. Wolfgang Phoenix assessed [name] in my absence and ordered lab work, potassium supplements, and Demadex 20 mg every morning.  Agrees with plan of care discussed today. Understands warning signs to seek further care: Chest pain, worsening shortness of breath, worsening edema, any significant change in health status. Understands to follow-up in 2 weeks with Dr. Sallee Lange.  Lab results will go to Dr. Lance Sell inbox for further evaluation.  I did recheck the patient's blood pressure it was not as high.  146/92.  I elected to start Demadex and bring the patient back in 2 weeks to recheck his edema as well as blood pressure at that time-Dr. Reita Chard, FNP-C 02/12/2020

## 2020-02-13 LAB — BASIC METABOLIC PANEL
BUN/Creatinine Ratio: 10 (ref 10–24)
BUN: 11 mg/dL (ref 10–36)
CO2: 28 mmol/L (ref 20–29)
Calcium: 9.8 mg/dL (ref 8.6–10.2)
Chloride: 103 mmol/L (ref 96–106)
Creatinine, Ser: 1.11 mg/dL (ref 0.76–1.27)
GFR calc Af Amer: 66 mL/min/{1.73_m2} (ref >59)
GFR calc non Af Amer: 57 mL/min/{1.73_m2} — ABNORMAL LOW (ref >59)
Glucose: 90 mg/dL (ref 65–99)
Potassium: 3.8 mmol/L (ref 3.5–5.2)
Sodium: 144 mmol/L (ref 134–144)

## 2020-02-13 LAB — BRAIN NATRIURETIC PEPTIDE: BNP: 58.4 pg/mL (ref 0.0–100.0)

## 2020-02-13 NOTE — Telephone Encounter (Signed)
Attempted to contact patient. Voicemail full; unable to leave message

## 2020-02-13 NOTE — Telephone Encounter (Signed)
Blood pressure was rechecked while he was here it was not as high-I did do an addendum to his note from yesterday I recommend the Demadex 1 daily His kidney functions look good BNP is pending I would like for him to do a nurse visit next week to check blood pressure Patient to keep follow-up office visit with me in mid December

## 2020-02-14 NOTE — Telephone Encounter (Signed)
Pt wife contacted and verbalized understanding.  

## 2020-02-20 ENCOUNTER — Other Ambulatory Visit: Payer: Self-pay

## 2020-02-20 ENCOUNTER — Other Ambulatory Visit (INDEPENDENT_AMBULATORY_CARE_PROVIDER_SITE_OTHER): Payer: Medicare HMO | Admitting: Family Medicine

## 2020-02-20 VITALS — BP 126/86

## 2020-02-20 DIAGNOSIS — R600 Localized edema: Secondary | ICD-10-CM | POA: Diagnosis not present

## 2020-02-20 DIAGNOSIS — I10 Essential (primary) hypertension: Secondary | ICD-10-CM

## 2020-02-20 NOTE — Patient Instructions (Signed)
P is good Stay on torsemide 20 mg one daily Take potassium one 2 times a day  Recheck here in 4 weeks

## 2020-02-20 NOTE — Progress Notes (Signed)
   Subjective:    Patient ID: [name], male    DOB: Nov 30, 1925, 86 y.o.   MRN: [id number]  HPIfollow up on htn and edema.  Patient with a history of hypertension Previous medication not under good control Now on diuretic Blood pressure looks good today Watching his diet relatively well Less edema in his legs.  Denies shortness of breath or chest pain   Review of Systems See above    Objective:   Physical Exam Lungs clear heart regular pulse normal extremities minimal edema blood pressure was checked twice both times look good       Assessment & Plan:  Edema is under good control Blood pressure under good control Patient will continue his diuretic along with his potassium He will recheck again within a month's time He will follow-up sooner if any problems No sign of failure going on

## 2020-02-26 ENCOUNTER — Ambulatory Visit: Payer: Medicare HMO | Admitting: Family Medicine

## 2020-03-01 ENCOUNTER — Ambulatory Visit: Payer: Medicare HMO | Admitting: Family Medicine

## 2020-03-11 ENCOUNTER — Other Ambulatory Visit: Payer: Self-pay | Admitting: *Deleted

## 2020-03-11 DIAGNOSIS — I77819 Aortic ectasia, unspecified site: Secondary | ICD-10-CM

## 2020-03-13 ENCOUNTER — Telehealth: Payer: Self-pay | Admitting: Family Medicine

## 2020-03-15 ENCOUNTER — Other Ambulatory Visit: Payer: Self-pay | Admitting: Family Medicine

## 2020-03-20 ENCOUNTER — Ambulatory Visit (HOSPITAL_COMMUNITY)
Admission: RE | Admit: 2020-03-20 | Discharge: 2020-03-20 | Disposition: A | Discharge status: Home / Self Care | Payer: Medicare HMO | Source: Ambulatory Visit | Attending: Family Medicine | Admitting: Family Medicine

## 2020-03-20 ENCOUNTER — Other Ambulatory Visit: Payer: Self-pay

## 2020-03-20 DIAGNOSIS — Q8909 Congenital malformations of spleen: Secondary | ICD-10-CM | POA: Diagnosis not present

## 2020-03-20 DIAGNOSIS — I714 Abdominal aortic aneurysm, without rupture: Secondary | ICD-10-CM | POA: Diagnosis not present

## 2020-03-20 DIAGNOSIS — K760 Fatty (change of) liver, not elsewhere classified: Secondary | ICD-10-CM | POA: Diagnosis not present

## 2020-03-20 DIAGNOSIS — K7689 Other specified diseases of liver: Secondary | ICD-10-CM | POA: Diagnosis not present

## 2020-03-20 DIAGNOSIS — I77819 Aortic ectasia, unspecified site: Secondary | ICD-10-CM | POA: Diagnosis not present

## 2020-03-21 ENCOUNTER — Ambulatory Visit (INDEPENDENT_AMBULATORY_CARE_PROVIDER_SITE_OTHER): Payer: Medicare HMO | Admitting: Family Medicine

## 2020-03-21 ENCOUNTER — Encounter: Payer: Self-pay | Admitting: Family Medicine

## 2020-03-21 VITALS — BP 134/76 | HR 76 | Temp 97.9°F | Ht 70.0 in | Wt 175.0 lb

## 2020-03-21 DIAGNOSIS — D509 Iron deficiency anemia, unspecified: Secondary | ICD-10-CM | POA: Diagnosis not present

## 2020-03-21 DIAGNOSIS — L89321 Pressure ulcer of left buttock, stage 1: Secondary | ICD-10-CM

## 2020-03-21 DIAGNOSIS — I10 Essential (primary) hypertension: Secondary | ICD-10-CM

## 2020-03-21 DIAGNOSIS — R600 Localized edema: Secondary | ICD-10-CM | POA: Diagnosis not present

## 2020-03-21 DIAGNOSIS — I77819 Aortic ectasia, unspecified site: Secondary | ICD-10-CM

## 2020-03-21 DIAGNOSIS — E7849 Other hyperlipidemia: Secondary | ICD-10-CM

## 2020-03-21 MED ORDER — TORSEMIDE 20 MG PO TABS: 20.0000 mg | ORAL_TABLET | Freq: Every day | ORAL | 5 refills | Status: DC

## 2020-03-21 MED ORDER — MUPIROCIN 2 % EX OINT: 1.0000 "application " | TOPICAL_OINTMENT | Freq: Two times a day (BID) | CUTANEOUS | 0 refills | Status: DC

## 2020-03-21 MED ORDER — POTASSIUM CHLORIDE ER 10 MEQ PO TBCR: 10.0000 meq | EXTENDED_RELEASE_TABLET | Freq: Two times a day (BID) | ORAL | 5 refills | Status: DC

## 2020-03-21 MED ORDER — ROSUVASTATIN CALCIUM 20 MG PO TABS: 20.0000 mg | ORAL_TABLET | Freq: Every day | ORAL | 1 refills | Status: DC

## 2020-03-21 MED ORDER — CLONAZEPAM 0.5 MG PO TABS
ORAL_TABLET | ORAL | 2 refills | Status: DC
Start: 2020-03-21 — End: 2021-07-22

## 2020-03-21 MED ORDER — FAMOTIDINE 20 MG PO TABS: ORAL_TABLET | ORAL | 5 refills | Status: DC

## 2020-03-21 NOTE — Progress Notes (Signed)
   Subjective:    Patient ID: [name], male    DOB: 1925-11-29, 87 y.o.   MRN: [id number]  HPIHtn check up. Pedal edema - Plan: Comprehensive metabolic panel, torsemide (DEMADEX) 20 MG tablet, potassium chloride (KLOR-CON) 10 MEQ tablet  Dilation of aorta (HCC) - Plan: Comprehensive metabolic panel  Essential hypertension - Plan: Comprehensive metabolic panel  Other hyperlipidemia - Plan: Comprehensive metabolic panel, Lipid panel  Iron deficiency anemia, unspecified iron deficiency anemia type - Plan: Ferritin, CBC with Differential/Platelet, Comprehensive metabolic panel  Chronic pedal edema in both legs sometimes worse in the right leg sits in the chair a lot.  Also sleeps in recliner at times.  Sometimes sleeps in the bed Has history of elevated blood pressure takes his medication on a regular basis.  Blood pressure today looks good History hyperlipidemia takes his medication tolerates it well Has moderate insomnia issues takes medication occasionally caution drowsiness Discuss abdominal ultrasound.   Concerns about a bump on bottom that is spreading and making it hard to sit down. Has been there for about 5 months.  This area has been going on for about 5 months    Review of Systems    See above denies abdominal pain denies rectal bleeding Objective:   Physical Exam Lungs clear respiratory rate normal heart regular extremities has pedal edema but not severe mainly in the lower legs blood pressure good Buttock has a excoriated area of thickened skin consistent with a early pressure sore.  Important for the patient to use extra padding in his chair and to sleep in the bed at times       Assessment & Plan:  1. Pedal edema Knee-high surgical support hose - Comprehensive metabolic panel - torsemide (DEMADEX) 20 MG tablet; Take 1 tablet (20 mg total) by mouth daily.  Dispense: 30 tablet; Refill: 5 - potassium chloride (KLOR-CON) 10 MEQ tablet; Take 1 tablet (10 mEq  total) by mouth 2 (two) times daily.  Dispense: 60 tablet; Refill: 5  2. Dilation of aorta (HCC) We will look at the scan in 3 years time as recommended - Comprehensive metabolic panel  3. Essential hypertension Blood pressure good control continue current measures - Comprehensive metabolic panel  4. Other hyperlipidemia Check lab work continue medication watch diet - Comprehensive metabolic panel - Lipid panel  5. Iron deficiency anemia, unspecified iron deficiency anemia type Check lab work no sign of rectal bleeding - Ferritin - CBC with Differential/Platelet - Comprehensive metabolic panel  6. Pressure injury of left buttock, stage 1 Bactroban ointment use extra padding

## 2020-04-15 ENCOUNTER — Ambulatory Visit: Payer: Medicare HMO | Admitting: Orthopedic Surgery

## 2020-04-16 ENCOUNTER — Other Ambulatory Visit: Payer: Self-pay

## 2020-04-16 ENCOUNTER — Ambulatory Visit (INDEPENDENT_AMBULATORY_CARE_PROVIDER_SITE_OTHER): Payer: Medicare HMO | Admitting: Family Medicine

## 2020-04-16 ENCOUNTER — Encounter: Payer: Self-pay | Admitting: Family Medicine

## 2020-04-16 VITALS — BP 142/82 | HR 85 | Temp 95.6°F | Wt 178.0 lb

## 2020-04-16 DIAGNOSIS — I10 Essential (primary) hypertension: Secondary | ICD-10-CM

## 2020-04-16 DIAGNOSIS — D509 Iron deficiency anemia, unspecified: Secondary | ICD-10-CM | POA: Diagnosis not present

## 2020-04-16 DIAGNOSIS — R0609 Other forms of dyspnea: Secondary | ICD-10-CM

## 2020-04-16 DIAGNOSIS — L89321 Pressure ulcer of left buttock, stage 1: Secondary | ICD-10-CM | POA: Diagnosis not present

## 2020-04-16 DIAGNOSIS — R0600 Dyspnea, unspecified: Secondary | ICD-10-CM

## 2020-04-16 DIAGNOSIS — E7849 Other hyperlipidemia: Secondary | ICD-10-CM

## 2020-04-16 DIAGNOSIS — R600 Localized edema: Secondary | ICD-10-CM | POA: Diagnosis not present

## 2020-04-16 MED ORDER — MUPIROCIN 2 % EX OINT
1.0000 | TOPICAL_OINTMENT | Freq: Two times a day (BID) | CUTANEOUS | 3 refills | Status: DC
Start: 2020-04-16 — End: 2020-09-13

## 2020-04-16 MED ORDER — TORSEMIDE 20 MG PO TABS: ORAL_TABLET | ORAL | 5 refills | Status: DC

## 2020-04-16 NOTE — Progress Notes (Signed)
   Subjective:    Patient ID: [name], male    DOB: 1925-10-18, 87 y.o.   MRN: [id number]  HPI Pt has spot on bottom area that has becoming bigger and painful when sitting. Pt also have leg swelling. Pt was seen about 2 weeks ago for edema and pressure injury to bottom.   Patient has noticed increased swelling in his legs.  He denies chest tightness pressure pain or shortness of breath denies nausea vomiting diarrhea.  Patient denies PND orthopnea.  He does relate an area on his bottom that tends to be a little sore at times his wife states he sits in hard chairs intermittently Review of Systems    Please see above Objective:   Physical Exam Lungs are clear no crackles respiratory rate normal heart regular no murmurs extremities significant edema in the lower legs  Patient has a ulcerative area that is broken through the top layer of the skin approximately 2 mm x 3 mm on his left buttock     Assessment & Plan:  Pedal edema increase fluid pills 2 weeks morning Lab work ordered patient needs to get this done this week Possibility of underlying heart failure but I think that is less likely I think it is more likely related to his kidneys The area on the bottom on to be treated with DuoDERM patch and recheck in 4 weeks Patient may need referral to kidney doctor depending on results of tests Patient needs to start utilizing a cushion on a regular basis

## 2020-04-17 DIAGNOSIS — I10 Essential (primary) hypertension: Secondary | ICD-10-CM | POA: Diagnosis not present

## 2020-04-17 DIAGNOSIS — R0600 Dyspnea, unspecified: Secondary | ICD-10-CM | POA: Diagnosis not present

## 2020-04-17 DIAGNOSIS — D509 Iron deficiency anemia, unspecified: Secondary | ICD-10-CM | POA: Diagnosis not present

## 2020-04-17 DIAGNOSIS — E7849 Other hyperlipidemia: Secondary | ICD-10-CM | POA: Diagnosis not present

## 2020-04-18 LAB — COMPREHENSIVE METABOLIC PANEL
ALT: 16 IU/L (ref 0–44)
AST: 22 IU/L (ref 0–40)
Albumin/Globulin Ratio: 1.6 (ref 1.2–2.2)
Albumin: 4.2 g/dL (ref 3.5–4.6)
Alkaline Phosphatase: 72 IU/L (ref 44–121)
BUN/Creatinine Ratio: 8 — ABNORMAL LOW (ref 10–24)
BUN: 12 mg/dL (ref 10–36)
Bilirubin Total: 1.4 mg/dL — ABNORMAL HIGH (ref 0.0–1.2)
CO2: 28 mmol/L (ref 20–29)
Calcium: 9.5 mg/dL (ref 8.6–10.2)
Chloride: 103 mmol/L (ref 96–106)
Creatinine, Ser: 1.48 mg/dL — ABNORMAL HIGH (ref 0.76–1.27)
GFR calc Af Amer: 47 mL/min/{1.73_m2} — ABNORMAL LOW (ref >59)
GFR calc non Af Amer: 40 mL/min/{1.73_m2} — ABNORMAL LOW (ref >59)
Globulin, Total: 2.6 g/dL (ref 1.5–4.5)
Glucose: 92 mg/dL (ref 65–99)
Potassium: 4.1 mmol/L (ref 3.5–5.2)
Sodium: 145 mmol/L — ABNORMAL HIGH (ref 134–144)
Total Protein: 6.8 g/dL (ref 6.0–8.5)

## 2020-04-18 LAB — CBC
Hematocrit: 36.3 % — ABNORMAL LOW (ref 37.5–51.0)
Hemoglobin: 11.8 g/dL — ABNORMAL LOW (ref 13.0–17.7)
MCH: 31.9 pg (ref 26.6–33.0)
MCHC: 32.5 g/dL (ref 31.5–35.7)
MCV: 98 fL — ABNORMAL HIGH (ref 79–97)
Platelets: 134 10*3/uL — ABNORMAL LOW (ref 150–450)
RBC: 3.7 x10E6/uL — ABNORMAL LOW (ref 4.14–5.80)
RDW: 10.9 % — ABNORMAL LOW (ref 11.6–15.4)
WBC: 4.8 10*3/uL (ref 3.4–10.8)

## 2020-04-18 LAB — LIPID PANEL
Chol/HDL Ratio: 1.6 ratio (ref 0.0–5.0)
Cholesterol, Total: 120 mg/dL (ref 100–199)
HDL: 76 mg/dL (ref >39)
LDL Chol Calc (NIH): 29 mg/dL (ref 0–99)
Triglycerides: 73 mg/dL (ref 0–149)
VLDL Cholesterol Cal: 15 mg/dL (ref 5–40)

## 2020-04-18 LAB — FERRITIN: Ferritin: 226 ng/mL (ref 30–400)

## 2020-04-18 LAB — BRAIN NATRIURETIC PEPTIDE: BNP: 29 pg/mL (ref 0.0–100.0)

## 2020-04-19 ENCOUNTER — Other Ambulatory Visit: Payer: Self-pay | Admitting: *Deleted

## 2020-04-19 DIAGNOSIS — Z79899 Other long term (current) drug therapy: Secondary | ICD-10-CM

## 2020-05-14 ENCOUNTER — Ambulatory Visit: Payer: Medicare HMO | Admitting: Family Medicine

## 2020-05-20 ENCOUNTER — Ambulatory Visit: Payer: Medicare HMO | Admitting: Family Medicine

## 2020-06-03 ENCOUNTER — Encounter: Payer: Self-pay | Admitting: Family Medicine

## 2020-06-03 ENCOUNTER — Ambulatory Visit (INDEPENDENT_AMBULATORY_CARE_PROVIDER_SITE_OTHER): Payer: Medicare HMO | Admitting: Family Medicine

## 2020-06-03 ENCOUNTER — Other Ambulatory Visit: Payer: Self-pay

## 2020-06-03 VITALS — BP 128/70 | HR 70 | Temp 97.2°F | Ht 70.0 in | Wt 177.0 lb

## 2020-06-03 DIAGNOSIS — M7918 Myalgia, other site: Secondary | ICD-10-CM

## 2020-06-03 DIAGNOSIS — H6121 Impacted cerumen, right ear: Secondary | ICD-10-CM | POA: Diagnosis not present

## 2020-06-03 DIAGNOSIS — N289 Disorder of kidney and ureter, unspecified: Secondary | ICD-10-CM

## 2020-06-03 NOTE — Progress Notes (Signed)
   Subjective:    Patient ID: [name], male    DOB: 1925-10-24, 87 y.o.   MRN: [id number]  HPIfollow up on pressure injury of left buttock.   Right ear pain. Started 2 weeks ago.  Relates fullness in the ear difficulty hearing States energy level doing okay Denies high fever chills sweats    Review of Systems  Constitutional: Negative for activity change, fatigue and fever.  HENT: Negative for congestion and rhinorrhea.   Respiratory: Negative for cough and shortness of breath.   Cardiovascular: Negative for chest pain and leg swelling.  Gastrointestinal: Negative for abdominal pain, diarrhea and nausea.  Genitourinary: Negative for dysuria and hematuria.  Neurological: Negative for weakness and headaches.  Psychiatric/Behavioral: Negative for agitation and behavioral problems.       Objective:   Physical Exam Vitals reviewed.  Cardiovascular:     Rate and Rhythm: Normal rate and regular rhythm.     Heart sounds: Normal heart sounds. No murmur heard.   Pulmonary:     Effort: Pulmonary effort is normal.     Breath sounds: Normal breath sounds.  Lymphadenopathy:     Cervical: No cervical adenopathy.  Neurological:     Mental Status: He is alert.  Psychiatric:        Behavior: Behavior normal.           Assessment & Plan:  1. Impacted cerumen of right ear Referral to ENT feels a lot of pressure in the ear difficulty hearing  - Ambulatory referral to ENT  2. Buttock pain Area where there was a sore is now healing there is scar tissue that I recommend using a cushion every time he sits  3. Renal insufficiency Recent lab work showed renal insufficiency recheck this again to see if it is a true trend - Basic metabolic panel

## 2020-06-03 NOTE — Patient Instructions (Addendum)
We will let you know what your lab work shows  We will see you back in 3 months  Please use a extra cushion when you sit  We will also set you up with your doctor to have the earwax removed

## 2020-06-04 LAB — BASIC METABOLIC PANEL
BUN/Creatinine Ratio: 12 (ref 10–24)
BUN: 17 mg/dL (ref 10–36)
CO2: 24 mmol/L (ref 20–29)
Calcium: 9.5 mg/dL (ref 8.6–10.2)
Chloride: 102 mmol/L (ref 96–106)
Creatinine, Ser: 1.4 mg/dL — ABNORMAL HIGH (ref 0.76–1.27)
Glucose: 95 mg/dL (ref 65–99)
Potassium: 4.2 mmol/L (ref 3.5–5.2)
Sodium: 145 mmol/L — ABNORMAL HIGH (ref 134–144)
eGFR: 47 mL/min/{1.73_m2} — ABNORMAL LOW (ref >59)

## 2020-06-04 NOTE — Progress Notes (Signed)
Tried to call. Voicemail is full.

## 2020-06-05 ENCOUNTER — Other Ambulatory Visit: Payer: Self-pay | Admitting: *Deleted

## 2020-06-05 DIAGNOSIS — R600 Localized edema: Secondary | ICD-10-CM

## 2020-06-05 MED ORDER — TORSEMIDE 20 MG PO TABS: ORAL_TABLET | ORAL | 5 refills | Status: DC

## 2020-07-23 ENCOUNTER — Ambulatory Visit (INDEPENDENT_AMBULATORY_CARE_PROVIDER_SITE_OTHER): Payer: Medicare HMO | Admitting: Family Medicine

## 2020-07-23 ENCOUNTER — Encounter: Payer: Self-pay | Admitting: Family Medicine

## 2020-07-23 ENCOUNTER — Other Ambulatory Visit: Payer: Self-pay

## 2020-07-23 VITALS — BP 134/86 | HR 58 | Ht 70.0 in | Wt 179.0 lb

## 2020-07-23 DIAGNOSIS — M79641 Pain in right hand: Secondary | ICD-10-CM | POA: Diagnosis not present

## 2020-07-23 DIAGNOSIS — M7918 Myalgia, other site: Secondary | ICD-10-CM | POA: Diagnosis not present

## 2020-07-23 NOTE — Progress Notes (Signed)
   Subjective:    Patient ID: [name], male    DOB: 19-Oct-1925, 87 y.o.   MRN: [id number]  HPImed check up.  Patient relates he is having right hand pain and discomfort.  He wears a glove on it to give it support Has history of arthritis Because of his advanced age and is limited on what medicines he can take He also has a history of a sore on his buttock that he would like to be rechecked  Pt wants to follow up on hand pain and pain in buttock.     Review of Systems     Objective:   Physical Exam  Lungs clear respiratory rate normal heart regular pulse normal blood pressure 134/86 Buttock area has healed up sore Right hand has generalized arthritis     Assessment & Plan:  Right hand pain-arthritis- Tylenol for this.  Has mild renal insufficiency stay away from NSAIDs  Buttock sore- no sign of breakdown in the skin continue current measures try to use extra cushions on his seats  Follow-up within 3 to 4 months

## 2020-08-06 DIAGNOSIS — H6121 Impacted cerumen, right ear: Secondary | ICD-10-CM | POA: Diagnosis not present

## 2020-08-06 DIAGNOSIS — H903 Sensorineural hearing loss, bilateral: Secondary | ICD-10-CM | POA: Diagnosis not present

## 2020-08-17 ENCOUNTER — Other Ambulatory Visit: Payer: Self-pay | Admitting: Family Medicine

## 2020-09-13 ENCOUNTER — Other Ambulatory Visit: Payer: Self-pay | Admitting: Family Medicine

## 2020-10-21 ENCOUNTER — Telehealth: Payer: Self-pay | Admitting: Family Medicine

## 2020-10-21 NOTE — Telephone Encounter (Signed)
Nurse -error please close daughter wanting to make appointment instead of message

## 2020-11-05 ENCOUNTER — Encounter: Payer: Self-pay | Admitting: Family Medicine

## 2020-11-05 ENCOUNTER — Other Ambulatory Visit: Payer: Self-pay

## 2020-11-05 ENCOUNTER — Ambulatory Visit (INDEPENDENT_AMBULATORY_CARE_PROVIDER_SITE_OTHER): Payer: Medicare HMO | Admitting: Family Medicine

## 2020-11-05 VITALS — BP 125/78 | Temp 98.1°F | Wt 167.8 lb

## 2020-11-05 DIAGNOSIS — M7918 Myalgia, other site: Secondary | ICD-10-CM | POA: Diagnosis not present

## 2020-11-05 DIAGNOSIS — R600 Localized edema: Secondary | ICD-10-CM

## 2020-11-05 MED ORDER — TORSEMIDE 20 MG PO TABS: ORAL_TABLET | ORAL | 3 refills | Status: DC

## 2020-11-05 MED ORDER — POTASSIUM CHLORIDE ER 10 MEQ PO TBCR: EXTENDED_RELEASE_TABLET | ORAL | 3 refills | Status: DC

## 2020-11-05 NOTE — Patient Instructions (Signed)
Some fluid in the legs  Prop up as you can  Use furosemide 1 every morning  If needed for extra swelling may take an additional furosemide 3 days a week  On thos days take 2 potassiums twice a day  Do your labs today  Recheck here in 3 to 4 weeks  Call if issues

## 2020-11-05 NOTE — Progress Notes (Signed)
   Subjective:    Patient ID: [name], male    DOB: 05-24-1925, 87 y.o.   MRN: [id number]  HPI Pt having feet and leg swelling. Pt states feels like he is walking around on sand. They have been swelling for a while.  Patient denies chest tightness pressure pain or shortness of breath notices on some days his legs are very swollen  Pt states the boil on his rectum is still bothering him also.  Relates this area intermittently bothers him but is not draining anything   Review of Systems     Objective:   Physical Exam The area on the left buttock is more scar tissue I do not find any sign of abscess or infection or cancer going on monitor closely  Lungs are clear heart regular patient does have pedal edema lower leg edema more than likely related to his kidney disease as well as inactivity       Assessment & Plan:  1. Pedal edema Pedal edema will need furosemide 1 each day on days that it is more severe may take 2 Patient was instructed to do those extra doses on Monday Wednesday Friday when necessary When taking increased furosemide double up on the potassium Check lab work today follow-up in 4 weeks  2. Buttock pain Does not have abscess but does have risk of having a pressure sore patient to use a cushion

## 2020-11-06 LAB — BASIC METABOLIC PANEL
BUN/Creatinine Ratio: 9 — ABNORMAL LOW (ref 10–24)
BUN: 13 mg/dL (ref 10–36)
CO2: 28 mmol/L (ref 20–29)
Calcium: 9.4 mg/dL (ref 8.6–10.2)
Chloride: 100 mmol/L (ref 96–106)
Creatinine, Ser: 1.42 mg/dL — ABNORMAL HIGH (ref 0.76–1.27)
Glucose: 96 mg/dL (ref 65–99)
Potassium: 3.3 mmol/L — ABNORMAL LOW (ref 3.5–5.2)
Sodium: 143 mmol/L (ref 134–144)
eGFR: 46 mL/min/{1.73_m2} — ABNORMAL LOW (ref >59)

## 2020-11-06 LAB — HEPATIC FUNCTION PANEL
ALT: 21 IU/L (ref 0–44)
AST: 35 IU/L (ref 0–40)
Albumin: 4.4 g/dL (ref 3.5–4.6)
Alkaline Phosphatase: 74 IU/L (ref 44–121)
Bilirubin Total: 1.8 mg/dL — ABNORMAL HIGH (ref 0.0–1.2)
Bilirubin, Direct: 0.47 mg/dL — ABNORMAL HIGH (ref 0.00–0.40)
Total Protein: 6.9 g/dL (ref 6.0–8.5)

## 2020-11-07 ENCOUNTER — Telehealth: Payer: Self-pay | Admitting: *Deleted

## 2020-11-07 NOTE — Chronic Care Management (AMB) (Signed)
  Chronic Care Management   Outreach Note  11/07/2020 Name: [name] MRN: [id number] DOB: 09/06/1925  [name] is a 87 y.o. year old male who is a primary care patient of Luking, Elayne Snare, MD. I reached out to [name] by phone today in response to a referral sent by Mr. [name]'s PCP, Dr. Wolfgang Phoenix.      An unsuccessful telephone outreach was attempted today. The patient was referred to the case management team for assistance with care management and care coordination.   Follow Up Plan: A HIPAA compliant phone message was left for the patient providing contact information and requesting a return call. The care management team will reach out to the patient again over the next 7 days.  If patient returns call to provider office, please advise to call [facility] at [phone].  [facility]  Direct Dial: [phone]

## 2020-11-12 ENCOUNTER — Other Ambulatory Visit: Payer: Self-pay

## 2020-11-12 DIAGNOSIS — E876 Hypokalemia: Secondary | ICD-10-CM

## 2020-11-13 NOTE — Chronic Care Management (AMB) (Signed)
  Chronic Care Management   Note  11/13/2020 Name: [name] MRN: [id number] DOB: [birth year]  [name] is a 87 y.o. year old male who is a primary care patient of Luking, Elayne Snare, MD. I reached out to [name] by phone today in response to a referral sent by [name]'s PCP, Dr. Wolfgang Phoenix.      [name] was given information about Chronic Care Management services today including:  CCM service includes personalized support from designated clinical staff supervised by his physician, including individualized plan of care and coordination with other care providers 24/7 contact phone numbers for assistance for urgent and routine care needs. Service will only be billed when office clinical staff spend 20 minutes or more in a month to coordinate care. Only one practitioner may furnish and bill the service in a calendar month. The patient may stop CCM services at any time (effective at the end of the month) by phone call to the office staff. The patient will be responsible for cost sharing (co-pay) of up to 20% of the service fee (after annual deductible is met).  Patient agreed to services and verbal consent obtained.   Follow up plan: Telephone appointment with care management team member scheduled for:01/08/21  [facility]  Direct Dial: [phone]

## 2020-11-14 ENCOUNTER — Telehealth: Payer: Self-pay | Admitting: Family Medicine

## 2020-11-14 NOTE — Telephone Encounter (Signed)
Left message for patient to call back and schedule Medicare Annual Wellness Visit (AWV) in office.   If unable to come into the office for AWV,  please offer to do virtually or by telephone.  Last AWV: 01/13/2016  Please schedule at anytime with [facility] Advisor.  40 minute appointment  Any questions, please contact me at [phone]

## 2020-11-28 ENCOUNTER — Other Ambulatory Visit: Payer: Self-pay | Admitting: Family Medicine

## 2020-12-03 ENCOUNTER — Ambulatory Visit (INDEPENDENT_AMBULATORY_CARE_PROVIDER_SITE_OTHER): Payer: Medicare HMO | Admitting: Family Medicine

## 2020-12-03 ENCOUNTER — Other Ambulatory Visit: Payer: Self-pay

## 2020-12-03 VITALS — BP 148/86 | HR 78 | Temp 96.6°F | Wt 164.2 lb

## 2020-12-03 DIAGNOSIS — L602 Onychogryphosis: Secondary | ICD-10-CM

## 2020-12-03 DIAGNOSIS — R300 Dysuria: Secondary | ICD-10-CM | POA: Diagnosis not present

## 2020-12-03 DIAGNOSIS — N289 Disorder of kidney and ureter, unspecified: Secondary | ICD-10-CM | POA: Diagnosis not present

## 2020-12-03 DIAGNOSIS — E876 Hypokalemia: Secondary | ICD-10-CM

## 2020-12-03 LAB — POCT URINALYSIS DIPSTICK
Protein, UA: POSITIVE — AB
Spec Grav, UA: >=1.03 — AB (ref 1.010–1.025)
pH, UA: 7.5 (ref 5.0–8.0)

## 2020-12-03 NOTE — Progress Notes (Signed)
   Subjective:    Patient ID: [name], male    DOB: 09/07/1925, 87 y.o.   MRN: [id number]  HPI Pt here for follow up on pedal edema. Pt states swelling has decreased but legs feel weak. Last week noticed burning with urination.  Pt states he needs someone to cut his toenails  States his energy level not quite as good as it used to be.  Takes his medicine as directed.  He turned 87 years old today.  Review of Systems     Objective:   Physical Exam  Lungs clear heart regular pulse normal has some edema in the legs but not severe      Assessment & Plan:  Pedal edema continue diuretic Continue blood pressure medicine Referral to have his toenails clipped Check metabolic 7 to look at potassium Follow-up again in 3 months time Where his blood pressure is currently I would not recommend adding additional medicine  Urine does show some protein we will check a urine protein as well as metabolic 7

## 2020-12-04 LAB — MICROALBUMIN / CREATININE URINE RATIO
Creatinine, Urine: 185.7 mg/dL
Microalb/Creat Ratio: 12 mg/g creat (ref 0–29)
Microalbumin, Urine: 22.6 ug/mL

## 2020-12-10 ENCOUNTER — Other Ambulatory Visit: Payer: Self-pay

## 2020-12-10 ENCOUNTER — Ambulatory Visit (INDEPENDENT_AMBULATORY_CARE_PROVIDER_SITE_OTHER): Payer: Medicare HMO

## 2020-12-10 VITALS — Ht 70.0 in | Wt 164.0 lb

## 2020-12-10 DIAGNOSIS — Z0000 Encounter for general adult medical examination without abnormal findings: Secondary | ICD-10-CM

## 2020-12-10 NOTE — Progress Notes (Signed)
Subjective:   [name] is a 87 y.o. male who presents for Medicare Annual/Subsequent preventive examination. Virtual Visit via Telephone Note  I connected with  [name] on 12/10/20 at 10:20 AM EDT by telephone and verified that I am speaking with the correct person using two identifiers.  Location: Patient: HOME Provider: [facility] Persons participating in the virtual visit: patient/Nurse Health Advisor   I discussed the limitations, risks, security and privacy concerns of performing an evaluation and management service by telephone and the availability of in person appointments. The patient expressed understanding and agreed to proceed.  Interactive audio and video telecommunications were attempted between this nurse and patient, however failed, due to patient having technical difficulties OR patient did not have access to video capability.  We continued and completed visit with audio only.  Some vital signs may be absent or patient reported.   Chriss Driver, LPN  Review of Systems     Cardiac Risk Factors include: advanced age (>75men, >28 women);hypertension;dyslipidemia;male gender;sedentary lifestyle     Objective:    Today's Vitals   12/10/20 1040  Weight: 164 lb (74.4 kg)  Height: 5\' 10"  (1.778 m)   Body mass index is 23.53 kg/m.  Advanced Directives 12/10/2020 11/15/2019 11/13/2019 02/17/2018 02/08/2018 01/20/2018 01/12/2018  Does Patient Have a Medical Advance Directive? No No No No No No No  Would patient like information on creating a medical advance directive? No - Patient declined - - No - Patient declined No - Patient declined No - Patient declined No - Patient declined    Current Medications (verified) Outpatient Encounter Medications as of 12/10/2020  Medication Sig   acetaminophen (TYLENOL) 650 MG CR tablet Take 650-1,300 mg by mouth every 8 (eight) hours as needed for pain.   clonazePAM (KLONOPIN) 0.5 MG tablet 1 qhs prn if agitated at  bedtime   famotidine (PEPCID) 20 MG tablet TAKE (1) TABLET BY MOUTH TWICE DAILY.   mupirocin ointment (BACTROBAN) 2 % APPLY TO AFFECTED AREA TWICE DAILY   potassium chloride (KLOR-CON) 10 MEQ tablet Take 10 mEq by mouth 2 (two) times daily.   rosuvastatin (CRESTOR) 20 MG tablet TAKE ONE TABLET BY MOUTH ONCE DAILY.   torsemide (DEMADEX) 20 MG tablet Take one to two po each morning prn   VENTOLIN HFA 108 (90 Base) MCG/ACT inhaler INHALE 2 PUFFS EVERY 4 HOURS AS NEEDED.   [DISCONTINUED] potassium chloride (KLOR-CON) 10 MEQ tablet Take one to two tablet po BID depending on Furosemide dose   No facility-administered encounter medications on file as of 12/10/2020.    Allergies (verified) Penicillins   History: Past Medical History:  Diagnosis Date   Asthma    Diverticulosis    GERD (gastroesophageal reflux disease)    Gout    Heart murmur    Evaluation by a cardiologist years ago was reportedly negative   HTN (hypertension)    Osteoarthritis    Reflux    Past Surgical History:  Procedure Laterality Date   APPENDECTOMY  2007   COLONOSCOPY  2003   COLONOSCOPY N/A 12/28/2012   Procedure: COLONOSCOPY;  Surgeon: Rogene Houston, MD;  [facility];  Service: Endoscopy;  Laterality: N/A;  255-rescheduled to 10:30 Ann notified pt   EYE SURGERY Right    cataract removal   INGUINAL HERNIA REPAIR     Right   JOINT REPLACEMENT     right total knee RIGHT total knee arthroplasty   TOTAL KNEE ARTHROPLASTY  09/2009  Right   Family History  Problem Relation Age of Onset   Hypertension Mother    Hypertension Father    Heart attack Father    Cancer Brother        colon   Arthritis Other    Heart disease Other        Male < 51   Uterine cancer Maternal Aunt    Social History   Socioeconomic History   Marital status: Married    Spouse name: [name]   Number of children: Not on file   Years of education: Not on file   Highest education level: Not on file  Occupational  History   Occupation: Work at hospital  Tobacco Use   Smoking status: Former    Types: Pipe    Start date: 12/04/1939    Quit date: 1970    Years since quitting: 52.7   Smokeless tobacco: Never   Tobacco comments:    quit smoking cigarettes in 1971  Substance and Sexual Activity   Alcohol use: No   Drug use: No   Sexual activity: Not Currently  Other Topics Concern   Not on file  Social History Narrative   Married, lives with wife [name]. Daughter [name] helps patient.   Social Determinants of Health   Financial Resource Strain: Low Risk    Difficulty of Paying Living Expenses: Not hard at all  Food Insecurity: No Food Insecurity   Worried About Charity fundraiser in the Last Year: Never true   Interior in the Last Year: Never true  Transportation Needs: No Transportation Needs   Lack of Transportation (Medical): No   Lack of Transportation (Non-Medical): No  Physical Activity: Sufficiently Active   Days of Exercise per Week: 5 days   Minutes of Exercise per Session: 30 min  Stress: No Stress Concern Present   Feeling of Stress : Not at all  Social Connections: Moderately Integrated   Frequency of Communication with Friends and Family: More than three times a week   Frequency of Social Gatherings with Friends and Family: More than three times a week   Attends Religious Services: 1 to 4 times per year   Active Member of [employer] or Organizations: No   Attends Music therapist: Never   Marital Status: Married    Tobacco Counseling Counseling given: Not Answered Tobacco comments: quit smoking cigarettes in 1971   Clinical Intake:  Pre-visit preparation completed: Yes  Pain : No/denies pain     BMI - recorded: 23.53 Nutritional Status: BMI of 19-24  Normal Nutritional Risks: None Diabetes: No  How often do you need to have someone help you when you read instructions, pamphlets, or other written materials from your doctor or pharmacy?: 1 -  Never  Diabetic?NO  Interpreter Needed?: No  Information entered by :: Randal Buba, LPN   Activities of Daily Living In your present state of health, do you have any difficulty performing the following activities: 12/10/2020  Hearing? N  Vision? N  Difficulty concentrating or making decisions? N  Walking or climbing stairs? N  Dressing or bathing? N  Doing errands, shopping? N  Preparing Food and eating ? N  Using the Toilet? N  In the past six months, have you accidently leaked urine? N  Do you have problems with loss of bowel control? N  Managing your Medications? N  Managing your Finances? N  Some recent data might be hidden    Patient Care Team:  [name], MD as PCP - General (Family Medicine) Kassie Mends, RN as [place] any recent [place] you may have received from other than [facility] providers in the past year (date may be approximate).     Assessment:   This is a routine wellness examination for [employer].  Hearing/Vision screen Hearing Screening - Comments:: No hearing issues. Vision Screening - Comments:: Glasses. Overdue for eye exam. [facility].  Dietary issues and exercise activities discussed: Current Exercise Habits: Home exercise routine, Type of exercise: walking, Time (Minutes): 30, Frequency (Times/Week): 5, Weekly Exercise (Minutes/Week): 150, Intensity: Mild, Exercise limited by: cardiac condition(s);Other - see comments (Arthritis)   Goals Addressed             This Visit's Progress    Have 3 meals a day       Eat a healthy diet.       Depression Screen PHQ 2/9 Scores 12/10/2020 04/16/2020 10/30/2019 10/30/2019 03/30/2017 01/13/2016 01/11/2014  PHQ - 2 Score 0 0 1 1 4  0 0  PHQ- 9 Score - - 2 - 5 - -    Fall Risk Fall Risk  12/10/2020 04/16/2020 09/15/2019 05/24/2019 03/31/2019  Falls in the past year? 0 1 0 0 0  Number falls in past yr: 0 1 - - -  Injury with Fall? 0 0 - - -   Risk for fall due to : Impaired balance/gait;Impaired vision Impaired balance/gait - - Impaired balance/gait  Follow up Falls prevention discussed Falls evaluation completed;Education provided Falls evaluation completed Falls evaluation completed Falls evaluation completed    FALL RISK PREVENTION PERTAINING TO THE HOME:  Any stairs in or around the home? No  If so, are there any without handrails? No  Home free of loose throw rugs in walkways, pet beds, electrical cords, etc? Yes  Adequate lighting in your home to reduce risk of falls? Yes   ASSISTIVE DEVICES UTILIZED TO PREVENT FALLS:  Life alert? No  Use of a cane, walker or w/c? Yes  Grab bars in the bathroom? Yes  Shower chair or bench in shower? Yes  Elevated toilet seat or a handicapped toilet? Yes   TIMED UP AND GO:  Was the test performed? No . Phone visit.     Cognitive Function: Normal cognitive status assessed by direct observation by this Nurse Health Advisor. No abnormalities found.          Immunizations Immunization History  Administered Date(s) Administered   Fluad Quad(high Dose 65+) 01/10/2020   Influenza, High Dose Seasonal PF 02/03/2019   Influenza, Seasonal, Injecte, Preservative Fre 12/25/2015   Influenza,inj,Quad PF,6+ Mos 01/11/2014, 12/27/2014, 12/22/2016, 01/17/2018   Pneumococcal Conjugate-13 01/11/2014   Pneumococcal Polysaccharide-23 06/24/2012   Unspecified SARS-COV-2 Vaccination 07/15/2019, 08/15/2019, 03/14/2020    TDAP status: Due, Education has been provided regarding the importance of this vaccine. Advised may receive this vaccine at local pharmacy or Health Dept. Aware to provide a copy of the vaccination record if obtained from local pharmacy or Health Dept. Verbalized acceptance and understanding.  Flu Vaccine status: Due, Education has been provided regarding the importance of this vaccine. Advised may receive this vaccine at local pharmacy or Health Dept. Aware to provide a copy  of the vaccination record if obtained from local pharmacy or Health Dept. Verbalized acceptance and understanding.  Pneumococcal vaccine status: Up to date  Covid-19 vaccine status: Completed vaccines  Qualifies for Shingles Vaccine? Yes   Zostavax completed No   Shingrix Completed?:  No.    Education has been provided regarding the importance of this vaccine. Patient has been advised to call insurance company to determine out of pocket expense if they have not yet received this vaccine. Advised may also receive vaccine at local pharmacy or Health Dept. Verbalized acceptance and understanding.  Screening Tests Health Maintenance  Topic Date Due   TETANUS/TDAP  Never done   Zoster Vaccines- Shingrix (1 of 2) Never done   COVID-19 Vaccine (4 - Booster) 06/06/2020   INFLUENZA VACCINE  10/14/2020   HPV VACCINES  Aged Out    Health Maintenance  Health Maintenance Due  Topic Date Due   TETANUS/TDAP  Never done   Zoster Vaccines- Shingrix (1 of 2) Never done   COVID-19 Vaccine (4 - Booster) 06/06/2020   INFLUENZA VACCINE  10/14/2020    Colorectal cancer screening: No longer required.   Lung Cancer Screening: (Low Dose CT Chest recommended if Age 52-80 years, 30 pack-year currently smoking OR have quit w/in 15years.) does not qualify.    Additional Screening:  Hepatitis C Screening: does not qualify  Vision Screening: Recommended annual ophthalmology exams for early detection of glaucoma and other disorders of the eye. Is the patient up to date with their annual eye exam?  No  Daughter, [name], will call to schedule. Who is the provider or what is the name of the office in which the patient attends annual eye exams? [facility] If pt is not established with a provider, would they like to be referred to a provider to establish care? No .   Dental Screening: Recommended annual dental exams for proper oral hygiene  Community Resource Referral / Chronic Care Management: CRR  required this visit?  No   CCM required this visit?  No      Plan:     I have personally reviewed and noted the following in the patient's chart:   Medical and social history Use of alcohol, tobacco or illicit drugs  Current medications and supplements including opioid prescriptions. Patient is not currently taking opioid prescriptions. Functional ability and status Nutritional status Physical activity Advanced directives List of other physicians Hospitalizations, surgeries, and ER visits in previous 12 months Vitals Screenings to include cognitive, depression, and falls Referrals and appointments  In addition, I have reviewed and discussed with patient certain preventive protocols, quality metrics, and best practice recommendations. A written personalized care plan for preventive services as well as general preventive health recommendations were provided to patient.     Chriss Driver, LPN   [id number]/[id number]   Nurse Notes: Pt doing well. Visit done with patient and daughter, [name]. Discussed Td and shingles vaccines. Due for flu vaccine and eye exam. [name] states she will get father scheduled for both.

## 2020-12-10 NOTE — Patient Instructions (Signed)
[name] , Thank you for taking time to come for your Medicare Wellness Visit. I appreciate your ongoing commitment to your health goals. Please review the following plan we discussed and let me know if I can assist you in the future.   Screening recommendations/referrals: Colonoscopy: No longer required due to age. Recommended yearly ophthalmology/optometry visit for glaucoma screening and checkup Recommended yearly dental visit for hygiene and checkup  Vaccinations: Influenza vaccine: Done 01/10/2020. Repeat annually  Pneumococcal vaccine: Done 06/24/2012 and 01/11/2014 Tdap vaccine: Due. Repeat in 10 years  Shingles vaccine: Shingrix discussed. Please contact your pharmacy for coverage information.     Covid-19: Done 07/15/19, 08/15/19, 03/14/20.  Advanced directives: Advance directive discussed with you today. Even though you declined this today, please call our office should you change your mind, and we can give you the proper paperwork for you to fill out.   Conditions/risks identified: Aim for 30 minutes of exercise or  walking each day, drink 6-8 glasses of water and eat lots of fruits and vegetables.   Next appointment: Follow up in one year for your annual wellness visit. 2023.  Preventive Care 87 Years and Older, Male  Preventive care refers to lifestyle choices and visits with your health care provider that can promote health and wellness. What does preventive care include? A yearly physical exam. This is also called an annual well check. Dental exams once or twice a year. Routine eye exams. Ask your health care provider how often you should have your eyes checked. Personal lifestyle choices, including: Daily care of your teeth and gums. Regular physical activity. Eating a healthy diet. Avoiding tobacco and drug use. Limiting alcohol use. Practicing safe sex. Taking low doses of aspirin every day. Taking vitamin and mineral supplements as recommended by your  health care provider. What happens during an annual well check? The services and screenings done by your health care provider during your annual well check will depend on your age, overall health, lifestyle risk factors, and family history of disease. Counseling  Your health care provider may ask you questions about your: Alcohol use. Tobacco use. Drug use. Emotional well-being. Home and relationship well-being. Sexual activity. Eating habits. History of falls. Memory and ability to understand (cognition). Work and work Statistician. Screening  You may have the following tests or measurements: Height, weight, and BMI. Blood pressure. Lipid and cholesterol levels. These may be checked every 5 years, or more frequently if you are over 66 years old. Skin check. Lung cancer screening. You may have this screening every year starting at age 48 if you have a 30-pack-year history of smoking and currently smoke or have quit within the past 15 years. Fecal occult blood test (FOBT) of the stool. You may have this test every year starting at age 35. Flexible sigmoidoscopy or colonoscopy. You may have a sigmoidoscopy every 5 years or a colonoscopy every 10 years starting at age 74. Prostate cancer screening. Recommendations will vary depending on your family history and other risks. Hepatitis C blood test. Hepatitis B blood test. Sexually transmitted disease (STD) testing. Diabetes screening. This is done by checking your blood sugar (glucose) after you have not eaten for a while (fasting). You may have this done every 1-3 years. Abdominal aortic aneurysm (AAA) screening. You may need this if you are a current or former smoker. Osteoporosis. You may be screened starting at age 21 if you are at high risk. Talk with your health care provider about your test results, treatment options,  and if necessary, the need for more tests. Vaccines  Your health care provider may recommend certain vaccines, such  as: Influenza vaccine. This is recommended every year. Tetanus, diphtheria, and acellular pertussis (Tdap, Td) vaccine. You may need a Td booster every 10 years. Zoster vaccine. You may need this after age 75. Pneumococcal 13-valent conjugate (PCV13) vaccine. One dose is recommended after age 98. Pneumococcal polysaccharide (PPSV23) vaccine. One dose is recommended after age 60. Talk to your health care provider about which screenings and vaccines you need and how often you need them. This information is not intended to replace advice given to you by your health care provider. Make sure you discuss any questions you have with your health care provider. Document Released: 03/29/2015 Document Revised: 11/20/2015 Document Reviewed: 01/01/2015 Elsevier Interactive Patient Education  2017 [place] Prevention in the Home Falls can cause injuries. They can happen to people of all ages. There are many things you can do to make your home safe and to help prevent falls. What can I do on the outside of my home? Regularly fix the edges of walkways and driveways and fix any cracks. Remove anything that might make you trip as you walk through a door, such as a raised step or threshold. Trim any bushes or trees on the path to your home. Use bright outdoor lighting. Clear any walking paths of anything that might make someone trip, such as rocks or tools. Regularly check to see if handrails are loose or broken. Make sure that both sides of any steps have handrails. Any raised decks and porches should have guardrails on the edges. Have any leaves, snow, or ice cleared regularly. Use sand or salt on walking paths during winter. Clean up any spills in your garage right away. This includes oil or grease spills. What can I do in the bathroom? Use night lights. Install grab bars by the toilet and in the tub and shower. Do not use towel bars as grab bars. Use non-skid mats or decals in the tub or  shower. If you need to sit down in the shower, use a plastic, non-slip stool. Keep the floor dry. Clean up any water that spills on the floor as soon as it happens. Remove soap buildup in the tub or shower regularly. Attach bath mats securely with double-sided non-slip rug tape. Do not have throw rugs and other things on the floor that can make you trip. What can I do in the bedroom? Use night lights. Make sure that you have a light by your bed that is easy to reach. Do not use any sheets or blankets that are too big for your bed. They should not hang down onto the floor. Have a firm chair that has side arms. You can use this for support while you get dressed. Do not have throw rugs and other things on the floor that can make you trip. What can I do in the kitchen? Clean up any spills right away. Avoid walking on wet floors. Keep items that you use a lot in easy-to-reach places. If you need to reach something above you, use a strong step stool that has a grab bar. Keep electrical cords out of the way. Do not use floor polish or wax that makes floors slippery. If you must use wax, use non-skid floor wax. Do not have throw rugs and other things on the floor that can make you trip. What can I do with my stairs? Do not leave  any items on the stairs. Make sure that there are handrails on both sides of the stairs and use them. Fix handrails that are broken or loose. Make sure that handrails are as long as the stairways. Check any carpeting to make sure that it is firmly attached to the stairs. Fix any carpet that is loose or worn. Avoid having throw rugs at the top or bottom of the stairs. If you do have throw rugs, attach them to the floor with carpet tape. Make sure that you have a light switch at the top of the stairs and the bottom of the stairs. If you do not have them, ask someone to add them for you. What else can I do to help prevent falls? Wear shoes that: Do not have high heels. Have  rubber bottoms. Are comfortable and fit you well. Are closed at the toe. Do not wear sandals. If you use a stepladder: Make sure that it is fully opened. Do not climb a closed stepladder. Make sure that both sides of the stepladder are locked into place. Ask someone to hold it for you, if possible. Clearly mark and make sure that you can see: Any grab bars or handrails. First and last steps. Where the edge of each step is. Use tools that help you move around (mobility aids) if they are needed. These include: Canes. Walkers. Scooters. Crutches. Turn on the lights when you go into a dark area. Replace any light bulbs as soon as they burn out. Set up your furniture so you have a clear path. Avoid moving your furniture around. If any of your floors are uneven, fix them. If there are any pets around you, be aware of where they are. Review your medicines with your doctor. Some medicines can make you feel dizzy. This can increase your chance of falling. Ask your doctor what other things that you can do to help prevent falls. This information is not intended to replace advice given to you by your health care provider. Make sure you discuss any questions you have with your health care provider. Document Released: 12/27/2008 Document Revised: 08/08/2015 Document Reviewed: 04/06/2014 Elsevier Interactive Patient Education  2017 [employer].

## 2021-01-07 ENCOUNTER — Other Ambulatory Visit: Payer: Self-pay | Admitting: Family Medicine

## 2021-01-08 ENCOUNTER — Telehealth: Payer: Medicare HMO

## 2021-01-08 ENCOUNTER — Telehealth: Payer: Self-pay | Admitting: *Deleted

## 2021-01-08 NOTE — Chronic Care Management (AMB) (Signed)
  Care Management   Note  01/08/2021 Name: [name] MRN: [id number] DOB: January 09, 1926  [name] is a 87 y.o. year old male who is a primary care patient of Luking, Elayne Snare, MD and is actively engaged with the care management team. I reached out to [name] by phone today to assist with re-scheduling an initial visit with the RN Case Manager  Follow up plan: Unsuccessful telephone outreach attempt made. A HIPAA compliant phone message was left for the patient providing contact information and requesting a return call.  The care management team will reach out to the patient again over the next 7 days.  If patient returns call to provider office, please advise to call [place] at [phone].  [place] Management  Direct Dial: [phone]

## 2021-01-09 NOTE — Chronic Care Management (AMB) (Signed)
  Care Management   Note  01/09/2021 Name: [name] MRN: [id number] DOB: 02/21/1926  [name] is a 87 y.o. year old male who is a primary care patient of Luking, Elayne Snare, MD and is actively engaged with the care management team. I reached out to [name] by phone today to assist with re-scheduling an initial visit with the RN Case Manager  Follow up plan: Telephone appointment with care management team member scheduled for:01/29/21  [place] Management  Direct Dial: [phone]

## 2021-01-22 ENCOUNTER — Other Ambulatory Visit (INDEPENDENT_AMBULATORY_CARE_PROVIDER_SITE_OTHER): Payer: Medicare HMO

## 2021-01-22 ENCOUNTER — Other Ambulatory Visit: Payer: Self-pay

## 2021-01-22 DIAGNOSIS — Z23 Encounter for immunization: Secondary | ICD-10-CM

## 2021-01-29 ENCOUNTER — Ambulatory Visit (INDEPENDENT_AMBULATORY_CARE_PROVIDER_SITE_OTHER): Payer: Medicare HMO | Admitting: *Deleted

## 2021-01-29 DIAGNOSIS — I10 Essential (primary) hypertension: Secondary | ICD-10-CM

## 2021-01-29 DIAGNOSIS — E7849 Other hyperlipidemia: Secondary | ICD-10-CM

## 2021-01-29 NOTE — Patient Instructions (Signed)
Visit Information   PATIENT GOALS/PLAN OF CARE:  Care Plan : RN Care Manager plan of care  Updates made by Kassie Mends, RN since 01/29/2021 12:00 AM     Problem: No plan of care established for management of chronic disease states (HTN, HLD)   Priority: High     Long-Range Goal: Development of plan of care for chronic disease management (HTN, HLD)   Start Date: 01/29/2021  Expected End Date: 07/28/2021  Priority: High  Note:   Current Barriers:  Knowledge Deficits related to plan of care for management of HTN  Spoke with patient's wife and daughter Ava, per Ava pt lives with spouse and Ava lives 5 minutes away and checks in on her parents.  Patient is independent with most all aspects of his care and continues to drive.  Patient does not have a blood pressure cuff and does not monitor blood pressure (daughter states they can get a cuff).  Patient did receive flu vaccine.  Patient does not have Advanced Directives- daughter requests packet be mailed.  RNCM Clinical Goal(s):  Patient will verbalize understanding of plan for management of HTN and HLD as evidenced by patient report, review EHR attend all scheduled medical appointments: 01/13/22 annual wellness visit as evidenced by patient report, review EHR and        through collaboration with Consulting civil engineer, provider, and care team.   Interventions: 1:1 collaboration with primary care provider regarding development and update of comprehensive plan of care as evidenced by provider attestation and co-signature Inter-disciplinary care team collaboration (see longitudinal plan of care) Evaluation of current treatment plan related to  self management and patient's adherence to plan as established by provider  Hypertension Interventions: Last practice recorded BP readings:  BP Readings from Last 3 Encounters:  12/03/20 (!) 148/86  11/05/20 125/78  07/23/20 134/86  Most recent eGFR/CrCl:  Lab Results  Component Value Date   EGFR 46  (L) 11/05/2020    No components found for: CRCL  Evaluation of current treatment plan related to hypertension self management and patient's adherence to plan as established by provider; Provided education to patient re: stroke prevention, s/s of heart attack and stroke; Reviewed medications with patient and discussed importance of compliance; Discussed plans with patient for ongoing care management follow up and provided patient with direct contact information for care management team; Advised patient, providing education and rationale, to monitor blood pressure daily and record, calling PCP for findings outside established parameters;  Reviewed scheduled/upcoming provider appointments including:  annual wellness visit 01/13/2022 Reviewed low sodium diet and provided education via My Chart Mailed advanced directives packet to patient's home  Reviewed importance of good handwashing and wearing a mask as needed  Hyperlipidemia:  (Status: New goal. Goal on Track (progressing): YES.) Long Term Goal  Lab Results  Component Value Date   CHOL 120 04/17/2020   HDL 76 04/17/2020   LDLCALC 29 04/17/2020   TRIG 73 04/17/2020   CHOLHDL 1.6 04/17/2020    Medication review performed; medication list updated in electronic medical record.  Provider established cholesterol goals reviewed; Counseled on importance of regular laboratory monitoring as prescribed; Reviewed role and benefits of statin for ASCVD risk reduction; Reviewed importance of limiting foods high in cholesterol; Screening for signs and symptoms of depression related to chronic disease state;  Assessed social determinant of health barriers;  Encouraged exercise and getting outdoors daily  Patient Goals/Self-Care Activities: Patient will self administer medications as prescribed as evidenced by self report/primary  caregiver report  Patient will attend all scheduled provider appointments as evidenced by clinician review of documented  attendance to scheduled appointments and patient/caregiver report Patient will continue to perform ADL's independently as evidenced by patient/caregiver report Patient will call provider office for new concerns or questions as evidenced by review of documented incoming telephone call notes and patient report Obtain blood pressure cuff and start checking blood pressure at least 3 x per week Practice good handwashing and wear a mask as needed Follow a low sodium diet- read food labels and avoid salty snacks Look over education sent via My Chart- low sodium diet Bake or broil foods instead of frying Avoid trans/ saturated fats in your diet Look over advanced directives packet mailed to you Call RN care manager if any questions at [phone]   Follow Up Plan:  Telephone follow up appointment with care management team member scheduled for:  04/09/2021       Consent to CCM Services: Mr. Kersten was given information about Chronic Care Management services including:  CCM service includes personalized support from designated clinical staff supervised by his physician, including individualized plan of care and coordination with other care providers 24/7 contact phone numbers for assistance for urgent and routine care needs. Service will only be billed when office clinical staff spend 20 minutes or more in a month to coordinate care. Only one practitioner may furnish and bill the service in a calendar month. The patient may stop CCM services at any time (effective at the end of the month) by phone call to the office staff. The patient will be responsible for cost sharing (co-pay) of up to 20% of the service fee (after annual deductible is met).  Patient agreed to services and verbal consent obtained.   Patient verbalizes understanding of instructions provided today and agrees to view in [place].   Telephone follow up appointment with care management team member scheduled for:  1/5/2023Advance  Directive Advance directives are legal documents that allow you to make decisions about your health care and medical treatment in case you become unable to communicate for yourself. Advance directives let your wishes be known to family, friends, and health care providers. Discussing and writing advance directives should happen over time rather than all at once. Advance directives can be changed and updated at any time. There are different types of advance directives, such as: Medical power of attorney. Living will. Do not resuscitate (DNR) order or do not attempt resuscitation (DNAR) order. Health care proxy and medical power of attorney A health care proxy is also called a health care agent. This person is appointed to make medical decisions for you when you are unable to make decisions for yourself. Generally, people ask a trusted friend or family member to act as their proxy and represent their preferences. Make sure you have an agreement with your trusted person to act as your proxy. A proxy may have to make a medical decision on your behalf if your wishes are not known. A medical power of attorney, also called a durable power of attorney for health care, is a legal document that names your health care proxy. Depending on the laws in your state, the document may need to be: Signed. Notarized. Dated. Copied. Witnessed. Incorporated into your medical record. You may also want to appoint a trusted person to manage your money in the event you are unable to do so. This is called a durable power of attorney for finances. It is a separate legal document  from the durable power of attorney for health care. You may choose your health care proxy or someone different to act as your agent in money matters. If you do not appoint a proxy, or there is a concern that the proxy is not acting in your best interest, a court may appoint a guardian to act on your behalf. Living will A living will is a set of  instructions that state your wishes about medical care when you cannot express them yourself. Health care providers should keep a copy of your living will in your medical record. You may want to give a copy to family members or friends. To alert caregivers in case of an emergency, you can place a card in your wallet to let them know that you have a living will and where they can find it. A living will is used if you become: Terminally ill. Disabled. Unable to communicate or make decisions. The following decisions should be included in your living will: To use or not to use life support equipment, such as dialysis machines and breathing machines (ventilators). Whether you want a DNR or DNAR order. This tells health care providers not to use cardiopulmonary resuscitation (CPR) if breathing or heartbeat stops. To use or not to use tube feeding. To be given or not to be given food and fluids. Whether you want comfort (palliative) care when the goal becomes comfort rather than a cure. Whether you want to donate your organs and tissues. A living will does not give instructions for distributing your money and property if you should pass away. DNR or DNAR A DNR or DNAR order is a request not to have CPR in the event that your heart stops beating or you stop breathing. If a DNR or DNAR order has not been made and shared, a health care provider will try to help any patient whose heart has stopped or who has stopped breathing. If you plan to have surgery, talk with your health care provider about how your DNR or DNAR order will be followed if problems occur. What if I do not have an advance directive? Some states assign family decision makers to act on your behalf if you do not have an advance directive. Each state has its own laws about advance directives. You may want to check with your health care provider, attorney, or state representative about the laws in your state. Summary Advance directives are legal  documents that allow you to make decisions about your health care and medical treatment in case you become unable to communicate for yourself. The process of discussing and writing advance directives should happen over time. You can change and update advance directives at any time. Advance directives may include a medical power of attorney, a living will, and a DNR or DNAR order. This information is not intended to replace advice given to you by your health care provider. Make sure you discuss any questions you have with your health care provider. Document Revised: 12/05/2019 Document Reviewed: 12/05/2019 Elsevier Patient Education  [place]. Low-Sodium Eating Plan Sodium, which is an element that makes up salt, helps you maintain a healthy balance of fluids in your body. Too much sodium can increase your blood pressure and cause fluid and waste to be held in your body. Your health care provider or dietitian may recommend following this plan if you have high blood pressure (hypertension), kidney disease, liver disease, or heart failure. Eating less sodium can help lower your blood pressure, reduce  swelling, and protect your heart, liver, and kidneys. What are tips for following this plan? Reading food labels The Nutrition Facts label lists the amount of sodium in one serving of the food. If you eat more than one serving, you must multiply the listed amount of sodium by the number of servings. Choose foods with less than 140 mg of sodium per serving. Avoid foods with 300 mg of sodium or more per serving. Shopping  Look for lower-sodium products, often labeled as "low-sodium" or "no salt added." Always check the sodium content, even if foods are labeled as "unsalted" or "no salt added." Buy fresh foods. Avoid canned foods and pre-made or frozen meals. Avoid canned, cured, or processed meats. Buy breads that have less than 80 mg of sodium per slice. Cooking  Eat more home-cooked food  and less restaurant, buffet, and fast food. Avoid adding salt when cooking. Use salt-free seasonings or herbs instead of table salt or sea salt. Check with your health care provider or pharmacist before using salt substitutes. Cook with plant-based oils, such as canola, sunflower, or olive oil. Meal planning When eating at a restaurant, ask that your food be prepared with less salt or no salt, if possible. Avoid dishes labeled as brined, pickled, cured, smoked, or made with soy sauce, miso, or teriyaki sauce. Avoid foods that contain MSG (monosodium glutamate). MSG is sometimes added to [place] food, bouillon, and some canned foods. Make meals that can be grilled, baked, poached, roasted, or steamed. These are generally made with less sodium. General information Most people on this plan should limit their sodium intake to 1,500-2,000 mg (milligrams) of sodium each day. What foods should I eat? Fruits Fresh, frozen, or canned fruit. Fruit juice. Vegetables Fresh or frozen vegetables. "No salt added" canned vegetables. "No salt added" tomato sauce and paste. Low-sodium or reduced-sodium tomato and vegetable juice. Grains Low-sodium cereals, including oats, puffed wheat and rice, and shredded wheat. Low-sodium crackers. Unsalted rice. Unsalted pasta. Low-sodium bread. Whole-grain breads and whole-grain pasta. Meats and other proteins Fresh or frozen (no salt added) meat, poultry, seafood, and fish. Low-sodium canned tuna and salmon. Unsalted nuts. Dried peas, beans, and lentils without added salt. Unsalted canned beans. Eggs. Unsalted nut butters. Dairy Milk. Soy milk. Cheese that is naturally low in sodium, such as ricotta cheese, fresh mozzarella, or Swiss cheese. Low-sodium or reduced-sodium cheese. Cream cheese. Yogurt. Seasonings and condiments Fresh and dried herbs and spices. Salt-free seasonings. Low-sodium mustard and ketchup. Sodium-free salad dressing. Sodium-free light mayonnaise. Fresh  or refrigerated horseradish. Lemon juice. Vinegar. Other foods Homemade, reduced-sodium, or low-sodium soups. Unsalted popcorn and pretzels. Low-salt or salt-free chips. The items listed above may not be a complete list of foods and beverages you can eat. Contact a dietitian for more information. What foods should I avoid? Vegetables Sauerkraut, pickled vegetables, and relishes. Olives. [place] fries. Onion rings. Regular canned vegetables (not low-sodium or reduced-sodium). Regular canned tomato sauce and paste (not low-sodium or reduced-sodium). Regular tomato and vegetable juice (not low-sodium or reduced-sodium). Frozen vegetables in sauces. Grains Instant hot cereals. Bread stuffing, pancake, and biscuit mixes. Croutons. Seasoned rice or pasta mixes. Noodle soup cups. Boxed or frozen macaroni and cheese. Regular salted crackers. Self-rising flour. Meats and other proteins Meat or fish that is salted, canned, smoked, spiced, or pickled. Precooked or cured meat, such as sausages or meat loaves. Berniece Salines. Ham. Pepperoni. Hot dogs. Corned beef. Chipped beef. Salt pork. Jerky. Pickled herring. Anchovies and sardines. Regular canned tuna. Salted nuts. Dairy  Processed cheese and cheese spreads. Hard cheeses. Cheese curds. Blue cheese. Feta cheese. String cheese. Regular cottage cheese. Buttermilk. Canned milk. Fats and oils Salted butter. Regular margarine. Ghee. Bacon fat. Seasonings and condiments Onion salt, garlic salt, seasoned salt, table salt, and sea salt. Canned and packaged gravies. Worcestershire sauce. Tartar sauce. Barbecue sauce. Teriyaki sauce. Soy sauce, including reduced-sodium. Steak sauce. Fish sauce. Oyster sauce. Cocktail sauce. Horseradish that you find on the shelf. Regular ketchup and mustard. Meat flavorings and tenderizers. Bouillon cubes. Hot sauce. Pre-made or packaged marinades. Pre-made or packaged taco seasonings. Relishes. Regular salad dressings. Salsa. Other foods Salted  popcorn and pretzels. Corn chips and puffs. Potato and tortilla chips. Canned or dried soups. Pizza. Frozen entrees and pot pies. The items listed above may not be a complete list of foods and beverages you should avoid. Contact a dietitian for more information. Summary Eating less sodium can help lower your blood pressure, reduce swelling, and protect your heart, liver, and kidneys. Most people on this plan should limit their sodium intake to 1,500-2,000 mg (milligrams) of sodium each day. Canned, boxed, and frozen foods are high in sodium. Restaurant foods, fast foods, and pizza are also very high in sodium. You also get sodium by adding salt to food. Try to cook at home, eat more fresh fruits and vegetables, and eat less fast food and canned, processed, or prepared foods. This information is not intended to replace advice given to you by your health care provider. Make sure you discuss any questions you have with your health care provider. Document Revised: 04/07/2019 Document Reviewed: 02/01/2019 Elsevier Patient Education  2022 [place]   Jacqlyn Larsen [facility], BSN RN Case Advertising copywriter Family Medicine [phone]

## 2021-01-29 NOTE — Chronic Care Management (AMB) (Signed)
Chronic Care Management   CCM RN Visit Note  01/29/2021 Name: [name] MRN: [id number] DOB: 03-31-1925  Subjective: [name] is a 87 y.o. year old male who is a primary care patient of Luking, Elayne Snare, MD. The care management team was consulted for assistance with disease management and care coordination needs.    Engaged with patient by telephone for initial visit in response to provider referral for case management and/or care coordination services.   Consent to Services:  The patient was given the following information about Chronic Care Management services today, agreed to services, and gave verbal consent: 1. CCM service includes personalized support from designated clinical staff supervised by the primary care provider, including individualized plan of care and coordination with other care providers 2. 24/7 contact phone numbers for assistance for urgent and routine care needs. 3. Service will only be billed when office clinical staff spend 20 minutes or more in a month to coordinate care. 4. Only one practitioner may furnish and bill the service in a calendar month. 5.The patient may stop CCM services at any time (effective at the end of the month) by phone call to the office staff. 6. The patient will be responsible for cost sharing (co-pay) of up to 20% of the service fee (after annual deductible is met). Patient agreed to services and consent obtained.  Patient agreed to services and verbal consent obtained.   Assessment: Review of patient past medical history, allergies, medications, health status, including review of consultants reports, laboratory and other test data, was performed as part of comprehensive evaluation and provision of chronic care management services.   SDOH (Social Determinants of Health) assessments and interventions performed:  SDOH Interventions    Flowsheet Row Most Recent Value  SDOH Interventions   Food Insecurity Interventions  Intervention Not Indicated  Transportation Interventions Intervention Not Indicated        CCM Care Plan  Allergies  Allergen Reactions   Penicillins Rash    Outpatient Encounter Medications as of 01/29/2021  Medication Sig   acetaminophen (TYLENOL) 650 MG CR tablet Take 650-1,300 mg by mouth every 8 (eight) hours as needed for pain.   clonazePAM (KLONOPIN) 0.5 MG tablet 1 qhs prn if agitated at bedtime   famotidine (PEPCID) 20 MG tablet TAKE (1) TABLET BY MOUTH TWICE DAILY.   mupirocin ointment (BACTROBAN) 2 % APPLY TO AFFECTED AREA TWICE DAILY   potassium chloride (KLOR-CON) 10 MEQ tablet Take 10 mEq by mouth 2 (two) times daily.   rosuvastatin (CRESTOR) 20 MG tablet TAKE ONE TABLET BY MOUTH ONCE DAILY.   torsemide (DEMADEX) 20 MG tablet Take one to two po each morning prn   VENTOLIN HFA 108 (90 Base) MCG/ACT inhaler INHALE 2 PUFFS EVERY 4 HOURS AS NEEDED.   No facility-administered encounter medications on file as of 01/29/2021.    Patient Active Problem List   Diagnosis Date Noted   Pedal edema 02/12/2020   DOE (dyspnea on exertion) 02/12/2020   Peripheral edema 01/29/2020   S/P total knee replacement, left 06/09/10 01/03/2018   Chest pain 09/24/2016   AAA (abdominal aortic aneurysm) 09/24/2016   Cognitive dysfunction 12/27/2014   Hyperlipidemia 12/27/2014   Gout 01/11/2014   HTN (hypertension) 01/11/2014   Anemia, iron deficiency 04/03/2013   S/P total knee replacement, right 09/21/09 09/16/2010   GASTROESOPHAGEAL REFLUX DISEASE 05/08/2010   DIVERTICULOSIS, COLON 05/08/2010   TOBACCO ABUSE 05/08/2010   KNEE, ARTHRITIS, DEGEN./OSTEO 04/05/2008    Conditions to be addressed/monitored:HTN and  HLD  Care Plan : RN Care Manager plan of care  Updates made by Kassie Mends, RN since 01/29/2021 12:00 AM     Problem: No plan of care established for management of chronic disease states (HTN, HLD)   Priority: High     Long-Range Goal: Development of plan of care for  chronic disease management (HTN, HLD)   Start Date: 01/29/2021  Expected End Date: 07/28/2021  Priority: High  Note:   Current Barriers:  Knowledge Deficits related to plan of care for management of HTN  Spoke with patient's wife and daughter Ava, per Ava pt lives with spouse and Ava lives 5 minutes away and checks in on her parents.  Patient is independent with most all aspects of his care and continues to drive.  Patient does not have a blood pressure cuff and does not monitor blood pressure (daughter states they can get a cuff).  Patient did receive flu vaccine.  Patient does not have Advanced Directives- daughter requests packet be mailed.  RNCM Clinical Goal(s):  Patient will verbalize understanding of plan for management of HTN and HLD as evidenced by patient report, review EHR attend all scheduled medical appointments: 01/13/22 annual wellness visit as evidenced by patient report, review EHR and        through collaboration with Consulting civil engineer, provider, and care team.   Interventions: 1:1 collaboration with primary care provider regarding development and update of comprehensive plan of care as evidenced by provider attestation and co-signature Inter-disciplinary care team collaboration (see longitudinal plan of care) Evaluation of current treatment plan related to  self management and patient's adherence to plan as established by provider  Hypertension Interventions: Last practice recorded BP readings:  BP Readings from Last 3 Encounters:  12/03/20 (!) 148/86  11/05/20 125/78  07/23/20 134/86  Most recent eGFR/CrCl:  Lab Results  Component Value Date   EGFR 46 (L) 11/05/2020    No components found for: CRCL  Evaluation of current treatment plan related to hypertension self management and patient's adherence to plan as established by provider; Provided education to patient re: stroke prevention, s/s of heart attack and stroke; Reviewed medications with patient and discussed  importance of compliance; Discussed plans with patient for ongoing care management follow up and provided patient with direct contact information for care management team; Advised patient, providing education and rationale, to monitor blood pressure daily and record, calling PCP for findings outside established parameters;  Reviewed scheduled/upcoming provider appointments including:  annual wellness visit 01/13/2022 Reviewed low sodium diet and provided education via My Chart Mailed advanced directives packet to patient's home  Reviewed importance of good handwashing and wearing a mask as needed  Hyperlipidemia:  (Status: New goal. Goal on Track (progressing): YES.) Long Term Goal  Lab Results  Component Value Date   CHOL 120 04/17/2020   HDL 76 04/17/2020   LDLCALC 29 04/17/2020   TRIG 73 04/17/2020   CHOLHDL 1.6 04/17/2020    Medication review performed; medication list updated in electronic medical record.  Provider established cholesterol goals reviewed; Counseled on importance of regular laboratory monitoring as prescribed; Reviewed role and benefits of statin for ASCVD risk reduction; Reviewed importance of limiting foods high in cholesterol; Screening for signs and symptoms of depression related to chronic disease state;  Assessed social determinant of health barriers;  Encouraged exercise and getting outdoors daily  Patient Goals/Self-Care Activities: Patient will self administer medications as prescribed as evidenced by self report/primary caregiver report  Patient will attend  all scheduled provider appointments as evidenced by clinician review of documented attendance to scheduled appointments and patient/caregiver report Patient will continue to perform ADL's independently as evidenced by patient/caregiver report Patient will call provider office for new concerns or questions as evidenced by review of documented incoming telephone call notes and patient report Obtain blood  pressure cuff and start checking blood pressure at least 3 x per week Practice good handwashing and wear a mask as needed Follow a low sodium diet- read food labels and avoid salty snacks Look over education sent via My Chart- low sodium diet Bake or broil foods instead of frying Avoid trans/ saturated fats in your diet Look over advanced directives packet mailed to you Call RN care manager if any questions at [phone]   Follow Up Plan:  Telephone follow up appointment with care management team member scheduled for:  04/09/2021       Plan:Telephone follow up appointment with care management team member scheduled for:  04/09/2021  Jacqlyn Larsen [facility], BSN RN Case Manager [place] [phone]

## 2021-02-12 DIAGNOSIS — I10 Essential (primary) hypertension: Secondary | ICD-10-CM

## 2021-02-12 DIAGNOSIS — E785 Hyperlipidemia, unspecified: Secondary | ICD-10-CM

## 2021-02-24 ENCOUNTER — Other Ambulatory Visit: Payer: Self-pay | Admitting: Family Medicine

## 2021-02-25 DIAGNOSIS — Z0100 Encounter for examination of eyes and vision without abnormal findings: Secondary | ICD-10-CM | POA: Diagnosis not present

## 2021-03-03 ENCOUNTER — Other Ambulatory Visit: Payer: Self-pay | Admitting: Family Medicine

## 2021-04-03 DIAGNOSIS — M19071 Primary osteoarthritis, right ankle and foot: Secondary | ICD-10-CM | POA: Diagnosis not present

## 2021-04-03 DIAGNOSIS — M19072 Primary osteoarthritis, left ankle and foot: Secondary | ICD-10-CM | POA: Diagnosis not present

## 2021-04-03 DIAGNOSIS — I739 Peripheral vascular disease, unspecified: Secondary | ICD-10-CM | POA: Diagnosis not present

## 2021-04-03 DIAGNOSIS — B351 Tinea unguium: Secondary | ICD-10-CM | POA: Diagnosis not present

## 2021-04-03 DIAGNOSIS — I70203 Unspecified atherosclerosis of native arteries of extremities, bilateral legs: Secondary | ICD-10-CM | POA: Diagnosis not present

## 2021-04-09 ENCOUNTER — Ambulatory Visit (INDEPENDENT_AMBULATORY_CARE_PROVIDER_SITE_OTHER): Payer: Medicare HMO | Admitting: *Deleted

## 2021-04-09 DIAGNOSIS — I10 Essential (primary) hypertension: Secondary | ICD-10-CM

## 2021-04-09 DIAGNOSIS — E7849 Other hyperlipidemia: Secondary | ICD-10-CM

## 2021-04-09 NOTE — Chronic Care Management (AMB) (Signed)
Chronic Care Management   CCM RN Visit Note  04/09/2021 Name: [name] MRN: [id number] DOB: [birth year]  Subjective: [name] is a 88 y.o. year old male who is a primary care patient of Luking, Elayne Snare, MD. The care management team was consulted for assistance with disease management and care coordination needs.    Engaged with patient by telephone for follow up visit in response to provider referral for case management and/or care coordination services.   Consent to Services:  The patient was given information about Chronic Care Management services, agreed to services, and gave verbal consent prior to initiation of services.  Please see initial visit note for detailed documentation.   Patient agreed to services and verbal consent obtained.   Assessment: Review of patient past medical history, allergies, medications, health status, including review of consultants reports, laboratory and other test data, was performed as part of comprehensive evaluation and provision of chronic care management services.   SDOH (Social Determinants of Health) assessments and interventions performed:    CCM Care Plan  Allergies  Allergen Reactions   Penicillins Rash    Outpatient Encounter Medications as of 04/09/2021  Medication Sig   acetaminophen (TYLENOL) 650 MG CR tablet Take 650-1,300 mg by mouth every 8 (eight) hours as needed for pain.   clonazePAM (KLONOPIN) 0.5 MG tablet 1 qhs prn if agitated at bedtime   famotidine (PEPCID) 20 MG tablet TAKE (1) TABLET BY MOUTH TWICE DAILY.   mupirocin ointment (BACTROBAN) 2 % APPLY TO AFFECTED AREA TWICE DAILY   potassium chloride (KLOR-CON M) 10 MEQ tablet TAKE 1-2 TABLETS BY MOUTH TWICE DAILY DEPENDING ON TORSEMIDEDOSE.   rosuvastatin (CRESTOR) 20 MG tablet TAKE ONE TABLET BY MOUTH ONCE DAILY.   torsemide (DEMADEX) 20 MG tablet Take one to two po each morning prn   VENTOLIN HFA 108 (90 Base) MCG/ACT inhaler INHALE 2 PUFFS EVERY 4  HOURS AS NEEDED.   No facility-administered encounter medications on file as of 04/09/2021.    Patient Active Problem List   Diagnosis Date Noted   Pedal edema 02/12/2020   DOE (dyspnea on exertion) 02/12/2020   Peripheral edema 01/29/2020   S/P total knee replacement, left 06/09/10 01/03/2018   Chest pain 09/24/2016   AAA (abdominal aortic aneurysm) 09/24/2016   Cognitive dysfunction 12/27/2014   Hyperlipidemia 12/27/2014   Gout 01/11/2014   HTN (hypertension) 01/11/2014   Anemia, iron deficiency 04/03/2013   S/P total knee replacement, right 09/21/09 09/16/2010   GASTROESOPHAGEAL REFLUX DISEASE 05/08/2010   DIVERTICULOSIS, COLON 05/08/2010   TOBACCO ABUSE 05/08/2010   KNEE, ARTHRITIS, DEGEN./OSTEO 04/05/2008    Conditions to be addressed/monitored:HTN and HLD  Care Plan : RN Care Manager plan of care  Updates made by Kassie Mends, RN since 04/09/2021 12:00 AM     Problem: No plan of care established for management of chronic disease states (HTN, HLD)   Priority: High     Long-Range Goal: Development of plan of care for chronic disease management (HTN, HLD)   Start Date: 01/29/2021  Expected End Date: 07/28/2021  Priority: High  Note:   Current Barriers:  Knowledge Deficits related to plan of care for management of HTN  Spoke with patient's daughter Ava, per Ava pt lives with spouse and Ava lives 5 minutes away and checks in on her parents.  Patient is independent with most all aspects of his care and continues to drive.  Patient does not have a blood pressure cuff and does not monitor  blood pressure (daughter states they can get a cuff) and has not obtained cuff yet.  Patient did receive flu vaccine.  Patient does not have Advanced Directives- daughter requests packet be re-mailed. No new issues or concerns verbalized today  RNCM Clinical Goal(s):  Patient will verbalize understanding of plan for management of HTN and HLD as evidenced by patient report, review  EHR attend all scheduled medical appointments: 01/13/22 annual wellness visit as evidenced by patient report, review EHR and        through collaboration with Consulting civil engineer, provider, and care team.   Interventions: 1:1 collaboration with primary care provider regarding development and update of comprehensive plan of care as evidenced by provider attestation and co-signature Inter-disciplinary care team collaboration (see longitudinal plan of care) Evaluation of current treatment plan related to  self management and patient's adherence to plan as established by provider  Hypertension Interventions: Last practice recorded BP readings:  BP Readings from Last 3 Encounters:  12/03/20 (!) 148/86  11/05/20 125/78  07/23/20 134/86  Most recent eGFR/CrCl:  Lab Results  Component Value Date   EGFR 46 (L) 11/05/2020    No components found for: CRCL  Evaluation of current treatment plan related to hypertension self management and patient's adherence to plan as established by provider Reviewed medications with patient and discussed importance of compliance Advised patient, providing education and rationale, to monitor blood pressure daily and record, calling PCP for findings outside established parameters Reviewed scheduled/upcoming provider appointments including:  Discussed complications of poorly controlled blood pressure such as heart disease, stroke, circulatory complications, vision complications, kidney impairment, sexual dysfunction annual wellness visit 01/13/2022 Reinforced low sodium diet, importance of reading food labels Re-mailed advanced directives packet to patient's home  Reviewed importance of good handwashing and wearing a mask as needed  Hyperlipidemia:  (Status: New goal. Goal on Track (progressing): YES.) Long Term Goal  Lab Results  Component Value Date   CHOL 120 04/17/2020   HDL 76 04/17/2020   LDLCALC 29 04/17/2020   TRIG 73 04/17/2020   CHOLHDL 1.6 04/17/2020     Medication review performed; medication list updated in electronic medical record.  Reviewed importance of limiting foods high in cholesterol; Reviewed importance of staying mobile and exercising Encouraged exercise and getting outdoors daily Reviewed importance of following safety precautions  Patient Goals/Self-Care Activities: Patient will self administer medications as prescribed as evidenced by self report/primary caregiver report  Patient will attend all scheduled provider appointments as evidenced by clinician review of documented attendance to scheduled appointments and patient/caregiver report Patient will continue to perform ADL's independently as evidenced by patient/caregiver report Patient will call provider office for new concerns or questions as evidenced by review of documented incoming telephone call notes and patient report Obtain blood pressure cuff and start checking blood pressure at least 3 x per week Practice good handwashing and wear a mask as needed, avoid sick people Follow a low sodium diet- read food labels and avoid salty snacks Check labels for sodium content Bake or broil foods instead of frying Avoid trans/ saturated fats in your diet Advanced directives packet re-mailed Call RN care manager if any questions at [phone]  Follow Up Plan:  Telephone follow up appointment with care management team member scheduled for:  06/25/2021       Plan:Telephone follow up appointment with care management team member scheduled for:  06/25/2021  Jacqlyn Larsen [facility], BSN RN Case Manager [place] [phone]

## 2021-04-09 NOTE — Patient Instructions (Signed)
Visit Information  Thank you for taking time to visit with me today. Please don't hesitate to contact me if I can be of assistance to you before our next scheduled telephone appointment.  Following are the goals we discussed today:  Patient will self administer medications as prescribed as evidenced by self report/primary caregiver report  Patient will attend all scheduled provider appointments as evidenced by clinician review of documented attendance to scheduled appointments and patient/caregiver report Patient will continue to perform ADL's independently as evidenced by patient/caregiver report Patient will call provider office for new concerns or questions as evidenced by review of documented incoming telephone call notes and patient report Obtain blood pressure cuff and start checking blood pressure at least 3 x per week Practice good handwashing and wear a mask as needed, avoid sick people Follow a low sodium diet- read food labels and avoid salty snacks Check labels for sodium content Bake or broil foods instead of frying Avoid trans/ saturated fats in your diet Advanced directives packet re-mailed Call RN care manager if any questions at [phone]  Our next appointment is by telephone on 06/25/21 at 9 am  Please call the care guide team at [phone] if you need to cancel or reschedule your appointment.   If you are experiencing a Mental Health or [place] or need someone to talk to, please call the [place] National Suicide Prevention Lifeline: 470-471-1043 or TTY: (720)279-2246 TTY [phone]) to talk to a trained counselor call 1-800-273-TALK (toll free, 24 hour hotline) go to [facility] [address] [place] [phone]) call 911   Patient verbalizes understanding of instructions and care plan provided today and agrees to view in [place]. Active MyChart status confirmed with patient.    Jacqlyn Larsen [facility], BSN RN  Case Manager [facility] [phone]

## 2021-04-15 DIAGNOSIS — E7849 Other hyperlipidemia: Secondary | ICD-10-CM | POA: Diagnosis not present

## 2021-04-15 DIAGNOSIS — I10 Essential (primary) hypertension: Secondary | ICD-10-CM | POA: Diagnosis not present

## 2021-05-03 ENCOUNTER — Other Ambulatory Visit: Payer: Self-pay | Admitting: Family Medicine

## 2021-05-08 ENCOUNTER — Ambulatory Visit: Payer: Medicare HMO | Admitting: Family Medicine

## 2021-05-30 ENCOUNTER — Other Ambulatory Visit: Payer: Self-pay | Admitting: Family Medicine

## 2021-06-16 ENCOUNTER — Other Ambulatory Visit: Payer: Self-pay | Admitting: Family Medicine

## 2021-06-25 ENCOUNTER — Telehealth: Payer: Medicare HMO

## 2021-06-25 ENCOUNTER — Telehealth: Payer: Self-pay | Admitting: *Deleted

## 2021-06-25 NOTE — Telephone Encounter (Signed)
?  Care Management  ? ?Follow Up Note ? ? ?06/25/2021 ?Name: [name] MRN: [id number] DOB: 07-17-1925 ? ? ?Referred by: [name], MD ?Reason for referral : Chronic Care Management (HTN, HLD) ? ? ?An unsuccessful telephone outreach was attempted today. The patient was referred to the case management team for assistance with care management and care coordination.  ? ?Follow Up Plan: Telephone follow up appointment with care management team member scheduled for: upon care guide rescheduling. ? ?Jacqlyn Larsen RNC, BSN ?RN Case Manager ?[place] ?[phone] ? ? ?

## 2021-07-07 DIAGNOSIS — L84 Corns and callosities: Secondary | ICD-10-CM | POA: Diagnosis not present

## 2021-07-07 DIAGNOSIS — I739 Peripheral vascular disease, unspecified: Secondary | ICD-10-CM | POA: Diagnosis not present

## 2021-07-07 DIAGNOSIS — B351 Tinea unguium: Secondary | ICD-10-CM | POA: Diagnosis not present

## 2021-07-07 DIAGNOSIS — I70203 Unspecified atherosclerosis of native arteries of extremities, bilateral legs: Secondary | ICD-10-CM | POA: Diagnosis not present

## 2021-07-07 DIAGNOSIS — M19071 Primary osteoarthritis, right ankle and foot: Secondary | ICD-10-CM | POA: Diagnosis not present

## 2021-07-07 DIAGNOSIS — M19072 Primary osteoarthritis, left ankle and foot: Secondary | ICD-10-CM | POA: Diagnosis not present

## 2021-07-15 ENCOUNTER — Ambulatory Visit (INDEPENDENT_AMBULATORY_CARE_PROVIDER_SITE_OTHER): Payer: Medicare HMO | Admitting: Family Medicine

## 2021-07-15 VITALS — BP 110/70 | HR 68 | Temp 98.1°F | Ht 70.0 in | Wt 170.0 lb

## 2021-07-15 DIAGNOSIS — R600 Localized edema: Secondary | ICD-10-CM

## 2021-07-15 DIAGNOSIS — I951 Orthostatic hypotension: Secondary | ICD-10-CM

## 2021-07-15 DIAGNOSIS — R5383 Other fatigue: Secondary | ICD-10-CM | POA: Diagnosis not present

## 2021-07-15 MED ORDER — FAMOTIDINE 20 MG PO TABS: ORAL_TABLET | ORAL | 6 refills | Status: DC

## 2021-07-15 NOTE — Patient Instructions (Addendum)
Stop torsemide ? ?Stop potassium ? ?Come back in 2 weeks ? ?Do your bloodwork today ? ?Bring all your meds with you on your next visit ?

## 2021-07-15 NOTE — Progress Notes (Signed)
? ?  Subjective:  ? ? Patient ID: [name], male    DOB: [birth year], 88 y.o.   MRN: [id number] ? ?HPI ? ?Dizzyness going on x 3 days  ?Patient finds himself feeling intermittently dizzy difficult time walking finds himself losing ? ?Left lower leg swelling this is chronic ? ?Review of Systems ? ?   ?Objective:  ? Physical Exam ?General-in no acute distress ?Eyes-no discharge ?Lungs-respiratory rate normal, CTA ?CV-no murmurs,RRR ?Extremities skin warm dry no edema ?Neuro grossly normal ?Behavior normal, alert ? ? ?No sign of stroke on today's exam but blood pressure on the low end and drops when he stands ?There is no significant leg edema.  He does have some adipose tissue within the legs but I do not find any pitting edema ?   ?Assessment & Plan:  ?Significant dizziness with orthostasis more than likely related to his diuretic stop torsemide stop potassium. ?To do lab work ?Follow-up within a few weeks to recheck ?No sign of stroke on today's exam ?We will check lab work as well ?Patient will come back by with his medicines to verify this week ? ?

## 2021-07-16 ENCOUNTER — Telehealth: Payer: Medicare HMO

## 2021-07-16 DIAGNOSIS — R600 Localized edema: Secondary | ICD-10-CM | POA: Diagnosis not present

## 2021-07-16 DIAGNOSIS — I951 Orthostatic hypotension: Secondary | ICD-10-CM | POA: Diagnosis not present

## 2021-07-16 DIAGNOSIS — R5383 Other fatigue: Secondary | ICD-10-CM | POA: Diagnosis not present

## 2021-07-17 LAB — BASIC METABOLIC PANEL
BUN/Creatinine Ratio: 9 — ABNORMAL LOW (ref 10–24)
BUN: 14 mg/dL (ref 10–36)
CO2: 25 mmol/L (ref 20–29)
Calcium: 9.3 mg/dL (ref 8.6–10.2)
Chloride: 101 mmol/L (ref 96–106)
Creatinine, Ser: 1.5 mg/dL — ABNORMAL HIGH (ref 0.76–1.27)
Glucose: 85 mg/dL (ref 70–99)
Potassium: 3.2 mmol/L — ABNORMAL LOW (ref 3.5–5.2)
Sodium: 143 mmol/L (ref 134–144)
eGFR: 43 mL/min/{1.73_m2} — ABNORMAL LOW (ref >59)

## 2021-07-17 LAB — CBC WITH DIFFERENTIAL/PLATELET
Basophils Absolute: 0 10*3/uL (ref 0.0–0.2)
Basos: 1 %
EOS (ABSOLUTE): 0.2 10*3/uL (ref 0.0–0.4)
Eos: 4 %
Hematocrit: 36.7 % — ABNORMAL LOW (ref 37.5–51.0)
Hemoglobin: 12 g/dL — ABNORMAL LOW (ref 13.0–17.7)
Immature Grans (Abs): 0 10*3/uL (ref 0.0–0.1)
Immature Granulocytes: 0 %
Lymphocytes Absolute: 0.9 10*3/uL (ref 0.7–3.1)
Lymphs: 24 %
MCH: 31.5 pg (ref 26.6–33.0)
MCHC: 32.7 g/dL (ref 31.5–35.7)
MCV: 96 fL (ref 79–97)
Monocytes Absolute: 0.4 10*3/uL (ref 0.1–0.9)
Monocytes: 11 %
Neutrophils Absolute: 2.3 10*3/uL (ref 1.4–7.0)
Neutrophils: 60 %
Platelets: 116 10*3/uL — ABNORMAL LOW (ref 150–450)
RBC: 3.81 x10E6/uL — ABNORMAL LOW (ref 4.14–5.80)
RDW: 11 % — ABNORMAL LOW (ref 11.6–15.4)
WBC: 3.9 10*3/uL (ref 3.4–10.8)

## 2021-07-22 ENCOUNTER — Ambulatory Visit (INDEPENDENT_AMBULATORY_CARE_PROVIDER_SITE_OTHER): Payer: Medicare HMO | Admitting: Family Medicine

## 2021-07-22 VITALS — BP 138/86 | HR 68 | Temp 98.1°F | Ht 70.0 in | Wt 178.0 lb

## 2021-07-22 DIAGNOSIS — R5383 Other fatigue: Secondary | ICD-10-CM | POA: Diagnosis not present

## 2021-07-22 DIAGNOSIS — R600 Localized edema: Secondary | ICD-10-CM | POA: Diagnosis not present

## 2021-07-22 NOTE — Patient Instructions (Addendum)
Take 27mq potassium one a day for the next 10 days ?Then recheck your blood test ? ?Recheck here in 2 months ?BP today 138 /86 which is good ?

## 2021-07-22 NOTE — Progress Notes (Signed)
? ?  Subjective:  ? ? Patient ID: [name], male    DOB: 1925/11/13, 88 y.o.   MRN: [id number] ? ?HPI ? ?Patient here for 1 week follow up ?Patient here for follow-up ?His potassium was low ?We had him stop the diuretic and potassium on last visit because of orthostasis ?He denies any setbacks or problems otherwise ? ?Review of Systems ? ?   ?Objective:  ? Physical Exam ?Has some atrophy of the thenar muscles along with weakness of grip which is consistent with carpal tunnel versus myelopathy but patient feels like it is currently not bothering him at his age we will leave it be ? ?Lungs clear heart regular pulse normal BP good ?BP good with standing ? ? ? ?   ?Assessment & Plan:  ?Hypotension has resolved ?Minimal pedal edema ?Hypokalemia recommend potassium for the next 10 days recheck potassium level within 2 weeks follow-up office visit within 2 months ? ?

## 2021-07-23 ENCOUNTER — Telehealth: Payer: Medicare HMO

## 2021-07-23 ENCOUNTER — Telehealth: Payer: Self-pay | Admitting: *Deleted

## 2021-07-23 NOTE — Telephone Encounter (Signed)
?  Care Management  ? ?Follow Up Note ? ? ?07/23/2021 ?Name: [name] MRN: [id number] DOB: [birth year] ? ? ?Referred by: [name], MD ?Reason for referral : Chronic Care Management (HTN, HLD) ? ? ?A second unsuccessful telephone outreach was attempted today. The patient was referred to the case management team for assistance with care management and care coordination.  ? ?Follow Up Plan: Telephone follow up appointment with care management team member scheduled for: upon care guide rescheduling. ? ?Jacqlyn Larsen RNC, BSN ?RN Case Manager ?[place] ?[phone] ? ? ?

## 2021-08-19 ENCOUNTER — Encounter: Payer: Self-pay | Admitting: Family Medicine

## 2021-08-19 ENCOUNTER — Ambulatory Visit (INDEPENDENT_AMBULATORY_CARE_PROVIDER_SITE_OTHER): Payer: Medicare HMO | Admitting: Family Medicine

## 2021-08-19 VITALS — BP 130/70 | HR 57 | Temp 98.6°F | Wt 176.4 lb

## 2021-08-19 DIAGNOSIS — R5383 Other fatigue: Secondary | ICD-10-CM | POA: Diagnosis not present

## 2021-08-19 DIAGNOSIS — N1832 Chronic kidney disease, stage 3b: Secondary | ICD-10-CM | POA: Diagnosis not present

## 2021-08-19 DIAGNOSIS — R600 Localized edema: Secondary | ICD-10-CM | POA: Diagnosis not present

## 2021-08-19 NOTE — Patient Instructions (Signed)
Please do your blood work as we discussed  Also wear the knee-high surgical support stockings to help you with swelling in your legs if any ongoing troubles please follow-up sooner we will see you in the fall time [place]

## 2021-08-19 NOTE — Progress Notes (Signed)
   Subjective:    Patient ID: [name], male    DOB: [birth year], 88 y.o.   MRN: [id number]  HPI Pt presents with bilateral feet swelling. Pt states it began about 2 weeks ago. Pt states his feet feel funny. Pt reports he was on fluid pill but they were discontinued.  Has pedal edema worse on some days Denies chest pain no doe Recent labs with CKD  Review of Systems     Objective:   Physical Exam  General-in no acute distress Eyes-no discharge Lungs-respiratory rate normal, CTA CV-no murmurs,RRR Extremities skin warm dry no edema Neuro grossly normal Behavior normal, alert       Assessment & Plan:   Pedal edema in the legs is not severe recommend knee-high surgical support hose stay away from diuretics  Follow-up 3 to 4 months  Chronic kidney disease stable

## 2021-08-20 LAB — BASIC METABOLIC PANEL
BUN/Creatinine Ratio: 9 — ABNORMAL LOW (ref 10–24)
BUN: 11 mg/dL (ref 10–36)
CO2: 22 mmol/L (ref 20–29)
Calcium: 9.5 mg/dL (ref 8.6–10.2)
Chloride: 106 mmol/L (ref 96–106)
Creatinine, Ser: 1.23 mg/dL (ref 0.76–1.27)
Glucose: 88 mg/dL (ref 70–99)
Potassium: 4.4 mmol/L (ref 3.5–5.2)
Sodium: 144 mmol/L (ref 134–144)
eGFR: 55 mL/min/{1.73_m2} — ABNORMAL LOW (ref >59)

## 2021-08-21 ENCOUNTER — Other Ambulatory Visit: Payer: Self-pay | Admitting: Family Medicine

## 2021-08-27 ENCOUNTER — Telehealth: Payer: Self-pay

## 2021-08-27 ENCOUNTER — Telehealth: Payer: Self-pay | Admitting: *Deleted

## 2021-08-27 ENCOUNTER — Telehealth: Payer: Self-pay | Admitting: Family Medicine

## 2021-08-27 NOTE — Telephone Encounter (Signed)
Patient says his legs are still swelling, having a hard time walking, elevating his feet and wearing his compression socks. Please advise. Thank you. Last seen on 08/19/21 for pedal edema

## 2021-08-27 NOTE — Telephone Encounter (Signed)
Patient called stating legs are still swelling and wearing his compression ,please advise

## 2021-08-27 NOTE — Telephone Encounter (Signed)
Please see Dr. Jonathon Jordan response. Patient informed of md recommendations. Verbalized understanding. Appointment made for 09/01/21 at 9:20 a.

## 2021-08-27 NOTE — Telephone Encounter (Signed)
  Care Management   Follow Up Note   08/27/2021 Name: [name] MRN: [id number] DOB: 10-Jan-1926   Referred by: [name], MD Reason for referral : Chronic Care Management (HTN, HLD)   Spoke with patient's daughter [name] for third telephone outreach attempt, [name] reports she is not at home and cannot talk right now, requests that RN care manager call patient directly, outreach call to patient with no answer to telephone and no option to leave voicemail.  Follow Up Plan: Telephone follow up appointment with care management team member scheduled for: upon care guide rescheduling.  Jacqlyn Larsen [facility], BSN RN Case Manager [facility] [phone]

## 2021-09-01 ENCOUNTER — Ambulatory Visit: Payer: Medicare HMO | Admitting: Family Medicine

## 2021-09-03 ENCOUNTER — Telehealth: Payer: Medicare HMO

## 2021-09-11 ENCOUNTER — Telehealth: Payer: Self-pay | Admitting: *Deleted

## 2021-09-11 MED ORDER — FAMOTIDINE 20 MG PO TABS
ORAL_TABLET | ORAL | 1 refills | Status: DC
Start: 2021-09-11 — End: 2021-12-04

## 2021-09-11 NOTE — Telephone Encounter (Signed)
Patient's wife informed of md message and recommendations. Verbalized understanding.  Prescription sent in.

## 2021-09-11 NOTE — Telephone Encounter (Signed)
More than likely they will not refill the medicine until the insurance allows it to be refilled so therefore I recommend I will change the directions on it and it should be able to get it refilled  Famotidine 20 mg 1 twice daily #60 with 1 refill Once he gets it refilled I would recommend we go back to just once per day when he follows up later in July thank you

## 2021-09-11 NOTE — Telephone Encounter (Signed)
Patient is out of his Famotidine and the pharmacy will not him refill is until 09/13/21.  Patient has been taking the medication twice a day instead of once a day.  Please advise. Thank you

## 2021-09-17 ENCOUNTER — Ambulatory Visit (INDEPENDENT_AMBULATORY_CARE_PROVIDER_SITE_OTHER): Payer: Medicare HMO | Admitting: *Deleted

## 2021-09-17 NOTE — Chronic Care Management (AMB) (Signed)
   09/17/2021  ARMANDO BUKHARI 08/21/1925 [id number]    Care Management   Follow Up Note   09/17/2021 Name: [name] MRN: [id number] DOB: 1925/08/07   Referred by: [name], MD Reason for referral : Chronic Care Management (HTN,HLD)   Third unsuccessful telephone outreach was attempted today. The patient was referred to the case management team for assistance with care management and care coordination. The patient's primary care provider has been notified of our unsuccessful attempts to make or maintain contact with the patient. The care management team is pleased to engage with this patient at any time in the future should he/she be interested in assistance from the care management team.   Follow Up Plan: No further follow up required: unable to maintain contact, case closed.  Jacqlyn Larsen [facility], BSN RN Case Manager [facility] [phone]

## 2021-09-18 ENCOUNTER — Encounter: Payer: Self-pay | Admitting: Nurse Practitioner

## 2021-09-18 ENCOUNTER — Emergency Department (HOSPITAL_COMMUNITY): Payer: Medicare HMO

## 2021-09-18 ENCOUNTER — Ambulatory Visit (INDEPENDENT_AMBULATORY_CARE_PROVIDER_SITE_OTHER): Payer: Medicare HMO | Admitting: Nurse Practitioner

## 2021-09-18 ENCOUNTER — Other Ambulatory Visit: Payer: Self-pay

## 2021-09-18 ENCOUNTER — Other Ambulatory Visit (HOSPITAL_COMMUNITY)
Admission: RE | Admit: 2021-09-18 | Discharge: 2021-09-18 | Disposition: A | Discharge status: Home / Self Care | Payer: Medicare HMO | Source: Ambulatory Visit | Attending: Nurse Practitioner | Admitting: Nurse Practitioner

## 2021-09-18 ENCOUNTER — Emergency Department (HOSPITAL_COMMUNITY)
Admission: EM | Admit: 2021-09-18 | Discharge: 2021-09-18 | Disposition: A | Discharge status: Home / Self Care | Payer: Medicare HMO | Attending: Emergency Medicine | Admitting: Emergency Medicine

## 2021-09-18 ENCOUNTER — Ambulatory Visit (HOSPITAL_COMMUNITY)
Admission: RE | Admit: 2021-09-18 | Discharge: 2021-09-18 | Disposition: A | Discharge status: Home / Self Care | Payer: Medicare HMO | Source: Ambulatory Visit | Attending: Nurse Practitioner | Admitting: Nurse Practitioner

## 2021-09-18 ENCOUNTER — Encounter (HOSPITAL_COMMUNITY): Payer: Self-pay

## 2021-09-18 VITALS — BP 151/81 | HR 61 | Temp 98.6°F | Ht 70.0 in | Wt 172.0 lb

## 2021-09-18 DIAGNOSIS — R0602 Shortness of breath: Secondary | ICD-10-CM | POA: Insufficient documentation

## 2021-09-18 DIAGNOSIS — M7989 Other specified soft tissue disorders: Secondary | ICD-10-CM

## 2021-09-18 DIAGNOSIS — R42 Dizziness and giddiness: Secondary | ICD-10-CM

## 2021-09-18 DIAGNOSIS — R2243 Localized swelling, mass and lump, lower limb, bilateral: Secondary | ICD-10-CM

## 2021-09-18 DIAGNOSIS — R079 Chest pain, unspecified: Secondary | ICD-10-CM | POA: Diagnosis not present

## 2021-09-18 DIAGNOSIS — R059 Cough, unspecified: Secondary | ICD-10-CM | POA: Diagnosis not present

## 2021-09-18 DIAGNOSIS — R600 Localized edema: Secondary | ICD-10-CM

## 2021-09-18 DIAGNOSIS — R0789 Other chest pain: Secondary | ICD-10-CM | POA: Diagnosis not present

## 2021-09-18 LAB — CBC WITH DIFFERENTIAL/PLATELET
Abs Immature Granulocytes: 0 10*3/uL (ref 0.00–0.07)
Basophils Absolute: 0.1 10*3/uL (ref 0.0–0.1)
Basophils Relative: 1 %
Eosinophils Absolute: 0.3 10*3/uL (ref 0.0–0.5)
Eosinophils Relative: 5 %
HCT: 40.4 % (ref 39.0–52.0)
Hemoglobin: 12.7 g/dL — ABNORMAL LOW (ref 13.0–17.0)
Immature Granulocytes: 0 %
Lymphocytes Relative: 21 %
Lymphs Abs: 1.2 10*3/uL (ref 0.7–4.0)
MCH: 31.7 pg (ref 26.0–34.0)
MCHC: 31.4 g/dL (ref 30.0–36.0)
MCV: 100.7 fL — ABNORMAL HIGH (ref 80.0–100.0)
Monocytes Absolute: 0.7 10*3/uL (ref 0.1–1.0)
Monocytes Relative: 12 %
Neutro Abs: 3.4 10*3/uL (ref 1.7–7.7)
Neutrophils Relative %: 61 %
Platelets: 120 10*3/uL — ABNORMAL LOW (ref 150–400)
RBC: 4.01 MIL/uL — ABNORMAL LOW (ref 4.22–5.81)
RDW: 12.1 % (ref 11.5–15.5)
WBC: 5.5 10*3/uL (ref 4.0–10.5)
nRBC: 0 % (ref 0.0–0.2)

## 2021-09-18 LAB — COMPREHENSIVE METABOLIC PANEL
ALT: 13 U/L (ref 0–44)
AST: 22 U/L (ref 15–41)
Albumin: 4.4 g/dL (ref 3.5–5.0)
Alkaline Phosphatase: 60 U/L (ref 38–126)
Anion gap: 8 (ref 5–15)
BUN: 11 mg/dL (ref 8–23)
CO2: 29 mmol/L (ref 22–32)
Calcium: 9.5 mg/dL (ref 8.9–10.3)
Chloride: 107 mmol/L (ref 98–111)
Creatinine, Ser: 1.44 mg/dL — ABNORMAL HIGH (ref 0.61–1.24)
GFR, Estimated: 45 mL/min — ABNORMAL LOW (ref >60)
Glucose, Bld: 98 mg/dL (ref 70–99)
Potassium: 4.3 mmol/L (ref 3.5–5.1)
Sodium: 144 mmol/L (ref 135–145)
Total Bilirubin: 2.6 mg/dL — ABNORMAL HIGH (ref 0.3–1.2)
Total Protein: 7.6 g/dL (ref 6.5–8.1)

## 2021-09-18 LAB — BRAIN NATRIURETIC PEPTIDE: B Natriuretic Peptide: 126 pg/mL — ABNORMAL HIGH (ref 0.0–100.0)

## 2021-09-18 LAB — D-DIMER, QUANTITATIVE: D-Dimer, Quant: 1.28 ug/mL-FEU — ABNORMAL HIGH (ref 0.00–0.50)

## 2021-09-18 MED ORDER — IOHEXOL 350 MG/ML SOLN
75.0000 mL | Freq: Once | INTRAVENOUS | Status: AC | PRN
Start: 2021-09-18 — End: 2021-09-18
  Administered 2021-09-18: 75 mL via INTRAVENOUS

## 2021-09-18 NOTE — Discharge Instructions (Signed)
Return, for ultrasound imaging of the legs to be evaluated for blood clots.  Follow the instructions on the paperwork to schedule the time to be seen for the ultrasound testing.  In the meantime elevate your legs above your heart is much as possible.

## 2021-09-18 NOTE — ED Provider Notes (Signed)
[facility] Provider Note   CSN: [id number] Arrival date & time: 09/18/21  1735     History  No chief complaint on file.   [name] is a 88 y.o. male.  HPI Patient saw his PCP today for leg swelling.  She ordered some labs, 1 of which was a D-dimer and it was positive so he was apparently sent here for that.  Patient has had some chest symptoms including shortness of breath and nonproductive cough for several days.  He reported some chest pain to his wife but currently denies chest pain to me.  He apparently does not have a pre-existing cardiac disorder.  At one point he was told he had a cardiac murmur but later he did not.    Home Medications Prior to Admission medications   Medication Sig Start Date End Date Taking? Authorizing Provider  acetaminophen (TYLENOL) 650 MG CR tablet Take 650-1,300 mg by mouth every 8 (eight) hours as needed for pain.   Yes [provider]  famotidine (PEPCID) 20 MG tablet Take 1 tablet by mouth Twice a day 09/11/21  Yes Luking, Elayne Snare, MD  rosuvastatin (CRESTOR) 20 MG tablet TAKE ONE TABLET BY MOUTH ONCE DAILY. 08/21/21  Yes Luking, Elayne Snare, MD  VENTOLIN HFA 108 (90 Base) MCG/ACT inhaler INHALE 2 PUFFS EVERY 4 HOURS AS NEEDED. 08/19/20  Yes [name], MD      Allergies    Penicillins    Review of Systems   Review of Systems  Physical Exam Updated Vital Signs BP (!) 162/80   Pulse 62   Temp 98 F (36.7 C) (Oral)   Resp 18   Ht '5\' 10"'$  (1.778 m)   Wt 78 kg   SpO2 97%   BMI 24.68 kg/m  Physical Exam Vitals and nursing note reviewed.  Constitutional:      Appearance: He is well-developed and normal weight. He is not ill-appearing.     Comments: He is frail  HENT:     Head: Normocephalic and atraumatic.     Right Ear: External ear normal.     Left Ear: External ear normal.  Eyes:     Conjunctiva/sclera: Conjunctivae normal.     Pupils: Pupils are equal, round, and reactive to light.  Neck:      Trachea: Phonation normal.  Cardiovascular:     Rate and Rhythm: Normal rate and regular rhythm.     Heart sounds: Normal heart sounds.  Pulmonary:     Effort: Pulmonary effort is normal. No respiratory distress.     Breath sounds: Normal breath sounds. No stridor. No wheezing or rhonchi.  Abdominal:     Palpations: Abdomen is soft.     Tenderness: There is no abdominal tenderness.  Musculoskeletal:        General: Normal range of motion.     Cervical back: Normal range of motion and neck supple.     Right lower leg: Edema present.     Left lower leg: Edema present.     Comments: 4+ lower leg edema bilaterally, calves are nontender to palpation.  Skin:    General: Skin is warm and dry.  Neurological:     Mental Status: He is alert and oriented to person, place, and time.     Cranial Nerves: No cranial nerve deficit.     Sensory: No sensory deficit.     Motor: No abnormal muscle tone.     Coordination: Coordination normal.  Psychiatric:  Behavior: Behavior normal.        Thought Content: Thought content normal.        Judgment: Judgment normal.     ED Results / Procedures / Treatments   Labs (all labs ordered are listed, but only abnormal results are displayed) Labs Reviewed - No data to display  EKG EKG Interpretation  Date/Time:  Thursday September 18 2021 17:55:11 EDT Ventricular Rate:  73 PR Interval:  188 QRS Duration: 74 QT Interval:  428 QTC Calculation: 471 R Axis:   23 Text Interpretation: Sinus rhythm with Premature atrial complexes Nonspecific T wave abnormality Prolonged QT Abnormal ECG When compared with ECG of 25-Sep-2016 05:53, Criteria for Septal infarct are no longer Present Non-specific change in ST segment in Anterolateral leads Inverted T waves have replaced nonspecific T wave abnormality in Lateral leads Confirmed by Daleen Bo [phone]) on 09/18/2021 6:14:16 PM  Radiology CT Angio Chest PE W/Cm &/Or Wo Cm  Result Date: 09/18/2021 CLINICAL  DATA:  Shortness of breath and dizziness with bilateral leg swelling, initial encounter EXAM: CT ANGIOGRAPHY CHEST WITH CONTRAST TECHNIQUE: Multidetector CT imaging of the chest was performed using the standard protocol during bolus administration of intravenous contrast. Multiplanar CT image reconstructions and MIPs were obtained to evaluate the vascular anatomy. RADIATION DOSE REDUCTION: This exam was performed according to the departmental dose-optimization program which includes automated exposure control, adjustment of the mA and/or kV according to patient size and/or use of iterative reconstruction technique. CONTRAST:  80m OMNIPAQUE IOHEXOL 350 MG/ML SOLN COMPARISON:  Chest x-ray from earlier in the same day. FINDINGS: Cardiovascular: Atherosclerotic calcifications of the thoracic aorta are noted without aneurysmal dilatation. No dissection is seen. Heart is at the upper limits of normal in size. Coronary calcifications are noted. No filling defect to suggest pulmonary embolism is noted. Pulmonary artery shows a normal branching pattern. Mediastinum/Nodes: Thoracic inlet is within normal limits. No hilar or mediastinal adenopathy is noted. The esophagus is within normal limits. Lungs/Pleura: Lungs are well aerated bilaterally. No focal infiltrate or sizable effusion is seen. No sizable nodule is noted. Upper Abdomen: Visualized upper abdomen shows no acute abnormality. Musculoskeletal: Degenerative changes of the thoracic spine are seen. No acute rib abnormality is noted. Review of the MIP images confirms the above findings. IMPRESSION: No evidence of pulmonary emboli. No acute abnormality seen. Electronically Signed   By: MInez CatalinaM.D.   On: 09/18/2021 21:10   DG Chest 2 View  Result Date: 09/18/2021 CLINICAL DATA:  Shortness of breath, leg swelling, dry cough EXAM: CHEST - 2 VIEW COMPARISON:  11/15/2019 FINDINGS: The heart size and mediastinal contours are within normal limits. Both lungs are clear.  Disc degenerative disease of the thoracic spine. IMPRESSION: No acute abnormality of the lungs. Electronically Signed   By: ADelanna AhmadiM.D.   On: 09/18/2021 14:32    Procedures Procedures    Medications Ordered in ED Medications  iohexol (OMNIPAQUE) 350 MG/ML injection 75 mL (75 mLs Intravenous Contrast Given 09/18/21 2055)    ED Course/ Medical Decision Making/ A&P                           Medical Decision Making Patient presented to the ER with reported leg swelling, had an outpatient evaluation today with concerning elevation of D-dimer so he was sent here for further evaluation.  He has vague chest pain and dyspnea.  He has chronic lower extremity edema.  He is aDealerwith a  cane.  There have been no traumatic injuries  Problems Addressed: Chest pain, unspecified type: self-limited or minor problem    Details: Noncardiac type of chest pain Localized swelling of both lower legs: chronic illness or injury with exacerbation, progression, or side effects of treatment    Details: Apparently more symptomatic recently  Amount and/or Complexity of Data Reviewed Independent Historian: caregiver    Details: He is a cogent historian, family members assessed at the bedside External Data Reviewed: labs and notes.    Details: Notes from encounter today reviewed, including abnormal laboratory results.  Patient was being evaluated for ongoing but worsening lower extremity edema.  GFR greater than 30 therefore will order CT angiogram. Radiology: ordered and independent interpretation performed.    Details: CT chest angiogram no PE  Risk Prescription drug management. Risk Details: The patient is being evaluated for lower extremity edema.  He has incidental mild elevation of D-dimer.  There is been no traumatic injuries.  He has mild chronic renal insufficiency.  His chest pain and shortness of breath are mild, ED evaluation did not indicate PE, pneumonia or heart failure.  Unable to obtain  venous Dopplers due to time of day.  Patient stable for discharge.  He does not require anticoagulation because of low level suspicion for DVT.  Hopefully he can have the Dopplers done next day to rule out DVT.  I recommend he follow-up with the PCP for ongoing management.  He does not require hospitalization.           Final Clinical Impression(s) / ED Diagnoses Final diagnoses:  Localized swelling of both lower legs  Chest pain, unspecified type    Rx / DC Orders ED Discharge Orders          Ordered    US Venous Img Lower Bilateral  Status:  Canceled        09/18/21 2325    US Venous Img Lower Bilateral (DVT)        09/18/21 2325              Daleen Bo, MD 09/19/21 1014

## 2021-09-18 NOTE — Progress Notes (Signed)
Subjective:    Patient ID: [name], male    DOB: [birth year], 88 y.o.   MRN: [id number]  HPI 88 year old male patient with history of hypertension asthma, cognitive dysfunction, stage III kidney disease, peripheral edema, and chronic venous stasis presents to the clinic today for dizziness since yesterday and bilateral leg swelling x1.5 weeks.  Patient states that he noticed that he was dizzy yesterday after standing up.  Patient states that he felt unstable when trying to walk and had to sit down.  Patient denies any headaches, changes to his vision, weakness, tingling, numbness, vertigo, slurred speech, facial drooping.  Patient also concerned about his bilateral legs.  Patient states that his PCP recommended compression stockings to manage bilateral leg swelling.  Patient states that he feels that the compression stockings makes his swelling worse.  Patient also admits to shortness of breath.  Patient states that he feels that he is more short of breath than normal.  Patient states that he is used to walking outside of his home but states lately he has not been able to do that due to being short of breath.  Patient denies any chest pain, chest pressure, or chest discomfort, cough, orthopnea, chest tightness, fever, body aches, chills.   Review of Systems  Respiratory:  Positive for shortness of breath.   Cardiovascular:  Positive for leg swelling.  Neurological:  Positive for dizziness.  All other systems reviewed and are negative.      Objective:   Physical Exam Vitals reviewed.  Constitutional:      General: He is not in acute distress.    Appearance: Normal appearance. He is normal weight. He is not ill-appearing, toxic-appearing or diaphoretic.  HENT:     Head: Normocephalic and atraumatic.  Cardiovascular:     Rate and Rhythm: Normal rate and regular rhythm.     Pulses: Normal pulses.     Heart sounds: Heart sounds are distant. No murmur heard.    Comments:  Heart sounds seem to be mildly distant.  However no murmur appreciated on exam Pulmonary:     Effort: Pulmonary effort is normal. No respiratory distress.     Breath sounds: No stridor. No wheezing, rhonchi or rales.     Comments: Breath sounds mildly diminished.  However no wheezing, rhonchi, stridor noted Chest:     Chest wall: No tenderness.  Musculoskeletal:        General: Swelling present.     Cervical back: Normal range of motion and neck supple. No rigidity or tenderness.     Right lower leg: Edema present.     Left lower leg: Edema present.     Comments: Grossly intact.  Ambulates with cane.  Strength to upper and lower extremity 5 out of 5.  Nonpitting edema noted to bilateral legs.  Bilateral legs nonpainful to palpation.  Negative Homans' sign  Lymphadenopathy:     Cervical: No cervical adenopathy.  Skin:    General: Skin is warm.     Capillary Refill: Capillary refill takes less than 2 seconds.  Neurological:     Mental Status: He is alert.     Cranial Nerves: Cranial nerves 2-12 are intact. No cranial nerve deficit or facial asymmetry.     Sensory: No sensory deficit.     Motor: Motor function is intact. No weakness, tremor, atrophy or seizure activity.     Gait: Gait is intact.     Comments: Grossly intact. Gait at baseline. Patient ambulates with cane.  Psychiatric:        Mood and Affect: Mood normal.        Behavior: Behavior normal.        Assessment & Plan:   1. Leg swelling -Likely secondary to chronic venous stasis - However will rule out congestive heart failure, electrolyte imbalance, worsening kidney function - EKG 12-Lead - Brain natriuretic peptide, stat - CBC with Differential/Platelet - COMPLETE METABOLIC PANEL WITH GFR -Follow-up with primary care provider within 1 to 2 weeks -If symptoms worsen over the weekend go to the emergency room  2. Dizziness -Suspect possible dehydration or electrolyte imbalance - Low suspicions for stroke due to  negative neuro exam.  - EKG 12-Lead - CBC with Differential/Platelet - COMPLETE METABOLIC PANEL WITH GFR - RTC if symptoms worsen or go the ED  3. SOB (shortness of breath) - Unsure of etiology. Ddx: PE, Pneumonia, CHF, asthma exacerbation.  - Continue to use albuterol as prescribed.  - EKG 12-Lead - DG Chest 2 View - Brain natriuretic peptide - D-dimer, quantitative - RTC or go to ED if symptoms worsen.     Note:  This document was prepared using Dragon voice recognition software and may include unintentional dictation errors. Note - This record has been created using [employer].  Chart creation errors have been sought, but may not always  have been located. Such creation errors do not reflect on  the standard of medical care.

## 2021-09-18 NOTE — ED Triage Notes (Addendum)
Pt saw PCP today for ShOB, dizzy, and bilateral leg swelling. PCP performed lab test and noted pt had an elevated d-dimer. Pt was advised to come to the ED. Pt's labs that were obtained today are available in the EHR.

## 2021-09-19 ENCOUNTER — Ambulatory Visit (HOSPITAL_COMMUNITY)
Admission: RE | Admit: 2021-09-19 | Discharge: 2021-09-19 | Disposition: A | Discharge status: Home / Self Care | Payer: Medicare HMO | Source: Ambulatory Visit | Attending: Emergency Medicine | Admitting: Emergency Medicine

## 2021-09-19 DIAGNOSIS — R600 Localized edema: Secondary | ICD-10-CM | POA: Diagnosis not present

## 2021-10-13 ENCOUNTER — Encounter: Payer: Self-pay | Admitting: Family Medicine

## 2021-10-13 ENCOUNTER — Ambulatory Visit (INDEPENDENT_AMBULATORY_CARE_PROVIDER_SITE_OTHER): Payer: Medicare HMO | Admitting: Family Medicine

## 2021-10-13 VITALS — BP 126/86 | HR 64 | Temp 98.4°F | Wt 172.2 lb

## 2021-10-13 DIAGNOSIS — I10 Essential (primary) hypertension: Secondary | ICD-10-CM | POA: Diagnosis not present

## 2021-10-13 DIAGNOSIS — M7989 Other specified soft tissue disorders: Secondary | ICD-10-CM

## 2021-10-13 DIAGNOSIS — E7849 Other hyperlipidemia: Secondary | ICD-10-CM | POA: Diagnosis not present

## 2021-10-13 NOTE — Progress Notes (Signed)
   Subjective:    Patient ID: [name], male    DOB: 21-Feb-1926, 88 y.o.   MRN: [id number]  HPI Pt arrives due to feet swelling. Pt states feet have been swelling for a while. Pt reports intermittent chest pain and son reports some shortness of breath (but may have been related to some one burning trash in the neighborhood).  Patient relates an occasional sharp chest pain on the left side but there is no substernal discomfort with activity denies shortness of breath unless he pushes himself too hard  Review of Systems     Objective:   Physical Exam   General-in no acute distress Eyes-no discharge Lungs-respiratory rate normal, CTA CV-no murmurs,RRR Extremities skin warm dry mild lower leg edema Neuro grossly normal Behavior normal, alert      Assessment & Plan:  Chronic pedal edema Recommend compression stocking Because of elevated creatinine and no sign of heart failure I do not recommend diuretics Healthy diet recommended Follow-up in 6 weeks Previous EKG reviewed

## 2021-10-27 ENCOUNTER — Telehealth: Payer: Self-pay | Admitting: Family Medicine

## 2021-10-27 NOTE — Telephone Encounter (Signed)
2 refills Recommend a follow-up office visit within the next 30 days to check this area if it is not getting better or if it is getting worse to make sure he does not have a pressure sore

## 2021-10-27 NOTE — Telephone Encounter (Signed)
Pt requesting refill on Mupirocin ointment. Pt having pain at buttock area. [place]. Please advise. Thank you

## 2021-10-28 MED ORDER — MUPIROCIN 2 % EX OINT: 1.0000 | TOPICAL_OINTMENT | Freq: Two times a day (BID) | CUTANEOUS | 2 refills | Status: DC

## 2021-10-28 NOTE — Telephone Encounter (Signed)
Tried calling to inform per medication refill sent to pharmacy, mailbox is full

## 2021-10-28 NOTE — Addendum Note (Signed)
Addended by: Anastasio Auerbach R on: 10/28/2021 03:54 PM   Modules accepted: Orders

## 2021-10-30 NOTE — Telephone Encounter (Signed)
Called to inform per drs recommendations, unable to leave a message, mailbox is full.

## 2021-11-04 NOTE — Telephone Encounter (Signed)
Pt contacted and verbalized understanding.  

## 2021-12-01 DIAGNOSIS — H903 Sensorineural hearing loss, bilateral: Secondary | ICD-10-CM | POA: Diagnosis not present

## 2021-12-01 DIAGNOSIS — H6121 Impacted cerumen, right ear: Secondary | ICD-10-CM | POA: Diagnosis not present

## 2021-12-02 IMAGING — CT CT ABD-PELV W/ CM
2 of 5 series · 16 of 46 positions shown, 18 images · IV contrast (Omnipaque or Isovue)
Comparison: 05/27/2016

CLINICAL DATA: Follow-up slight enlargement of the aorta.

EXAM:
CT ABDOMEN AND PELVIS WITH CONTRAST
TECHNIQUE: Multidetector CT imaging of the abdomen and pelvis was performed
using the standard protocol following bolus administration of
intravenous contrast.
CONTRAST:  100mL OMNIPAQUE IOHEXOL 300 MG/ML  SOLN

[Series 2: axial st · axial · 0.74mm/px · z∈[-284,+96]mm · 13 of 88 slices shown, 15 images]
[im 6/88  soft-tissue]
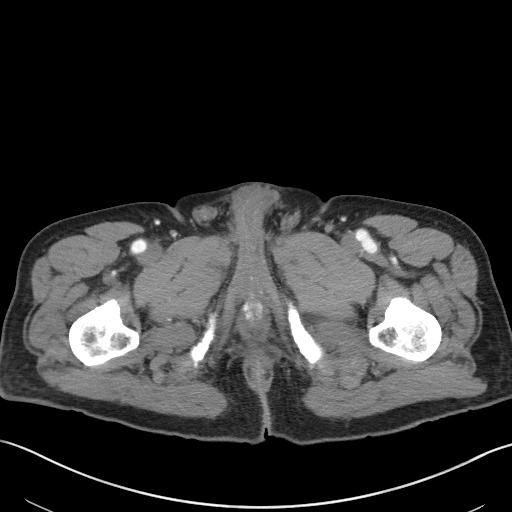
[im 6/88  bone]
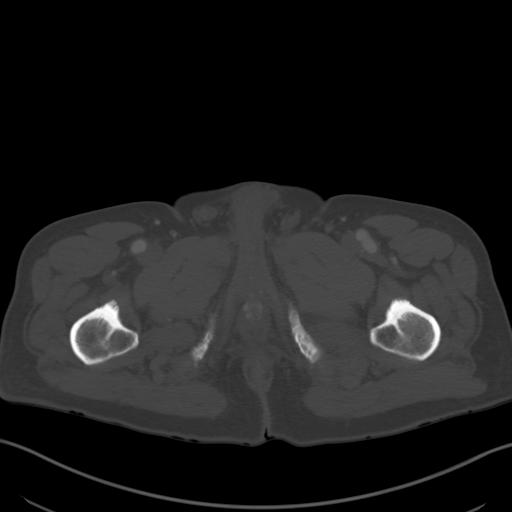
[im 12/88  soft-tissue]
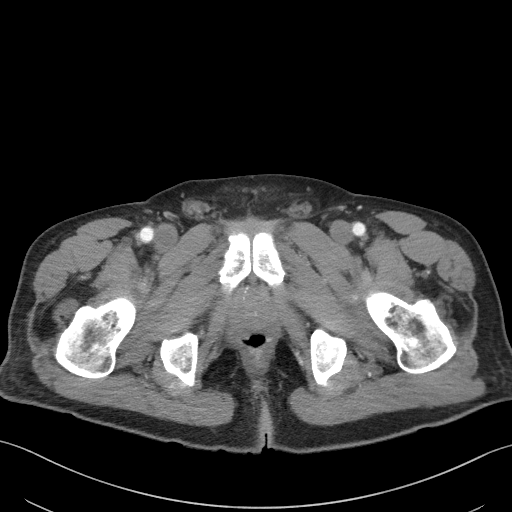
[im 18/88  soft-tissue]
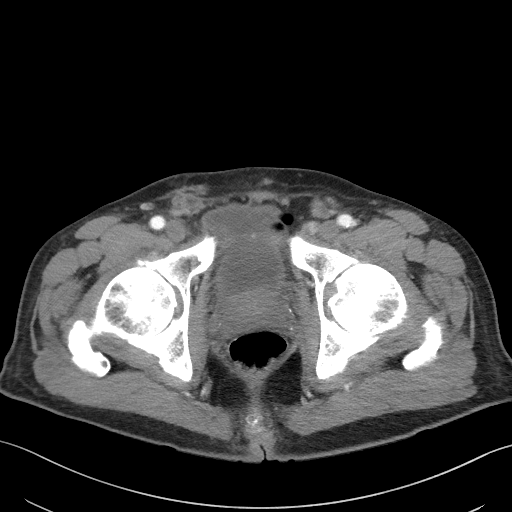
[im 24/88  soft-tissue]
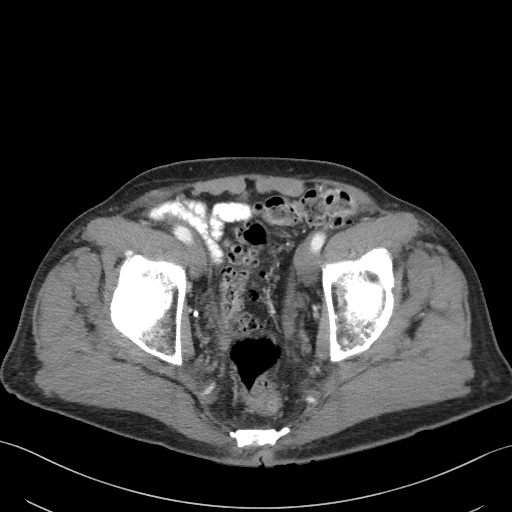
[im 30/88  soft-tissue]
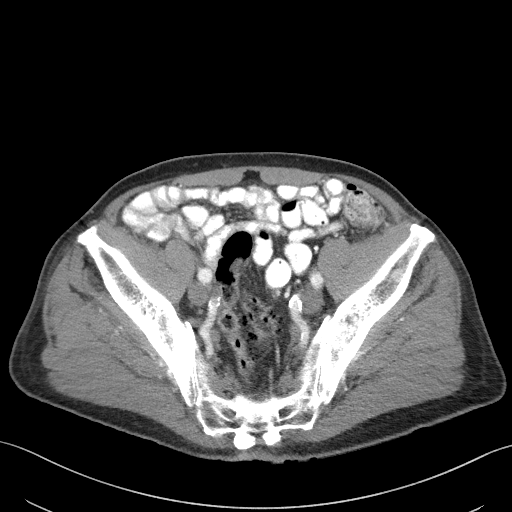
[im 35/88  soft-tissue]
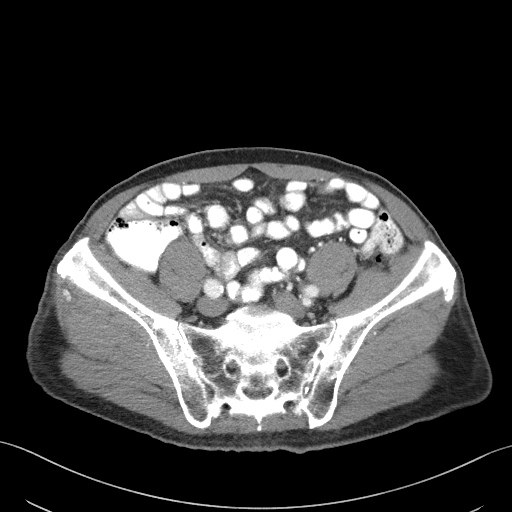
[im 47/88  soft-tissue]
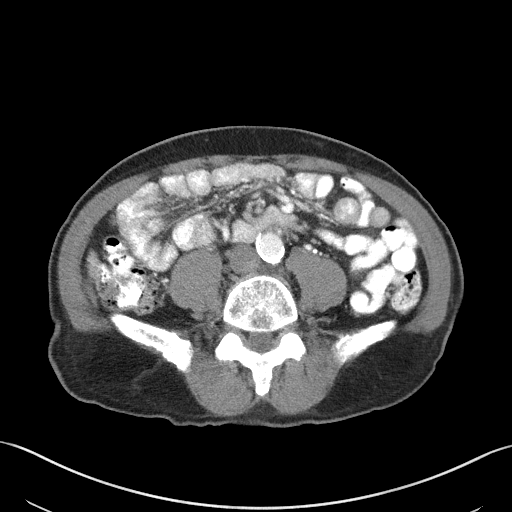
[im 53/88  soft-tissue]
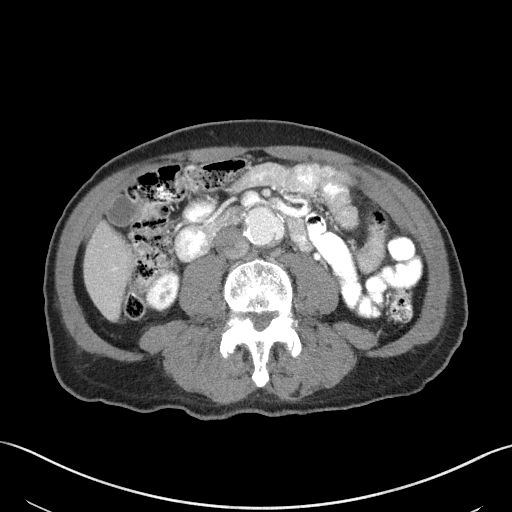
[im 59/88  soft-tissue]
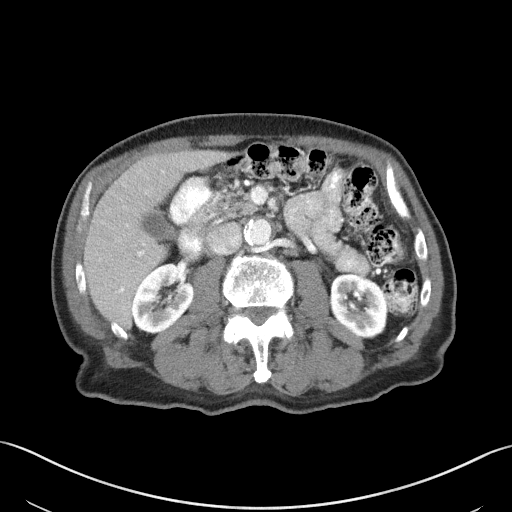
[im 59/88  bone]
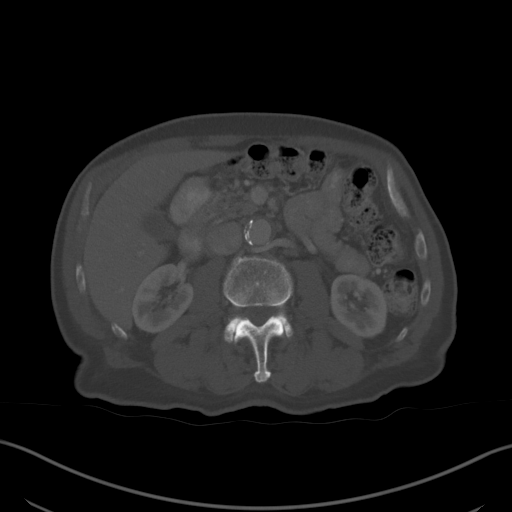
[im 64/88  soft-tissue]
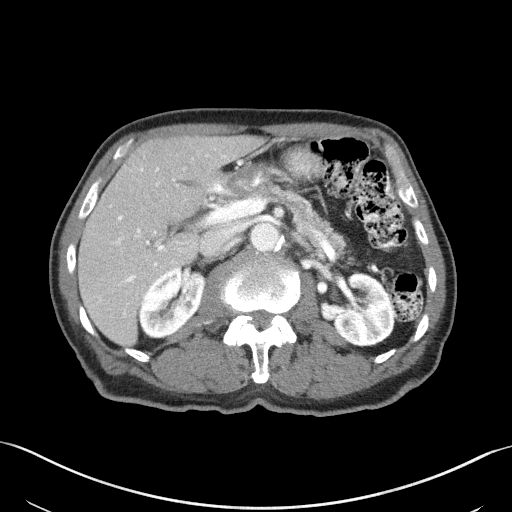
[im 70/88  soft-tissue]
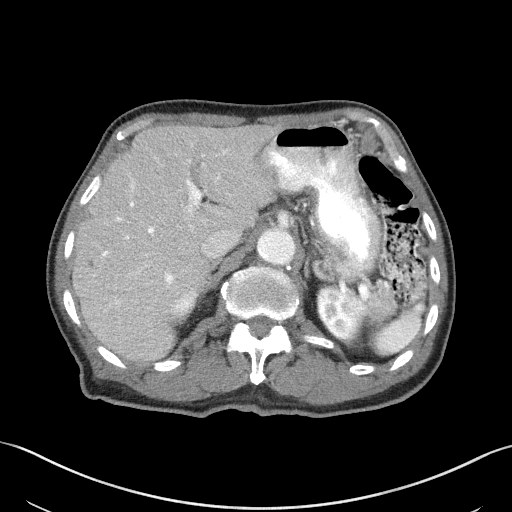
[im 76/88  soft-tissue]
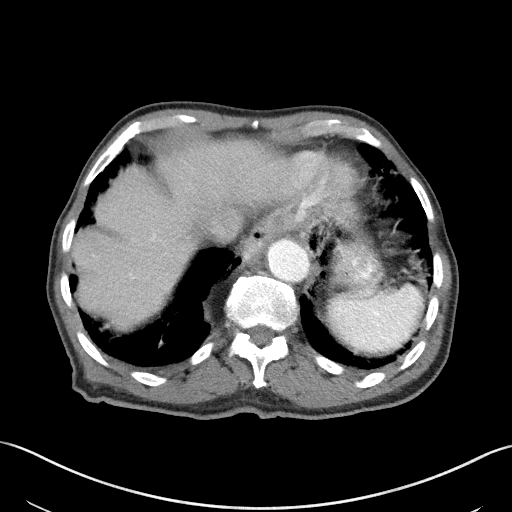
[im 82/88  soft-tissue]
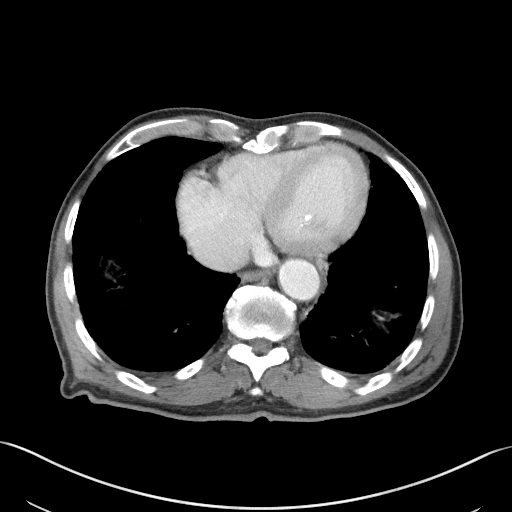

[Series 6: coronal st · coronal · 0.77mm/px · 3 of 100 slices shown]
[im 34/100  soft-tissue]
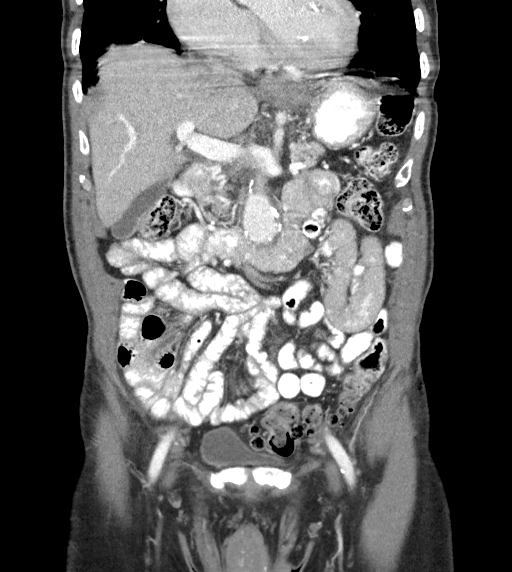
[im 45/100  soft-tissue]
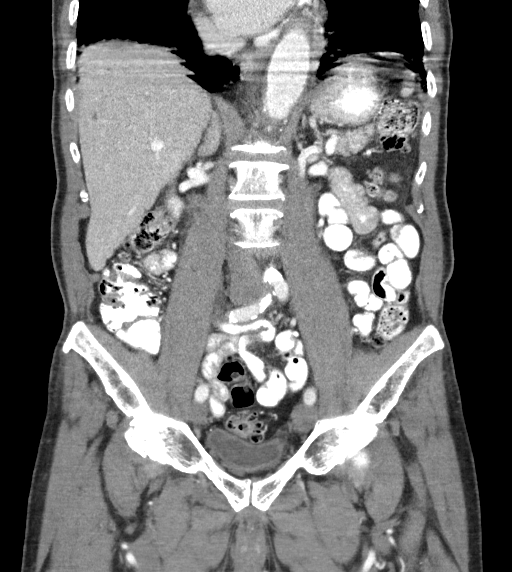
[im 56/100  soft-tissue]
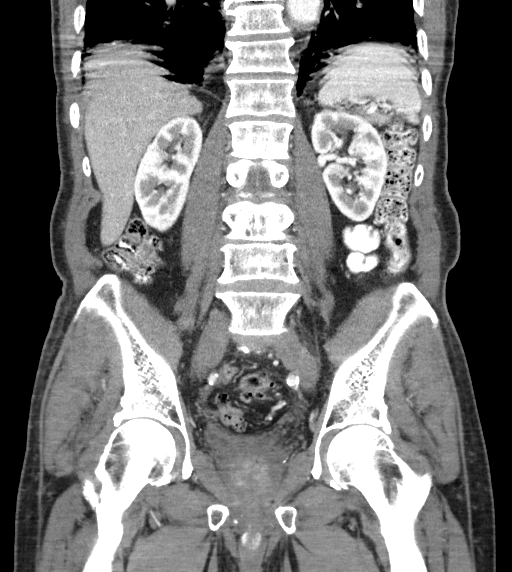

[16 of 46 positions shown; findings below may reference images not displayed]

FINDINGS: Lower chest: Cardiomegaly. Scarring in the lung bases, similar to
prior study. No effusions or acute abnormality.

Hepatobiliary: Diffuse low-density throughout the liver compatible
with fatty infiltration. No focal abnormality. Gallbladder
unremarkable.

Pancreas: No focal abnormality or ductal dilatation.

Spleen: No focal abnormality.  Normal size.

Adrenals/Urinary Tract: No adrenal abnormality. No focal renal
abnormality. No stones or hydronephrosis. Urinary bladder is
unremarkable.

Stomach/Bowel: Sigmoid diverticulosis. Moderate stool burden
throughout the colon. Stomach and small bowel decompressed.

Vascular/Lymphatic: 3.1 cm abdominal aortic aneurysm is stable since
prior study. Calcified and noncalcified plaque. No adenopathy

Reproductive: Prostate enlargement.

Other: No free fluid or free air.

Musculoskeletal: Degenerative changes in the lumbar spine. No acute
bony abnormality.
IMPRESSION: Stable 3.1 cm ascending thoracic aortic aneurysm. Aortic
atherosclerosis. Recommend followup by ultrasound in 3 years. This
recommendation follows ACR consensus guidelines: White Paper of the
ACR Incidental Findings Committee II on Vascular Findings. [HOSPITAL] 6971; [DATE]. Aortic aneurysm NOS (3USVA-C6V.X)

Fatty infiltration of the liver.

Sigmoid diverticulosis. No active diverticulitis. Moderate stool
burden.

Prostate enlargement.

## 2021-12-04 ENCOUNTER — Other Ambulatory Visit: Payer: Self-pay | Admitting: Family Medicine

## 2022-01-10 ENCOUNTER — Ambulatory Visit: Payer: Medicare HMO

## 2022-01-16 ENCOUNTER — Ambulatory Visit: Payer: Medicare HMO

## 2022-01-23 ENCOUNTER — Ambulatory Visit (INDEPENDENT_AMBULATORY_CARE_PROVIDER_SITE_OTHER): Payer: Medicare HMO | Admitting: *Deleted

## 2022-01-23 DIAGNOSIS — Z23 Encounter for immunization: Secondary | ICD-10-CM | POA: Diagnosis not present

## 2022-02-02 ENCOUNTER — Ambulatory Visit (INDEPENDENT_AMBULATORY_CARE_PROVIDER_SITE_OTHER): Payer: Medicare HMO | Admitting: Nurse Practitioner

## 2022-02-02 VITALS — BP 138/80 | HR 73 | Temp 97.2°F | Ht 70.0 in | Wt 165.0 lb

## 2022-02-02 DIAGNOSIS — Z0000 Encounter for general adult medical examination without abnormal findings: Secondary | ICD-10-CM

## 2022-02-02 MED ORDER — FAMOTIDINE 20 MG PO TABS
ORAL_TABLET | ORAL | 1 refills | Status: DC
Start: 2022-02-02 — End: 2022-03-26

## 2022-02-02 NOTE — Progress Notes (Signed)
   Subjective:    Patient ID: [name], male    DOB: 1925/04/28, 88 y.o.   MRN: [id number]  HPI AWV- Annual Wellness Visit  The patient was seen for their annual wellness visit. The patient's past medical history, surgical history, and family history were reviewed. Pertinent vaccines were reviewed ( tetanus, pneumonia, shingles, flu) The patient's medication list was reviewed and updated.  The height and weight were entered.  BMI recorded in electronic record elsewhere  Cognitive screening was completed. Outcome of Mini - Cog: 3   Falls /depression screening electronically recorded within record elsewhere  Current tobacco usage:none. Stopped 20 years ago.  (All patients who use tobacco were given written and verbal information on quitting)  Recent listing of emergency department/hospitalizations over the past year were reviewed.  current specialist the patient sees on a regular basis: podiatry  No falls over the past year.    Medicare annual wellness visit patient questionnaire was reviewed.  A written screening schedule for the patient for the next 5-10 years was given. Appropriate discussion of followup regarding next visit was discussed.      Review of Systems  Neurological:  Positive for dizziness.       Patient complains of dizziness last night and some this morning that went away. Patient describes it as "Swimmy headed" Denies headaches, changes to vision, weakness, facial drooping, slurred speech.        Objective:   Physical Exam Vitals reviewed.  Constitutional:      General: He is not in acute distress.    Appearance: Normal appearance. He is normal weight. He is not ill-appearing, toxic-appearing or diaphoretic.  HENT:     Head: Normocephalic and atraumatic.  Musculoskeletal:     Comments: Ambulates with cane without difficulty.   Neurological:     Mental Status: He is alert.  Psychiatric:        Mood and Affect: Mood normal.         Behavior: Behavior normal.     Comments: Speech intact           Assessment & Plan:   1. Encounter for Medicare annual wellness exam Adult wellness-complete.wellness physical was conducted today. Importance of diet and exercise were discussed in detail.  Importance of stress reduction and healthy living were discussed.  In addition to this a discussion regarding safety was also covered.  We also reviewed over immunizations and gave recommendations regarding current immunization needed for age.   In addition to this additional areas were also touched on including: Preventative health exams needed:  Colonoscopy not indicated COVID vaccine #4 declined today Shingrix declined today Pneumonia vaccine up to date Flu vaccine up to date  Patient was advised yearly wellness exam   2. Dizziness - Follow up with PCP - Remember to eat and drink to remain hydrated and well nourished.

## 2022-02-02 NOTE — Patient Instructions (Addendum)
Thank you for coming for your annual wellness visit.  Please follow through on any advice that was given to you by today's visit. Remember to maintain compliance with your medications as discussed today.  Also remember it is important to eat a healthy diet and to stay physically active on a daily basis.  Please follow through with any testing or recommended followup office visits as was discussed today. You are due the following test coming up:   You are up to date.   Finally remembered that the annual wellness visit does not take the place of regularly scheduled office visits  chronic health problems such as hypertension/diabetes/cholesterol visits.

## 2022-02-11 ENCOUNTER — Telehealth: Payer: Self-pay | Admitting: *Deleted

## 2022-02-11 NOTE — Patient Outreach (Signed)
  Care Coordination   Initial Visit Note   02/11/2022 Name: [name] MRN: [id number] DOB: 27-Dec-1925  [name] is a 88 y.o. year old male who sees Luking, Elayne Snare, MD for primary care. I spoke with  [name] by phone today.  What matters to the patients health and wellness today?  NO NEEDS.   SDOH assessments and interventions completed:  Yes  SDOH Interventions Today    Flowsheet Row Most Recent Value  SDOH Interventions   Food Insecurity Interventions Intervention Not Indicated  Housing Interventions Intervention Not Indicated  Transportation Interventions Intervention Not Indicated  Utilities Interventions Intervention Not Indicated        Care Coordination Interventions:  No, not indicated   Follow up plan: No further intervention required.   Encounter Outcome:  Pt. Visit Completed   Troy Memos C. Myrtie Neither, MSN, [facility] Gerontological Nurse Practitioner [facility] [phone]

## 2022-03-05 ENCOUNTER — Other Ambulatory Visit: Payer: Self-pay | Admitting: Family Medicine

## 2022-03-05 ENCOUNTER — Telehealth: Payer: Self-pay

## 2022-03-05 NOTE — Telephone Encounter (Signed)
Was completed thank you

## 2022-03-05 NOTE — Telephone Encounter (Signed)
Caller name: DURANT SCIBILIA  On DPR?: Yes  Call back number: [phone]  Provider they see: [name], MD  Reason for call:Pt dropped off form Disability License Plate form needs to be completed placed in Dr Nicki Reaper folder

## 2022-03-05 NOTE — Telephone Encounter (Signed)
Please advise. Thank you

## 2022-03-12 ENCOUNTER — Emergency Department (HOSPITAL_COMMUNITY): Payer: Medicare HMO

## 2022-03-12 ENCOUNTER — Encounter (HOSPITAL_COMMUNITY): Payer: Self-pay

## 2022-03-12 ENCOUNTER — Ambulatory Visit (INDEPENDENT_AMBULATORY_CARE_PROVIDER_SITE_OTHER): Payer: Medicare HMO | Admitting: Family Medicine

## 2022-03-12 ENCOUNTER — Other Ambulatory Visit: Payer: Self-pay

## 2022-03-12 ENCOUNTER — Observation Stay (HOSPITAL_COMMUNITY): Payer: Medicare HMO

## 2022-03-12 ENCOUNTER — Inpatient Hospital Stay (HOSPITAL_COMMUNITY)
Admission: EM | Admit: 2022-03-12 | Discharge: 2022-03-17 | DRG: 291 | Disposition: A | Discharge status: Home / Self Care | Payer: Medicare HMO | Attending: Internal Medicine | Admitting: Internal Medicine

## 2022-03-12 VITALS — BP 139/89 | Wt 170.2 lb

## 2022-03-12 DIAGNOSIS — I130 Hypertensive heart and chronic kidney disease with heart failure and stage 1 through stage 4 chronic kidney disease, or unspecified chronic kidney disease: Secondary | ICD-10-CM | POA: Diagnosis not present

## 2022-03-12 DIAGNOSIS — Z87891 Personal history of nicotine dependence: Secondary | ICD-10-CM

## 2022-03-12 DIAGNOSIS — R600 Localized edema: Secondary | ICD-10-CM

## 2022-03-12 DIAGNOSIS — J45909 Unspecified asthma, uncomplicated: Secondary | ICD-10-CM | POA: Diagnosis present

## 2022-03-12 DIAGNOSIS — J439 Emphysema, unspecified: Secondary | ICD-10-CM | POA: Diagnosis not present

## 2022-03-12 DIAGNOSIS — Z1152 Encounter for screening for COVID-19: Secondary | ICD-10-CM | POA: Diagnosis not present

## 2022-03-12 DIAGNOSIS — I10 Essential (primary) hypertension: Secondary | ICD-10-CM

## 2022-03-12 DIAGNOSIS — I2489 Other forms of acute ischemic heart disease: Secondary | ICD-10-CM | POA: Diagnosis present

## 2022-03-12 DIAGNOSIS — I700 Atherosclerosis of aorta: Secondary | ICD-10-CM | POA: Diagnosis not present

## 2022-03-12 DIAGNOSIS — I447 Left bundle-branch block, unspecified: Secondary | ICD-10-CM | POA: Diagnosis not present

## 2022-03-12 DIAGNOSIS — I255 Ischemic cardiomyopathy: Secondary | ICD-10-CM | POA: Diagnosis not present

## 2022-03-12 DIAGNOSIS — I5020 Unspecified systolic (congestive) heart failure: Secondary | ICD-10-CM | POA: Diagnosis not present

## 2022-03-12 DIAGNOSIS — Z880 Allergy status to penicillin: Secondary | ICD-10-CM | POA: Diagnosis not present

## 2022-03-12 DIAGNOSIS — R001 Bradycardia, unspecified: Secondary | ICD-10-CM | POA: Diagnosis not present

## 2022-03-12 DIAGNOSIS — D696 Thrombocytopenia, unspecified: Secondary | ICD-10-CM | POA: Insufficient documentation

## 2022-03-12 DIAGNOSIS — J90 Pleural effusion, not elsewhere classified: Secondary | ICD-10-CM | POA: Diagnosis present

## 2022-03-12 DIAGNOSIS — Z96651 Presence of right artificial knee joint: Secondary | ICD-10-CM | POA: Diagnosis present

## 2022-03-12 DIAGNOSIS — J9601 Acute respiratory failure with hypoxia: Secondary | ICD-10-CM | POA: Diagnosis not present

## 2022-03-12 DIAGNOSIS — Z8249 Family history of ischemic heart disease and other diseases of the circulatory system: Secondary | ICD-10-CM | POA: Diagnosis not present

## 2022-03-12 DIAGNOSIS — E876 Hypokalemia: Secondary | ICD-10-CM | POA: Diagnosis not present

## 2022-03-12 DIAGNOSIS — I5021 Acute systolic (congestive) heart failure: Secondary | ICD-10-CM | POA: Diagnosis not present

## 2022-03-12 DIAGNOSIS — M109 Gout, unspecified: Secondary | ICD-10-CM | POA: Diagnosis present

## 2022-03-12 DIAGNOSIS — E782 Mixed hyperlipidemia: Secondary | ICD-10-CM | POA: Diagnosis present

## 2022-03-12 DIAGNOSIS — J168 Pneumonia due to other specified infectious organisms: Secondary | ICD-10-CM | POA: Diagnosis not present

## 2022-03-12 DIAGNOSIS — I2510 Atherosclerotic heart disease of native coronary artery without angina pectoris: Secondary | ICD-10-CM | POA: Diagnosis present

## 2022-03-12 DIAGNOSIS — K219 Gastro-esophageal reflux disease without esophagitis: Secondary | ICD-10-CM | POA: Diagnosis present

## 2022-03-12 DIAGNOSIS — I509 Heart failure, unspecified: Secondary | ICD-10-CM | POA: Diagnosis not present

## 2022-03-12 DIAGNOSIS — J189 Pneumonia, unspecified organism: Principal | ICD-10-CM

## 2022-03-12 DIAGNOSIS — Z79899 Other long term (current) drug therapy: Secondary | ICD-10-CM

## 2022-03-12 DIAGNOSIS — R0602 Shortness of breath: Secondary | ICD-10-CM | POA: Diagnosis not present

## 2022-03-12 DIAGNOSIS — N1832 Chronic kidney disease, stage 3b: Secondary | ICD-10-CM | POA: Diagnosis not present

## 2022-03-12 DIAGNOSIS — R0902 Hypoxemia: Secondary | ICD-10-CM | POA: Diagnosis not present

## 2022-03-12 DIAGNOSIS — R0609 Other forms of dyspnea: Secondary | ICD-10-CM

## 2022-03-12 DIAGNOSIS — Z800 Family history of malignant neoplasm of digestive organs: Secondary | ICD-10-CM | POA: Diagnosis not present

## 2022-03-12 DIAGNOSIS — E785 Hyperlipidemia, unspecified: Secondary | ICD-10-CM | POA: Diagnosis present

## 2022-03-12 DIAGNOSIS — J9690 Respiratory failure, unspecified, unspecified whether with hypoxia or hypercapnia: Secondary | ICD-10-CM | POA: Diagnosis not present

## 2022-03-12 LAB — BASIC METABOLIC PANEL
Anion gap: 11 (ref 5–15)
BUN: 14 mg/dL (ref 8–23)
CO2: 21 mmol/L — ABNORMAL LOW (ref 22–32)
Calcium: 8.8 mg/dL — ABNORMAL LOW (ref 8.9–10.3)
Chloride: 109 mmol/L (ref 98–111)
Creatinine, Ser: 1.34 mg/dL — ABNORMAL HIGH (ref 0.61–1.24)
GFR, Estimated: 49 mL/min — ABNORMAL LOW (ref >60)
Glucose, Bld: 98 mg/dL (ref 70–99)
Potassium: 3.4 mmol/L — ABNORMAL LOW (ref 3.5–5.1)
Sodium: 141 mmol/L (ref 135–145)

## 2022-03-12 LAB — CBC
HCT: 39.9 % (ref 39.0–52.0)
Hemoglobin: 12.4 g/dL — ABNORMAL LOW (ref 13.0–17.0)
MCH: 31.9 pg (ref 26.0–34.0)
MCHC: 31.1 g/dL (ref 30.0–36.0)
MCV: 102.6 fL — ABNORMAL HIGH (ref 80.0–100.0)
Platelets: 105 10*3/uL — ABNORMAL LOW (ref 150–400)
RBC: 3.89 MIL/uL — ABNORMAL LOW (ref 4.22–5.81)
RDW: 12.4 % (ref 11.5–15.5)
WBC: 8.1 10*3/uL (ref 4.0–10.5)
nRBC: 0 % (ref 0.0–0.2)

## 2022-03-12 LAB — RESP PANEL BY RT-PCR (RSV, FLU A&B, COVID)  RVPGX2
Influenza A by PCR: NEGATIVE
Influenza B by PCR: NEGATIVE
Resp Syncytial Virus by PCR: NEGATIVE
SARS Coronavirus 2 by RT PCR: NEGATIVE

## 2022-03-12 LAB — BRAIN NATRIURETIC PEPTIDE: B Natriuretic Peptide: 492 pg/mL — ABNORMAL HIGH (ref 0.0–100.0)

## 2022-03-12 LAB — TROPONIN I (HIGH SENSITIVITY)
Troponin I (High Sensitivity): 83 ng/L — ABNORMAL HIGH (ref <18)
Troponin I (High Sensitivity): 85 ng/L — ABNORMAL HIGH (ref <18)

## 2022-03-12 LAB — MAGNESIUM: Magnesium: 1.6 mg/dL — ABNORMAL LOW (ref 1.7–2.4)

## 2022-03-12 LAB — PROCALCITONIN: Procalcitonin: <0.1 ng/mL

## 2022-03-12 MED ORDER — IPRATROPIUM-ALBUTEROL 0.5-2.5 (3) MG/3ML IN SOLN
3.0000 mL | Freq: Once | RESPIRATORY_TRACT | Status: AC
  Administered 2022-03-12: 3 mL via RESPIRATORY_TRACT
  Filled 2022-03-12: qty 3

## 2022-03-12 MED ORDER — IOHEXOL 350 MG/ML SOLN
75.0000 mL | Freq: Once | INTRAVENOUS | Status: AC | PRN
  Administered 2022-03-12: 75 mL via INTRAVENOUS

## 2022-03-12 MED ORDER — SODIUM CHLORIDE 0.9 % IV SOLN
1.0000 g | Freq: Once | INTRAVENOUS | Status: AC
  Administered 2022-03-12: 1 g via INTRAVENOUS
  Filled 2022-03-12: qty 10

## 2022-03-12 MED ORDER — POTASSIUM CHLORIDE CRYS ER 10 MEQ PO TBCR: 10.0000 meq | EXTENDED_RELEASE_TABLET | Freq: Two times a day (BID) | ORAL | 1 refills | Status: DC

## 2022-03-12 MED ORDER — FUROSEMIDE 20 MG PO TABS
ORAL_TABLET | ORAL | 1 refills | Status: DC
Start: 2022-03-12 — End: 2022-03-17

## 2022-03-12 MED ORDER — DOXYCYCLINE HYCLATE 100 MG PO TABS
100.0000 mg | ORAL_TABLET | Freq: Once | ORAL | Status: AC
  Administered 2022-03-12: 100 mg via ORAL
  Filled 2022-03-12: qty 1

## 2022-03-12 MED ORDER — ALBUTEROL SULFATE (2.5 MG/3ML) 0.083% IN NEBU: 2.5000 mg | INHALATION_SOLUTION | RESPIRATORY_TRACT | Status: DC | PRN

## 2022-03-12 MED ORDER — POTASSIUM CHLORIDE CRYS ER 20 MEQ PO TBCR
40.0000 meq | EXTENDED_RELEASE_TABLET | ORAL | Status: AC
  Administered 2022-03-12 – 2022-03-13 (×2): 40 meq via ORAL
  Filled 2022-03-12 (×2): qty 2

## 2022-03-12 MED ORDER — ALBUTEROL SULFATE HFA 108 (90 BASE) MCG/ACT IN AERS
2.0000 | INHALATION_SPRAY | RESPIRATORY_TRACT | Status: DC | PRN
  Administered 2022-03-12: 2 via RESPIRATORY_TRACT
  Filled 2022-03-12: qty 6.7

## 2022-03-12 NOTE — H&P (Addendum)
History and Physical    [name] OMV:[id number] DOB: January 21, 1926 DOA: 03/12/2022  PCP: [name], MD   Patient coming from: Home  I have personally briefly reviewed patient's old medical records in [place]  Chief Complaint: Difficulty breathing  HPI: [name] is a 88 y.o. male with medical history significant for asthma, hypertension, gout. Patient presented to the ED with complaints of difficulty breathing ongoing for several years but significantly worse over the past 2 to 3 days.  Reports a history of asthma but has not had any exacerbation in decades.  He denies cough.  Denies chest pain, but reports bilateral upper arm discomfort that is not related to movement of his arm. Reports chronic bilateral lower extremity swelling, sometimes worse sometimes better.  He is on Lasix '20mg'$  as needed.  Went to see his outpatient provider today, EKG was done and was concerning for new left bundle branch block.  He was referred to the ED.  ED Course: Sats and vitals initially stable.  But on my evaluation, patient got up to void, he became tachycardic up to 140s, hypoxic sats down to 83, and appeared dyspneic with increased work of breathing.  Was placed on 4 L with improvement in sats to greater than 92%. Two-view chest x-ray shows new bilateral pleural effusions and bibasilar atelectasis or infiltrates. EKG showed new left bundle branch block. Troponin 83 > 85 WBC 8.1.  Potassium 3.4.  Patient is symptomatic from cardiac standpoint.  EDP talked to Dr. Acie Fredrickson, felt EKG changes and troponin may be secondary to demand ischemia from pneumonia.  No need for heparin at this time.  Okay to admit here.  Cardiology team to see in consult  Review of Systems: As per HPI all other systems reviewed and negative.  Past Medical History:  Diagnosis Date   Asthma    Diverticulosis    GERD (gastroesophageal reflux disease)    Gout    Heart murmur    Evaluation by a  cardiologist years ago was reportedly negative   HTN (hypertension)    Osteoarthritis    Reflux     Past Surgical History:  Procedure Laterality Date   APPENDECTOMY  2007   COLONOSCOPY  2003   COLONOSCOPY N/A 12/28/2012   Procedure: COLONOSCOPY;  Surgeon: Rogene Houston, MD;  Location: [facility];  Service: Endoscopy;  Laterality: N/A;  255-rescheduled to 10:30 Ann notified pt   EYE SURGERY Right    cataract removal   INGUINAL HERNIA REPAIR     Right   JOINT REPLACEMENT     right total knee RIGHT total knee arthroplasty   TOTAL KNEE ARTHROPLASTY  09/2009   Right     reports that he quit smoking about 54 years ago. His smoking use included pipe. He started smoking about 82 years ago. He has never used smokeless tobacco. He reports that he does not drink alcohol and does not use drugs.  Allergies  Allergen Reactions   Penicillin G Benzathine     Other reaction(s): Unknown   Penicillins Rash    Family History  Problem Relation Age of Onset   Hypertension Mother    Hypertension Father    Heart attack Father    Cancer Brother        colon   Arthritis Other    Heart disease Other        Male < 86   Uterine cancer Maternal Aunt     Prior to Admission medications  Medication Sig Start Date End Date Taking? Authorizing Provider  famotidine (PEPCID) 20 MG tablet TAKE (1) TABLET BY MOUTH TWICE DAILY. 02/02/22  Yes Ameduite, Trenton Gammon, FNP  furosemide (LASIX) 20 MG tablet 1 daily as needed pedal edema 03/12/22  Yes Luking, Elayne Snare, MD  mupirocin ointment (BACTROBAN) 2 % Apply 1 Application topically 2 (two) times daily. 10/28/21  Yes Luking, Scott A, MD  psyllium (HYDROCIL/METAMUCIL) 95 % PACK Take 1 packet by mouth daily.   Yes [provider]  VENTOLIN HFA 108 (90 Base) MCG/ACT inhaler INHALE 2 PUFFS EVERY 4 HOURS AS NEEDED. 03/05/22  Yes Luking, Elayne Snare, MD  acetaminophen (TYLENOL) 650 MG CR tablet Take 650-1,300 mg by mouth every 8 (eight) hours as needed for  pain.    [provider]  potassium chloride (KLOR-CON M) 10 MEQ tablet Take 1 tablet (10 mEq total) by mouth 2 (two) times daily. Patient not taking: Reported on 03/12/2022 03/12/22   [name], MD  rosuvastatin (CRESTOR) 20 MG tablet TAKE ONE TABLET BY MOUTH ONCE DAILY. Patient not taking: Reported on 03/12/2022 12/04/21   [name], MD    Physical Exam: Vitals:   03/12/22 1800 03/12/22 1805 03/12/22 1810 03/12/22 1815  BP: (!) 146/103     Pulse:      Resp: '19 19 20 18  '$ Temp:      TempSrc:      SpO2:      Weight:      Height:        Constitutional: Increased work of breathing, Vitals:   03/12/22 1800 03/12/22 1805 03/12/22 1810 03/12/22 1815  BP: (!) 146/103     Pulse:      Resp: '19 19 20 18  '$ Temp:      TempSrc:      SpO2:      Weight:      Height:       Eyes: PERRL, lids and conjunctivae normal ENMT: Mucous membranes are moist. . Neck: normal, supple, no masses, no thyromegaly Respiratory:  Diffuse expiratory wheezing, increased work of breathing and accessory muscle use after exertion, gradually improved with rest Cardiovascular: Became tachycardic to 140s, gradually improved to low 110s regular rate and rhythm, no murmurs / rubs / gallops.  1+ pitting bilateral extremity edema.  Abdomen: no tenderness, no masses palpated. No hepatosplenomegaly. Bowel sounds positive.  Musculoskeletal: no clubbing / cyanosis. No joint deformity upper and lower extremities.  Skin: no rashes, lesions, ulcers. No induration Neurologic: No apparent cranial nerve abnormality, moving extremities spontaneously.Marland Kitchen  Psychiatric: Normal judgment and insight. Alert and oriented x 3. Normal mood.   Labs on Admission: I have personally reviewed following labs and imaging studies  CBC: Recent Labs  Lab 03/12/22 1550  WBC 8.1  HGB 12.4*  HCT 39.9  MCV 102.6*  PLT 778*   Basic Metabolic Panel: Recent Labs  Lab 03/12/22 1550  NA 141  K 3.4*  CL 109  CO2 21*   GLUCOSE 98  BUN 14  CREATININE 1.34*  CALCIUM 8.8*   Radiological Exams on Admission: DG Chest 2 View  Result Date: 03/12/2022 CLINICAL DATA:  Shortness of breath with exertion over the past couple of weeks. EXAM: CHEST - 2 VIEW COMPARISON:  09/18/2021 FINDINGS: Lungs are hyperinflated. There are new bilateral pleural effusions. There is significant bibasilar opacity obscuring the hemidiaphragms, consistent with atelectasis or infiltrates. No evidence for pulmonary edema. IMPRESSION: New bilateral pleural effusions and bibasilar atelectasis or infiltrates. Electronically Signed  By: Nolon Nations M.D.   On: 03/12/2022 16:00    EKG: Independently reviewed.  Sinus rhythm rate 93, QTc prolonged 527.  Assessment/Plan Principal Problem:   Acute hypoxic respiratory failure (HCC) Active Problems:   Acute congestive heart failure (HCC)   LBBB (left bundle branch block)   HTN (hypertension)   Stage 3b chronic kidney disease (HCC)   Assessment and Plan: * Acute hypoxic respiratory failure ([place]) With activity O2 sats down to 83%, became dyspneic, tachycardic, tachypneic.  Improved with O2 sat- 4 L sats now > 93%.  CTA chest showing bilateral pleural effusions, with compressive atelectasis of the lower lobes versus pneumonia.  Wheezing on exam- Cardiac wheeze versus asthma exacerbation.  Afebrile without leukocytosis. -Albuterol nebs as needed and scheduled -Check procalcitonin -IV ceftriaxone and doxycycline started in ED, will continue, Low threshold to discontinue  Acute congestive heart failure ([place]) Unspecified type.  1+ pitting bilateral lower extremity, chest CTA showing bilateral moderate pleural effusions, BNP elevated 492. -IV Lasix 40 twice daily -Obtain echocardiogram -Daily BMP strict input output, daily weights  LBBB (left bundle branch block) EKG With new left bundle branch block.  He reports bilateral shoulder pains, no chest pain. Troponins 83> 85.  Also QTc prolonged  527.  Potassium 3.4 - EDP- talked to Dr. Arletta Bale demand ischemia secondary to pneumonia, cardiology team to see in a.m., okay to stay here, no need for heparin at this time. - Repeat EKG in the morning -Echocardiogram - Replete K -check Magnesium   Stage 3b chronic kidney disease ([place]) Creatinine 1.34, -IV Lasix 40 mg twice daily -Monitor closely with contrast exposure and now on diuretics  HTN (hypertension) Systolic 834H to 962I. -Not on antihypertensives,    DVT prophylaxis: Heparin Code Status: Full- Confirmed with patient at bedside Family Communication:  None at bedside Disposition Plan: ~ 2 days Consults called: Cardiology Admission status: Inpt tele I certify that at the point of admission it is my clinical judgment that the patient will require inpatient hospital care spanning beyond 2 midnights from the point of admission due to high intensity of service, high risk for further deterioration and high frequency of surveillance required.    Author: Bethena Roys, MD 03/12/2022 9:17 PM  For on call review [email or web address].

## 2022-03-12 NOTE — Assessment & Plan Note (Addendum)
Unspecified type.  1+ pitting bilateral lower extremity, chest CTA showing bilateral moderate pleural effusions, BNP elevated 492. -IV Lasix 40 twice daily -Obtain echocardiogram -Daily BMP strict input output, daily weights

## 2022-03-12 NOTE — ED Triage Notes (Addendum)
Patient states he has increased SHOB with exertion over the past couple of weeks. Per Dr. Montez Morita, patient was advised to come to ED for new LBBB and diaphoresis last night and dizziness continued today. Patient also states bilateral arm pain and neck pain.

## 2022-03-12 NOTE — Assessment & Plan Note (Signed)
Systolic 867E to 720N. -Not on antihypertensives,

## 2022-03-12 NOTE — ED Provider Notes (Signed)
[facility] EMERGENCY DEPARTMENT Provider Note   CSN: [id number] Arrival date & time: 03/12/22  1445     History  Chief Complaint  Patient presents with   Shortness of Breath    [name] is a 88 y.o. male presenting from his primary care doctor's office for workup of shortness of breath and chest pain.  Patient reports he has had some atypical episodes of diaphoresis and also discomfort in his bilateral arms, no specific chest pain per se, for the past 2 to 3 days.  He was seen by his PCP today who was concerned that his EKG showed a potential new left bundle branch block and sent him to the ED for further workup.  Patient denies history of coronary disease that he is aware of, does not see a cardiologist, denies history of MI or cardiac stent.  He is not on aspirin.  He does not smoke.  He reports he has been feeling short of breath on and off for the past "month".  HPI     Home Medications Prior to Admission medications   Medication Sig Start Date End Date Taking? Authorizing Provider  famotidine (PEPCID) 20 MG tablet TAKE (1) TABLET BY MOUTH TWICE DAILY. 02/02/22  Yes Ameduite, Trenton Gammon, FNP  furosemide (LASIX) 20 MG tablet 1 daily as needed pedal edema 03/12/22  Yes Luking, Elayne Snare, MD  mupirocin ointment (BACTROBAN) 2 % Apply 1 Application topically 2 (two) times daily. 10/28/21  Yes Luking, Scott A, MD  psyllium (HYDROCIL/METAMUCIL) 95 % PACK Take 1 packet by mouth daily.   Yes [provider]  VENTOLIN HFA 108 (90 Base) MCG/ACT inhaler INHALE 2 PUFFS EVERY 4 HOURS AS NEEDED. 03/05/22  Yes Luking, Elayne Snare, MD  acetaminophen (TYLENOL) 650 MG CR tablet Take 650-1,300 mg by mouth every 8 (eight) hours as needed for pain.    [provider]  potassium chloride (KLOR-CON M) 10 MEQ tablet Take 1 tablet (10 mEq total) by mouth 2 (two) times daily. Patient not taking: Reported on 03/12/2022 03/12/22   [name], MD  rosuvastatin (CRESTOR) 20 MG tablet  TAKE ONE TABLET BY MOUTH ONCE DAILY. Patient not taking: Reported on 03/12/2022 12/04/21   [name], MD      Allergies    Penicillin g benzathine and Penicillins    Review of Systems   Review of Systems  Physical Exam Updated Vital Signs BP (!) 151/101   Pulse 94   Temp 97.9 F (36.6 C) (Oral)   Resp (!) 21   Ht '5\' 10"'$  (1.778 m)   Wt 77 kg   SpO2 93%   BMI 24.36 kg/m  Physical Exam Constitutional:      General: He is not in acute distress. HENT:     Head: Normocephalic and atraumatic.  Eyes:     Conjunctiva/sclera: Conjunctivae normal.     Pupils: Pupils are equal, round, and reactive to light.  Cardiovascular:     Rate and Rhythm: Normal rate and regular rhythm.  Pulmonary:     Effort: Pulmonary effort is normal. No respiratory distress.  Abdominal:     General: There is no distension.     Tenderness: There is no abdominal tenderness.  Skin:    General: Skin is warm and dry.  Neurological:     General: No focal deficit present.     Mental Status: He is alert. Mental status is at baseline.  Psychiatric:        Mood and  Affect: Mood normal.        Behavior: Behavior normal.     ED Results / Procedures / Treatments   Labs (all labs ordered are listed, but only abnormal results are displayed) Labs Reviewed  BASIC METABOLIC PANEL - Abnormal; Notable for the following components:      Result Value   Potassium 3.4 (*)    CO2 21 (*)    Creatinine, Ser 1.34 (*)    Calcium 8.8 (*)    GFR, Estimated 49 (*)    All other components within normal limits  CBC - Abnormal; Notable for the following components:   RBC 3.89 (*)    Hemoglobin 12.4 (*)    MCV 102.6 (*)    Platelets 105 (*)    All other components within normal limits  BRAIN NATRIURETIC PEPTIDE - Abnormal; Notable for the following components:   B Natriuretic Peptide 492.0 (*)    All other components within normal limits  TROPONIN I (HIGH SENSITIVITY) - Abnormal; Notable for the following  components:   Troponin I (High Sensitivity) 83 (*)    All other components within normal limits  TROPONIN I (HIGH SENSITIVITY) - Abnormal; Notable for the following components:   Troponin I (High Sensitivity) 85 (*)    All other components within normal limits  RESP PANEL BY RT-PCR (RSV, FLU A&B, COVID)  RVPGX2  PROCALCITONIN  MAGNESIUM    EKG EKG Interpretation  Date/Time:  Thursday March 12 2022 15:21:22 EST Ventricular Rate:  93 PR Interval:  180 QRS Duration: 132 QT Interval:  424 QTC Calculation: 527 R Axis:   79 Text Interpretation: Normal sinus rhythm Left ventricular hypertrophy with QRS widening and repolarization abnormality ( Cornell product ) Cannot rule out Anterior infarct , age undetermined Abnormal ECG When compared with ECG of 18-Sep-2021 17:55, Premature atrial complexes are no longer Present QRS duration has increased new LBBB Confirmed by Octaviano Glow [phone]) on 03/12/2022 5:18:26 PM  Radiology CT Angio Chest Pulmonary Embolism (PE) W or WO Contrast  Result Date: 03/12/2022 CLINICAL DATA:  Acute respiratory failure and hypoxia. EXAM: CT ANGIOGRAPHY CHEST WITH CONTRAST TECHNIQUE: Multidetector CT imaging of the chest was performed using the standard protocol during bolus administration of intravenous contrast. Multiplanar CT image reconstructions and MIPs were obtained to evaluate the vascular anatomy. RADIATION DOSE REDUCTION: This exam was performed according to the departmental dose-optimization program which includes automated exposure control, adjustment of the mA and/or kV according to patient size and/or use of iterative reconstruction technique. CONTRAST:  39m OMNIPAQUE IOHEXOL 350 MG/ML SOLN COMPARISON:  Chest radiograph dated 03/12/2022 and CT dated 09/18/2021. FINDINGS: Evaluation of this exam is limited due to respiratory motion artifact. Cardiovascular: Mild cardiomegaly. Coronary vascular calcification of the LAD. No pericardial effusion. There is  mild atherosclerotic calcification of the thoracic aorta. No aneurysmal dilatation. No pulmonary artery embolus identified. Mediastinum/Nodes: No hilar or mediastinal adenopathy. The esophagus is grossly unremarkable. No mediastinal fluid collection. Lungs/Pleura: Moderate bilateral pleural effusions with partial compressive atelectasis of the lower lobes versus pneumonia. There is diffuse interstitial and interlobular septal prominence consistent with edema. There is background of emphysema. No pneumothorax. The central airways are patent. Upper Abdomen: No acute abnormality. Musculoskeletal: Osteopenia with degenerative changes of the spine. No acute osseous pathology. Review of the MIP images confirms the above findings. IMPRESSION: 1. No CT evidence of pulmonary artery embolus. 2. Moderate bilateral pleural effusions with partial compressive atelectasis of the lower lobes versus pneumonia. 3. Cardiomegaly with coronary vascular calcification of  the LAD. 4.  Aortic Atherosclerosis (ICD10-I70.0). Electronically Signed   By: Anner Crete M.D.   On: 03/12/2022 20:10   DG Chest 2 View  Result Date: 03/12/2022 CLINICAL DATA:  Shortness of breath with exertion over the past couple of weeks. EXAM: CHEST - 2 VIEW COMPARISON:  09/18/2021 FINDINGS: Lungs are hyperinflated. There are new bilateral pleural effusions. There is significant bibasilar opacity obscuring the hemidiaphragms, consistent with atelectasis or infiltrates. No evidence for pulmonary edema. IMPRESSION: New bilateral pleural effusions and bibasilar atelectasis or infiltrates. Electronically Signed   By: Nolon Nations M.D.   On: 03/12/2022 16:00    Procedures Procedures    Medications Ordered in ED Medications  albuterol (VENTOLIN HFA) 108 (90 Base) MCG/ACT inhaler 2 puff (2 puffs Inhalation Given 03/12/22 1817)  ipratropium-albuterol (DUONEB) 0.5-2.5 (3) MG/3ML nebulizer solution 3 mL (has no administration in time range)  potassium  chloride SA (KLOR-CON M) CR tablet 40 mEq (has no administration in time range)  cefTRIAXone (ROCEPHIN) 1 g in sodium chloride 0.9 % 100 mL IVPB (0 g Intravenous Stopped 03/12/22 1856)  doxycycline (VIBRA-TABS) tablet 100 mg (100 mg Oral Given 03/12/22 1817)  iohexol (OMNIPAQUE) 350 MG/ML injection 75 mL (75 mLs Intravenous Contrast Given 03/12/22 1946)    ED Course/ Medical Decision Making/ A&P Clinical Course as of 03/12/22 2046  Thu Mar 12, 2022  1750 I spoke to Dr Katharina Caper from cardiology who advises patient can be admitted locally at [facility] and cardiology can consult on him appear.  At this time this presentation could be consistent with demand ischemia from pneumonia.  The patient is not having any active discomfort at the moment.  The cardiologist did not feel that we needed to initiate heparin at this time, but would treat for pneumonia. [MT]  7619 Admitted to hospitalist.  2nd troponin is flat [MT]    Clinical Course User Index [MT] Delfino Friesen, Carola Rhine, MD                           Medical Decision Making Amount and/or Complexity of Data Reviewed Labs: ordered. Radiology: ordered.  Risk Prescription drug management. Decision regarding hospitalization.   This patient presents to the ED with concern for shortness of breath, arm pain, diaphoresis. This involves an extensive number of treatment options, and is a complaint that carries with it a high risk of complications and morbidity.  The differential diagnosis includes atypical ACS versus pneumonia versus pleural effusion versus anemia versus other  Co-morbidities that complicate the patient evaluation: Age as a risk factor for heart disease  Additional history obtained from patient's granddaughter, daughter at bedside  External records from outside source obtained and reviewed including PE study in July showed coronary calcifications, no acute PE  I ordered and personally interpreted labs.  The pertinent results include:   trop 80's and flat on repeat, covid/flu negative, WBC 8.1, hgb 12.4  I ordered imaging studies including dg chest I independently visualized and interpreted imaging which showed bilateral pleural effusions versus atelectasis or infiltrate I agree with the radiologist interpretation  The patient was maintained on a cardiac monitor.  I personally viewed and interpreted the cardiac monitored which showed an underlying rhythm of: Sinus rhythm  Per my interpretation the patient's ECG shows left bundle branch block which is appear new from prior EKG, minor ST elevations but does not meet Sgarbossa criteria  I ordered medication including Rocephin and doxycycline for community pneumonia.  I  have reviewed the patients home medicines and have made adjustments as needed  Test Considered: Lower suspicion for acute PE in this clinical setting.  After the interventions noted above, I reevaluated the patient and found that they have: stayed the same  Dispostion:  After consideration of the diagnostic results and the patients response to treatment, I feel that the patent would benefit from medical admission.         Final Clinical Impression(s) / ED Diagnoses Final diagnoses:  Pneumonia of both lower lobes due to infectious organism    Rx / DC Orders ED Discharge Orders     None         Brycen Bean, Carola Rhine, MD 03/12/22 2047

## 2022-03-12 NOTE — Progress Notes (Signed)
   Subjective:    Patient ID: [name], male    DOB: 1926/03/11, 88 y.o.   MRN: [id number]  HPI  Patient arrives with breathing issues for 2 weeks- called EMS this am because he couldn't breathe good and they gave him a treatment.  Patient presents relating progressive shortness of breath he believes it started 2 to 3 weeks ago but then got worse over the past few days over the past couple days he has noticed feeling tight in his chest along with shortness of breath some aching up into the shoulders and he had a spell of diaphoresis during the night where he broke out in a sweat with shortness of breath bad enough to call EMS.  EMS came and gave him nebulizer treatment recommended for him to go to the ER and he declined.  He came today to be seen.  He does have a history of pedal edema.  Previous BNP slightly elevated.  Previous EKG normal sinus rhythm narrow complex Review of Systems     Objective:   Physical Exam General-in no acute distress Eyes-no discharge Lungs-respiratory rate normal, CTA CV-no murmurs,RRR Extremities skin warm dry no edema Neuro grossly normal Behavior normal, alert        Assessment & Plan:  New onset left bundle branch block Given his spell of diaphoresis last night tightness in the chest discomfort into the arms cannot rule out the possibility of a heart attack I do not feel comfortable with sending him home with outpatient testing We have spoken with the ER Family will take him there for further evaluation  More than likely will need troponins, BNP, lab work If all is stable and they released him we will do close follow-up with echo and cardiology consult  Obvious sleep if they find underlying issues or problems further testing may well be warranted today  It should be noted that when we were doing the EKG I went ahead and sent in Lasix and potassium into his pharmacy to be utilize for the pedal edema and possible fluid overload but  family has been instructed not to start this currently and await the findings of the ER evaluation first.

## 2022-03-12 NOTE — ED Notes (Signed)
Report given to assigned nurse on 300 unit.

## 2022-03-12 NOTE — Assessment & Plan Note (Signed)
Creatinine 1.34, -IV Lasix 40 mg twice daily -Monitor closely with contrast exposure and now on diuretics

## 2022-03-12 NOTE — Assessment & Plan Note (Addendum)
With activity O2 sats down to 83%, became dyspneic, tachycardic, tachypneic.  Improved with O2 sat- 4 L sats now > 93%.  CTA chest showing bilateral pleural effusions, with compressive atelectasis of the lower lobes versus pneumonia.  Wheezing on exam- Cardiac wheeze versus asthma exacerbation.  Afebrile without leukocytosis. -Albuterol nebs as needed and scheduled -Check procalcitonin -IV ceftriaxone and doxycycline started in ED, will continue, Low threshold to discontinue

## 2022-03-12 NOTE — Assessment & Plan Note (Addendum)
EKG With new left bundle branch block.  He reports bilateral shoulder pains, no chest pain. Troponins 83> 85.  Also QTc prolonged 527.  Potassium 3.4 - EDP- talked to Dr. Arletta Bale demand ischemia secondary to pneumonia, cardiology team to see in a.m., okay to stay here, no need for heparin at this time. - Repeat EKG in the morning -Echocardiogram - Replete K -check Magnesium

## 2022-03-13 ENCOUNTER — Other Ambulatory Visit (HOSPITAL_COMMUNITY): Payer: Self-pay | Admitting: *Deleted

## 2022-03-13 ENCOUNTER — Inpatient Hospital Stay (HOSPITAL_COMMUNITY): Payer: Medicare HMO

## 2022-03-13 DIAGNOSIS — D696 Thrombocytopenia, unspecified: Secondary | ICD-10-CM

## 2022-03-13 DIAGNOSIS — J9601 Acute respiratory failure with hypoxia: Secondary | ICD-10-CM | POA: Diagnosis not present

## 2022-03-13 DIAGNOSIS — N1832 Chronic kidney disease, stage 3b: Secondary | ICD-10-CM | POA: Diagnosis not present

## 2022-03-13 DIAGNOSIS — I509 Heart failure, unspecified: Secondary | ICD-10-CM | POA: Diagnosis not present

## 2022-03-13 DIAGNOSIS — I5021 Acute systolic (congestive) heart failure: Secondary | ICD-10-CM

## 2022-03-13 LAB — CBC
HCT: 35.9 % — ABNORMAL LOW (ref 39.0–52.0)
Hemoglobin: 11.1 g/dL — ABNORMAL LOW (ref 13.0–17.0)
MCH: 31.3 pg (ref 26.0–34.0)
MCHC: 30.9 g/dL (ref 30.0–36.0)
MCV: 101.1 fL — ABNORMAL HIGH (ref 80.0–100.0)
Platelets: 94 10*3/uL — ABNORMAL LOW (ref 150–400)
RBC: 3.55 MIL/uL — ABNORMAL LOW (ref 4.22–5.81)
RDW: 12.3 % (ref 11.5–15.5)
WBC: 5.7 10*3/uL (ref 4.0–10.5)
nRBC: 0 % (ref 0.0–0.2)

## 2022-03-13 LAB — ECHOCARDIOGRAM COMPLETE
Area-P 1/2: 5.13 cm2
Calc EF: 34.1 %
Height: 70 in
MV M vel: 4.38 m/s
MV Peak grad: 76.7 mmHg
P 1/2 time: 525 msec
S' Lateral: 4 cm
Single Plane A2C EF: 35 %
Single Plane A4C EF: 35.3 %
Weight: 2656 oz

## 2022-03-13 LAB — BASIC METABOLIC PANEL
Anion gap: 7 (ref 5–15)
BUN: 12 mg/dL (ref 8–23)
CO2: 24 mmol/L (ref 22–32)
Calcium: 8.3 mg/dL — ABNORMAL LOW (ref 8.9–10.3)
Chloride: 110 mmol/L (ref 98–111)
Creatinine, Ser: 1.17 mg/dL (ref 0.61–1.24)
GFR, Estimated: 58 mL/min — ABNORMAL LOW (ref >60)
Glucose, Bld: 98 mg/dL (ref 70–99)
Potassium: 3.3 mmol/L — ABNORMAL LOW (ref 3.5–5.1)
Sodium: 141 mmol/L (ref 135–145)

## 2022-03-13 LAB — VITAMIN B12: Vitamin B-12: 247 pg/mL (ref 180–914)

## 2022-03-13 LAB — TSH: TSH: 3.511 u[IU]/mL (ref 0.350–4.500)

## 2022-03-13 LAB — T4, FREE: Free T4: 0.98 ng/dL (ref 0.61–1.12)

## 2022-03-13 MED ORDER — ALBUTEROL SULFATE (2.5 MG/3ML) 0.083% IN NEBU
2.5000 mg | INHALATION_SOLUTION | Freq: Two times a day (BID) | RESPIRATORY_TRACT | Status: DC
  Administered 2022-03-13 – 2022-03-16 (×7): 2.5 mg via RESPIRATORY_TRACT
  Filled 2022-03-13 (×7): qty 3

## 2022-03-13 MED ORDER — ALBUTEROL SULFATE (2.5 MG/3ML) 0.083% IN NEBU: 2.5000 mg | INHALATION_SOLUTION | RESPIRATORY_TRACT | Status: DC | PRN

## 2022-03-13 MED ORDER — ACETAMINOPHEN 325 MG PO TABS
650.0000 mg | ORAL_TABLET | Freq: Four times a day (QID) | ORAL | Status: DC | PRN
  Administered 2022-03-13 – 2022-03-16 (×4): 650 mg via ORAL
  Filled 2022-03-13 (×4): qty 2

## 2022-03-13 MED ORDER — FAMOTIDINE 20 MG PO TABS
20.0000 mg | ORAL_TABLET | Freq: Two times a day (BID) | ORAL | Status: DC
  Administered 2022-03-13 – 2022-03-17 (×9): 20 mg via ORAL
  Filled 2022-03-13 (×9): qty 1

## 2022-03-13 MED ORDER — HEPARIN SODIUM (PORCINE) 5000 UNIT/ML IJ SOLN
5000.0000 [IU] | Freq: Three times a day (TID) | INTRAMUSCULAR | Status: DC
  Administered 2022-03-13 – 2022-03-17 (×12): 5000 [IU] via SUBCUTANEOUS
  Filled 2022-03-13 (×13): qty 1

## 2022-03-13 MED ORDER — ALBUTEROL SULFATE (2.5 MG/3ML) 0.083% IN NEBU
2.5000 mg | INHALATION_SOLUTION | Freq: Three times a day (TID) | RESPIRATORY_TRACT | Status: DC
  Administered 2022-03-13: 2.5 mg via RESPIRATORY_TRACT
  Filled 2022-03-13: qty 3

## 2022-03-13 MED ORDER — FUROSEMIDE 10 MG/ML IJ SOLN
40.0000 mg | Freq: Two times a day (BID) | INTRAMUSCULAR | Status: DC
  Administered 2022-03-13 – 2022-03-14 (×3): 40 mg via INTRAVENOUS
  Filled 2022-03-13 (×3): qty 4

## 2022-03-13 MED ORDER — ACETAMINOPHEN 650 MG RE SUPP: 650.0000 mg | Freq: Four times a day (QID) | RECTAL | Status: DC | PRN

## 2022-03-13 MED ORDER — SODIUM CHLORIDE 0.9 % IV SOLN: 2.0000 g | INTRAVENOUS | Status: DC

## 2022-03-13 MED ORDER — LIVING BETTER WITH HEART FAILURE BOOK: Freq: Once | Status: AC

## 2022-03-13 MED ORDER — MAGNESIUM SULFATE 2 GM/50ML IV SOLN
2.0000 g | Freq: Once | INTRAVENOUS | Status: AC
  Administered 2022-03-13: 2 g via INTRAVENOUS
  Filled 2022-03-13: qty 50

## 2022-03-13 MED ORDER — POLYETHYLENE GLYCOL 3350 17 G PO PACK
17.0000 g | PACK | Freq: Every day | ORAL | Status: DC | PRN
  Administered 2022-03-17: 17 g via ORAL
  Filled 2022-03-13: qty 1

## 2022-03-13 MED ORDER — SODIUM CHLORIDE 0.9 % IV SOLN
100.0000 mg | Freq: Two times a day (BID) | INTRAVENOUS | Status: DC
  Administered 2022-03-13: 100 mg via INTRAVENOUS
  Filled 2022-03-13 (×4): qty 100

## 2022-03-13 NOTE — TOC Initial Note (Signed)
Transition of Care [facility]) - Initial/Assessment Note    Patient Details  Name: [name] MRN: [id number] Date of Birth: 08/20/1925  Transition of Care [facility]) CM/SW Contact:    Boneta Lucks, RN Phone Number: 03/13/2022, 2:06 PM  Clinical Narrative:     Patient admitted with acute hypoxic respiratory failure.. TOC consulted for CHF. Patient is agreeable to have Living better book for his daughter to use to help him. He is not interested in home health at this time. Book ordered.               Expected Discharge Plan: Home/Self Care Barriers to Discharge: Continued Medical Work up   Patient Goals and CMS Choice Patient states their goals for this hospitalization and ongoing recovery are:: to go home. CMS Medicare.gov Compare Post Acute Care list provided to:: Patient Choice offered to / list presented to : Patient    Expected Discharge Plan and Services    Prior Living Arrangements/Services    Activities of Daily Living Home Assistive Devices/Equipment: Built-in shower seat, Oxygen, Cane (specify quad or straight) ADL Screening (condition at time of admission) Patient's cognitive ability adequate to safely complete daily activities?: Yes Is the patient deaf or have difficulty hearing?: No Does the patient have difficulty seeing, even when wearing glasses/contacts?: Yes Does the patient have difficulty concentrating, remembering, or making decisions?: No Patient able to express need for assistance with ADLs?: Yes Does the patient have difficulty dressing or bathing?: No Independently performs ADLs?: No Communication: Independent Is this a change from baseline?: Pre-admission baseline Dressing (OT): Needs assistance Is this a change from baseline?: Pre-admission baseline Grooming: Needs assistance Is this a change from baseline?: Pre-admission baseline Feeding: Independent Is this a change from baseline?: Pre-admission baseline Bathing: Needs assistance Is this a  change from baseline?: Pre-admission baseline Toileting: Independent In/Out Bed: Needs assistance Is this a change from baseline?: Pre-admission baseline Walks in Home: Independent with device (comment) Is this a change from baseline?: Pre-admission baseline Does the patient have difficulty walking or climbing stairs?: Yes Weakness of Legs: Both Weakness of Arms/Hands: Both  Permission Sought/Granted      Emotional Assessment    Affect (typically observed): Accepting Orientation: : Oriented to Self, Oriented to Place, Oriented to  Time, Oriented to Situation Alcohol / Substance Use: Not Applicable Psych Involvement: No (comment)  Admission diagnosis:  Dyspnea [R06.00] Acute respiratory failure with hypoxia ([place]) [J96.01] Pneumonia of both lower lobes due to infectious organism [J18.9] Patient Active Problem List   Diagnosis Date Noted   Thrombocytopenia ([place]) 03/13/2022   Acute hypoxic respiratory failure ([place]) 03/12/2022   Acute congestive heart failure ([place]) 03/12/2022   LBBB (left bundle branch block) 03/12/2022   Stage 3b chronic kidney disease ([place]) 08/19/2021   Pedal edema 02/12/2020   DOE (dyspnea on exertion) 02/12/2020   Peripheral edema 01/29/2020   S/P total knee replacement, left 06/09/10 01/03/2018   Chest pain 09/24/2016   AAA (abdominal aortic aneurysm) ([place]) 09/24/2016   Cognitive dysfunction 12/27/2014   Hyperlipidemia 12/27/2014   Gout 01/11/2014   HTN (hypertension) 01/11/2014   Anemia, iron deficiency 04/03/2013   S/P total knee replacement, right 09/21/09 09/16/2010   GASTROESOPHAGEAL REFLUX DISEASE 05/08/2010   DIVERTICULOSIS, COLON 05/08/2010   TOBACCO ABUSE 05/08/2010   KNEE, ARTHRITIS, DEGEN./OSTEO 04/05/2008   PCP:  [name], MD Pharmacy:   [facility] [place] [address] [place] [address] Phone: [phone] Fax: [phone]    Social  Determinants of Health ([place]) Social  History: SDOH Screenings   Food Insecurity: No Food Insecurity (03/12/2022)  Housing: Low Risk  (03/12/2022)  Transportation Needs: No Transportation Needs (03/12/2022)  Utilities: Not At Risk (03/12/2022)  Alcohol Screen: Low Risk  (12/10/2020)  Depression (PHQ2-9): Low Risk  (02/02/2022)  Financial Resource Strain: Low Risk  (12/10/2020)  Physical Activity: Sufficiently Active (12/10/2020)  Social Connections: Moderately Integrated (12/10/2020)  Stress: No Stress Concern Present (12/10/2020)  Tobacco Use: Medium Risk (03/12/2022)   SDOH Interventions:  Readmission Risk Interventions    03/13/2022    2:05 PM  Readmission Risk Prevention Plan  Post Dischage Appt Not Complete  Medication Screening Complete  Transportation Screening Complete

## 2022-03-13 NOTE — Progress Notes (Signed)
*  PRELIMINARY RESULTS* Echocardiogram 2D Echocardiogram has been performed.  [name] 03/13/2022, 12:51 PM

## 2022-03-13 NOTE — Care Management Important Message (Signed)
Important Message  Patient Details  Name: [name] MRN: [id number] Date of Birth: 01/26/1926   Medicare Important Message Given:  Yes     Tommy Medal 03/13/2022, 4:20 PM

## 2022-03-13 NOTE — Progress Notes (Signed)
Patient Vs stable and he was able to rest most of the night. No complaints of SOB with oxygen at 2L

## 2022-03-13 NOTE — Progress Notes (Signed)
PROGRESS NOTE  [name] YQI:[id number] DOB: [birth year] DOA: 03/12/2022 PCP: [name], MD  Brief History49  88 year old male with a history of asthma, hypertension, gouty arthritis, and hyperlipidemia presenting with shortness of breath which she states has been ongoing for several weeks.  He states that his breathing has been worse since Christmas day.  He went to see his PCP on 03/12/2022.  EKG showed left bundle branch block.  In commendation with his shortness of breath, the patient was sent to the emergency department for further evaluation and treatment.  The patient states that his lower extremity edema is a little better than usual.  He denies any chest pain, fevers, chills, nausea, vomiting, diarrhea, abdominal pain.  There is no hemoptysis. In the ED, the patient was afebrile and hemodynamically stable with oxygen saturation 99% on 2 L.  He was 89% on room air.  WBC 8.1, hemoglobin 12.4, platelets 105,000.  Sodium 141, potassium 3.4, bicarbonate 21, serum creatinine 1.34.  BNP was 492.  COVID PCR was negative.  EKG shows sinus rhythm with IVCD.  There is nonspecific ST changes.  Chest x-ray showed bilateral pleural effusions.  The patient was given a dose of IV furosemide and admitted for further evaluation and treatment.  Assessment/Plan: Acute respiratory failure with hypoxia -Secondary to pleural effusions -Presented with tachypnea and oxygen saturation 89% RA -Stable on 2 L nasal cannula -Wean oxygen for saturation greater 92%  Acute CHF, type unspecified -Workup in progress -Echocardiogram -Continue IV furosemide -ReDS Vest reading -Accurate I's and O's -Daily weights  Elevated troponin -Demand ischemia -No chest pain -Personally reviewed EKG--sinus rhythm, IVCD, nonspecific ST change  Mixed hyperlipidemia -Continue statin  Hypokalemia/hypomagnesemia -Replete  Thrombocytopenia -TSH -L87 -Folic acid  CKD stage IIIb -Baseline creatinine  1.2-1.5 -Monitor with diuresis  Tobacco abuse, in remission           Family Communication:  no  Family at bedside  Consultants:  none  Code Status:  FULL   DVT Prophylaxis:  [place] Heparin    Procedures: As Listed in Progress Note Above  Antibiotics: Ceftriaxone 12/28 Doxy 12/28       Subjective: Patient denies fevers, chills, headache, chest pain, dyspnea, nausea, vomiting, diarrhea, abdominal pain, dysuria, hematuria, hematochezia, and melena.   Objective: Vitals:   03/12/22 2120 03/12/22 2144 03/13/22 0317 03/13/22 0731  BP:  (!) 149/96 127/85   Pulse: 94 97 81   Resp: 16  17   Temp: 97.9 F (36.6 C) 98.3 F (36.8 C) 98 F (36.7 C)   TempSrc: Oral Oral Oral   SpO2: 97% 97% 99% 99%  Weight:  75.3 kg    Height:  '5\' 10"'$  (1.778 m)      Intake/Output Summary (Last 24 hours) at 03/13/2022 5643 Last data filed at 03/13/2022 0615 Gross per 24 hour  Intake 600 ml  Output 500 ml  Net 100 ml   Weight change:  Exam:  General:  Pt is alert, follows commands appropriately, not in acute distress HEENT: No icterus, No thrush, No neck mass, [place]/AT Cardiovascular: RRR, S1/S2, no rubs, no gallops Respiratory: bibasilar rales. No wheeze Abdomen: Soft/+BS, non tender, non distended, no guarding Extremities: 1 + LE edema, No lymphangitis, No petechiae, No rashes, no synovitis   Data Reviewed: I have personally reviewed following labs and imaging studies Basic Metabolic Panel: Recent Labs  Lab 03/12/22 1550 03/12/22 1753  NA 141  --   K 3.4*  --  CL 109  --   CO2 21*  --   GLUCOSE 98  --   BUN 14  --   CREATININE 1.34*  --   CALCIUM 8.8*  --   MG  --  1.6*   Liver Function Tests: No results for input(s): "AST", "ALT", "ALKPHOS", "BILITOT", "PROT", "ALBUMIN" in the last 168 hours. No results for input(s): "LIPASE", "AMYLASE" in the last 168 hours. No results for input(s): "AMMONIA" in the last 168 hours. Coagulation Profile: No results for  input(s): "INR", "PROTIME" in the last 168 hours. CBC: Recent Labs  Lab 03/12/22 1550 03/13/22 0442  WBC 8.1 5.7  HGB 12.4* 11.1*  HCT 39.9 35.9*  MCV 102.6* 101.1*  PLT 105* 94*   Cardiac Enzymes: No results for input(s): "CKTOTAL", "CKMB", "CKMBINDEX", "TROPONINI" in the last 168 hours. BNP: Invalid input(s): "POCBNP" CBG: No results for input(s): "GLUCAP" in the last 168 hours. HbA1C: No results for input(s): "HGBA1C" in the last 72 hours. Urine analysis:    Component Value Date/Time   COLORURINE STRAW (A) 11/15/2019 2016   APPEARANCEUR CLEAR 11/15/2019 2016   LABSPEC 1.006 11/15/2019 2016   PHURINE 5.0 11/15/2019 2016   GLUCOSEU NEGATIVE 11/15/2019 2016   HGBUR LARGE (A) 11/15/2019 2016   BILIRUBINUR NEGATIVE 11/15/2019 2016   KETONESUR NEGATIVE 11/15/2019 2016   PROTEINUR Positive (A) 12/03/2020 1101   PROTEINUR NEGATIVE 11/15/2019 2016   NITRITE NEGATIVE 11/15/2019 2016   LEUKOCYTESUR NEGATIVE 11/15/2019 2016   Sepsis Labs: '@LABRCNTIP'$ (procalcitonin:4,lacticidven:4) ) Recent Results (from the past 240 hour(s))  Resp panel by RT-PCR (RSV, Flu A&B, Covid) Anterior Nasal Swab     Status: None   Collection Time: 03/12/22  5:30 PM   Specimen: Anterior Nasal Swab  Result Value Ref Range Status   SARS Coronavirus 2 by RT PCR NEGATIVE NEGATIVE Final    Comment: (NOTE) SARS-CoV-2 target nucleic acids are NOT DETECTED.  The SARS-CoV-2 RNA is generally detectable in upper respiratory specimens during the acute phase of infection. The lowest concentration of SARS-CoV-2 viral copies this assay can detect is 138 copies/mL. A negative result does not preclude SARS-Cov-2 infection and should not be used as the sole basis for treatment or other patient management decisions. A negative result may occur with  improper specimen collection/handling, submission of specimen other than nasopharyngeal swab, presence of viral mutation(s) within the areas targeted by this assay, and  inadequate number of viral copies(<138 copies/mL). A negative result must be combined with clinical observations, patient history, and epidemiological information. The expected result is Negative.  Fact Sheet for Patients:  EntrepreneurPulse.com.au  Fact Sheet for Healthcare Providers:  IncredibleEmployment.be  This test is no t yet approved or cleared by the [place] FDA and  has been authorized for detection and/or diagnosis of SARS-CoV-2 by FDA under an Emergency Use Authorization (EUA). This EUA will remain  in effect (meaning this test can be used) for the duration of the COVID-19 declaration under Section 564(b)(1) of the Act, 21 U.S.C.section 360bbb-3(b)(1), unless the authorization is terminated  or revoked sooner.       Influenza A by PCR NEGATIVE NEGATIVE Final   Influenza B by PCR NEGATIVE NEGATIVE Final    Comment: (NOTE) The Xpert Xpress SARS-CoV-2/FLU/RSV plus assay is intended as an aid in the diagnosis of influenza from Nasopharyngeal swab specimens and should not be used as a sole basis for treatment. Nasal washings and aspirates are unacceptable for Xpert Xpress SARS-CoV-2/FLU/RSV testing.  Fact Sheet for Patients: EntrepreneurPulse.com.au  Fact Sheet  for Healthcare Providers: IncredibleEmployment.be  This test is not yet approved or cleared by the [place] and has been authorized for detection and/or diagnosis of SARS-CoV-2 by FDA under an Emergency Use Authorization (EUA). This EUA will remain in effect (meaning this test can be used) for the duration of the COVID-19 declaration under Section 564(b)(1) of the Act, 21 U.S.C. section 360bbb-3(b)(1), unless the authorization is terminated or revoked.     Resp Syncytial Virus by PCR NEGATIVE NEGATIVE Final    Comment: (NOTE) Fact Sheet for Patients: EntrepreneurPulse.com.au  Fact Sheet for Healthcare  Providers: IncredibleEmployment.be  This test is not yet approved or cleared by the [place] FDA and has been authorized for detection and/or diagnosis of SARS-CoV-2 by FDA under an Emergency Use Authorization (EUA). This EUA will remain in effect (meaning this test can be used) for the duration of the COVID-19 declaration under Section 564(b)(1) of the Act, 21 U.S.C. section 360bbb-3(b)(1), unless the authorization is terminated or revoked.  Performed at [facility], [address], [place], [place] [address]      Scheduled Meds:  albuterol  2.5 mg Nebulization Q8H   famotidine  20 mg Oral BID   furosemide  40 mg Intravenous Q12H   heparin  5,000 Units Subcutaneous Q8H   Continuous Infusions:  cefTRIAXone (ROCEPHIN)  IV     doxycycline (VIBRAMYCIN) IV 100 mg (03/13/22 0615)    Procedures/Studies: CT Angio Chest Pulmonary Embolism (PE) W or WO Contrast  Result Date: 03/12/2022 CLINICAL DATA:  Acute respiratory failure and hypoxia. EXAM: CT ANGIOGRAPHY CHEST WITH CONTRAST TECHNIQUE: Multidetector CT imaging of the chest was performed using the standard protocol during bolus administration of intravenous contrast. Multiplanar CT image reconstructions and MIPs were obtained to evaluate the vascular anatomy. RADIATION DOSE REDUCTION: This exam was performed according to the departmental dose-optimization program which includes automated exposure control, adjustment of the mA and/or kV according to patient size and/or use of iterative reconstruction technique. CONTRAST:  66m OMNIPAQUE IOHEXOL 350 MG/ML SOLN COMPARISON:  Chest radiograph dated 03/12/2022 and CT dated 09/18/2021. FINDINGS: Evaluation of this exam is limited due to respiratory motion artifact. Cardiovascular: Mild cardiomegaly. Coronary vascular calcification of the LAD. No pericardial effusion. There is mild atherosclerotic calcification of the thoracic aorta. No aneurysmal dilatation. No pulmonary  artery embolus identified. Mediastinum/Nodes: No hilar or mediastinal adenopathy. The esophagus is grossly unremarkable. No mediastinal fluid collection. Lungs/Pleura: Moderate bilateral pleural effusions with partial compressive atelectasis of the lower lobes versus pneumonia. There is diffuse interstitial and interlobular septal prominence consistent with edema. There is background of emphysema. No pneumothorax. The central airways are patent. Upper Abdomen: No acute abnormality. Musculoskeletal: Osteopenia with degenerative changes of the spine. No acute osseous pathology. Review of the MIP images confirms the above findings. IMPRESSION: 1. No CT evidence of pulmonary artery embolus. 2. Moderate bilateral pleural effusions with partial compressive atelectasis of the lower lobes versus pneumonia. 3. Cardiomegaly with coronary vascular calcification of the LAD. 4.  Aortic Atherosclerosis (ICD10-I70.0). Electronically Signed   By: AAnner CreteM.D.   On: 03/12/2022 20:10   DG Chest 2 View  Result Date: 03/12/2022 CLINICAL DATA:  Shortness of breath with exertion over the past couple of weeks. EXAM: CHEST - 2 VIEW COMPARISON:  09/18/2021 FINDINGS: Lungs are hyperinflated. There are new bilateral pleural effusions. There is significant bibasilar opacity obscuring the hemidiaphragms, consistent with atelectasis or infiltrates. No evidence for pulmonary edema. IMPRESSION: New bilateral pleural effusions and bibasilar atelectasis or infiltrates. Electronically  Signed   By: Nolon Nations M.D.   On: 03/12/2022 16:00    Orson Eva, DO  [facility]  If 7PM-7AM, please contact night-coverage [email or web address] Password [email or web address] 03/13/2022, 8:21 AM   LOS: 1 day

## 2022-03-14 DIAGNOSIS — D696 Thrombocytopenia, unspecified: Secondary | ICD-10-CM | POA: Diagnosis not present

## 2022-03-14 DIAGNOSIS — N1832 Chronic kidney disease, stage 3b: Secondary | ICD-10-CM | POA: Diagnosis not present

## 2022-03-14 DIAGNOSIS — I5021 Acute systolic (congestive) heart failure: Secondary | ICD-10-CM | POA: Diagnosis not present

## 2022-03-14 DIAGNOSIS — J9601 Acute respiratory failure with hypoxia: Secondary | ICD-10-CM | POA: Diagnosis not present

## 2022-03-14 LAB — BASIC METABOLIC PANEL
Anion gap: 8 (ref 5–15)
BUN: 13 mg/dL (ref 8–23)
CO2: 26 mmol/L (ref 22–32)
Calcium: 8.8 mg/dL — ABNORMAL LOW (ref 8.9–10.3)
Chloride: 107 mmol/L (ref 98–111)
Creatinine, Ser: 1.38 mg/dL — ABNORMAL HIGH (ref 0.61–1.24)
GFR, Estimated: 47 mL/min — ABNORMAL LOW (ref >60)
Glucose, Bld: 100 mg/dL — ABNORMAL HIGH (ref 70–99)
Potassium: 3.2 mmol/L — ABNORMAL LOW (ref 3.5–5.1)
Sodium: 141 mmol/L (ref 135–145)

## 2022-03-14 LAB — MAGNESIUM: Magnesium: 1.7 mg/dL (ref 1.7–2.4)

## 2022-03-14 MED ORDER — POTASSIUM CHLORIDE CRYS ER 20 MEQ PO TBCR
40.0000 meq | EXTENDED_RELEASE_TABLET | Freq: Once | ORAL | Status: AC
  Administered 2022-03-14: 40 meq via ORAL
  Filled 2022-03-14: qty 2

## 2022-03-14 MED ORDER — MAGNESIUM SULFATE 2 GM/50ML IV SOLN
2.0000 g | Freq: Once | INTRAVENOUS | Status: AC
  Administered 2022-03-14: 2 g via INTRAVENOUS
  Filled 2022-03-14: qty 50

## 2022-03-14 MED ORDER — SPIRONOLACTONE 12.5 MG HALF TABLET
12.5000 mg | ORAL_TABLET | Freq: Every day | ORAL | Status: DC
  Administered 2022-03-14 – 2022-03-17 (×4): 12.5 mg via ORAL
  Filled 2022-03-14 (×4): qty 1

## 2022-03-14 MED ORDER — FUROSEMIDE 10 MG/ML IJ SOLN: 40.0000 mg | Freq: Every day | INTRAMUSCULAR | Status: DC

## 2022-03-14 MED ORDER — CARVEDILOL 3.125 MG PO TABS
3.1250 mg | ORAL_TABLET | Freq: Two times a day (BID) | ORAL | Status: DC
  Administered 2022-03-14: 3.125 mg via ORAL
  Filled 2022-03-14 (×2): qty 1

## 2022-03-14 NOTE — Progress Notes (Signed)
PROGRESS NOTE  [name] MPN:[id number] DOB: 21-Oct-1925 DOA: 03/12/2022 PCP: [name], MD  Brief History57  88 year old male with a history of asthma, hypertension, gouty arthritis, and hyperlipidemia presenting with shortness of breath which she states has been ongoing for several weeks.  He states that his breathing has been worse since Christmas day.  He went to see his PCP on 03/12/2022.  EKG showed left bundle branch block.  In commendation with his shortness of breath, the patient was sent to the emergency department for further evaluation and treatment.  The patient states that his lower extremity edema is a little better than usual.  He denies any chest pain, fevers, chills, nausea, vomiting, diarrhea, abdominal pain.  There is no hemoptysis. In the ED, the patient was afebrile and hemodynamically stable with oxygen saturation 99% on 2 L.  He was 89% on room air.  WBC 8.1, hemoglobin 12.4, platelets 105,000.  Sodium 141, potassium 3.4, bicarbonate 21, serum creatinine 1.34.  BNP was 492.  COVID PCR was negative.  EKG shows sinus rhythm with IVCD.  There is nonspecific ST changes.  Chest x-ray showed bilateral pleural effusions.  The patient was given a dose of IV furosemide and admitted for further evaluation and treatment.  Assessment/Plan: Acute respiratory failure with hypoxia -Secondary to pleural effusions -Presented with tachypnea and oxygen saturation 89% RA -Stable on 2 L nasal cannula -Wean oxygen for saturation greater 92%   Acute HFrEF -12/29 Echo--EF 30-35%, global HK -remains clinically fluid overloaded -Continue IV furosemide -ReDS Vest reading -Accurate I's and O's--incomplete -Daily weights--NEG 6 lb -start coreg -start spironolactone   Elevated troponin -Demand ischemia -No chest pain -Personally reviewed EKG--sinus rhythm, IVCD, nonspecific ST change   Mixed hyperlipidemia -Continue statin   Hypokalemia/hypomagnesemia -Repleted    Thrombocytopenia -MGQ--6.761 -P50 932 -Folic acid   CKD stage IIIb -Baseline creatinine 1.2-1.5 -Monitor with diuresis   Tobacco abuse, in remission                     Family Communication:  daughter updated at bedside 12/30   Consultants:  none   Code Status:  FULL    DVT Prophylaxis:  [place] Heparin      Procedures: As Listed in Progress Note Above   Antibiotics: Ceftriaxone 12/28 Doxy 12/28         Subjective: Patient denies fevers, chills, headache, chest pain, dyspnea, nausea, vomiting, diarrhea, abdominal pain, dysuria, hematuria, hematochezia, and melena.   Objective: Vitals:   03/13/22 2003 03/14/22 0435 03/14/22 0500 03/14/22 0752  BP: 130/85 (!) 127/92    Pulse: 76 77    Resp: 18 18    Temp: 98.4 F (36.9 C) 98.4 F (36.9 C)    TempSrc: Oral     SpO2: 100% 97%  98%  Weight:   74.3 kg   Height:   '5\' 10"'$  (1.778 m)     Intake/Output Summary (Last 24 hours) at 03/14/2022 1231 Last data filed at 03/14/2022 0726 Gross per 24 hour  Intake 480 ml  Output 2600 ml  Net -2120 ml   Weight change: -2.7 kg Exam:  General:  Pt is alert, follows commands appropriately, not in acute distress HEENT: No icterus, No thrush, No neck mass, [place]/AT Cardiovascular: RRR, S1/S2, no rubs, no gallops +JVD Respiratory: bibasilar crackles.  No wheeze Abdomen: Soft/+BS, non tender, non distended, no guarding Extremities: trace LE edema, No lymphangitis, No petechiae, No rashes, no  synovitis   Data Reviewed: I have personally reviewed following labs and imaging studies Basic Metabolic Panel: Recent Labs  Lab 03/12/22 1550 03/12/22 1753 03/13/22 0442 03/14/22 0526  NA 141  --  141 141  K 3.4*  --  3.3* 3.2*  CL 109  --  110 107  CO2 21*  --  24 26  GLUCOSE 98  --  98 100*  BUN 14  --  12 13  CREATININE 1.34*  --  1.17 1.38*  CALCIUM 8.8*  --  8.3* 8.8*  MG  --  1.6*  --  1.7   Liver Function Tests: No results for input(s): "AST", "ALT",  "ALKPHOS", "BILITOT", "PROT", "ALBUMIN" in the last 168 hours. No results for input(s): "LIPASE", "AMYLASE" in the last 168 hours. No results for input(s): "AMMONIA" in the last 168 hours. Coagulation Profile: No results for input(s): "INR", "PROTIME" in the last 168 hours. CBC: Recent Labs  Lab 03/12/22 1550 03/13/22 0442  WBC 8.1 5.7  HGB 12.4* 11.1*  HCT 39.9 35.9*  MCV 102.6* 101.1*  PLT 105* 94*   Cardiac Enzymes: No results for input(s): "CKTOTAL", "CKMB", "CKMBINDEX", "TROPONINI" in the last 168 hours. BNP: Invalid input(s): "POCBNP" CBG: No results for input(s): "GLUCAP" in the last 168 hours. HbA1C: No results for input(s): "HGBA1C" in the last 72 hours. Urine analysis:    Component Value Date/Time   COLORURINE STRAW (A) 11/15/2019 2016   APPEARANCEUR CLEAR 11/15/2019 2016   LABSPEC 1.006 11/15/2019 2016   PHURINE 5.0 11/15/2019 2016   GLUCOSEU NEGATIVE 11/15/2019 2016   HGBUR LARGE (A) 11/15/2019 2016   BILIRUBINUR NEGATIVE 11/15/2019 2016   KETONESUR NEGATIVE 11/15/2019 2016   PROTEINUR Positive (A) 12/03/2020 1101   PROTEINUR NEGATIVE 11/15/2019 2016   NITRITE NEGATIVE 11/15/2019 2016   LEUKOCYTESUR NEGATIVE 11/15/2019 2016   Sepsis Labs: '@LABRCNTIP'$ (procalcitonin:4,lacticidven:4) ) Recent Results (from the past 240 hour(s))  Resp panel by RT-PCR (RSV, Flu A&B, Covid) Anterior Nasal Swab     Status: None   Collection Time: 03/12/22  5:30 PM   Specimen: Anterior Nasal Swab  Result Value Ref Range Status   SARS Coronavirus 2 by RT PCR NEGATIVE NEGATIVE Final    Comment: (NOTE) SARS-CoV-2 target nucleic acids are NOT DETECTED.  The SARS-CoV-2 RNA is generally detectable in upper respiratory specimens during the acute phase of infection. The lowest concentration of SARS-CoV-2 viral copies this assay can detect is 138 copies/mL. A negative result does not preclude SARS-Cov-2 infection and should not be used as the sole basis for treatment or other  patient management decisions. A negative result may occur with  improper specimen collection/handling, submission of specimen other than nasopharyngeal swab, presence of viral mutation(s) within the areas targeted by this assay, and inadequate number of viral copies(<138 copies/mL). A negative result must be combined with clinical observations, patient history, and epidemiological information. The expected result is Negative.  Fact Sheet for Patients:  EntrepreneurPulse.com.au  Fact Sheet for Healthcare Providers:  IncredibleEmployment.be  This test is no t yet approved or cleared by the [place] FDA and  has been authorized for detection and/or diagnosis of SARS-CoV-2 by FDA under an Emergency Use Authorization (EUA). This EUA will remain  in effect (meaning this test can be used) for the duration of the COVID-19 declaration under Section 564(b)(1) of the Act, 21 U.S.C.section 360bbb-3(b)(1), unless the authorization is terminated  or revoked sooner.       Influenza A by PCR NEGATIVE NEGATIVE Final   Influenza  B by PCR NEGATIVE NEGATIVE Final    Comment: (NOTE) The Xpert Xpress SARS-CoV-2/FLU/RSV plus assay is intended as an aid in the diagnosis of influenza from Nasopharyngeal swab specimens and should not be used as a sole basis for treatment. Nasal washings and aspirates are unacceptable for Xpert Xpress SARS-CoV-2/FLU/RSV testing.  Fact Sheet for Patients: EntrepreneurPulse.com.au  Fact Sheet for Healthcare Providers: IncredibleEmployment.be  This test is not yet approved or cleared by the [place] FDA and has been authorized for detection and/or diagnosis of SARS-CoV-2 by FDA under an Emergency Use Authorization (EUA). This EUA will remain in effect (meaning this test can be used) for the duration of the COVID-19 declaration under Section 564(b)(1) of the Act, 21 U.S.C. section  360bbb-3(b)(1), unless the authorization is terminated or revoked.     Resp Syncytial Virus by PCR NEGATIVE NEGATIVE Final    Comment: (NOTE) Fact Sheet for Patients: EntrepreneurPulse.com.au  Fact Sheet for Healthcare Providers: IncredibleEmployment.be  This test is not yet approved or cleared by the [place] FDA and has been authorized for detection and/or diagnosis of SARS-CoV-2 by FDA under an Emergency Use Authorization (EUA). This EUA will remain in effect (meaning this test can be used) for the duration of the COVID-19 declaration under Section 564(b)(1) of the Act, 21 U.S.C. section 360bbb-3(b)(1), unless the authorization is terminated or revoked.  Performed at [facility], [address], [place], [place] [address]      Scheduled Meds:  albuterol  2.5 mg Nebulization BID   carvedilol  3.125 mg Oral BID WC   famotidine  20 mg Oral BID   furosemide  40 mg Intravenous Q12H   heparin  5,000 Units Subcutaneous Q8H   potassium chloride  40 mEq Oral Once   spironolactone  12.5 mg Oral Daily   Continuous Infusions:  magnesium sulfate bolus IVPB      Procedures/Studies: ECHOCARDIOGRAM COMPLETE  Result Date: 03/13/2022    ECHOCARDIOGRAM REPORT   Patient Name:   [name] Date of Exam: 03/13/2022 Medical Rec #:  [id number]           Height:       70.0 in Accession #:    [id number]          Weight:       166.0 lb Date of Birth:  16-Dec-1925           BSA:          1.928 m Patient Age:    25 years            BP:           127/85 mmHg Patient Gender: M                   HR:           81 bpm. Exam Location:  [facility] Procedure: 2D Echo, Cardiac Doppler and Color Doppler Indications:    Congestive Heart Failure I50.9  History:        Patient has no prior history of Echocardiogram examinations.                 CHF, Arrythmias:LBBB; Risk Factors:Hypertension and                 Dyslipidemia. Stage 3b chronic kidney disease ([place]),  TOBACCO                 ABUSE.  Sonographer:    Alvino Chapel RCS Referring Phys: [phone] Tami Ribas  E EMOKPAE IMPRESSIONS  1. Left ventricular ejection fraction, by estimation, is 30 to 35%. The left ventricle has moderately decreased function. The left ventricle demonstrates global hypokinesis. The left ventricular internal cavity size was moderately dilated. Left ventricular diastolic parameters were normal.  2. Right ventricular systolic function is mildly reduced. The right ventricular size is mildly enlarged. There is mildly elevated pulmonary artery systolic pressure.  3. Left atrial size was mildly dilated.  4. Right atrial size was mildly dilated.  5. The mitral valve is normal in structure. Mild mitral valve regurgitation. No evidence of mitral stenosis.  6. Tricuspid valve regurgitation is moderate.  7. The aortic valve is tricuspid. There is mild calcification of the aortic valve. Aortic valve regurgitation is mild. Aortic valve sclerosis is present, with no evidence of aortic valve stenosis.  8. The inferior vena cava is dilated in size with <50% respiratory variability, suggesting right atrial pressure of 15 mmHg. FINDINGS  Left Ventricle: Left ventricular ejection fraction, by estimation, is 30 to 35%. The left ventricle has moderately decreased function. The left ventricle demonstrates global hypokinesis. The left ventricular internal cavity size was moderately dilated. There is no left ventricular hypertrophy. Left ventricular diastolic parameters were normal. Right Ventricle: The right ventricular size is mildly enlarged. No increase in right ventricular wall thickness. Right ventricular systolic function is mildly reduced. There is mildly elevated pulmonary artery systolic pressure. The tricuspid regurgitant  velocity is 3.03 m/s, and with an assumed right atrial pressure of 3 mmHg, the estimated right ventricular systolic pressure is 63.8 mmHg. Left Atrium: Left atrial size was mildly dilated. Right  Atrium: Right atrial size was mildly dilated. Pericardium: Trivial pericardial effusion is present. The pericardial effusion is localized near the right atrium and anterior to the right ventricle. Mitral Valve: The mitral valve is normal in structure. Mild mitral valve regurgitation. No evidence of mitral valve stenosis. Tricuspid Valve: The tricuspid valve is normal in structure. Tricuspid valve regurgitation is moderate . No evidence of tricuspid stenosis. Aortic Valve: The aortic valve is tricuspid. There is mild calcification of the aortic valve. Aortic valve regurgitation is mild. Aortic regurgitation PHT measures 525 msec. Aortic valve sclerosis is present, with no evidence of aortic valve stenosis. Pulmonic Valve: The pulmonic valve was normal in structure. Pulmonic valve regurgitation is not visualized. No evidence of pulmonic stenosis. Aorta: The aortic root is normal in size and structure. Venous: The inferior vena cava is dilated in size with less than 50% respiratory variability, suggesting right atrial pressure of 15 mmHg. IAS/Shunts: No atrial level shunt detected by color flow Doppler.  LEFT VENTRICLE PLAX 2D LVIDd:         5.50 cm      Diastology LVIDs:         4.00 cm      LV e' medial:    4.35 cm/s LV PW:         1.00 cm      LV E/e' medial:  25.3 LV IVS:        1.10 cm      LV e' lateral:   8.05 cm/s LVOT diam:     2.00 cm      LV E/e' lateral: 13.7 LV SV:         52 LV SV Index:   27 LVOT Area:     3.14 cm  LV Volumes (MOD) LV vol d, MOD A2C: 115.0 ml LV vol d, MOD A4C: 119.0 ml LV vol s, MOD A2C:  74.7 ml LV vol s, MOD A4C: 77.0 ml LV SV MOD A2C:     40.3 ml LV SV MOD A4C:     119.0 ml LV SV MOD BP:      39.8 ml RIGHT VENTRICLE RV S prime:     10.70 cm/s TAPSE (M-mode): 2.4 cm LEFT ATRIUM              Index        RIGHT ATRIUM           Index LA diam:        3.90 cm  2.02 cm/m   RA Area:     20.60 cm LA Vol (A2C):   103.0 ml 53.41 ml/m  RA Volume:   59.30 ml  30.75 ml/m LA Vol (A4C):   90.6  ml  46.96 ml/m LA Biplane Vol: 104.0 ml 53.93 ml/m  AORTIC VALVE LVOT Vmax:   90.20 cm/s LVOT Vmean:  55.900 cm/s LVOT VTI:    0.164 m AI PHT:      525 msec  AORTA Ao Root diam: 3.70 cm MITRAL VALVE                TRICUSPID VALVE MV Area (PHT): 5.13 cm     TR Peak grad:   36.7 mmHg MV Decel Time: 148 msec     TR Vmax:        303.00 cm/s MR Peak grad: 76.7 mmHg MR Mean grad: 45.0 mmHg     SHUNTS MR Vmax:      438.00 cm/s   Systemic VTI:  0.16 m MR Vmean:     313.0 cm/s    Systemic Diam: 2.00 cm MV E velocity: 110.00 cm/s MV A velocity: 105.00 cm/s MV E/A ratio:  1.05 Jenkins Rouge MD Electronically signed by Jenkins Rouge MD Signature Date/Time: 03/13/2022/8:39:31 PM    Final    CT Angio Chest Pulmonary Embolism (PE) W or WO Contrast  Result Date: 03/12/2022 CLINICAL DATA:  Acute respiratory failure and hypoxia. EXAM: CT ANGIOGRAPHY CHEST WITH CONTRAST TECHNIQUE: Multidetector CT imaging of the chest was performed using the standard protocol during bolus administration of intravenous contrast. Multiplanar CT image reconstructions and MIPs were obtained to evaluate the vascular anatomy. RADIATION DOSE REDUCTION: This exam was performed according to the departmental dose-optimization program which includes automated exposure control, adjustment of the mA and/or kV according to patient size and/or use of iterative reconstruction technique. CONTRAST:  41m OMNIPAQUE IOHEXOL 350 MG/ML SOLN COMPARISON:  Chest radiograph dated 03/12/2022 and CT dated 09/18/2021. FINDINGS: Evaluation of this exam is limited due to respiratory motion artifact. Cardiovascular: Mild cardiomegaly. Coronary vascular calcification of the LAD. No pericardial effusion. There is mild atherosclerotic calcification of the thoracic aorta. No aneurysmal dilatation. No pulmonary artery embolus identified. Mediastinum/Nodes: No hilar or mediastinal adenopathy. The esophagus is grossly unremarkable. No mediastinal fluid collection. Lungs/Pleura:  Moderate bilateral pleural effusions with partial compressive atelectasis of the lower lobes versus pneumonia. There is diffuse interstitial and interlobular septal prominence consistent with edema. There is background of emphysema. No pneumothorax. The central airways are patent. Upper Abdomen: No acute abnormality. Musculoskeletal: Osteopenia with degenerative changes of the spine. No acute osseous pathology. Review of the MIP images confirms the above findings. IMPRESSION: 1. No CT evidence of pulmonary artery embolus. 2. Moderate bilateral pleural effusions with partial compressive atelectasis of the lower lobes versus pneumonia. 3. Cardiomegaly with coronary vascular calcification of the LAD. 4.  Aortic Atherosclerosis (ICD10-I70.0). Electronically Signed  By: Anner Crete M.D.   On: 03/12/2022 20:10   DG Chest 2 View  Result Date: 03/12/2022 CLINICAL DATA:  Shortness of breath with exertion over the past couple of weeks. EXAM: CHEST - 2 VIEW COMPARISON:  09/18/2021 FINDINGS: Lungs are hyperinflated. There are new bilateral pleural effusions. There is significant bibasilar opacity obscuring the hemidiaphragms, consistent with atelectasis or infiltrates. No evidence for pulmonary edema. IMPRESSION: New bilateral pleural effusions and bibasilar atelectasis or infiltrates. Electronically Signed   By: Nolon Nations M.D.   On: 03/12/2022 16:00    Orson Eva, DO  [facility]  If 7PM-7AM, please contact night-coverage [email or web address] Password [email or web address] 03/14/2022, 12:31 PM   LOS: 2 days

## 2022-03-15 ENCOUNTER — Other Ambulatory Visit: Payer: Self-pay

## 2022-03-15 DIAGNOSIS — I447 Left bundle-branch block, unspecified: Secondary | ICD-10-CM | POA: Diagnosis not present

## 2022-03-15 DIAGNOSIS — N1832 Chronic kidney disease, stage 3b: Secondary | ICD-10-CM | POA: Diagnosis not present

## 2022-03-15 DIAGNOSIS — I5021 Acute systolic (congestive) heart failure: Secondary | ICD-10-CM | POA: Diagnosis not present

## 2022-03-15 DIAGNOSIS — J9601 Acute respiratory failure with hypoxia: Secondary | ICD-10-CM | POA: Diagnosis not present

## 2022-03-15 LAB — BASIC METABOLIC PANEL WITH GFR
Anion gap: 5 (ref 5–15)
BUN: 16 mg/dL (ref 8–23)
CO2: 31 mmol/L (ref 22–32)
Calcium: 8.6 mg/dL — ABNORMAL LOW (ref 8.9–10.3)
Chloride: 104 mmol/L (ref 98–111)
Creatinine, Ser: 1.4 mg/dL — ABNORMAL HIGH (ref 0.61–1.24)
GFR, Estimated: 47 mL/min — ABNORMAL LOW
Glucose, Bld: 98 mg/dL (ref 70–99)
Potassium: 3.4 mmol/L — ABNORMAL LOW (ref 3.5–5.1)
Sodium: 140 mmol/L (ref 135–145)

## 2022-03-15 LAB — FOLATE: Folate: 9.3 ng/mL (ref >5.9)

## 2022-03-15 LAB — TROPONIN I (HIGH SENSITIVITY)
Troponin I (High Sensitivity): 17 ng/L (ref <18)
Troponin I (High Sensitivity): 17 ng/L (ref <18)

## 2022-03-15 LAB — MAGNESIUM: Magnesium: 2 mg/dL (ref 1.7–2.4)

## 2022-03-15 MED ORDER — LOSARTAN POTASSIUM 50 MG PO TABS
25.0000 mg | ORAL_TABLET | Freq: Every day | ORAL | Status: DC
  Administered 2022-03-15 – 2022-03-17 (×3): 25 mg via ORAL
  Filled 2022-03-15 (×3): qty 1

## 2022-03-15 MED ORDER — FUROSEMIDE 40 MG PO TABS
40.0000 mg | ORAL_TABLET | Freq: Every day | ORAL | Status: DC
  Administered 2022-03-15 – 2022-03-17 (×3): 40 mg via ORAL
  Filled 2022-03-15 (×3): qty 1

## 2022-03-15 MED ORDER — ASPIRIN 81 MG PO CHEW
81.0000 mg | CHEWABLE_TABLET | Freq: Every day | ORAL | Status: DC
  Administered 2022-03-15 – 2022-03-17 (×3): 81 mg via ORAL
  Filled 2022-03-15 (×3): qty 1

## 2022-03-15 MED ORDER — POTASSIUM CHLORIDE CRYS ER 20 MEQ PO TBCR
40.0000 meq | EXTENDED_RELEASE_TABLET | Freq: Once | ORAL | Status: AC
  Administered 2022-03-15: 40 meq via ORAL
  Filled 2022-03-15: qty 2

## 2022-03-15 MED ORDER — POTASSIUM CHLORIDE CRYS ER 20 MEQ PO TBCR: 20.0000 meq | EXTENDED_RELEASE_TABLET | Freq: Once | ORAL | Status: DC

## 2022-03-15 MED ORDER — ROSUVASTATIN CALCIUM 20 MG PO TABS
20.0000 mg | ORAL_TABLET | Freq: Every day | ORAL | Status: DC
  Administered 2022-03-15 – 2022-03-17 (×3): 20 mg via ORAL
  Filled 2022-03-15 (×3): qty 1

## 2022-03-15 NOTE — Progress Notes (Signed)
PROGRESS NOTE  [name]:[id number] DOB: 1925-10-25 DOA: 03/12/2022 PCP: [name], MD  Brief History108  88 year old male with a history of asthma, hypertension, gouty arthritis, and hyperlipidemia presenting with shortness of breath which she states has been ongoing for several weeks.  He states that his breathing has been worse since Christmas day.  He went to see his PCP on 03/12/2022.  EKG showed left bundle branch block.  In commendation with his shortness of breath, the patient was sent to the emergency department for further evaluation and treatment.  The patient states that his lower extremity edema is a little better than usual.  He denies any chest pain, fevers, chills, nausea, vomiting, diarrhea, abdominal pain.  There is no hemoptysis. In the ED, the patient was afebrile and hemodynamically stable with oxygen saturation 99% on 2 L.  He was 89% on room air.  WBC 8.1, hemoglobin 12.4, platelets 105,000.  Sodium 141, potassium 3.4, bicarbonate 21, serum creatinine 1.34.  BNP was 492.  COVID PCR was negative.  EKG shows sinus rhythm with IVCD.  There is nonspecific ST changes.  Chest x-ray showed bilateral pleural effusions.  The patient was given a dose of IV furosemide and admitted for further evaluation and treatment.  During the night 12/30-12/31, the patient developed bradycardia in the 40s while sleeping.  He was asymptomatic.  Upon awakening, pt was completely lucid and denied any cp, sob, dizziness.  He remained hemodynamically stable.  Coreg was discontinued.   Assessment/Plan: Acute respiratory failure with hypoxia -Secondary to pleural effusions -Presented with tachypnea and oxygen saturation 89% RA -Stable on 2 L nasal cannula -Wean oxygen for saturation greater 92% -weaned to RA on 03/14/22 -obtain ReDS vest reading   Acute HFrEF -12/29 Echo--EF 30-35%, global HK -Continue IV furosemide>>transition to po lasix 12/31 -ReDS Vest  reading -Accurate I's and O's--incomplete -Daily weights--NEG 6 lb -d/c coreg due to bradycardia -start spironolactone  Sinus Bradycardia -reviewed telemetry from evening 12/30-12/31>>sinus brady with PACs and PVCs--NO afib -d/c coreg -upon my eval of patient in AM 12/31--HR slowly improving>>70s while awake -personally reviewed EKG--sinus, TWI V2-V4 -TSH 3.511 -pt completely asymptomatic, no cp, no sob, no dizziness -VS at 0700--HR 71--RR14--118/63--97%RA   Elevated troponin -Demand ischemia -83>>85 at time of admission -No chest pain -Personally reviewed EKG--sinus rhythm, IVCD, nonspecific ST change -start ASA   Mixed hyperlipidemia -Continue statin   Hypokalemia/hypomagnesemia -Repleted   Thrombocytopenia -DJS--9.702 -O37 858 -Folic acid   CKD stage IIIb -Baseline creatinine 1.2-1.5 -Monitor with diuresis   Tobacco abuse, in remission                     Family Communication:  daughter updated at bedside 12/30   Consultants:  none   Code Status:  FULL    DVT Prophylaxis:  [place] Heparin      Procedures: As Listed in Progress Note Above   Antibiotics: Ceftriaxone 12/28 Doxy 12/28             Subjective: Patient denies fevers, chills, headache, chest pain, dyspnea, nausea, vomiting, diarrhea, abdominal pain, dysuria, hematuria, hematochezia, and melena.   Objective: Vitals:   03/14/22 2143 03/15/22 0449 03/15/22 0543 03/15/22 0706  BP: 120/79 115/77 114/71   Pulse: 70 72 72   Resp: '19 17 19   '$ Temp: 98.1 F (36.7 C) (!) 97.4 F (36.3 C) 98 F (36.7 C)   TempSrc: Oral Oral    SpO2:  98% 97% 95%   Weight:    75.5 kg  Height:        Intake/Output Summary (Last 24 hours) at 03/15/2022 0725 Last data filed at 03/14/2022 2154 Gross per 24 hour  Intake 1130 ml  Output 2800 ml  Net -1670 ml   Weight change:  Exam:  General:  Pt is alert, follows commands appropriately, not in acute distress HEENT: No icterus, No thrush, No neck  mass, [place]/AT Cardiovascular: RRR, S1/S2, no rubs, no gallops Respiratory: dimiinshed BS. No wheeze.  Bibasilar crackles. Abdomen: Soft/+BS, non tender, non distended, no guarding Extremities: trace LE edema, No lymphangitis, No petechiae, No rashes, no synovitis   Data Reviewed: I have personally reviewed following labs and imaging studies Basic Metabolic Panel: Recent Labs  Lab 03/12/22 1550 03/12/22 1753 03/13/22 0442 03/14/22 0526 03/15/22 0617  NA 141  --  141 141 140  K 3.4*  --  3.3* 3.2* 3.4*  CL 109  --  110 107 104  CO2 21*  --  '24 26 31  '$ GLUCOSE 98  --  98 100* 98  BUN 14  --  '12 13 16  '$ CREATININE 1.34*  --  1.17 1.38* 1.40*  CALCIUM 8.8*  --  8.3* 8.8* 8.6*  MG  --  1.6*  --  1.7 2.0   Liver Function Tests: No results for input(s): "AST", "ALT", "ALKPHOS", "BILITOT", "PROT", "ALBUMIN" in the last 168 hours. No results for input(s): "LIPASE", "AMYLASE" in the last 168 hours. No results for input(s): "AMMONIA" in the last 168 hours. Coagulation Profile: No results for input(s): "INR", "PROTIME" in the last 168 hours. CBC: Recent Labs  Lab 03/12/22 1550 03/13/22 0442  WBC 8.1 5.7  HGB 12.4* 11.1*  HCT 39.9 35.9*  MCV 102.6* 101.1*  PLT 105* 94*   Cardiac Enzymes: No results for input(s): "CKTOTAL", "CKMB", "CKMBINDEX", "TROPONINI" in the last 168 hours. BNP: Invalid input(s): "POCBNP" CBG: No results for input(s): "GLUCAP" in the last 168 hours. HbA1C: No results for input(s): "HGBA1C" in the last 72 hours. Urine analysis:    Component Value Date/Time   COLORURINE STRAW (A) 11/15/2019 2016   APPEARANCEUR CLEAR 11/15/2019 2016   LABSPEC 1.006 11/15/2019 2016   PHURINE 5.0 11/15/2019 2016   GLUCOSEU NEGATIVE 11/15/2019 2016   HGBUR LARGE (A) 11/15/2019 2016   BILIRUBINUR NEGATIVE 11/15/2019 2016   KETONESUR NEGATIVE 11/15/2019 2016   PROTEINUR Positive (A) 12/03/2020 1101   PROTEINUR NEGATIVE 11/15/2019 2016   NITRITE NEGATIVE 11/15/2019 2016    LEUKOCYTESUR NEGATIVE 11/15/2019 2016   Sepsis Labs: '@LABRCNTIP'$ (procalcitonin:4,lacticidven:4) ) Recent Results (from the past 240 hour(s))  Resp panel by RT-PCR (RSV, Flu A&B, Covid) Anterior Nasal Swab     Status: None   Collection Time: 03/12/22  5:30 PM   Specimen: Anterior Nasal Swab  Result Value Ref Range Status   SARS Coronavirus 2 by RT PCR NEGATIVE NEGATIVE Final    Comment: (NOTE) SARS-CoV-2 target nucleic acids are NOT DETECTED.  The SARS-CoV-2 RNA is generally detectable in upper respiratory specimens during the acute phase of infection. The lowest concentration of SARS-CoV-2 viral copies this assay can detect is 138 copies/mL. A negative result does not preclude SARS-Cov-2 infection and should not be used as the sole basis for treatment or other patient management decisions. A negative result may occur with  improper specimen collection/handling, submission of specimen other than nasopharyngeal swab, presence of viral mutation(s) within the areas targeted by this assay, and inadequate number of viral copies(<138  copies/mL). A negative result must be combined with clinical observations, patient history, and epidemiological information. The expected result is Negative.  Fact Sheet for Patients:  EntrepreneurPulse.com.au  Fact Sheet for Healthcare Providers:  IncredibleEmployment.be  This test is no t yet approved or cleared by the [place] FDA and  has been authorized for detection and/or diagnosis of SARS-CoV-2 by FDA under an Emergency Use Authorization (EUA). This EUA will remain  in effect (meaning this test can be used) for the duration of the COVID-19 declaration under Section 564(b)(1) of the Act, 21 U.S.C.section 360bbb-3(b)(1), unless the authorization is terminated  or revoked sooner.       Influenza A by PCR NEGATIVE NEGATIVE Final   Influenza B by PCR NEGATIVE NEGATIVE Final    Comment: (NOTE) The Xpert  Xpress SARS-CoV-2/FLU/RSV plus assay is intended as an aid in the diagnosis of influenza from Nasopharyngeal swab specimens and should not be used as a sole basis for treatment. Nasal washings and aspirates are unacceptable for Xpert Xpress SARS-CoV-2/FLU/RSV testing.  Fact Sheet for Patients: EntrepreneurPulse.com.au  Fact Sheet for Healthcare Providers: IncredibleEmployment.be  This test is not yet approved or cleared by the [place] FDA and has been authorized for detection and/or diagnosis of SARS-CoV-2 by FDA under an Emergency Use Authorization (EUA). This EUA will remain in effect (meaning this test can be used) for the duration of the COVID-19 declaration under Section 564(b)(1) of the Act, 21 U.S.C. section 360bbb-3(b)(1), unless the authorization is terminated or revoked.     Resp Syncytial Virus by PCR NEGATIVE NEGATIVE Final    Comment: (NOTE) Fact Sheet for Patients: EntrepreneurPulse.com.au  Fact Sheet for Healthcare Providers: IncredibleEmployment.be  This test is not yet approved or cleared by the [place] FDA and has been authorized for detection and/or diagnosis of SARS-CoV-2 by FDA under an Emergency Use Authorization (EUA). This EUA will remain in effect (meaning this test can be used) for the duration of the COVID-19 declaration under Section 564(b)(1) of the Act, 21 U.S.C. section 360bbb-3(b)(1), unless the authorization is terminated or revoked.  Performed at [facility], [address] [place], [place] [address]      Scheduled Meds:  albuterol  2.5 mg Nebulization BID   famotidine  20 mg Oral BID   furosemide  40 mg Intravenous Daily   heparin  5,000 Units Subcutaneous Q8H   spironolactone  12.5 mg Oral Daily   Continuous Infusions:  Procedures/Studies: ECHOCARDIOGRAM COMPLETE  Result Date: 03/13/2022    ECHOCARDIOGRAM REPORT   Patient Name:   [name] Date of Exam: 03/13/2022 Medical Rec #:  [id number]           Height:       70.0 in Accession #:    [id number]          Weight:       166.0 lb Date of Birth:  01-Dec-1925           BSA:          1.928 m Patient Age:    73 years            BP:           127/85 mmHg Patient Gender: M                   HR:           81 bpm. Exam Location:  [facility] Procedure: 2D Echo, Cardiac Doppler and Color Doppler Indications:  Congestive Heart Failure I50.9  History:        Patient has no prior history of Echocardiogram examinations.                 CHF, Arrythmias:LBBB; Risk Factors:Hypertension and                 Dyslipidemia. Stage 3b chronic kidney disease ([place]), TOBACCO                 ABUSE.  Sonographer:    Alvino Chapel RCS Referring Phys: [phone] [place]  1. Left ventricular ejection fraction, by estimation, is 30 to 35%. The left ventricle has moderately decreased function. The left ventricle demonstrates global hypokinesis. The left ventricular internal cavity size was moderately dilated. Left ventricular diastolic parameters were normal.  2. Right ventricular systolic function is mildly reduced. The right ventricular size is mildly enlarged. There is mildly elevated pulmonary artery systolic pressure.  3. Left atrial size was mildly dilated.  4. Right atrial size was mildly dilated.  5. The mitral valve is normal in structure. Mild mitral valve regurgitation. No evidence of mitral stenosis.  6. Tricuspid valve regurgitation is moderate.  7. The aortic valve is tricuspid. There is mild calcification of the aortic valve. Aortic valve regurgitation is mild. Aortic valve sclerosis is present, with no evidence of aortic valve stenosis.  8. The inferior vena cava is dilated in size with <50% respiratory variability, suggesting right atrial pressure of 15 mmHg. FINDINGS  Left Ventricle: Left ventricular ejection fraction, by estimation, is 30 to 35%. The left ventricle has moderately  decreased function. The left ventricle demonstrates global hypokinesis. The left ventricular internal cavity size was moderately dilated. There is no left ventricular hypertrophy. Left ventricular diastolic parameters were normal. Right Ventricle: The right ventricular size is mildly enlarged. No increase in right ventricular wall thickness. Right ventricular systolic function is mildly reduced. There is mildly elevated pulmonary artery systolic pressure. The tricuspid regurgitant  velocity is 3.03 m/s, and with an assumed right atrial pressure of 3 mmHg, the estimated right ventricular systolic pressure is 62.8 mmHg. Left Atrium: Left atrial size was mildly dilated. Right Atrium: Right atrial size was mildly dilated. Pericardium: Trivial pericardial effusion is present. The pericardial effusion is localized near the right atrium and anterior to the right ventricle. Mitral Valve: The mitral valve is normal in structure. Mild mitral valve regurgitation. No evidence of mitral valve stenosis. Tricuspid Valve: The tricuspid valve is normal in structure. Tricuspid valve regurgitation is moderate . No evidence of tricuspid stenosis. Aortic Valve: The aortic valve is tricuspid. There is mild calcification of the aortic valve. Aortic valve regurgitation is mild. Aortic regurgitation PHT measures 525 msec. Aortic valve sclerosis is present, with no evidence of aortic valve stenosis. Pulmonic Valve: The pulmonic valve was normal in structure. Pulmonic valve regurgitation is not visualized. No evidence of pulmonic stenosis. Aorta: The aortic root is normal in size and structure. Venous: The inferior vena cava is dilated in size with less than 50% respiratory variability, suggesting right atrial pressure of 15 mmHg. IAS/Shunts: No atrial level shunt detected by color flow Doppler.  LEFT VENTRICLE PLAX 2D LVIDd:         5.50 cm      Diastology LVIDs:         4.00 cm      LV e' medial:    4.35 cm/s LV PW:         1.00 cm  LV  E/e' medial:  25.3 LV IVS:        1.10 cm      LV e' lateral:   8.05 cm/s LVOT diam:     2.00 cm      LV E/e' lateral: 13.7 LV SV:         52 LV SV Index:   27 LVOT Area:     3.14 cm  LV Volumes (MOD) LV vol d, MOD A2C: 115.0 ml LV vol d, MOD A4C: 119.0 ml LV vol s, MOD A2C: 74.7 ml LV vol s, MOD A4C: 77.0 ml LV SV MOD A2C:     40.3 ml LV SV MOD A4C:     119.0 ml LV SV MOD BP:      39.8 ml RIGHT VENTRICLE RV S prime:     10.70 cm/s TAPSE (M-mode): 2.4 cm LEFT ATRIUM              Index        RIGHT ATRIUM           Index LA diam:        3.90 cm  2.02 cm/m   RA Area:     20.60 cm LA Vol (A2C):   103.0 ml 53.41 ml/m  RA Volume:   59.30 ml  30.75 ml/m LA Vol (A4C):   90.6 ml  46.96 ml/m LA Biplane Vol: 104.0 ml 53.93 ml/m  AORTIC VALVE LVOT Vmax:   90.20 cm/s LVOT Vmean:  55.900 cm/s LVOT VTI:    0.164 m AI PHT:      525 msec  AORTA Ao Root diam: 3.70 cm MITRAL VALVE                TRICUSPID VALVE MV Area (PHT): 5.13 cm     TR Peak grad:   36.7 mmHg MV Decel Time: 148 msec     TR Vmax:        303.00 cm/s MR Peak grad: 76.7 mmHg MR Mean grad: 45.0 mmHg     SHUNTS MR Vmax:      438.00 cm/s   Systemic VTI:  0.16 m MR Vmean:     313.0 cm/s    Systemic Diam: 2.00 cm MV E velocity: 110.00 cm/s MV A velocity: 105.00 cm/s MV E/A ratio:  1.05 Jenkins Rouge MD Electronically signed by Jenkins Rouge MD Signature Date/Time: 03/13/2022/8:39:31 PM    Final    CT Angio Chest Pulmonary Embolism (PE) W or WO Contrast  Result Date: 03/12/2022 CLINICAL DATA:  Acute respiratory failure and hypoxia. EXAM: CT ANGIOGRAPHY CHEST WITH CONTRAST TECHNIQUE: Multidetector CT imaging of the chest was performed using the standard protocol during bolus administration of intravenous contrast. Multiplanar CT image reconstructions and MIPs were obtained to evaluate the vascular anatomy. RADIATION DOSE REDUCTION: This exam was performed according to the departmental dose-optimization program which includes automated exposure control, adjustment  of the mA and/or kV according to patient size and/or use of iterative reconstruction technique. CONTRAST:  64m OMNIPAQUE IOHEXOL 350 MG/ML SOLN COMPARISON:  Chest radiograph dated 03/12/2022 and CT dated 09/18/2021. FINDINGS: Evaluation of this exam is limited due to respiratory motion artifact. Cardiovascular: Mild cardiomegaly. Coronary vascular calcification of the LAD. No pericardial effusion. There is mild atherosclerotic calcification of the thoracic aorta. No aneurysmal dilatation. No pulmonary artery embolus identified. Mediastinum/Nodes: No hilar or mediastinal adenopathy. The esophagus is grossly unremarkable. No mediastinal fluid collection. Lungs/Pleura: Moderate bilateral pleural effusions with partial compressive atelectasis of the lower lobes  versus pneumonia. There is diffuse interstitial and interlobular septal prominence consistent with edema. There is background of emphysema. No pneumothorax. The central airways are patent. Upper Abdomen: No acute abnormality. Musculoskeletal: Osteopenia with degenerative changes of the spine. No acute osseous pathology. Review of the MIP images confirms the above findings. IMPRESSION: 1. No CT evidence of pulmonary artery embolus. 2. Moderate bilateral pleural effusions with partial compressive atelectasis of the lower lobes versus pneumonia. 3. Cardiomegaly with coronary vascular calcification of the LAD. 4.  Aortic Atherosclerosis (ICD10-I70.0). Electronically Signed   By: Anner Crete M.D.   On: 03/12/2022 20:10   DG Chest 2 View  Result Date: 03/12/2022 CLINICAL DATA:  Shortness of breath with exertion over the past couple of weeks. EXAM: CHEST - 2 VIEW COMPARISON:  09/18/2021 FINDINGS: Lungs are hyperinflated. There are new bilateral pleural effusions. There is significant bibasilar opacity obscuring the hemidiaphragms, consistent with atelectasis or infiltrates. No evidence for pulmonary edema. IMPRESSION: New bilateral pleural effusions and  bibasilar atelectasis or infiltrates. Electronically Signed   By: Nolon Nations M.D.   On: 03/12/2022 16:00    Orson Eva, DO  [facility]  If 7PM-7AM, please contact night-coverage [email or web address] Password [email or web address] 03/15/2022, 7:25 AM   LOS: 3 days

## 2022-03-15 NOTE — Progress Notes (Signed)
Telemetry called and stated patient HR dropping down into 30s. Patient awake and sitting up in bed, asymptomatic and no complaints. VS stable. Dr. Alcario Drought notified through secure chat. Will continue to monitor closely.

## 2022-03-15 NOTE — Progress Notes (Signed)
Telemetry called and stated patient was having some ST elevation. 12 lead EKG obtained, placed in chart. Patient awake, alert and oriented x4. No complaints of pain or discomfort. Secure chat sent to Dr. Alcario Drought. Will continue to monitor

## 2022-03-16 DIAGNOSIS — I5021 Acute systolic (congestive) heart failure: Secondary | ICD-10-CM | POA: Diagnosis not present

## 2022-03-16 DIAGNOSIS — J9601 Acute respiratory failure with hypoxia: Secondary | ICD-10-CM | POA: Diagnosis not present

## 2022-03-16 DIAGNOSIS — N1832 Chronic kidney disease, stage 3b: Secondary | ICD-10-CM | POA: Diagnosis not present

## 2022-03-16 DIAGNOSIS — I447 Left bundle-branch block, unspecified: Secondary | ICD-10-CM | POA: Diagnosis not present

## 2022-03-16 LAB — BASIC METABOLIC PANEL
Anion gap: 7 (ref 5–15)
BUN: 19 mg/dL (ref 8–23)
CO2: 28 mmol/L (ref 22–32)
Calcium: 8.4 mg/dL — ABNORMAL LOW (ref 8.9–10.3)
Chloride: 103 mmol/L (ref 98–111)
Creatinine, Ser: 1.35 mg/dL — ABNORMAL HIGH (ref 0.61–1.24)
GFR, Estimated: 49 mL/min — ABNORMAL LOW (ref >60)
Glucose, Bld: 97 mg/dL (ref 70–99)
Potassium: 3.8 mmol/L (ref 3.5–5.1)
Sodium: 138 mmol/L (ref 135–145)

## 2022-03-16 MED ORDER — DAPAGLIFLOZIN PROPANEDIOL 10 MG PO TABS
10.0000 mg | ORAL_TABLET | Freq: Every day | ORAL | Status: DC
  Administered 2022-03-17: 10 mg via ORAL
  Filled 2022-03-16: qty 1

## 2022-03-16 NOTE — Progress Notes (Signed)
PROGRESS NOTE  [name] KDT:[id number] DOB: 1926/01/20 DOA: 03/12/2022 PCP: [name], MD  Brief History2  89 year old male with a history of asthma, hypertension, gouty arthritis, and hyperlipidemia presenting with shortness of breath which she states has been ongoing for several weeks.  He states that his breathing has been worse since Christmas day.  He went to see his PCP on 03/12/2022.  EKG showed left bundle branch block.  In commendation with his shortness of breath, the patient was sent to the emergency department for further evaluation and treatment.  The patient states that his lower extremity edema is a little better than usual.  He denies any chest pain, fevers, chills, nausea, vomiting, diarrhea, abdominal pain.  There is no hemoptysis. In the ED, the patient was afebrile and hemodynamically stable with oxygen saturation 99% on 2 L.  He was 89% on room air.  WBC 8.1, hemoglobin 12.4, platelets 105,000.  Sodium 141, potassium 3.4, bicarbonate 21, serum creatinine 1.34.  BNP was 492.  COVID PCR was negative.  EKG shows sinus rhythm with IVCD.  There is nonspecific ST changes.  Chest x-ray showed bilateral pleural effusions.  The patient was given a dose of IV furosemide and admitted for further evaluation and treatment.   During the night 12/30-12/31, the patient developed bradycardia in the 40s while sleeping.  He was asymptomatic.  Upon awakening, pt was completely lucid and denied any cp, sob, dizziness.  He remained hemodynamically stable.  Coreg was discontinued.   Assessment/Plan: Acute respiratory failure with hypoxia -Secondary to pleural effusions -Presented with tachypnea and oxygen saturation 89% RA -Stable on 2 L nasal cannula -Wean oxygen for saturation greater 92% -weaned to RA on 03/14/22   Acute HFrEF -12/29 Echo--EF 30-35%, global HK -Continue IV furosemide>>transition to po lasix 12/31 -ReDS Vest reading -Accurate I's and  O's--incomplete -Daily weights--NEG 6 lb -d/c coreg due to bradycardia>>HR into 40s -continue spironolactone and losartan -add SGLT inhibitor   Sinus Bradycardia -reviewed telemetry from evening 12/30-12/31>>sinus brady with PACs and PVCs--NO afib -d/c coreg -upon my eval of patient in AM 12/31--HR slowly improving>>70s while awake -personally reviewed EKG--sinus, TWI V2-V4 -TSH 3.511 -pt completely asymptomatic, no cp, no sob, no dizziness -03/16/22--HR 60-70s   Elevated troponin -Demand ischemia -83>>85 at time of admission -No chest pain -Personally reviewed EKG--sinus rhythm, IVCD, nonspecific ST change -start ASA   Mixed hyperlipidemia -Continue statin   Hypokalemia/hypomagnesemia -Repleted   Thrombocytopenia -DXI--3.382 -N05 397 -Folic acid   CKD stage IIIb -Baseline creatinine 1.2-1.5 -Monitor with diuresis   Tobacco abuse, in remission                     Family Communication:  son updated at bedside 03/16/22   Consultants:  none   Code Status:  FULL    DVT Prophylaxis:  [place] Heparin      Procedures: As Listed in Progress Note Above   Antibiotics: Ceftriaxone 12/28 Doxy 12/28             Subjective: Patient denies fevers, chills, headache, chest pain, dyspnea, nausea, vomiting, diarrhea, abdominal pain, dysuria, hematuria, hematochezia, and melena.   Objective: Vitals:   03/16/22 0511 03/16/22 0517 03/16/22 0839 03/16/22 1336  BP: 124/75   103/67  Pulse: 71   68  Resp: 18   (!) 22  Temp: 98 F (36.7 C)   97.7 F (36.5 C)  TempSrc: Oral   Oral  SpO2:  98%  96% 99%  Weight:  75.1 kg    Height:        Intake/Output Summary (Last 24 hours) at 03/16/2022 1711 Last data filed at 03/16/2022 1524 Gross per 24 hour  Intake 600 ml  Output 500 ml  Net 100 ml   Weight change:  Exam:  General:  Pt is alert, follows commands appropriately, not in acute distress HEENT: No icterus, No thrush, No neck mass, [place]/AT Cardiovascular: RRR,  S1/S2, no rubs, no gallops Respiratory: CTA bilaterally, no wheezing, no crackles, no rhonchi Abdomen: Soft/+BS, non tender, non distended, no guarding Extremities: No edema, No lymphangitis, No petechiae, No rashes, no synovitis   Data Reviewed: I have personally reviewed following labs and imaging studies Basic Metabolic Panel: Recent Labs  Lab 03/12/22 1550 03/12/22 1753 03/13/22 0442 03/14/22 0526 03/15/22 0617 03/16/22 0700  NA 141  --  141 141 140 138  K 3.4*  --  3.3* 3.2* 3.4* 3.8  CL 109  --  110 107 104 103  CO2 21*  --  '24 26 31 28  '$ GLUCOSE 98  --  98 100* 98 97  BUN 14  --  '12 13 16 19  '$ CREATININE 1.34*  --  1.17 1.38* 1.40* 1.35*  CALCIUM 8.8*  --  8.3* 8.8* 8.6* 8.4*  MG  --  1.6*  --  1.7 2.0  --    Liver Function Tests: No results for input(s): "AST", "ALT", "ALKPHOS", "BILITOT", "PROT", "ALBUMIN" in the last 168 hours. No results for input(s): "LIPASE", "AMYLASE" in the last 168 hours. No results for input(s): "AMMONIA" in the last 168 hours. Coagulation Profile: No results for input(s): "INR", "PROTIME" in the last 168 hours. CBC: Recent Labs  Lab 03/12/22 1550 03/13/22 0442  WBC 8.1 5.7  HGB 12.4* 11.1*  HCT 39.9 35.9*  MCV 102.6* 101.1*  PLT 105* 94*   Cardiac Enzymes: No results for input(s): "CKTOTAL", "CKMB", "CKMBINDEX", "TROPONINI" in the last 168 hours. BNP: Invalid input(s): "POCBNP" CBG: No results for input(s): "GLUCAP" in the last 168 hours. HbA1C: No results for input(s): "HGBA1C" in the last 72 hours. Urine analysis:    Component Value Date/Time   COLORURINE STRAW (A) 11/15/2019 2016   APPEARANCEUR CLEAR 11/15/2019 2016   LABSPEC 1.006 11/15/2019 2016   PHURINE 5.0 11/15/2019 2016   GLUCOSEU NEGATIVE 11/15/2019 2016   HGBUR LARGE (A) 11/15/2019 2016   BILIRUBINUR NEGATIVE 11/15/2019 2016   KETONESUR NEGATIVE 11/15/2019 2016   PROTEINUR Positive (A) 12/03/2020 1101   PROTEINUR NEGATIVE 11/15/2019 2016   NITRITE NEGATIVE  11/15/2019 2016   LEUKOCYTESUR NEGATIVE 11/15/2019 2016   Sepsis Labs: '@LABRCNTIP'$ (procalcitonin:4,lacticidven:4) ) Recent Results (from the past 240 hour(s))  Resp panel by RT-PCR (RSV, Flu A&B, Covid) Anterior Nasal Swab     Status: None   Collection Time: 03/12/22  5:30 PM   Specimen: Anterior Nasal Swab  Result Value Ref Range Status   SARS Coronavirus 2 by RT PCR NEGATIVE NEGATIVE Final    Comment: (NOTE) SARS-CoV-2 target nucleic acids are NOT DETECTED.  The SARS-CoV-2 RNA is generally detectable in upper respiratory specimens during the acute phase of infection. The lowest concentration of SARS-CoV-2 viral copies this assay can detect is 138 copies/mL. A negative result does not preclude SARS-Cov-2 infection and should not be used as the sole basis for treatment or other patient management decisions. A negative result may occur with  improper specimen collection/handling, submission of specimen other than nasopharyngeal swab, presence of viral mutation(s)  within the areas targeted by this assay, and inadequate number of viral copies(<138 copies/mL). A negative result must be combined with clinical observations, patient history, and epidemiological information. The expected result is Negative.  Fact Sheet for Patients:  EntrepreneurPulse.com.au  Fact Sheet for Healthcare Providers:  IncredibleEmployment.be  This test is no t yet approved or cleared by the [place] FDA and  has been authorized for detection and/or diagnosis of SARS-CoV-2 by FDA under an Emergency Use Authorization (EUA). This EUA will remain  in effect (meaning this test can be used) for the duration of the COVID-19 declaration under Section 564(b)(1) of the Act, 21 U.S.C.section 360bbb-3(b)(1), unless the authorization is terminated  or revoked sooner.       Influenza A by PCR NEGATIVE NEGATIVE Final   Influenza B by PCR NEGATIVE NEGATIVE Final    Comment:  (NOTE) The Xpert Xpress SARS-CoV-2/FLU/RSV plus assay is intended as an aid in the diagnosis of influenza from Nasopharyngeal swab specimens and should not be used as a sole basis for treatment. Nasal washings and aspirates are unacceptable for Xpert Xpress SARS-CoV-2/FLU/RSV testing.  Fact Sheet for Patients: EntrepreneurPulse.com.au  Fact Sheet for Healthcare Providers: IncredibleEmployment.be  This test is not yet approved or cleared by the [place] FDA and has been authorized for detection and/or diagnosis of SARS-CoV-2 by FDA under an Emergency Use Authorization (EUA). This EUA will remain in effect (meaning this test can be used) for the duration of the COVID-19 declaration under Section 564(b)(1) of the Act, 21 U.S.C. section 360bbb-3(b)(1), unless the authorization is terminated or revoked.     Resp Syncytial Virus by PCR NEGATIVE NEGATIVE Final    Comment: (NOTE) Fact Sheet for Patients: EntrepreneurPulse.com.au  Fact Sheet for Healthcare Providers: IncredibleEmployment.be  This test is not yet approved or cleared by the [place] FDA and has been authorized for detection and/or diagnosis of SARS-CoV-2 by FDA under an Emergency Use Authorization (EUA). This EUA will remain in effect (meaning this test can be used) for the duration of the COVID-19 declaration under Section 564(b)(1) of the Act, 21 U.S.C. section 360bbb-3(b)(1), unless the authorization is terminated or revoked.  Performed at [facility], [address], [place], [place] [address]      Scheduled Meds:  albuterol  2.5 mg Nebulization BID   aspirin  81 mg Oral Daily   [START ON 03/17/2022] dapagliflozin propanediol  10 mg Oral Daily   famotidine  20 mg Oral BID   furosemide  40 mg Oral Daily   heparin  5,000 Units Subcutaneous Q8H   losartan  25 mg Oral Daily   rosuvastatin  20 mg Oral Daily   spironolactone  12.5 mg  Oral Daily   Continuous Infusions:  Procedures/Studies: ECHOCARDIOGRAM COMPLETE  Result Date: 03/13/2022    ECHOCARDIOGRAM REPORT   Patient Name:   [name] Date of Exam: 03/13/2022 Medical Rec #:  [id number]           Height:       70.0 in Accession #:    [id number]          Weight:       166.0 lb Date of Birth:  05-14-1925           BSA:          1.928 m Patient Age:    12 years            BP:           127/85 mmHg Patient  Gender: M                   HR:           81 bpm. Exam Location:  [facility] Procedure: 2D Echo, Cardiac Doppler and Color Doppler Indications:    Congestive Heart Failure I50.9  History:        Patient has no prior history of Echocardiogram examinations.                 CHF, Arrythmias:LBBB; Risk Factors:Hypertension and                 Dyslipidemia. Stage 3b chronic kidney disease ([place]), TOBACCO                 ABUSE.  Sonographer:    Alvino Chapel RCS Referring Phys: [phone] [place]  1. Left ventricular ejection fraction, by estimation, is 30 to 35%. The left ventricle has moderately decreased function. The left ventricle demonstrates global hypokinesis. The left ventricular internal cavity size was moderately dilated. Left ventricular diastolic parameters were normal.  2. Right ventricular systolic function is mildly reduced. The right ventricular size is mildly enlarged. There is mildly elevated pulmonary artery systolic pressure.  3. Left atrial size was mildly dilated.  4. Right atrial size was mildly dilated.  5. The mitral valve is normal in structure. Mild mitral valve regurgitation. No evidence of mitral stenosis.  6. Tricuspid valve regurgitation is moderate.  7. The aortic valve is tricuspid. There is mild calcification of the aortic valve. Aortic valve regurgitation is mild. Aortic valve sclerosis is present, with no evidence of aortic valve stenosis.  8. The inferior vena cava is dilated in size with <50% respiratory variability, suggesting  right atrial pressure of 15 mmHg. FINDINGS  Left Ventricle: Left ventricular ejection fraction, by estimation, is 30 to 35%. The left ventricle has moderately decreased function. The left ventricle demonstrates global hypokinesis. The left ventricular internal cavity size was moderately dilated. There is no left ventricular hypertrophy. Left ventricular diastolic parameters were normal. Right Ventricle: The right ventricular size is mildly enlarged. No increase in right ventricular wall thickness. Right ventricular systolic function is mildly reduced. There is mildly elevated pulmonary artery systolic pressure. The tricuspid regurgitant  velocity is 3.03 m/s, and with an assumed right atrial pressure of 3 mmHg, the estimated right ventricular systolic pressure is 44.0 mmHg. Left Atrium: Left atrial size was mildly dilated. Right Atrium: Right atrial size was mildly dilated. Pericardium: Trivial pericardial effusion is present. The pericardial effusion is localized near the right atrium and anterior to the right ventricle. Mitral Valve: The mitral valve is normal in structure. Mild mitral valve regurgitation. No evidence of mitral valve stenosis. Tricuspid Valve: The tricuspid valve is normal in structure. Tricuspid valve regurgitation is moderate . No evidence of tricuspid stenosis. Aortic Valve: The aortic valve is tricuspid. There is mild calcification of the aortic valve. Aortic valve regurgitation is mild. Aortic regurgitation PHT measures 525 msec. Aortic valve sclerosis is present, with no evidence of aortic valve stenosis. Pulmonic Valve: The pulmonic valve was normal in structure. Pulmonic valve regurgitation is not visualized. No evidence of pulmonic stenosis. Aorta: The aortic root is normal in size and structure. Venous: The inferior vena cava is dilated in size with less than 50% respiratory variability, suggesting right atrial pressure of 15 mmHg. IAS/Shunts: No atrial level shunt detected by color flow  Doppler.  LEFT VENTRICLE PLAX 2D LVIDd:  5.50 cm      Diastology LVIDs:         4.00 cm      LV e' medial:    4.35 cm/s LV PW:         1.00 cm      LV E/e' medial:  25.3 LV IVS:        1.10 cm      LV e' lateral:   8.05 cm/s LVOT diam:     2.00 cm      LV E/e' lateral: 13.7 LV SV:         52 LV SV Index:   27 LVOT Area:     3.14 cm  LV Volumes (MOD) LV vol d, MOD A2C: 115.0 ml LV vol d, MOD A4C: 119.0 ml LV vol s, MOD A2C: 74.7 ml LV vol s, MOD A4C: 77.0 ml LV SV MOD A2C:     40.3 ml LV SV MOD A4C:     119.0 ml LV SV MOD BP:      39.8 ml RIGHT VENTRICLE RV S prime:     10.70 cm/s TAPSE (M-mode): 2.4 cm LEFT ATRIUM              Index        RIGHT ATRIUM           Index LA diam:        3.90 cm  2.02 cm/m   RA Area:     20.60 cm LA Vol (A2C):   103.0 ml 53.41 ml/m  RA Volume:   59.30 ml  30.75 ml/m LA Vol (A4C):   90.6 ml  46.96 ml/m LA Biplane Vol: 104.0 ml 53.93 ml/m  AORTIC VALVE LVOT Vmax:   90.20 cm/s LVOT Vmean:  55.900 cm/s LVOT VTI:    0.164 m AI PHT:      525 msec  AORTA Ao Root diam: 3.70 cm MITRAL VALVE                TRICUSPID VALVE MV Area (PHT): 5.13 cm     TR Peak grad:   36.7 mmHg MV Decel Time: 148 msec     TR Vmax:        303.00 cm/s MR Peak grad: 76.7 mmHg MR Mean grad: 45.0 mmHg     SHUNTS MR Vmax:      438.00 cm/s   Systemic VTI:  0.16 m MR Vmean:     313.0 cm/s    Systemic Diam: 2.00 cm MV E velocity: 110.00 cm/s MV A velocity: 105.00 cm/s MV E/A ratio:  1.05 Jenkins Rouge MD Electronically signed by Jenkins Rouge MD Signature Date/Time: 03/13/2022/8:39:31 PM    Final    CT Angio Chest Pulmonary Embolism (PE) W or WO Contrast  Result Date: 03/12/2022 CLINICAL DATA:  Acute respiratory failure and hypoxia. EXAM: CT ANGIOGRAPHY CHEST WITH CONTRAST TECHNIQUE: Multidetector CT imaging of the chest was performed using the standard protocol during bolus administration of intravenous contrast. Multiplanar CT image reconstructions and MIPs were obtained to evaluate the vascular anatomy.  RADIATION DOSE REDUCTION: This exam was performed according to the departmental dose-optimization program which includes automated exposure control, adjustment of the mA and/or kV according to patient size and/or use of iterative reconstruction technique. CONTRAST:  67m OMNIPAQUE IOHEXOL 350 MG/ML SOLN COMPARISON:  Chest radiograph dated 03/12/2022 and CT dated 09/18/2021. FINDINGS: Evaluation of this exam is limited due to respiratory motion artifact. Cardiovascular: Mild cardiomegaly. Coronary vascular calcification of the LAD.  No pericardial effusion. There is mild atherosclerotic calcification of the thoracic aorta. No aneurysmal dilatation. No pulmonary artery embolus identified. Mediastinum/Nodes: No hilar or mediastinal adenopathy. The esophagus is grossly unremarkable. No mediastinal fluid collection. Lungs/Pleura: Moderate bilateral pleural effusions with partial compressive atelectasis of the lower lobes versus pneumonia. There is diffuse interstitial and interlobular septal prominence consistent with edema. There is background of emphysema. No pneumothorax. The central airways are patent. Upper Abdomen: No acute abnormality. Musculoskeletal: Osteopenia with degenerative changes of the spine. No acute osseous pathology. Review of the MIP images confirms the above findings. IMPRESSION: 1. No CT evidence of pulmonary artery embolus. 2. Moderate bilateral pleural effusions with partial compressive atelectasis of the lower lobes versus pneumonia. 3. Cardiomegaly with coronary vascular calcification of the LAD. 4.  Aortic Atherosclerosis (ICD10-I70.0). Electronically Signed   By: Anner Crete M.D.   On: 03/12/2022 20:10   DG Chest 2 View  Result Date: 03/12/2022 CLINICAL DATA:  Shortness of breath with exertion over the past couple of weeks. EXAM: CHEST - 2 VIEW COMPARISON:  09/18/2021 FINDINGS: Lungs are hyperinflated. There are new bilateral pleural effusions. There is significant bibasilar opacity  obscuring the hemidiaphragms, consistent with atelectasis or infiltrates. No evidence for pulmonary edema. IMPRESSION: New bilateral pleural effusions and bibasilar atelectasis or infiltrates. Electronically Signed   By: Nolon Nations M.D.   On: 03/12/2022 16:00    Orson Eva, DO  [facility]  If 7PM-7AM, please contact night-coverage [email or web address] Password [email or web address] 03/16/2022, 5:11 PM   LOS: 4 days

## 2022-03-16 NOTE — Care Management Important Message (Addendum)
Important Message  Patient Details  Name: NETHAN CAUDILLO MRN: [id number] Date of Birth: Sep 01, 1925   Medicare Important Message Given:  Yes  Reviewed Medicare IM with patient via room phone [phone]).  Called and left message with Elige Radon, daughter, at [phone] to review Medicare IM upon patient's request.    Update 4:17pm: Daughter returned call.  Reviewed Medicare IM.  Copy of Medicare IM placed in mail to home address on file.    Dannette Barbara 03/16/2022, 2:55 PM

## 2022-03-16 NOTE — Progress Notes (Signed)
Pt had no acute events overnight. [name]

## 2022-03-17 DIAGNOSIS — I5021 Acute systolic (congestive) heart failure: Secondary | ICD-10-CM | POA: Diagnosis not present

## 2022-03-17 DIAGNOSIS — J9601 Acute respiratory failure with hypoxia: Secondary | ICD-10-CM | POA: Diagnosis not present

## 2022-03-17 DIAGNOSIS — I255 Ischemic cardiomyopathy: Secondary | ICD-10-CM

## 2022-03-17 DIAGNOSIS — I5020 Unspecified systolic (congestive) heart failure: Secondary | ICD-10-CM

## 2022-03-17 DIAGNOSIS — D696 Thrombocytopenia, unspecified: Secondary | ICD-10-CM | POA: Diagnosis not present

## 2022-03-17 DIAGNOSIS — N1832 Chronic kidney disease, stage 3b: Secondary | ICD-10-CM | POA: Diagnosis not present

## 2022-03-17 LAB — BASIC METABOLIC PANEL
Anion gap: 9 (ref 5–15)
BUN: 19 mg/dL (ref 8–23)
CO2: 27 mmol/L (ref 22–32)
Calcium: 8.6 mg/dL — ABNORMAL LOW (ref 8.9–10.3)
Chloride: 104 mmol/L (ref 98–111)
Creatinine, Ser: 1.28 mg/dL — ABNORMAL HIGH (ref 0.61–1.24)
GFR, Estimated: 52 mL/min — ABNORMAL LOW (ref >60)
Glucose, Bld: 95 mg/dL (ref 70–99)
Potassium: 3.7 mmol/L (ref 3.5–5.1)
Sodium: 140 mmol/L (ref 135–145)

## 2022-03-17 MED ORDER — DAPAGLIFLOZIN PROPANEDIOL 10 MG PO TABS: 10.0000 mg | ORAL_TABLET | Freq: Every day | ORAL | 1 refills | Status: DC

## 2022-03-17 MED ORDER — EMPAGLIFLOZIN 10 MG PO TABS: 10.0000 mg | ORAL_TABLET | Freq: Every day | ORAL | 1 refills | Status: DC

## 2022-03-17 MED ORDER — ROSUVASTATIN CALCIUM 20 MG PO TABS: 20.0000 mg | ORAL_TABLET | Freq: Every day | ORAL | 1 refills | Status: DC

## 2022-03-17 MED ORDER — ENSURE ENLIVE PO LIQD
237.0000 mL | Freq: Two times a day (BID) | ORAL | Status: DC
  Administered 2022-03-17: 237 mL via ORAL

## 2022-03-17 MED ORDER — SPIRONOLACTONE 25 MG PO TABS: 12.5000 mg | ORAL_TABLET | Freq: Every day | ORAL | 1 refills | Status: DC

## 2022-03-17 MED ORDER — FUROSEMIDE 20 MG PO TABS: 20.0000 mg | ORAL_TABLET | Freq: Every day | ORAL | 1 refills | Status: DC

## 2022-03-17 MED ORDER — LOSARTAN POTASSIUM 25 MG PO TABS: 25.0000 mg | ORAL_TABLET | Freq: Every day | ORAL | 1 refills | Status: DC

## 2022-03-17 MED ORDER — ASPIRIN 81 MG PO CHEW: 81.0000 mg | CHEWABLE_TABLET | Freq: Every day | ORAL | Status: AC

## 2022-03-17 NOTE — Progress Notes (Signed)
[facility] Discharge Note  Admit Date:  03/12/2022 Discharge date: 03/17/2022   [name] to be D/C'd Home per MD order.  AVS completed. Patient/caregiver able to verbalize understanding.  Discharge Medication: Allergies as of 03/17/2022       Reactions   Penicillin G Benzathine    Other reaction(s): Unknown   Penicillins Rash        Medication List     STOP taking these medications    potassium chloride 10 MEQ tablet Commonly known as: KLOR-CON M       TAKE these medications    acetaminophen 650 MG CR tablet Commonly known as: TYLENOL Take 650-1,300 mg by mouth every 8 (eight) hours as needed for pain.   aspirin 81 MG chewable tablet Chew 1 tablet (81 mg total) by mouth daily. Start taking on: March 18, 2022   empagliflozin 10 MG Tabs tablet Commonly known as: Jardiance Take 1 tablet (10 mg total) by mouth daily.   famotidine 20 MG tablet Commonly known as: PEPCID TAKE (1) TABLET BY MOUTH TWICE DAILY.   furosemide 20 MG tablet Commonly known as: LASIX Take 1 tablet (20 mg total) by mouth daily. What changed:  how much to take how to take this when to take this additional instructions   losartan 25 MG tablet Commonly known as: COZAAR Take 1 tablet (25 mg total) by mouth daily. Start taking on: March 18, 2022   mupirocin ointment 2 % Commonly known as: BACTROBAN Apply 1 Application topically 2 (two) times daily.   psyllium 95 % Pack Commonly known as: HYDROCIL/METAMUCIL Take 1 packet by mouth daily.   rosuvastatin 20 MG tablet Commonly known as: CRESTOR Take 1 tablet (20 mg total) by mouth daily.   spironolactone 25 MG tablet Commonly known as: ALDACTONE Take 0.5 tablets (12.5 mg total) by mouth daily. Start taking on: March 18, 2022   Ventolin HFA 108 (90 Base) MCG/ACT inhaler Generic drug: albuterol INHALE 2 PUFFS EVERY 4 HOURS AS NEEDED.        Discharge Assessment: Vitals:   03/16/22 2049 03/17/22 0420  BP: 113/61 125/76   Pulse: 65 65  Resp: 19 19  Temp: 98 F (36.7 C) 97.6 F (36.4 C)  SpO2: 97% 98%   Skin clean, dry and intact without evidence of skin break down, no evidence of skin tears noted. IV catheter discontinued intact. Site without signs and symptoms of complications - no redness or edema noted at insertion site, patient denies c/o pain - only slight tenderness at site.  Dressing with slight pressure applied.  D/c Instructions-Education: Discharge instructions given to patient/family with verbalized understanding. D/c education completed with patient/family including follow up instructions, medication list, d/c activities limitations if indicated, with other d/c instructions as indicated by MD - patient able to verbalize understanding, all questions fully answered. Patient instructed to return to ED, call 911, or call MD for any changes in condition.  Patient escorted via [place], and D/C home via private auto.  Richrd Prime, LPN 10/14/1029 59:45 PM

## 2022-03-17 NOTE — Consult Note (Addendum)
Cardiology Consultation   Patient ID: [name] MRN: [id number]; DOB: 07-01-1925  Admit date: 03/12/2022 Date of Consult: 03/17/2022  PCP:  [name], MD   [place] Providers Cardiologist:  Dorris Carnes, MD (last seen 2018)       Patient Profile:   [name] is a 89 y.o. male with a hx of with a history of asthma, hypertension, gouty arthritis, and hyperlipidemia presenting with shortness of breath which she states has been ongoing for several weeks.  who is being seen 03/17/2022 for the evaluation of new CM EF 30-35% on echo at the request of Dr. Carles Collet.  History of Present Illness:   [name] is a 89 yo male with history of HTN, HLD, chest pain with normal NST 2018. He was sent to ED by PCP for evaluation of worsening SOB. Echo 03/13/22 LVEF 30-35% global HK. Treated with IV lasix and weight down. Had bradycardia 40's at night so cored stopped. Flat troponins 83,85.  Patient lives with his wife. He says he stopped a diuretic a year ago but daughter says he was supposed to take it prn. He had gradual fluid build up with edema and SOB. He's feeling much better now and hoping to go home. No recent chest pain.    Past Medical History:  Diagnosis Date   Asthma    Diverticulosis    GERD (gastroesophageal reflux disease)    Gout    Heart murmur    Evaluation by a cardiologist years ago was reportedly negative   HTN (hypertension)    Osteoarthritis    Reflux     Past Surgical History:  Procedure Laterality Date   APPENDECTOMY  2007   COLONOSCOPY  2003   COLONOSCOPY N/A 12/28/2012   Procedure: COLONOSCOPY;  Surgeon: Rogene Houston, MD;  Location: [facility];  Service: Endoscopy;  Laterality: N/A;  255-rescheduled to 10:30 Ann notified pt   EYE SURGERY Right    cataract removal   INGUINAL HERNIA REPAIR     Right   JOINT REPLACEMENT     right total knee RIGHT total knee arthroplasty   TOTAL KNEE ARTHROPLASTY  09/2009   Right      Home Medications:  Prior to Admission medications   Medication Sig Start Date End Date Taking? Authorizing Provider  famotidine (PEPCID) 20 MG tablet TAKE (1) TABLET BY MOUTH TWICE DAILY. 02/02/22  Yes Ameduite, Trenton Gammon, FNP  furosemide (LASIX) 20 MG tablet 1 daily as needed pedal edema 03/12/22  Yes Luking, Elayne Snare, MD  mupirocin ointment (BACTROBAN) 2 % Apply 1 Application topically 2 (two) times daily. 10/28/21  Yes Luking, Scott A, MD  psyllium (HYDROCIL/METAMUCIL) 95 % PACK Take 1 packet by mouth daily.   Yes [provider]  VENTOLIN HFA 108 (90 Base) MCG/ACT inhaler INHALE 2 PUFFS EVERY 4 HOURS AS NEEDED. 03/05/22  Yes Luking, Elayne Snare, MD  acetaminophen (TYLENOL) 650 MG CR tablet Take 650-1,300 mg by mouth every 8 (eight) hours as needed for pain.    [provider]  potassium chloride (KLOR-CON M) 10 MEQ tablet Take 1 tablet (10 mEq total) by mouth 2 (two) times daily. Patient not taking: Reported on 03/12/2022 03/12/22   [name], MD  rosuvastatin (CRESTOR) 20 MG tablet TAKE ONE TABLET BY MOUTH ONCE DAILY. Patient not taking: Reported on 03/12/2022 12/04/21   [name], MD    Inpatient Medications: Scheduled Meds:  aspirin  81 mg Oral Daily  dapagliflozin propanediol  10 mg Oral Daily   famotidine  20 mg Oral BID   feeding supplement  237 mL Oral BID BM   furosemide  40 mg Oral Daily   heparin  5,000 Units Subcutaneous Q8H   losartan  25 mg Oral Daily   rosuvastatin  20 mg Oral Daily   spironolactone  12.5 mg Oral Daily    PRN Meds: acetaminophen **OR** acetaminophen, albuterol, polyethylene glycol  Allergies:    Allergies  Allergen Reactions   Penicillin G Benzathine     Other reaction(s): Unknown   Penicillins Rash    Social History:   Social History   Socioeconomic History   Marital status: Married    Spouse name: [name]   Number of children: Not on file   Years of education: Not on file   Highest education level: Not on  file  Occupational History   Occupation: Work at hospital  Tobacco Use   Smoking status: Former    Types: Pipe    Start date: 12/04/1939    Quit date: 1970    Years since quitting: 54.0   Smokeless tobacco: Never   Tobacco comments:    quit smoking cigarettes in 1971  Vaping Use   Vaping Use: Never used  Substance and Sexual Activity   Alcohol use: No   Drug use: No   Sexual activity: Not Currently  Other Topics Concern   Not on file  Social History Narrative   Married, lives with wife [name]. Daughter [name] helps patient.   Social Determinants of Health   Financial Resource Strain: Low Risk  (12/10/2020)   Overall Financial Resource Strain (CARDIA)    Difficulty of Paying Living Expenses: Not hard at all  Food Insecurity: No Food Insecurity (03/12/2022)   Hunger Vital Sign    Worried About Running Out of Food in the Last Year: Never true    Ran Out of Food in the Last Year: Never true  Transportation Needs: No Transportation Needs (03/12/2022)   PRAPARE - Hydrologist (Medical): No    Lack of Transportation (Non-Medical): No  Physical Activity: Sufficiently Active (12/10/2020)   Exercise Vital Sign    Days of Exercise per Week: 5 days    Minutes of Exercise per Session: 30 min  Stress: No Stress Concern Present (12/10/2020)   [place]    Feeling of Stress : Not at all  Social Connections: Moderately Integrated (12/10/2020)   Social Connection and Isolation Panel [NHANES]    Frequency of Communication with Friends and Family: More than three times a week    Frequency of Social Gatherings with Friends and Family: More than three times a week    Attends Religious Services: 1 to 4 times per year    Active Member of [employer] or Organizations: No    Attends Archivist Meetings: Never    Marital Status: Married  Human resources officer Violence: Not At Risk (03/12/2022)   Humiliation,  Afraid, Rape, and Kick questionnaire    Fear of Current or Ex-Partner: No    Emotionally Abused: No    Physically Abused: No    Sexually Abused: No    Family History:     Family History  Problem Relation Age of Onset   Hypertension Mother    Hypertension Father    Heart attack Father    Cancer Brother        colon   Arthritis Other  Heart disease Other        Male < 55   Uterine cancer Maternal Aunt      ROS:  Please see the history of present illness.  Review of Systems  Constitutional: Positive for weight gain.  HENT: Negative.    Cardiovascular:  Positive for dyspnea on exertion and leg swelling.  Respiratory:  Positive for shortness of breath and sleep disturbances due to breathing.   Endocrine: Negative.   Hematologic/Lymphatic: Negative.   Musculoskeletal: Negative.   Gastrointestinal: Negative.   Genitourinary: Negative.   Neurological: Negative.    All other ROS reviewed and negative.     Physical Exam/Data:   Vitals:   03/16/22 2040 03/16/22 2049 03/17/22 0420 03/17/22 0510  BP:  113/61 125/76   Pulse:  65 65   Resp:  19 19   Temp:  98 F (36.7 C) 97.6 F (36.4 C)   TempSrc:      SpO2: 92% 97% 98%   Weight:    75.5 kg  Height:        Intake/Output Summary (Last 24 hours) at 03/17/2022 1057 Last data filed at 03/17/2022 0837 Gross per 24 hour  Intake 120 ml  Output 450 ml  Net -330 ml      03/17/2022    5:10 AM 03/16/2022    5:17 AM 03/15/2022    7:06 AM  Last 3 Weights  Weight (lbs) 166 lb 7.2 oz 165 lb 9.1 oz 166 lb 7.2 oz  Weight (kg) 75.5 kg 75.1 kg 75.5 kg     Body mass index is 23.88 kg/m.  General:  Thin, in no acute distress  HEENT: normal Neck: no JVD Vascular: No carotid bruits; Distal pulses 2+ bilaterally Cardiac:  normal S1, S2; JXB;1/4 systolic murmur LSB Lungs: decreased breath sounds but clear to auscultation bilaterally, no wheezing, rhonchi or rales  Abd: soft, nontender, no hepatomegaly  Ext: no edema Musculoskeletal:   No deformities, BUE and BLE strength normal and equal Skin: warm and dry  Neuro:  CNs 2-12 intact, no focal abnormalities noted Psych:  Normal affect   EKG:  The EKG was personally reviewed and demonstrates:  sinus bradycardia 39/m with LVH and TWI antlat 03/15/22 Telemetry:  Telemetry was personally reviewed and demonstrates:  NSR-ST with PAC's PVC's  Relevant CV Studies:  NST 2018 There was no ST segment deviation noted during stress. The study is normal. There are no perfusion defects consistent with prior infarct or current ischemia. This is a low risk study. The left ventricular ejection fraction is normal (55-65%).    Laboratory Data:  High Sensitivity Troponin:   Recent Labs  Lab 03/12/22 1550 03/12/22 1753 03/15/22 0741 03/15/22 0938  TROPONINIHS 83* 85* 17 17     Chemistry Recent Labs  Lab 03/12/22 1753 03/13/22 0442 03/14/22 0526 03/15/22 0617 03/16/22 0700 03/17/22 0420  NA  --    < > 141 140 138 140  K  --    < > 3.2* 3.4* 3.8 3.7  CL  --    < > 107 104 103 104  CO2  --    < > '26 31 28 27  '$ GLUCOSE  --    < > 100* 98 97 95  BUN  --    < > '13 16 19 19  '$ CREATININE  --    < > 1.38* 1.40* 1.35* 1.28*  CALCIUM  --    < > 8.8* 8.6* 8.4* 8.6*  MG 1.6*  --  1.7  2.0  --   --   GFRNONAA  --    < > 47* 47* 49* 52*  ANIONGAP  --    < > '8 5 7 9   '$ < > = values in this interval not displayed.    Hematology Recent Labs  Lab 03/12/22 1550 03/13/22 0442  WBC 8.1 5.7  RBC 3.89* 3.55*  HGB 12.4* 11.1*  HCT 39.9 35.9*  MCV 102.6* 101.1*  MCH 31.9 31.3  MCHC 31.1 30.9  RDW 12.4 12.3  PLT 105* 94*   Thyroid  Recent Labs  Lab 03/13/22 0859  TSH 3.511  FREET4 0.98    BNP Recent Labs  Lab 03/12/22 1550  BNP 492.0*     Radiology/Studies:  ECHOCARDIOGRAM COMPLETE  Result Date: 03/13/2022    ECHOCARDIOGRAM REPORT   Patient Name:   SHEENA SIMONIS Date of Exam: 03/13/2022 Medical Rec #:  [id number]           Height:       70.0 in Accession #:     [id number]          Weight:       166.0 lb Date of Birth:  Apr 22, 1925           BSA:          1.928 m Patient Age:    50 years            BP:           127/85 mmHg Patient Gender: M                   HR:           81 bpm. Exam Location:  [facility] Procedure: 2D Echo, Cardiac Doppler and Color Doppler Indications:    Congestive Heart Failure I50.9  History:        Patient has no prior history of Echocardiogram examinations.                 CHF, Arrythmias:LBBB; Risk Factors:Hypertension and                 Dyslipidemia. Stage 3b chronic kidney disease ([place]), TOBACCO                 ABUSE.  Sonographer:    Alvino Chapel RCS Referring Phys: [phone] [place]  1. Left ventricular ejection fraction, by estimation, is 30 to 35%. The left ventricle has moderately decreased function. The left ventricle demonstrates global hypokinesis. The left ventricular internal cavity size was moderately dilated. Left ventricular diastolic parameters were normal.  2. Right ventricular systolic function is mildly reduced. The right ventricular size is mildly enlarged. There is mildly elevated pulmonary artery systolic pressure.  3. Left atrial size was mildly dilated.  4. Right atrial size was mildly dilated.  5. The mitral valve is normal in structure. Mild mitral valve regurgitation. No evidence of mitral stenosis.  6. Tricuspid valve regurgitation is moderate.  7. The aortic valve is tricuspid. There is mild calcification of the aortic valve. Aortic valve regurgitation is mild. Aortic valve sclerosis is present, with no evidence of aortic valve stenosis.  8. The inferior vena cava is dilated in size with <50% respiratory variability, suggesting right atrial pressure of 15 mmHg. FINDINGS  Left Ventricle: Left ventricular ejection fraction, by estimation, is 30 to 35%. The left ventricle has moderately decreased function. The left ventricle demonstrates global hypokinesis. The left ventricular internal cavity size  was  moderately dilated. There is no left ventricular hypertrophy. Left ventricular diastolic parameters were normal. Right Ventricle: The right ventricular size is mildly enlarged. No increase in right ventricular wall thickness. Right ventricular systolic function is mildly reduced. There is mildly elevated pulmonary artery systolic pressure. The tricuspid regurgitant  velocity is 3.03 m/s, and with an assumed right atrial pressure of 3 mmHg, the estimated right ventricular systolic pressure is 37.6 mmHg. Left Atrium: Left atrial size was mildly dilated. Right Atrium: Right atrial size was mildly dilated. Pericardium: Trivial pericardial effusion is present. The pericardial effusion is localized near the right atrium and anterior to the right ventricle. Mitral Valve: The mitral valve is normal in structure. Mild mitral valve regurgitation. No evidence of mitral valve stenosis. Tricuspid Valve: The tricuspid valve is normal in structure. Tricuspid valve regurgitation is moderate . No evidence of tricuspid stenosis. Aortic Valve: The aortic valve is tricuspid. There is mild calcification of the aortic valve. Aortic valve regurgitation is mild. Aortic regurgitation PHT measures 525 msec. Aortic valve sclerosis is present, with no evidence of aortic valve stenosis. Pulmonic Valve: The pulmonic valve was normal in structure. Pulmonic valve regurgitation is not visualized. No evidence of pulmonic stenosis. Aorta: The aortic root is normal in size and structure. Venous: The inferior vena cava is dilated in size with less than 50% respiratory variability, suggesting right atrial pressure of 15 mmHg. IAS/Shunts: No atrial level shunt detected by color flow Doppler.  LEFT VENTRICLE PLAX 2D LVIDd:         5.50 cm      Diastology LVIDs:         4.00 cm      LV e' medial:    4.35 cm/s LV PW:         1.00 cm      LV E/e' medial:  25.3 LV IVS:        1.10 cm      LV e' lateral:   8.05 cm/s LVOT diam:     2.00 cm      LV E/e'  lateral: 13.7 LV SV:         52 LV SV Index:   27 LVOT Area:     3.14 cm  LV Volumes (MOD) LV vol d, MOD A2C: 115.0 ml LV vol d, MOD A4C: 119.0 ml LV vol s, MOD A2C: 74.7 ml LV vol s, MOD A4C: 77.0 ml LV SV MOD A2C:     40.3 ml LV SV MOD A4C:     119.0 ml LV SV MOD BP:      39.8 ml RIGHT VENTRICLE RV S prime:     10.70 cm/s TAPSE (M-mode): 2.4 cm LEFT ATRIUM              Index        RIGHT ATRIUM           Index LA diam:        3.90 cm  2.02 cm/m   RA Area:     20.60 cm LA Vol (A2C):   103.0 ml 53.41 ml/m  RA Volume:   59.30 ml  30.75 ml/m LA Vol (A4C):   90.6 ml  46.96 ml/m LA Biplane Vol: 104.0 ml 53.93 ml/m  AORTIC VALVE LVOT Vmax:   90.20 cm/s LVOT Vmean:  55.900 cm/s LVOT VTI:    0.164 m AI PHT:      525 msec  AORTA Ao Root diam: 3.70 cm MITRAL VALVE  TRICUSPID VALVE MV Area (PHT): 5.13 cm     TR Peak grad:   36.7 mmHg MV Decel Time: 148 msec     TR Vmax:        303.00 cm/s MR Peak grad: 76.7 mmHg MR Mean grad: 45.0 mmHg     SHUNTS MR Vmax:      438.00 cm/s   Systemic VTI:  0.16 m MR Vmean:     313.0 cm/s    Systemic Diam: 2.00 cm MV E velocity: 110.00 cm/s MV A velocity: 105.00 cm/s MV E/A ratio:  1.05 Jenkins Rouge MD Electronically signed by Jenkins Rouge MD Signature Date/Time: 03/13/2022/8:39:31 PM    Final      Assessment and Plan:   Acute hypoxic resp failure secondary to new systolic CHF improved with O2 and IV lasix. Weitght 77.2 kg to 75.2 kg.  New CM LVEF 30-35% with global HK on echo 03/15/22. ReDs Vest 30. Can't tolerate BB due to bradycardia. Currently on lasix 40 mg daily, losartan 25 mg daily(new), spiro 12.5 mg daily, farxiga. Crt improving 1.28 today. Stable to go home and will schedule early outpatient f/u. 2 gm sodium diet discussed. Will need bmet next week. No recent chest pain but deep TWI on EKG and intermittent LBBB. Would hold off on ischemic work up at this time unless he exhibits symptoms.can readress at outpatient.  Sinus bradycardia 39/m now improved  off coreg.  Elevated but flat troponins most likely due to demand ischemia  HLD on crestor  Risk Assessment/Risk Scores:      New York Heart Association (NYHA) Functional Class NYHA Class III     For questions or updates, please contact [place] Please consult [email or web address] for contact info under    Signed, Ermalinda Barrios, PA-C 03/17/2022 10:57 AM    Attending note:  Patient seen and examined.  I reviewed his records and discussed the case with Ms. Vita Barley, I agree with her above findings.  Mr. Caldron presents with newly documented HFrEF, LVEF 30 to 35% with global hypokinesis and mildly reduced RV contraction, no major valvular abnormalities beyond mild mitral regurgitation and mildly sclerotic aortic valve.  He presented with fluid overload, improved with diuretics and is now on GDMT including Lasix, Aldactone, Cozaar, and Farxiga.  He did not tolerate Coreg due to bradycardia.  Patient was followed in the past by Dr. Lattie Haw, more recently Dr. Harrington Challenger in 2018.  Underwent low risk Myoview in 2018.  ECG is significantly abnormal with deep anterolateral T wave inversions in the absence of chest pain, also intermittent left bundle branch block.  On examination this morning he appears comfortable, asking to go home.  He is afebrile, heart rate in the 60s in sinus rhythm by telemetry which I personally reviewed, blood pressure 125/76.  Lungs exhibit decreased breath sounds without wheezing or crackles.  Cardiac exam with indistinct PMI, RRR and 1/6 systolic murmur.  No pitting edema.  Pertinent lab work includes potassium 3.7, BUN 19, creatinine 1.28, hemoglobin 11.1, platelets 94.  TSH 3.5.  Chest CTA from December 28 showed no evidence of pulmonary embolus with moderate bilateral pleural effusions and atelectasis, also aortic and coronary calcification.  Anticipate medical therapy for HFrEF with LVEF 30 to 35% and likely some degree of CAD based on CT evidence of  coronary calcification in the LAD distribution.  He does not describe any active chest pain and high-sensitivity troponin I levels are not consistent with ACS.  ECG significantly abnormal as discussed above,  so ischemic etiology for cardiomyopathy is certainly possible.  He has responded well to current GDMT including Lasix, Aldactone, Cozaar, and Farxiga.  Hold off on beta-blocker given conduction system disease with bradycardia.  Would DC on current regimen except decrease Lasix to 20 mg daily.  We will arrange outpatient follow-up in the cardiology clinic, he will need a BMET within the next 2 weeks.  Depending on how he does, further discussion can be had regarding whether additional ischemic testing would be arranged, although my sense is that conservative medical therapy would be the most appropriate option at this point.  Satira Sark, M.D., F.A.C.C. .

## 2022-03-17 NOTE — Progress Notes (Incomplete)
Vitals stable, pt slept through the night with no complaints of pain.

## 2022-03-17 NOTE — Discharge Summary (Addendum)
Physician Discharge Summary   Patient: [name] MRN: [id number] DOB: 1925/08/15  Admit date:     03/12/2022  Discharge date: 03/17/22  Discharge Physician: Shanon Brow [name]   PCP: [name], MD   Recommendations at discharge:   Please follow up with primary care provider within 1-2 weeks  Please repeat BMP and CBC in one week   Hospital Course: 89 year old male with a history of asthma, hypertension, gouty arthritis, and hyperlipidemia presenting with shortness of breath which she states has been ongoing for several weeks.  He states that his breathing has been worse since Christmas day.  He went to see his PCP on 03/12/2022.  EKG showed left bundle branch block.  In commendation with his shortness of breath, the patient was sent to the emergency department for further evaluation and treatment.  The patient states that his lower extremity edema is a little better than usual.  He denies any chest pain, fevers, chills, nausea, vomiting, diarrhea, abdominal pain.  There is no hemoptysis. In the ED, the patient was afebrile and hemodynamically stable with oxygen saturation 99% on 2 L.  He was 89% on room air.  WBC 8.1, hemoglobin 12.4, platelets 105,000.  Sodium 141, potassium 3.4, bicarbonate 21, serum creatinine 1.34.  BNP was 492.  COVID PCR was negative.  EKG shows sinus rhythm with IVCD.  There is nonspecific ST changes.  Chest x-ray showed bilateral pleural effusions.  The patient was given a dose of IV furosemide and admitted for further evaluation and treatment.   During the night 12/30-12/31, the patient developed bradycardia in the 40s while sleeping.  He was asymptomatic.  Upon awakening, pt was completely lucid and denied any cp, sob, dizziness.  He remained hemodynamically stable.  Coreg was discontinued.  Assessment and Plan:  Acute respiratory failure with hypoxia -Secondary to pleural effusions -Presented with tachypnea and oxygen saturation 89% RA -Stable on 2 L nasal  cannula -Wean oxygen for saturation greater 92% -weaned to RA on 03/14/22   Acute HFrEF -12/29 Echo--EF 30-35%, global HK -Continue IV furosemide>>transition to po lasix 12/31 -03/16/22 ReDS Vest = 30 -Accurate I's and O's--incomplete -Daily weights--NEG 6 lb -d/c coreg due to bradycardia>>HR into 40s -continue spironolactone and losartan -added Farxiga for d/c   Sinus Bradycardia -reviewed telemetry from evening 12/30-12/31>>sinus brady with PACs and PVCs--NO afib -d/c coreg -upon my eval of patient in AM 12/31--HR slowly improving>>70s while awake -personally reviewed EKG--sinus, TWI V2-V4 -TSH 3.511 -pt completely asymptomatic, no cp, no sob, no dizziness -03/16/22--HR 60-70s   Elevated troponin -Demand ischemia -83>>85 at time of admission -No chest pain -Personally reviewed EKG--sinus rhythm, IVCD, nonspecific ST change -started ASA -likely has a degree of CAD   Mixed hyperlipidemia -Continue statin   Hypokalemia/hypomagnesemia -Repleted   Thrombocytopenia -ENI--7.782 -U23 536 -Folic acid 9.3   CKD stage IIIb -Baseline creatinine 1.2-1.5 -Monitor with diuresis   Tobacco abuse, in remission                Consultants: cardiologyy Procedures performed: none  Disposition: Home Diet recommendation:  Cardiac diet DISCHARGE MEDICATION: Allergies as of 03/17/2022       Reactions   Penicillin G Benzathine    Other reaction(s): Unknown   Penicillins Rash        Medication List     STOP taking these medications    potassium chloride 10 MEQ tablet Commonly known as: KLOR-CON M       TAKE these medications    acetaminophen 650  MG CR tablet Commonly known as: TYLENOL Take 650-1,300 mg by mouth every 8 (eight) hours as needed for pain.   aspirin 81 MG chewable tablet Chew 1 tablet (81 mg total) by mouth daily. Start taking on: March 18, 2022   dapagliflozin propanediol 10 MG Tabs tablet Commonly known as: Wilder Glade Take 1 tablet (10 mg  total) by mouth daily.   famotidine 20 MG tablet Commonly known as: PEPCID TAKE (1) TABLET BY MOUTH TWICE DAILY.   furosemide 20 MG tablet Commonly known as: LASIX Take 1 tablet (20 mg total) by mouth daily. What changed:  how much to take how to take this when to take this additional instructions   losartan 25 MG tablet Commonly known as: COZAAR Take 1 tablet (25 mg total) by mouth daily. Start taking on: March 18, 2022   mupirocin ointment 2 % Commonly known as: BACTROBAN Apply 1 Application topically 2 (two) times daily.   psyllium 95 % Pack Commonly known as: HYDROCIL/METAMUCIL Take 1 packet by mouth daily.   rosuvastatin 20 MG tablet Commonly known as: CRESTOR Take 1 tablet (20 mg total) by mouth daily.   spironolactone 25 MG tablet Commonly known as: ALDACTONE Take 0.5 tablets (12.5 mg total) by mouth daily. Start taking on: March 18, 2022   Ventolin HFA 108 (90 Base) MCG/ACT inhaler Generic drug: albuterol INHALE 2 PUFFS EVERY 4 HOURS AS NEEDED.        Discharge Exam: Filed Weights   03/15/22 0706 03/16/22 0517 03/17/22 0510  Weight: 75.5 kg 75.1 kg 75.5 kg   HEENT:  [place]/AT, No thrush, no icterus CV:  RRR, no rub, no S3, no S4 Lung:  CTA, no wheeze, no rhonchi Abd:  soft/+BS, NT Ext:  No edema, no lymphangitis, no synovitis, no rash   Condition at discharge: stable  The results of significant diagnostics from this hospitalization (including imaging, microbiology, ancillary and laboratory) are listed below for reference.   Imaging Studies: ECHOCARDIOGRAM COMPLETE  Result Date: 03/13/2022    ECHOCARDIOGRAM REPORT   Patient Name:   [name] Date of Exam: 03/13/2022 Medical Rec #:  [id number]           Height:       70.0 in Accession #:    [id number]          Weight:       166.0 lb Date of Birth:  January 09, 1926           BSA:          1.928 m Patient Age:    34 years            BP:           127/85 mmHg Patient Gender: M                    HR:           81 bpm. Exam Location:  [facility] Procedure: 2D Echo, Cardiac Doppler and Color Doppler Indications:    Congestive Heart Failure I50.9  History:        Patient has no prior history of Echocardiogram examinations.                 CHF, Arrythmias:LBBB; Risk Factors:Hypertension and                 Dyslipidemia. Stage 3b chronic kidney disease ([place]), TOBACCO  ABUSE.  Sonographer:    Alvino Chapel RCS Referring Phys: [id number] [place]  1. Left ventricular ejection fraction, by estimation, is 30 to 35%. The left ventricle has moderately decreased function. The left ventricle demonstrates global hypokinesis. The left ventricular internal cavity size was moderately dilated. Left ventricular diastolic parameters were normal.  2. Right ventricular systolic function is mildly reduced. The right ventricular size is mildly enlarged. There is mildly elevated pulmonary artery systolic pressure.  3. Left atrial size was mildly dilated.  4. Right atrial size was mildly dilated.  5. The mitral valve is normal in structure. Mild mitral valve regurgitation. No evidence of mitral stenosis.  6. Tricuspid valve regurgitation is moderate.  7. The aortic valve is tricuspid. There is mild calcification of the aortic valve. Aortic valve regurgitation is mild. Aortic valve sclerosis is present, with no evidence of aortic valve stenosis.  8. The inferior vena cava is dilated in size with <50% respiratory variability, suggesting right atrial pressure of 15 mmHg. FINDINGS  Left Ventricle: Left ventricular ejection fraction, by estimation, is 30 to 35%. The left ventricle has moderately decreased function. The left ventricle demonstrates global hypokinesis. The left ventricular internal cavity size was moderately dilated. There is no left ventricular hypertrophy. Left ventricular diastolic parameters were normal. Right Ventricle: The right ventricular size is mildly enlarged. No increase in right  ventricular wall thickness. Right ventricular systolic function is mildly reduced. There is mildly elevated pulmonary artery systolic pressure. The tricuspid regurgitant  velocity is 3.03 m/s, and with an assumed right atrial pressure of 3 mmHg, the estimated right ventricular systolic pressure is 83.3 mmHg. Left Atrium: Left atrial size was mildly dilated. Right Atrium: Right atrial size was mildly dilated. Pericardium: Trivial pericardial effusion is present. The pericardial effusion is localized near the right atrium and anterior to the right ventricle. Mitral Valve: The mitral valve is normal in structure. Mild mitral valve regurgitation. No evidence of mitral valve stenosis. Tricuspid Valve: The tricuspid valve is normal in structure. Tricuspid valve regurgitation is moderate . No evidence of tricuspid stenosis. Aortic Valve: The aortic valve is tricuspid. There is mild calcification of the aortic valve. Aortic valve regurgitation is mild. Aortic regurgitation PHT measures 525 msec. Aortic valve sclerosis is present, with no evidence of aortic valve stenosis. Pulmonic Valve: The pulmonic valve was normal in structure. Pulmonic valve regurgitation is not visualized. No evidence of pulmonic stenosis. Aorta: The aortic root is normal in size and structure. Venous: The inferior vena cava is dilated in size with less than 50% respiratory variability, suggesting right atrial pressure of 15 mmHg. IAS/Shunts: No atrial level shunt detected by color flow Doppler.  LEFT VENTRICLE PLAX 2D LVIDd:         5.50 cm      Diastology LVIDs:         4.00 cm      LV e' medial:    4.35 cm/s LV PW:         1.00 cm      LV E/e' medial:  25.3 LV IVS:        1.10 cm      LV e' lateral:   8.05 cm/s LVOT diam:     2.00 cm      LV E/e' lateral: 13.7 LV SV:         52 LV SV Index:   27 LVOT Area:     3.14 cm  LV Volumes (MOD) LV vol d, MOD A2C: 115.0  ml LV vol d, MOD A4C: 119.0 ml LV vol s, MOD A2C: 74.7 ml LV vol s, MOD A4C: 77.0 ml LV  SV MOD A2C:     40.3 ml LV SV MOD A4C:     119.0 ml LV SV MOD BP:      39.8 ml RIGHT VENTRICLE RV S prime:     10.70 cm/s TAPSE (M-mode): 2.4 cm LEFT ATRIUM              Index        RIGHT ATRIUM           Index LA diam:        3.90 cm  2.02 cm/m   RA Area:     20.60 cm LA Vol (A2C):   103.0 ml 53.41 ml/m  RA Volume:   59.30 ml  30.75 ml/m LA Vol (A4C):   90.6 ml  46.96 ml/m LA Biplane Vol: 104.0 ml 53.93 ml/m  AORTIC VALVE LVOT Vmax:   90.20 cm/s LVOT Vmean:  55.900 cm/s LVOT VTI:    0.164 m AI PHT:      525 msec  AORTA Ao Root diam: 3.70 cm MITRAL VALVE                TRICUSPID VALVE MV Area (PHT): 5.13 cm     TR Peak grad:   36.7 mmHg MV Decel Time: 148 msec     TR Vmax:        303.00 cm/s MR Peak grad: 76.7 mmHg MR Mean grad: 45.0 mmHg     SHUNTS MR Vmax:      438.00 cm/s   Systemic VTI:  0.16 m MR Vmean:     313.0 cm/s    Systemic Diam: 2.00 cm MV E velocity: 110.00 cm/s MV A velocity: 105.00 cm/s MV E/A ratio:  1.05 Jenkins Rouge MD Electronically signed by Jenkins Rouge MD Signature Date/Time: 03/13/2022/8:39:31 PM    Final    CT Angio Chest Pulmonary Embolism (PE) W or WO Contrast  Result Date: 03/12/2022 CLINICAL DATA:  Acute respiratory failure and hypoxia. EXAM: CT ANGIOGRAPHY CHEST WITH CONTRAST TECHNIQUE: Multidetector CT imaging of the chest was performed using the standard protocol during bolus administration of intravenous contrast. Multiplanar CT image reconstructions and MIPs were obtained to evaluate the vascular anatomy. RADIATION DOSE REDUCTION: This exam was performed according to the departmental dose-optimization program which includes automated exposure control, adjustment of the mA and/or kV according to patient size and/or use of iterative reconstruction technique. CONTRAST:  63m OMNIPAQUE IOHEXOL 350 MG/ML SOLN COMPARISON:  Chest radiograph dated 03/12/2022 and CT dated 09/18/2021. FINDINGS: Evaluation of this exam is limited due to respiratory motion artifact. Cardiovascular:  Mild cardiomegaly. Coronary vascular calcification of the LAD. No pericardial effusion. There is mild atherosclerotic calcification of the thoracic aorta. No aneurysmal dilatation. No pulmonary artery embolus identified. Mediastinum/Nodes: No hilar or mediastinal adenopathy. The esophagus is grossly unremarkable. No mediastinal fluid collection. Lungs/Pleura: Moderate bilateral pleural effusions with partial compressive atelectasis of the lower lobes versus pneumonia. There is diffuse interstitial and interlobular septal prominence consistent with edema. There is background of emphysema. No pneumothorax. The central airways are patent. Upper Abdomen: No acute abnormality. Musculoskeletal: Osteopenia with degenerative changes of the spine. No acute osseous pathology. Review of the MIP images confirms the above findings. IMPRESSION: 1. No CT evidence of pulmonary artery embolus. 2. Moderate bilateral pleural effusions with partial compressive atelectasis of the lower lobes versus pneumonia. 3. Cardiomegaly with coronary vascular  calcification of the LAD. 4.  Aortic Atherosclerosis (ICD10-I70.0). Electronically Signed   By: Anner Crete M.D.   On: 03/12/2022 20:10   DG Chest 2 View  Result Date: 03/12/2022 CLINICAL DATA:  Shortness of breath with exertion over the past couple of weeks. EXAM: CHEST - 2 VIEW COMPARISON:  09/18/2021 FINDINGS: Lungs are hyperinflated. There are new bilateral pleural effusions. There is significant bibasilar opacity obscuring the hemidiaphragms, consistent with atelectasis or infiltrates. No evidence for pulmonary edema. IMPRESSION: New bilateral pleural effusions and bibasilar atelectasis or infiltrates. Electronically Signed   By: Nolon Nations M.D.   On: 03/12/2022 16:00    Microbiology: Results for orders placed or performed during the hospital encounter of 03/12/22  Resp panel by RT-PCR (RSV, Flu A&B, Covid) Anterior Nasal Swab     Status: None   Collection Time:  03/12/22  5:30 PM   Specimen: Anterior Nasal Swab  Result Value Ref Range Status   SARS Coronavirus 2 by RT PCR NEGATIVE NEGATIVE Final    Comment: (NOTE) SARS-CoV-2 target nucleic acids are NOT DETECTED.  The SARS-CoV-2 RNA is generally detectable in upper respiratory specimens during the acute phase of infection. The lowest concentration of SARS-CoV-2 viral copies this assay can detect is 138 copies/mL. A negative result does not preclude SARS-Cov-2 infection and should not be used as the sole basis for treatment or other patient management decisions. A negative result may occur with  improper specimen collection/handling, submission of specimen other than nasopharyngeal swab, presence of viral mutation(s) within the areas targeted by this assay, and inadequate number of viral copies(<138 copies/mL). A negative result must be combined with clinical observations, patient history, and epidemiological information. The expected result is Negative.  Fact Sheet for Patients:  EntrepreneurPulse.com.au  Fact Sheet for Healthcare Providers:  IncredibleEmployment.be  This test is no t yet approved or cleared by the [place] FDA and  has been authorized for detection and/or diagnosis of SARS-CoV-2 by FDA under an Emergency Use Authorization (EUA). This EUA will remain  in effect (meaning this test can be used) for the duration of the COVID-19 declaration under Section 564(b)(1) of the Act, 21 U.S.C.section 360bbb-3(b)(1), unless the authorization is terminated  or revoked sooner.       Influenza A by PCR NEGATIVE NEGATIVE Final   Influenza B by PCR NEGATIVE NEGATIVE Final    Comment: (NOTE) The Xpert Xpress SARS-CoV-2/FLU/RSV plus assay is intended as an aid in the diagnosis of influenza from Nasopharyngeal swab specimens and should not be used as a sole basis for treatment. Nasal washings and aspirates are unacceptable for Xpert Xpress  SARS-CoV-2/FLU/RSV testing.  Fact Sheet for Patients: EntrepreneurPulse.com.au  Fact Sheet for Healthcare Providers: IncredibleEmployment.be  This test is not yet approved or cleared by the [place] FDA and has been authorized for detection and/or diagnosis of SARS-CoV-2 by FDA under an Emergency Use Authorization (EUA). This EUA will remain in effect (meaning this test can be used) for the duration of the COVID-19 declaration under Section 564(b)(1) of the Act, 21 U.S.C. section 360bbb-3(b)(1), unless the authorization is terminated or revoked.     Resp Syncytial Virus by PCR NEGATIVE NEGATIVE Final    Comment: (NOTE) Fact Sheet for Patients: EntrepreneurPulse.com.au  Fact Sheet for Healthcare Providers: IncredibleEmployment.be  This test is not yet approved or cleared by the [place] FDA and has been authorized for detection and/or diagnosis of SARS-CoV-2 by FDA under an Emergency Use Authorization (EUA). This EUA will remain in effect (meaning  this test can be used) for the duration of the COVID-19 declaration under Section 564(b)(1) of the Act, 21 U.S.C. section 360bbb-3(b)(1), unless the authorization is terminated or revoked.  Performed at [facility], [address] [place], [place] [address]     Labs: CBC: Recent Labs  Lab 03/12/22 1550 03/13/22 0442  WBC 8.1 5.7  HGB 12.4* 11.1*  HCT 39.9 35.9*  MCV 102.6* 101.1*  PLT 105* 94*   Basic Metabolic Panel: Recent Labs  Lab 03/12/22 1753 03/13/22 0442 03/14/22 0526 03/15/22 0617 03/16/22 0700 03/17/22 0420  NA  --  141 141 140 138 140  K  --  3.3* 3.2* 3.4* 3.8 3.7  CL  --  110 107 104 103 104  CO2  --  '24 26 31 28 27  '$ GLUCOSE  --  98 100* 98 97 95  BUN  --  '12 13 16 19 19  '$ CREATININE  --  1.17 1.38* 1.40* 1.35* 1.28*  CALCIUM  --  8.3* 8.8* 8.6* 8.4* 8.6*  MG 1.6*  --  1.7 2.0  --   --    Liver Function Tests: No  results for input(s): "AST", "ALT", "ALKPHOS", "BILITOT", "PROT", "ALBUMIN" in the last 168 hours. CBG: No results for input(s): "GLUCAP" in the last 168 hours.  Discharge time spent: greater than 30 minutes.  Signed: Orson Eva, MD [facility] 03/17/2022

## 2022-03-18 ENCOUNTER — Telehealth: Payer: Self-pay | Admitting: *Deleted

## 2022-03-18 ENCOUNTER — Encounter: Payer: Self-pay | Admitting: *Deleted

## 2022-03-18 NOTE — Patient Outreach (Signed)
  Care Coordination TOC Note Transition Care Management Follow-up Telephone Call Date of discharge and from where: [facility] on 03/17/22 How have you been since you were released from the hospital? "Feeling better a lot better" Any questions or concerns? No  Items Reviewed: Did the pt receive and understand the discharge instructions provided? Yes  Medications obtained and verified? Yes . Reviewed medication changes. Provided with [facility] telephone number in case daughter, Ava, needs to call back and clarify changes. Advised that these meds were sent to [facility]. They normally deliver.  Other? Yes . Encouraged deep breathing exercises Any new allergies since your discharge? No  Dietary orders reviewed? Yes Do you have support at home? Yes   Home Care and Equipment/Supplies: Were home health services ordered? no If so, what is the name of the agency?   Has the agency set up a time to come to the patient's home? not applicable Were any new equipment or medical supplies ordered?  No What is the name of the medical supply agency?  Were you able to get the supplies/equipment? not applicable Do you have any questions related to the use of the equipment or supplies? No  Functional Questionnaire: (I = Independent and D = Dependent) ADLs: I  Bathing/Dressing- I  Meal Prep- I  Eating- I  Maintaining continence- I  Transferring/Ambulation- I  Managing Meds- D  Follow up appointments reviewed:  PCP Hospital f/u appt confirmed? Yes  Scheduled to see Dr Wolfgang Phoenix on 03/26/22 at 11:20 [place] [facility] f/u appt confirmed? Yes  Scheduled to see Ermalinda Barrios, PA-C(cardio) on 04/07/22 at 2:00 Are transportation arrangements needed? No  If their condition worsens, is the pt aware to call PCP or go to the Emergency Dept.? Yes Was the patient provided with contact information for the PCP's office or ED? Yes Was to pt encouraged to call back with questions or concerns? Yes  SDOH  assessments and interventions completed:   Yes SDOH Interventions Today    Flowsheet Row Most Recent Value  SDOH Interventions   Transportation Interventions Intervention Not Indicated  Financial Strain Interventions Intervention Not Indicated       Care Coordination Interventions:  Staff message sent to [facility] regarding existing appt with Dr Wolfgang Phoenix on 03/26/22 and whether it will be appropriate for this hosp f/u.    Encounter Outcome:  Pt. Visit Completed    Chong Sicilian, BSN, RN-BC RN Care Coordinator [place] Direct Dial: [phone] Main #: [phone]

## 2022-03-20 ENCOUNTER — Ambulatory Visit: Payer: Self-pay | Admitting: *Deleted

## 2022-03-20 ENCOUNTER — Encounter: Payer: Self-pay | Admitting: *Deleted

## 2022-03-20 NOTE — Patient Outreach (Signed)
  Care Coordination   Follow Up Visit Note   03/20/2022 Name: [name] MRN: [id number] DOB: [birth year]  [name] is a 89 y.o. year old male who sees Luking, Elayne Snare, MD for primary care. I  spoke with daughter, [name], by telephone today.  What matters to the patients health and wellness today?  Managing medications and resolving pneumonia    Goals Addressed             This Visit's Progress    Care Coordination Services (no follow-up needed)       Care Coordination Interventions: Evaluation of current treatment plan related to recent hospitalization for bilateral lower lobe pneumonia and patient's adherence to plan as established by provider Reviewed discharge medications with daughter, [name], and instructions to d/c potassium due to addition of spironolactone. Verified that she was able to pickup ASA 81 chewable, furosemide, spironolactone, and losartan.  Reviewed upcoming appointment with Dr Wolfgang Phoenix for 03/26/22. Previously sent a staff message to [facility] regarding existing appt with Dr Wolfgang Phoenix on 03/26/22 and whether it will be appropriate for this hosp f/u. Appt is still scheduled and neither I nor the patient/daughter has heard about any changes to that appt. Verified transportation.   Advised that they will need to repeat BMP and CBC in one week at that PCP appt Assessed for any additional resource or care coordination needs. None identified at this time.         SDOH assessments and interventions completed:  Yes  SDOH Interventions Today    Flowsheet Row Most Recent Value  SDOH Interventions   Housing Interventions Intervention Not Indicated  Transportation Interventions Intervention Not Indicated        Care Coordination Interventions:  Yes, provided   Follow up plan: No further intervention required.   Encounter Outcome:  Pt. Visit Completed   Chong Sicilian, BSN, RN-BC RN Care Coordinator [place] Direct  Dial: [phone] Main #: [phone]

## 2022-03-24 NOTE — Progress Notes (Signed)
Cardiology Office Note:    Date:  04/06/2022   ID:  [name], DOB 03/15/1926, MRN [id number]  PCP:  [name], MD  [facility] Cardiologist:  Dorris Carnes, MD     Referring MD: [name], MD   Chief Complaint:  No chief complaint on file.     History of Present Illness:   [name] is a 89 y.o. male with  history of HTN, HLD, chest pain with normal NST 2018.   He was sent to ED by PCP for evaluation of worsening SOB. Echo 03/13/22 LVEF 30-35% global HK. Treated with IV lasix and weight down. Had bradycardia 40's at night so coreg stopped. Flat troponins 83,85. He had stopped his diuretic a year ago and had gradual increase edema and dyspnea. He was restarted on lasix, losartan added. He had deep TWI on EKG and intermittent LBBB. Plan was to treat medically and re-evaluate as OP. CT showed coronary calcification in the LAD distribution.   Patient comes in with his daughter. He gets short of breaht when moving around his house not daily. Denies chest pain. Sometimes leg swelling but watching salt closely. Crt 1.29 04/03/22.        Past Medical History:  Diagnosis Date   Asthma    Diverticulosis    GERD (gastroesophageal reflux disease)    Gout    Heart murmur    Evaluation by a cardiologist years ago was reportedly negative   HTN (hypertension)    Osteoarthritis    Reflux    Current Medications: Current Meds  Medication Sig   acetaminophen (TYLENOL) 650 MG CR tablet Take 650-1,300 mg by mouth every 8 (eight) hours as needed for pain.   aspirin 81 MG chewable tablet Chew 1 tablet (81 mg total) by mouth daily.   dapagliflozin propanediol (FARXIGA) 10 MG TABS tablet Take 1 tablet (10 mg total) by mouth daily.   furosemide (LASIX) 20 MG tablet Take 1 tablet (20 mg total) by mouth daily.   losartan (COZAAR) 25 MG tablet Take 1 tablet (25 mg total) by mouth daily.   rosuvastatin (CRESTOR) 20 MG tablet Take 1 tablet (20 mg total)  by mouth daily.   spironolactone (ALDACTONE) 25 MG tablet Take 0.5 tablets (12.5 mg total) by mouth daily.    Allergies:   Penicillin g benzathine and Penicillins   Social History   Tobacco Use   Smoking status: Former    Types: Pipe    Start date: 12/04/1939    Quit date: 1970    Years since quitting: 54.0   Smokeless tobacco: Never   Tobacco comments:    quit smoking cigarettes in 1971  Vaping Use   Vaping Use: Never used  Substance Use Topics   Alcohol use: No   Drug use: No    Family Hx: The patient's family history includes Arthritis in an other family member; Cancer in his brother; Heart attack in his father; Heart disease in an other family member; Hypertension in his father and mother; Uterine cancer in his maternal aunt.  ROS     Physical Exam:    VS:  BP 138/86   Pulse 60   Ht '5\' 10"'$  (1.778 m)   Wt 162 lb 12.8 oz (73.8 kg)   SpO2 91%   BMI 23.36 kg/m     Wt Readings from Last 3 Encounters:  04/06/22 162 lb 12.8 oz (73.8 kg)  03/26/22 163 lb 3.2 oz (74 kg)  03/17/22  166 lb 7.2 oz (75.5 kg)    Physical Exam  GEN: Thin, elderly, in no acute distress  Neck: no JVD, carotid bruits, or masses Cardiac:RRR; no murmurs, rubs, or gallops  Respiratory:  decreased breath sounds but clear to auscultation bilaterally, normal work of breathing GI: soft, nontender, nondistended, + BS Ext: without cyanosis, clubbing, or edema, Good distal pulses bilaterally Neuro:  Alert and Oriented x 3,  Psych: euthymic mood, full affect        EKGs/Labs/Other Test Reviewed:    EKG:  EKG is  not ordered today.     Recent Labs: 09/18/2021: ALT 13 03/13/2022: Hemoglobin 11.1; Platelets 94; TSH 3.511 03/15/2022: Magnesium 2.0 04/03/2022: BNP 186.9; BUN 9; Creatinine, Ser 1.29; Potassium 4.1; Sodium 143   Recent Lipid Panel No results for input(s): "CHOL", "TRIG", "HDL", "VLDL", "LDLCALC", "LDLDIRECT" in the last 8760 hours.   Prior CV Studies:    Echo  03/13/22 IMPRESSIONS Left ventricular ejection fraction, by estimation, is 30 to 35%. The left ventricle has moderately decreased function. The left ventricle demonstrates global hypokinesis. The left ventricular internal cavity size was moderately dilated. Left ventricular diastolic parameters were normal. 1. Right ventricular systolic function is mildly reduced. The right ventricular size is mildly enlarged. There is mildly elevated pulmonary artery systolic pressure. 2. 3. Left atrial size was mildly dilated. 4. Right atrial size was mildly dilated. The mitral valve is normal in structure. Mild mitral valve regurgitation. No evidence of mitral stenosis. 5. 6. Tricuspid valve regurgitation is moderate. The aortic valve is tricuspid. There is mild calcification of the aortic valve. Aortic valve regurgitation is mild. Aortic valve sclerosis is present, with no evidence of aortic valve stenosis. 7. The inferior vena cava is dilated in size with <50% respiratory variability, suggesting right atrial pressure of 15 mmHg. 8. FINDINGS Left Ventricle: Left ventricular ejection fraction, by estimation, is 30 to 35%. The left ventricle has moderately decreased function. The left ventricle demonstrates global hypokinesis. The left ventricular internal cavity size was moderately dilated. There is no left ventricular hypertrophy. Left ventricular diastolic parameters were normal. Final     Risk Assessment/Calculations/Metrics:              ASSESSMENT & PLAN:   No problem-specific Assessment & Plan notes found for this encounter.     New CM LVEF 30-35% with global HK on echo 03/15/22.   Can't tolerate BB due to bradycardia. Currently on lasix 20 mg daily, losartan 25 mg daily(new), spiro 12.5 mg daily, farxiga. Crt improving 1.29 04/03/22.  2 gm sodium diet discussed. Helene Kelp with DOE with some walking around his house but No  chest pain. Deep TWI on EKG and intermittent LBBB. Would  hold off on ischemic work up at this time unless he exhibits symptoms. Continue current meds.   Sinus bradycardia 39/m now improved off coreg.   Elevated but flat troponins most likely due to demand ischemia. CAD in LAD distribution on CT-consevative management   HLD on crestor             Dispo:  No follow-ups on file.   Medication Adjustments/Labs and Tests Ordered: Current medicines are reviewed at length with the patient today.  Concerns regarding medicines are outlined above.  Tests Ordered: No orders of the defined types were placed in this encounter.  Medication Changes: No orders of the defined types were placed in this encounter.  Sumner Boast, PA-C  04/06/2022 1:55 PM    [facility]  [address], [place], [place]  [address] Phone: [phone]; Fax: [phone]

## 2022-03-26 ENCOUNTER — Ambulatory Visit (INDEPENDENT_AMBULATORY_CARE_PROVIDER_SITE_OTHER): Payer: Medicare HMO | Admitting: Family Medicine

## 2022-03-26 ENCOUNTER — Encounter: Payer: Self-pay | Admitting: Family Medicine

## 2022-03-26 VITALS — BP 132/78 | Wt 163.2 lb

## 2022-03-26 DIAGNOSIS — J9601 Acute respiratory failure with hypoxia: Secondary | ICD-10-CM | POA: Diagnosis not present

## 2022-03-26 DIAGNOSIS — D696 Thrombocytopenia, unspecified: Secondary | ICD-10-CM

## 2022-03-26 DIAGNOSIS — I5022 Chronic systolic (congestive) heart failure: Secondary | ICD-10-CM

## 2022-03-26 DIAGNOSIS — I77819 Aortic ectasia, unspecified site: Secondary | ICD-10-CM

## 2022-03-26 MED ORDER — FUROSEMIDE 20 MG PO TABS: 20.0000 mg | ORAL_TABLET | Freq: Every day | ORAL | 3 refills | Status: DC

## 2022-03-26 NOTE — Patient Instructions (Addendum)
Mr. Merrow  It is very important to weigh yourself every day If your weight starts going up 3 to 5 pounds over several days please let us know.  As for your medicines continue your current medicine but make sure you are also taking the furosemide  In 7 days please do your blood work  Please follow-up here within 3 weeks  Follow-up sooner problems TakeCare-Dr. Nicki Reaper

## 2022-03-26 NOTE — Progress Notes (Signed)
   Subjective:    Patient ID: [name], male    DOB: June 28, 1925, 89 y.o.   MRN: [id number]  HPI Pt arrives for hospital follow up. Pt went to [facility] due to unable to breath. Pt states breathing is better today.  Notes from hospitalization was reviewed Labs reviewed X-rays reviewed I looked over the medications as well He does not have furosemide within his medicine For some reason it is just not there His weight is down   Review of Systems     Objective:   Physical Exam  Does have some edema in the legs heart Regular No signs of CHF on physical exam      Assessment & Plan:  With a history of CHF we went over all of his medicines also make sure that he is going to be taking his Lasix.  In addition to this to follow-up within just a few weeks Check lab work in 1 week Warning signs regarding progression of CHF was discussed in detail no sign of any setbacks currently To follow-up if worse

## 2022-04-01 ENCOUNTER — Other Ambulatory Visit: Payer: Self-pay

## 2022-04-01 DIAGNOSIS — I10 Essential (primary) hypertension: Secondary | ICD-10-CM

## 2022-04-01 DIAGNOSIS — I5022 Chronic systolic (congestive) heart failure: Secondary | ICD-10-CM

## 2022-04-03 DIAGNOSIS — I10 Essential (primary) hypertension: Secondary | ICD-10-CM | POA: Diagnosis not present

## 2022-04-03 DIAGNOSIS — I5022 Chronic systolic (congestive) heart failure: Secondary | ICD-10-CM | POA: Diagnosis not present

## 2022-04-04 LAB — BASIC METABOLIC PANEL (7)
BUN/Creatinine Ratio: 7 — ABNORMAL LOW (ref 10–24)
BUN: 9 mg/dL — ABNORMAL LOW (ref 10–36)
CO2: 22 mmol/L (ref 20–29)
Chloride: 106 mmol/L (ref 96–106)
Creatinine, Ser: 1.29 mg/dL — ABNORMAL HIGH (ref 0.76–1.27)
Glucose: 90 mg/dL (ref 70–99)
Potassium: 4.1 mmol/L (ref 3.5–5.2)
Sodium: 143 mmol/L (ref 134–144)
eGFR: 51 mL/min/{1.73_m2} — ABNORMAL LOW (ref >59)

## 2022-04-04 LAB — BRAIN NATRIURETIC PEPTIDE: BNP: 186.9 pg/mL — ABNORMAL HIGH (ref 0.0–100.0)

## 2022-04-06 ENCOUNTER — Encounter: Payer: Self-pay | Admitting: Physician Assistant

## 2022-04-06 ENCOUNTER — Ambulatory Visit: Payer: Medicare HMO | Attending: Physician Assistant | Admitting: Physician Assistant

## 2022-04-06 VITALS — BP 138/86 | HR 60 | Ht 70.0 in | Wt 162.8 lb

## 2022-04-06 DIAGNOSIS — R001 Bradycardia, unspecified: Secondary | ICD-10-CM

## 2022-04-06 DIAGNOSIS — E785 Hyperlipidemia, unspecified: Secondary | ICD-10-CM | POA: Diagnosis not present

## 2022-04-06 DIAGNOSIS — I2510 Atherosclerotic heart disease of native coronary artery without angina pectoris: Secondary | ICD-10-CM

## 2022-04-06 DIAGNOSIS — I5020 Unspecified systolic (congestive) heart failure: Secondary | ICD-10-CM | POA: Diagnosis not present

## 2022-04-06 NOTE — Patient Instructions (Signed)
Medication Instructions:  Your physician recommends that you continue on your current medications as directed. Please refer to the Current Medication list given to you today.  *If you need a refill on your cardiac medications before your next appointment, please call your pharmacy*   Lab Work: NONE   If you have labs (blood work) drawn today and your tests are completely normal, you will receive your results only by: [place] (if you have MyChart) OR A paper copy in the mail If you have any lab test that is abnormal or we need to change your treatment, we will call you to review the results.   Testing/Procedures: NONE    Follow-Up: At [facility], you and your health needs are our priority.  As part of our continuing mission to provide you with exceptional heart care, we have created designated Provider Care Teams.  These Care Teams include your primary Cardiologist (physician) and Advanced Practice Providers (APPs -  Physician Assistants and Nurse Practitioners) who all work together to provide you with the care you need, when you need it.  We recommend signing up for the patient portal called "MyChart".  Sign up information is provided on this After Visit Summary.  MyChart is used to connect with patients for Virtual Visits (Telemedicine).  Patients are able to view lab/test results, encounter notes, upcoming appointments, etc.  Non-urgent messages can be sent to your provider as well.   To learn more about what you can do with MyChart, go to [email or web address].    Your next appointment:   4 month(s)  Provider:   Dorris Carnes, MD    Other Instructions Thank you for choosing [facility]!

## 2022-04-07 ENCOUNTER — Ambulatory Visit: Payer: Medicare HMO | Admitting: Physician Assistant

## 2022-04-09 ENCOUNTER — Telehealth: Payer: Self-pay | Admitting: Internal Medicine

## 2022-04-09 NOTE — Telephone Encounter (Signed)
Daughter is calling to see what the patient can take for headaches. Please advise

## 2022-04-09 NOTE — Telephone Encounter (Signed)
Spoke to patients daughter who stated that patient started feeling light headed this morning, but this has happened before. Patient has not started any new medications recently. Patient denies cp,sob,dizziness.   Please advise.

## 2022-04-10 NOTE — Telephone Encounter (Signed)
Patient has appt with Gerrianne Scale, PA-C on 2/12 for dizziness. Pt's daughter notified and verbalized understanding. Patient also has appt with PCP in February .

## 2022-04-10 NOTE — Telephone Encounter (Signed)
I saw the pt in 2018    The pt was seen by [facility] when he was in the hospital this year  Would need to have follow up to make further comments re dizzienss   Set follow up in cardiology (anyone at this point0   Or, he should contact Dr Wolfgang Phoenix

## 2022-04-13 DIAGNOSIS — B351 Tinea unguium: Secondary | ICD-10-CM | POA: Diagnosis not present

## 2022-04-13 DIAGNOSIS — I70203 Unspecified atherosclerosis of native arteries of extremities, bilateral legs: Secondary | ICD-10-CM | POA: Diagnosis not present

## 2022-04-13 DIAGNOSIS — I739 Peripheral vascular disease, unspecified: Secondary | ICD-10-CM | POA: Diagnosis not present

## 2022-04-13 DIAGNOSIS — M19071 Primary osteoarthritis, right ankle and foot: Secondary | ICD-10-CM | POA: Diagnosis not present

## 2022-04-13 DIAGNOSIS — M19072 Primary osteoarthritis, left ankle and foot: Secondary | ICD-10-CM | POA: Diagnosis not present

## 2022-04-13 DIAGNOSIS — L84 Corns and callosities: Secondary | ICD-10-CM | POA: Diagnosis not present

## 2022-04-17 ENCOUNTER — Ambulatory Visit (INDEPENDENT_AMBULATORY_CARE_PROVIDER_SITE_OTHER): Payer: Medicare HMO | Admitting: Family Medicine

## 2022-04-17 ENCOUNTER — Encounter: Payer: Self-pay | Admitting: Family Medicine

## 2022-04-17 VITALS — BP 135/68 | Wt 162.0 lb

## 2022-04-17 DIAGNOSIS — I10 Essential (primary) hypertension: Secondary | ICD-10-CM | POA: Diagnosis not present

## 2022-04-17 DIAGNOSIS — R600 Localized edema: Secondary | ICD-10-CM

## 2022-04-17 DIAGNOSIS — I5022 Chronic systolic (congestive) heart failure: Secondary | ICD-10-CM | POA: Diagnosis not present

## 2022-04-17 NOTE — Progress Notes (Signed)
   Subjective:    Patient ID: [name], [name]    DOB: [birth year], 89 y.o.   MRN: [id number]  HPI Pt arrives for follow up. Pt having some aches in left arm. Swelling in legs has improved.  Medications were reviewed with the patient Pt states h e does still have times where he feels tired History of CHF  Review of Systems     Objective:   Physical Exam  General-in no acute distress Eyes-no discharge Lungs-respiratory rate normal, CTA CV-no murmurs,RRR Extremities skin warm dry some edema not pitting mainly lower legs much better than what it was blood pressure rechecked sitting and standing reasonable Neuro grossly normal Behavior normal, alert       Assessment & Plan:  CHF stay able HTN stable Pedal edema stable Medications reviewed No labs today Previous labs reviewed with patient Follow-up within 5 to 6 weeks

## 2022-04-27 ENCOUNTER — Ambulatory Visit: Payer: Medicare HMO | Attending: Physician Assistant | Admitting: Physician Assistant

## 2022-04-27 ENCOUNTER — Encounter: Payer: Self-pay | Admitting: Physician Assistant

## 2022-04-27 VITALS — BP 124/78 | HR 80 | Ht 67.0 in | Wt 165.0 lb

## 2022-04-27 DIAGNOSIS — E782 Mixed hyperlipidemia: Secondary | ICD-10-CM | POA: Diagnosis not present

## 2022-04-27 DIAGNOSIS — I2510 Atherosclerotic heart disease of native coronary artery without angina pectoris: Secondary | ICD-10-CM | POA: Diagnosis not present

## 2022-04-27 DIAGNOSIS — R001 Bradycardia, unspecified: Secondary | ICD-10-CM | POA: Diagnosis not present

## 2022-04-27 DIAGNOSIS — I5020 Unspecified systolic (congestive) heart failure: Secondary | ICD-10-CM

## 2022-04-27 NOTE — Progress Notes (Signed)
Cardiology Office Note:    Date:  04/27/2022   ID:  [name], DOB 09-23-1925, MRN [id number]  PCP:  [name], MD  [facility] Cardiologist:  Dorris Carnes, MD     Referring MD: [name], MD   Chief Complaint:  Follow-up     History of Present Illness:   [name] is a 89 y.o. male with     history of HTN, HLD, chest pain with normal NST 2018.    He was sent to ED by PCP for evaluation of worsening SOB. Echo 03/13/22 LVEF 30-35% global HK. Treated with IV lasix and weight down. Had bradycardia 40's at night so coreg stopped. Flat troponins 83,85. He had stopped his diuretic a year ago and had gradual increase edema and dyspnea. He was restarted on lasix, losartan added. He had deep TWI on EKG and intermittent LBBB. Plan was to treat medically and re-evaluate as OP. CT showed coronary calcification in the LAD distribution.   I saw the patient 04/06/22 with increased DOE and some leg swelling.  Crt 1.29. I continued same meds. He saw Dr. Wolfgang Phoenix 04/19/22 with shoulder pain.. Gets SOB if walks too much but relieved with inhaler-has asthma.  He denies chest tightness.       Past Medical History:  Diagnosis Date   Asthma    Diverticulosis    GERD (gastroesophageal reflux disease)    Gout    Heart murmur    Evaluation by a cardiologist years ago was reportedly negative   HTN (hypertension)    Osteoarthritis    Reflux    Current Medications: Current Meds  Medication Sig   acetaminophen (TYLENOL) 650 MG CR tablet Take 650-1,300 mg by mouth every 8 (eight) hours as needed for pain.   aspirin 81 MG chewable tablet Chew 1 tablet (81 mg total) by mouth daily.   dapagliflozin propanediol (FARXIGA) 10 MG TABS tablet Take 1 tablet (10 mg total) by mouth daily.   furosemide (LASIX) 20 MG tablet Take 1 tablet (20 mg total) by mouth daily.   losartan (COZAAR) 25 MG tablet Take 1 tablet (25 mg total) by mouth daily.   rosuvastatin (CRESTOR) 20 MG  tablet Take 1 tablet (20 mg total) by mouth daily.   spironolactone (ALDACTONE) 25 MG tablet Take 0.5 tablets (12.5 mg total) by mouth daily.    Allergies:   Penicillin g benzathine and Penicillins   Social History   Tobacco Use   Smoking status: Former    Types: Pipe    Start date: 12/04/1939    Quit date: 1970    Years since quitting: 54.1   Smokeless tobacco: Never   Tobacco comments:    quit smoking cigarettes in 1971  Vaping Use   Vaping Use: Never used  Substance Use Topics   Alcohol use: No   Drug use: No    Family Hx: The patient's family history includes Arthritis in an other family member; Cancer in his brother; Heart attack in his father; Heart disease in an other family member; Hypertension in his father and mother; Uterine cancer in his maternal aunt.  ROS     Physical Exam:    VS:  BP 124/78   Pulse 80   Ht '5\' 7"'$  (1.702 m)   Wt 165 lb (74.8 kg)   SpO2 98%   BMI 25.84 kg/m     Wt Readings from Last 3 Encounters:  04/27/22 165 lb (74.8 kg)  04/17/22 162  lb (73.5 kg)  04/06/22 162 lb 12.8 oz (73.8 kg)    Physical Exam  GEN: Well nourished, well developed, in no acute distress  Neck: no JVD, carotid bruits, or masses Cardiac:RRR; some skipping Respiratory:  clear to auscultation bilaterally, normal work of breathing GI: soft, nontender, nondistended, + BS Ext: without cyanosis, clubbing, or edema, Good distal pulses bilaterally Neuro:  Alert and Oriented x 3,  Psych: euthymic mood, full affect        EKGs/Labs/Other Test Reviewed:    EKG:  EKG is not ordered today.     Recent Labs: 09/18/2021: ALT 13 03/13/2022: Hemoglobin 11.1; Platelets 94; TSH 3.511 03/15/2022: Magnesium 2.0 04/03/2022: BNP 186.9; BUN 9; Creatinine, Ser 1.29; Potassium 4.1; Sodium 143   Recent Lipid Panel No results for input(s): "CHOL", "TRIG", "HDL", "VLDL", "LDLCALC", "LDLDIRECT" in the last 8760 hours.   Prior CV Studies:    Echo 03/13/22 IMPRESSIONS Left  ventricular ejection fraction, by estimation, is 30 to 35%. The left ventricle has moderately decreased function. The left ventricle demonstrates global hypokinesis. The left ventricular internal cavity size was moderately dilated. Left ventricular diastolic parameters were normal. 1. Right ventricular systolic function is mildly reduced. The right ventricular size is mildly enlarged. There is mildly elevated pulmonary artery systolic pressure. 2. 3. Left atrial size was mildly dilated. 4. Right atrial size was mildly dilated. The mitral valve is normal in structure. Mild mitral valve regurgitation. No evidence of mitral stenosis. 5. 6. Tricuspid valve regurgitation is moderate. The aortic valve is tricuspid. There is mild calcification of the aortic valve. Aortic valve regurgitation is mild. Aortic valve sclerosis is present, with no evidence of aortic valve stenosis. 7. The inferior vena cava is dilated in size with <50% respiratory variability, suggesting right atrial pressure of 15 mmHg. 8. FINDINGS Left Ventricle: Left ventricular ejection fraction, by estimation, is 30 to 35%. The left ventricle has moderately decreased function. The left ventricle demonstrates global hypokinesis. The left ventricular internal cavity size was moderately dilated. There is no left ventricular hypertrophy. Left ventricular diastolic parameters were normal. Final     Risk Assessment/Calculations/Metrics:              ASSESSMENT & PLAN:   No problem-specific Assessment & Plan notes found for this encounter.    HFrEF-New CM LVEF 30-35% with global HK on echo 03/15/22.   Can't tolerate BB due to bradycardia. Currently on lasix 20 mg daily, losartan 25 mg daily, spiro 12.5 mg daily, farxiga. Crt improving 1.29 04/03/22.  2 gm sodium diet discussed.  Some DOE with some walking around his house relieved with inhalers-has asthma, No  chest pain. Deep TWI on EKG and intermittent LBBB. Would hold off  on ischemic work up at this time unless he exhibits symptoms. Continue current meds.   Sinus bradycardia 39/m now improved off coreg.   Elevated but flat troponins most likely due to demand ischemia. CAD in LAD distribution on CT-consevative management   HLD on crestor                 Dispo:  No follow-ups on file.   Medication Adjustments/Labs and Tests Ordered: Current medicines are reviewed at length with the patient today.  Concerns regarding medicines are outlined above.  Tests Ordered: No orders of the defined types were placed in this encounter.  Medication Changes: No orders of the defined types were placed in this encounter.  Signed, Ermalinda Barrios, PA-C  04/27/2022 1:21 PM    [facility]  [address], [place], [place]  [address] Phone: [phone]; Fax: [phone]

## 2022-04-27 NOTE — Patient Instructions (Signed)
Medication Instructions:  Your physician recommends that you continue on your current medications as directed. Please refer to the Current Medication list given to you today.   Labwork: None today  Testing/Procedures: None today  Follow-Up: 3 months Dr.Ross  Any Other Special Instructions Will Be Listed Below (If Applicable).  If you need a refill on your cardiac medications before your next appointment, please call your pharmacy.

## 2022-05-14 ENCOUNTER — Encounter: Payer: Self-pay | Admitting: Radiology

## 2022-05-15 ENCOUNTER — Other Ambulatory Visit: Payer: Self-pay | Admitting: Family Medicine

## 2022-05-18 ENCOUNTER — Telehealth: Payer: Self-pay | Admitting: Family Medicine

## 2022-05-18 ENCOUNTER — Other Ambulatory Visit: Payer: Self-pay | Admitting: Family Medicine

## 2022-05-18 MED ORDER — LOSARTAN POTASSIUM 25 MG PO TABS: 25.0000 mg | ORAL_TABLET | Freq: Every day | ORAL | 1 refills | Status: DC

## 2022-05-18 NOTE — Telephone Encounter (Signed)
May have a prescription 1 tablet daily, #30, 4 refills  Please encourage patient to bring all of his medications with him to his office visit on the eighth thank you

## 2022-05-18 NOTE — Telephone Encounter (Signed)
Patient needing refill losartan-potassium 25 mg it was prescribe to him while in hospital 03/12/22  by Dr. Carles Collet  send to [facility] Patient is completely out.

## 2022-05-18 NOTE — Telephone Encounter (Signed)
Prescription sent electronically to pharmacy. Patient notified. 

## 2022-05-22 ENCOUNTER — Ambulatory Visit (INDEPENDENT_AMBULATORY_CARE_PROVIDER_SITE_OTHER): Payer: Medicare HMO | Admitting: Family Medicine

## 2022-05-22 VITALS — BP 132/84 | HR 90 | Wt 166.0 lb

## 2022-05-22 DIAGNOSIS — R0609 Other forms of dyspnea: Secondary | ICD-10-CM

## 2022-05-22 DIAGNOSIS — N1832 Chronic kidney disease, stage 3b: Secondary | ICD-10-CM

## 2022-05-22 DIAGNOSIS — I10 Essential (primary) hypertension: Secondary | ICD-10-CM | POA: Diagnosis not present

## 2022-05-22 DIAGNOSIS — R600 Localized edema: Secondary | ICD-10-CM | POA: Diagnosis not present

## 2022-05-22 DIAGNOSIS — I5022 Chronic systolic (congestive) heart failure: Secondary | ICD-10-CM

## 2022-05-22 DIAGNOSIS — E7849 Other hyperlipidemia: Secondary | ICD-10-CM

## 2022-05-22 MED ORDER — DAPAGLIFLOZIN PROPANEDIOL 10 MG PO TABS: 10.0000 mg | ORAL_TABLET | Freq: Every day | ORAL | 5 refills | Status: DC

## 2022-05-22 MED ORDER — FUROSEMIDE 20 MG PO TABS: 20.0000 mg | ORAL_TABLET | Freq: Every day | ORAL | 1 refills | Status: DC

## 2022-05-22 MED ORDER — ROSUVASTATIN CALCIUM 20 MG PO TABS: 20.0000 mg | ORAL_TABLET | Freq: Every day | ORAL | 1 refills | Status: DC

## 2022-05-22 MED ORDER — LOSARTAN POTASSIUM 25 MG PO TABS: 25.0000 mg | ORAL_TABLET | Freq: Every day | ORAL | 1 refills | Status: DC

## 2022-05-22 MED ORDER — SPIRONOLACTONE 25 MG PO TABS: 12.5000 mg | ORAL_TABLET | Freq: Every day | ORAL | 2 refills | Status: DC

## 2022-05-22 NOTE — Progress Notes (Unsigned)
   Subjective:    Patient ID: [name], male    DOB: Nov 14, 1925, 89 y.o.   MRN: [id number]  HPI Patient arrives today for 5 week follow up. He has a history of CHF history of heart failure and history of having a hard time remembering his medicines he gets some confused his family does try to help him out by put him in pillboxes.  We reviewed over all of his medicines today and wrote out a medicine list for him   Review of Systems     Objective:   Physical Exam General-in no acute distress Eyes-no discharge Lungs-respiratory rate normal, CTA CV-no murmurs,RRR Extremities skin warm dry slight edema Neuro grossly normal Behavior normal, alert        Assessment & Plan:  1. Pedal edema Furosemide along with potassium each morning  2. Essential hypertension Blood pressure decent control continue current measures  3. Chronic HFrEF (heart failure with reduced ejection fraction) (HCC) Heart failure continue current measures.  Check lab work.  Await results - Basic Metabolic Panel - Magnesium - Phosphorus  4. DOE (dyspnea on exertion) This is related to age and heart failure  5. Stage 3b chronic kidney disease ([place]) Kidney function stable repeat labs before next visit  6. Other hyperlipidemia Continue cholesterol medicine because of coronary calcification - Lipid panel  Follow-up in approximately 2 to 3 months follow-up sooner problems

## 2022-05-28 IMAGING — CT CT HEAD W/O CM
3 of 4 series · 13 of 47 positions shown, 15 images · non-contrast
Comparison: MRI head 11/09/2019, CT cervical spine 11/13/2019

CLINICAL DATA: Fall [REDACTED] and again today, denies head strike but
pain with pain at the base of skull and posterior neck

EXAM:
CT HEAD WITHOUT CONTRAST
CT CERVICAL SPINE WITHOUT CONTRAST
TECHNIQUE: Multidetector CT imaging of the head and cervical spine was
performed following the standard protocol without intravenous
contrast. Multiplanar CT image reconstructions of the cervical spine
were also generated.

[Series 2: head w o · axial · 0.41mm/px · z∈[+25,+145]mm · 7 of 33 slices shown, 9 images]
[im 5/33  brain]
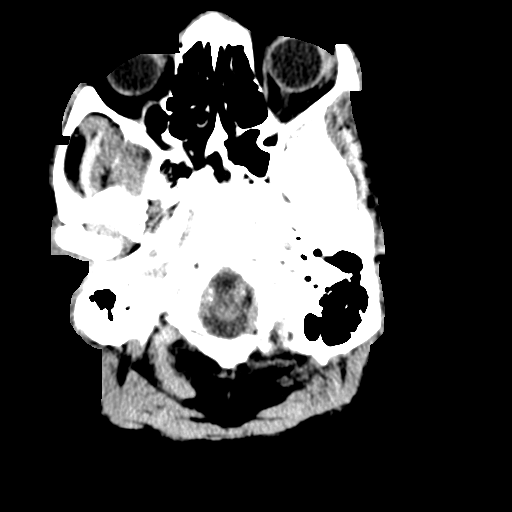
[im 5/33  bone]
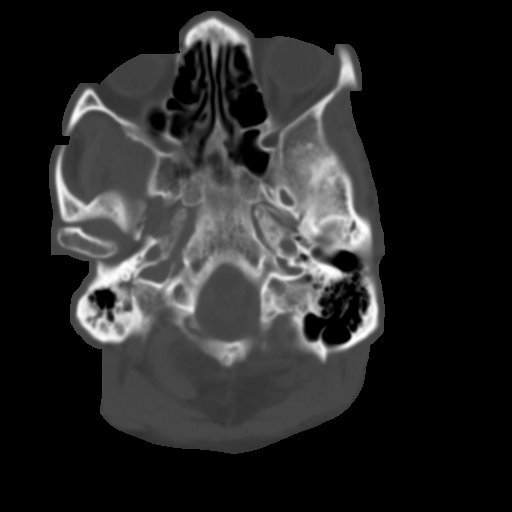
[im 9/33  brain]
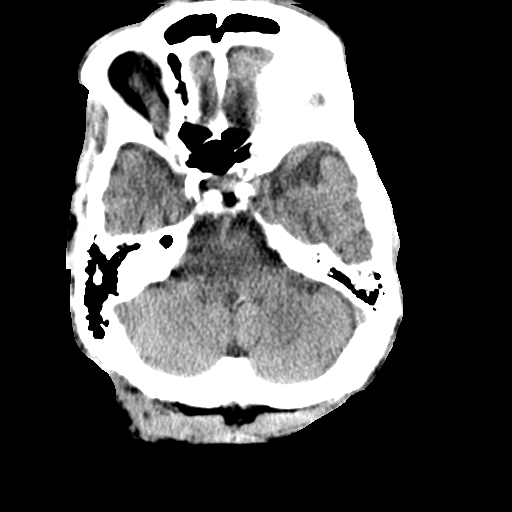
[im 13/33  brain]
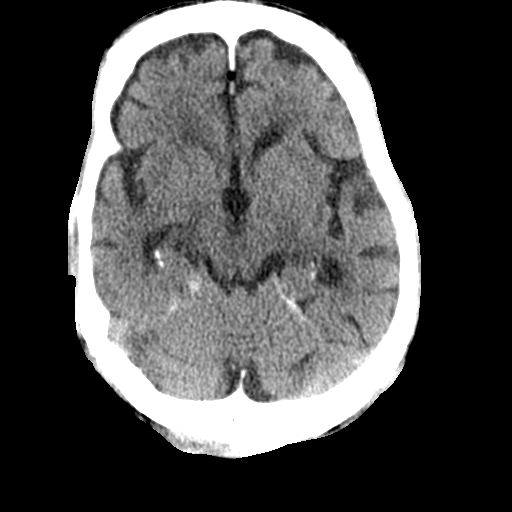
[im 17/33  brain]
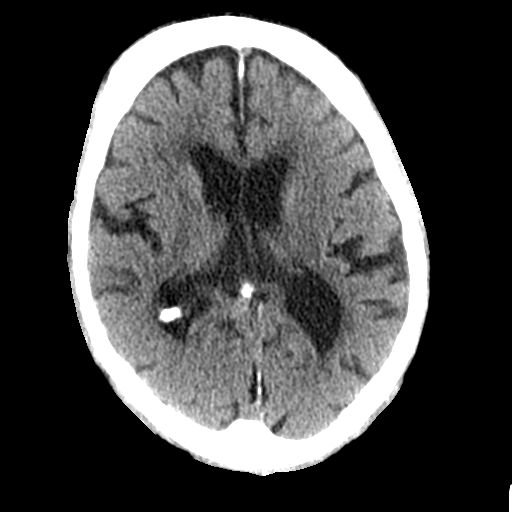
[im 21/33  brain]
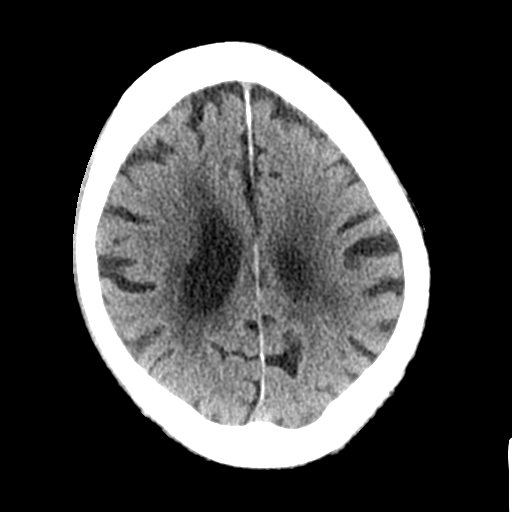
[im 21/33  bone]
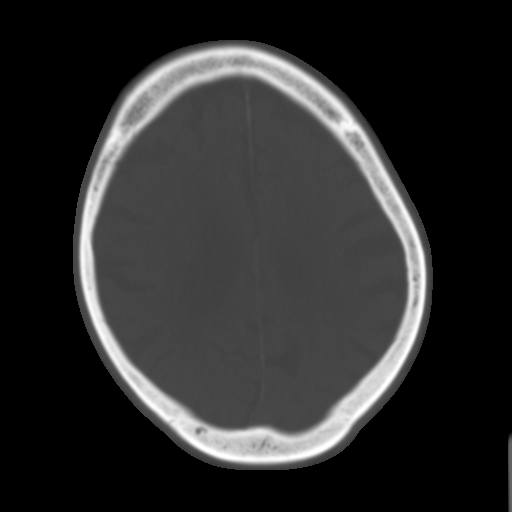
[im 25/33  brain]
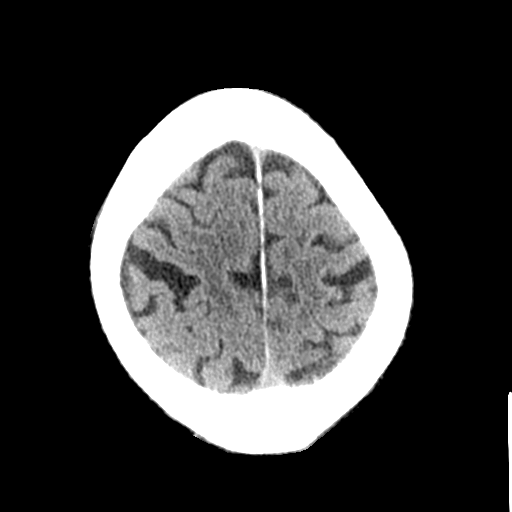
[im 29/33  brain]
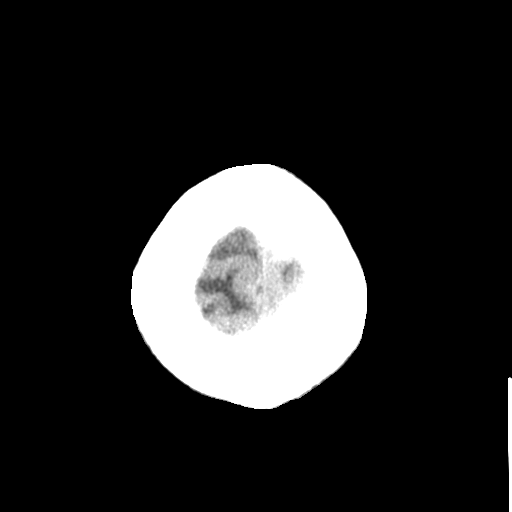

[Series 4: coronal soft · coronal · 0.32mm/px · 3 of 81 slices shown]
[im 27/81  brain]
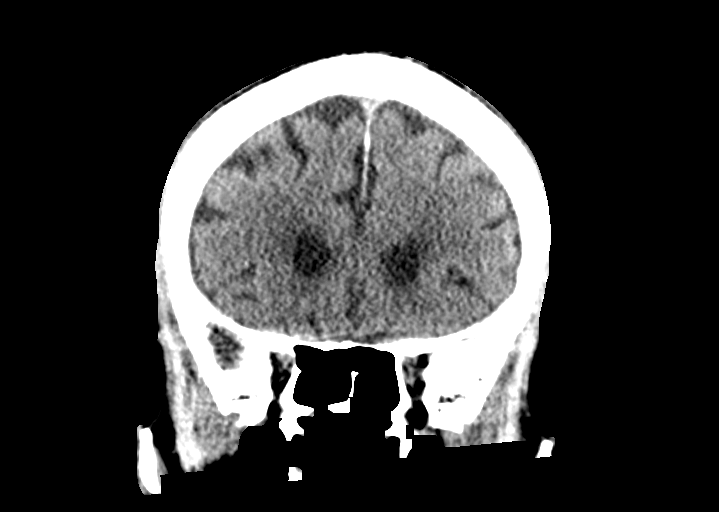
[im 36/81  brain]
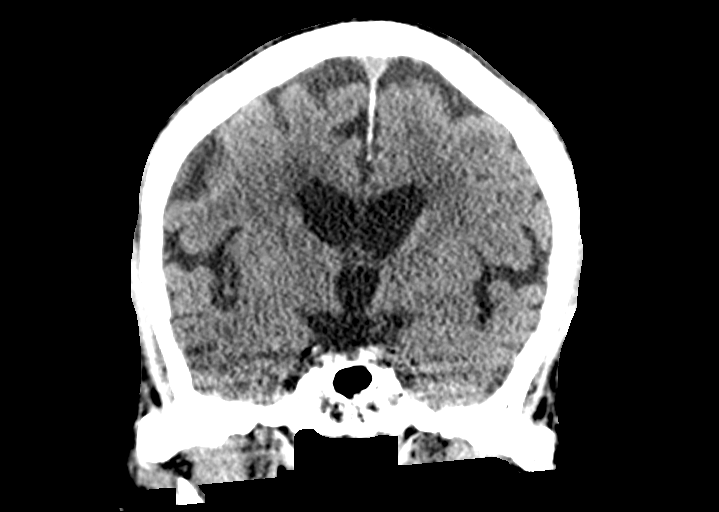
[im 45/81  brain]
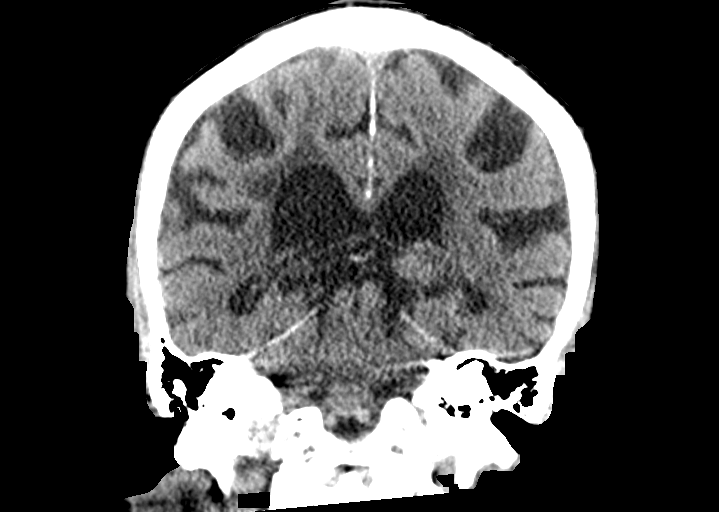

[Series 5: sagittal soft · sagittal · 0.36mm/px · 3 of 56 slices shown]
[im 19/56  brain]
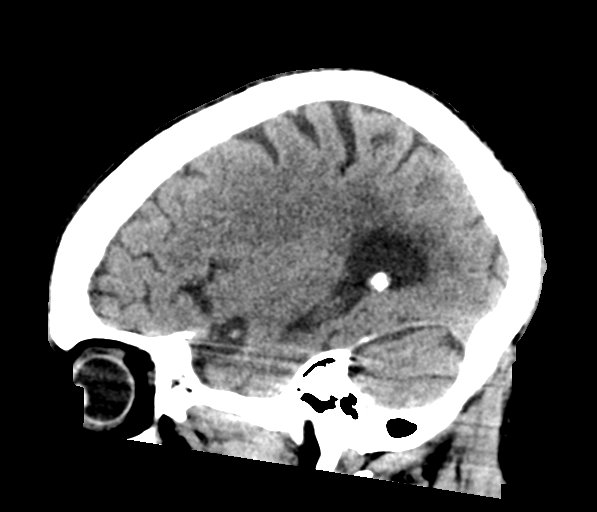
[im 28/56  brain]
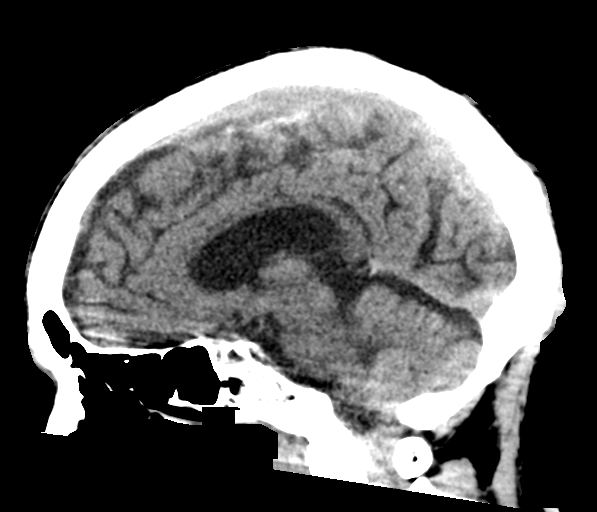
[im 37/56  brain]
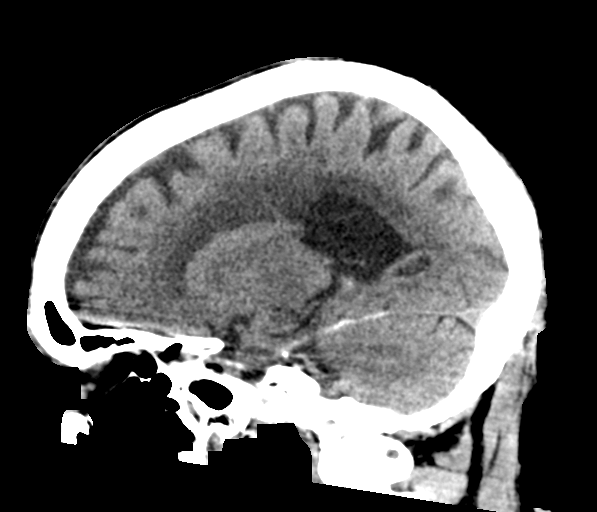

[13 of 47 positions shown; findings below may reference images not displayed]

FINDINGS: CT HEAD FINDINGS

Brain: No evidence of acute infarction, hemorrhage, hydrocephalus,
extra-axial collection or mass lesion/mass effect. Symmetric
prominence of the ventricles, cisterns and sulci compatible with
parenchymal volume loss. Patchy areas of white matter
hypoattenuation are most compatible with chronic microvascular
angiopathy. Benign dural calcifications. Midline intracranial
structures are unremarkable. Cerebellar tonsils are normally
positioned.

Vascular: Atherosclerotic calcification of the carotid siphons and
intradural vertebral arteries. No hyperdense vessel.

Skull: No significant scalp swelling or hematoma. No calvarial
fracture or acute osseous injury. No suspicious osseous lesions.

Sinuses/Orbits: Trace right mastoid effusion. Paranasal sinuses
mastoid air cells are otherwise predominantly clear. Prior right
lens extraction. Included orbital structures are otherwise
unremarkable.

Other: None

CT CERVICAL SPINE FINDINGS

Alignment: Cervical stabilization collar is absent at the time of
examination. There is straightening of the upper cervical lordosis.
Retrolisthesis C4 on C5 is unchanged from comparison two days prior.
Favored to be on a degenerative, spondylitic and facet degenerative
basis. No evidence of traumatic listhesis. No abnormally widened,
perched or jumped facets. Normal alignment of the craniocervical and
atlantoaxial articulations.

Skull base and vertebrae: No acute skull base fracture. No vertebral
body fracture or height loss. Sclerotic changes C3-C4 and C5 as well
as involving the endplates at the T1-2 levels are favored to be on a
Modic type degenerative basis. Some larger osteophytic spurring is
noted at C3-4 including a remote corticated fractured osteophyte
anteriorly. No acute or worrisome osseous lesions.

Soft tissues and spinal canal: No pre or paravertebral fluid or
swelling. No visible canal hematoma.

Disc levels: Moderate arthrosis and spurring about the atlantodental
interval. No canal stenosis. Multilevel disc osteophyte complexes
are present throughout the cervical spine as well as extensive
hypertrophic facet degenerative changes and uncinate spurring. These
findings are extensively detailed level by level on outpatient
cervical spine CT from 2 days prior. In brief, there is multilevel
mild-to-moderate spinal canal and foraminal stenoses throughout the
cervical spine with more severe central canal stenosis secondary to
a large osteophyte and retrolisthesis present at C4-5.

Upper chest: No acute abnormality in the upper chest or imaged lung
apices.

Other: Normal thyroid. Cervical carotid atherosclerosis is noted.
Questionable nodularity involving the left lateral aspect of the
cervical esophagus (4/73) with a similar appearance on comparison
examination. Should consider direct visualization.
IMPRESSION: 1. No acute intracranial abnormality. No significant scalp swelling
or calvarial fracture.
2. Stable parenchymal volume loss and chronic microvascular ischemic
white matter disease.
3. No acute fracture or traumatic listhesis of the cervical spine.
4. Multilevel disc osteophyte complexes and hypertrophic facet
degenerative changes and uncinate spurring resulting in multilevel
canal stenosis and foraminal narrowing. These findings are
extensively detailed level by level on outpatient cervical spine CT
from 2 days prior. Maximal findings at C4-5 with more severe canal
stenosis and moderate bilateral foraminal narrowing.
5. Questionable nodularity involving the left lateral aspect of the
cervical esophagus with a similarly conspicuous appearance on
comparison examination. Could consider direct visualization.
6. Cervical and intracranial atherosclerosis.

## 2022-08-03 ENCOUNTER — Ambulatory Visit: Payer: Medicare HMO | Admitting: Internal Medicine

## 2022-08-05 ENCOUNTER — Ambulatory Visit: Payer: Medicare HMO | Admitting: Internal Medicine

## 2022-08-24 ENCOUNTER — Ambulatory Visit (INDEPENDENT_AMBULATORY_CARE_PROVIDER_SITE_OTHER): Payer: Medicare HMO | Admitting: Family Medicine

## 2022-08-24 VITALS — BP 124/78 | HR 66 | Wt 163.0 lb

## 2022-08-24 DIAGNOSIS — N1832 Chronic kidney disease, stage 3b: Secondary | ICD-10-CM

## 2022-08-24 DIAGNOSIS — R600 Localized edema: Secondary | ICD-10-CM

## 2022-08-24 DIAGNOSIS — I10 Essential (primary) hypertension: Secondary | ICD-10-CM | POA: Diagnosis not present

## 2022-08-24 DIAGNOSIS — I5022 Chronic systolic (congestive) heart failure: Secondary | ICD-10-CM | POA: Diagnosis not present

## 2022-08-24 DIAGNOSIS — Z79899 Other long term (current) drug therapy: Secondary | ICD-10-CM | POA: Diagnosis not present

## 2022-08-24 DIAGNOSIS — E7849 Other hyperlipidemia: Secondary | ICD-10-CM | POA: Diagnosis not present

## 2022-08-24 NOTE — Progress Notes (Addendum)
   Subjective:  This verifies that the history review of any tests, physical exam, assessment and plan were conducted by Dr. Lilyan Punt and documented accordingly by him today Lilyan Punt MD primary care [facility]   Patient ID: [name], male    DOB: 06-01-1925, 89 y.o.   MRN: [id number]  HPI Patient arrives today for 3 month follow up.  Outpatient Encounter Medications as of 08/24/2022  Medication Sig   acetaminophen (TYLENOL) 650 MG CR tablet Take 650-1,300 mg by mouth every 8 (eight) hours as needed for pain.   aspirin 81 MG chewable tablet Chew 1 tablet (81 mg total) by mouth daily.   dapagliflozin propanediol (FARXIGA) 10 MG TABS tablet Take 1 tablet (10 mg total) by mouth daily.   furosemide (LASIX) 20 MG tablet Take 1 tablet (20 mg total) by mouth daily.   losartan (COZAAR) 25 MG tablet Take 1 tablet (25 mg total) by mouth daily.   mupirocin ointment (BACTROBAN) 2 % Apply 1 Application topically 2 (two) times daily.   rosuvastatin (CRESTOR) 20 MG tablet Take 1 tablet (20 mg total) by mouth daily.   spironolactone (ALDACTONE) 25 MG tablet Take 0.5 tablets (12.5 mg total) by mouth daily.   No facility-administered encounter medications on file as of 08/24/2022.    Patient states he has a bump on rectum that hurts when he sits.  Patient has had this problem for he states it is actually in the buttock region Is not draining any fluid   He does have a history of hyperlipidemia takes his medicine tries to eat relatively healthy  Patient also has heart failure followed by cardiology on a regular basis does have some pedal edema but it is actually not getting worse.  States his breathing overall is doing fairly well when he moves around denies any significant shortness of breath States his energy level not as good as it used to be compared to several years ago  Appetite is doing well moods are doing well   Review of Systems     Objective:   Physical  Exam General-in no acute distress Eyes-no discharge Lungs-respiratory rate normal, CTA CV-no murmurs,RRR Extremities skin warm dry minimal pedal edema Neuro grossly normal Behavior normal, alert        Assessment & Plan:  1. Pedal edema Continue current diuretics.  Continue healthy eating.  2. Essential hypertension Blood pressure decent control healthy diet recommended  3. Stage 3b chronic kidney disease (HCC) Patient is due to get his lab work done he is encouraged to go ahead today await the results  4. Chronic HFrEF (heart failure with reduced ejection fraction) (HCC) Stable currently follow-up with cardiology regular basis

## 2022-08-25 ENCOUNTER — Encounter: Payer: Self-pay | Admitting: Family Medicine

## 2022-08-25 LAB — BASIC METABOLIC PANEL
BUN/Creatinine Ratio: 9 — ABNORMAL LOW (ref 10–24)
BUN: 12 mg/dL (ref 10–36)
CO2: 25 mmol/L (ref 20–29)
Calcium: 9.3 mg/dL (ref 8.6–10.2)
Chloride: 104 mmol/L (ref 96–106)
Creatinine, Ser: 1.32 mg/dL — ABNORMAL HIGH (ref 0.76–1.27)
Glucose: 89 mg/dL (ref 70–99)
Potassium: 4.2 mmol/L (ref 3.5–5.2)
Sodium: 143 mmol/L (ref 134–144)
eGFR: 50 mL/min/{1.73_m2} — ABNORMAL LOW (ref >59)

## 2022-08-25 LAB — MAGNESIUM: Magnesium: 1.9 mg/dL (ref 1.6–2.3)

## 2022-08-25 LAB — PHOSPHORUS: Phosphorus: 3 mg/dL (ref 2.8–4.1)

## 2022-08-25 LAB — LIPID PANEL
Chol/HDL Ratio: 1.6 ratio (ref 0.0–5.0)
Cholesterol, Total: 128 mg/dL (ref 100–199)
HDL: 79 mg/dL (ref >39)
LDL Chol Calc (NIH): 37 mg/dL (ref 0–99)
Triglycerides: 54 mg/dL (ref 0–149)
VLDL Cholesterol Cal: 12 mg/dL (ref 5–40)

## 2022-08-25 NOTE — Progress Notes (Signed)
Please mail to the patient 

## 2022-08-27 ENCOUNTER — Other Ambulatory Visit: Payer: Self-pay | Admitting: Family Medicine

## 2022-09-19 ENCOUNTER — Other Ambulatory Visit: Payer: Self-pay | Admitting: Family Medicine

## 2022-10-05 ENCOUNTER — Ambulatory Visit: Payer: Medicare HMO | Attending: Internal Medicine | Admitting: Internal Medicine

## 2022-10-05 ENCOUNTER — Encounter: Payer: Self-pay | Admitting: Internal Medicine

## 2022-10-05 VITALS — BP 126/70 | HR 66 | Ht 71.0 in | Wt 153.6 lb

## 2022-10-05 DIAGNOSIS — I5020 Unspecified systolic (congestive) heart failure: Secondary | ICD-10-CM | POA: Diagnosis not present

## 2022-10-05 NOTE — Progress Notes (Signed)
Cardiology Office Note   Date:  10/05/2022   ID:  [name], [name] 1925/04/25, MRN [id number]  PCP:  Babs Sciara, MD  Cardiologist:   Dietrich Pates, MD     History of Present Illness: [name] is a 89 y.o. male with a history of HTN, AAA, atypical CP, HFrEF   I saw him in clinic in 2018   H Pt admitted in Dec 2023 with SOB Echo in Dec 2023 showed LVEF 30 to 35%   Rx with IV lasix   Trop flat at 83, 85.     EKG with T wave inversion and intermitt LBBB  PCT showed coornary calcifications.   Treated medically    The pt was last seen by Leda Gauze in Feb 2024  Since seen the pt says he is doing OK   No CP  Breathing is OK   No dizziness   Current Meds  Medication Sig   acetaminophen (TYLENOL) 650 MG CR tablet Take 650-1,300 mg by mouth every 8 (eight) hours as needed for pain.   aspirin 81 MG chewable tablet Chew 1 tablet (81 mg total) by mouth daily.   dapagliflozin propanediol (FARXIGA) 10 MG TABS tablet Take 1 tablet (10 mg total) by mouth daily.   furosemide (LASIX) 20 MG tablet Take 1 tablet (20 mg total) by mouth daily.   losartan (COZAAR) 25 MG tablet Take 1 tablet (25 mg total) by mouth daily.   mupirocin ointment (BACTROBAN) 2 % Apply 1 Application topically 2 (two) times daily.   rosuvastatin (CRESTOR) 20 MG tablet Take 1 tablet (20 mg total) by mouth daily.   spironolactone (ALDACTONE) 25 MG tablet Take 0.5 tablets (12.5 mg total) by mouth daily.     Allergies:   Penicillin g benzathine and Penicillins   Past Medical History:  Diagnosis Date   Asthma    Diverticulosis    GERD (gastroesophageal reflux disease)    Gout    Heart murmur    Evaluation by a cardiologist years ago was reportedly negative   HTN (hypertension)    Osteoarthritis    Reflux     Past Surgical History:  Procedure Laterality Date   APPENDECTOMY  2007   COLONOSCOPY  2003   COLONOSCOPY N/A 12/28/2012   Procedure: COLONOSCOPY;  Surgeon: Malissa Hippo, MD;  Location: [facility];  Service: Endoscopy;  Laterality: N/A;  255-rescheduled to 10:30 Ann notified pt   EYE SURGERY Right    cataract removal   INGUINAL HERNIA REPAIR     Right   JOINT REPLACEMENT     right total knee RIGHT total knee arthroplasty   TOTAL KNEE ARTHROPLASTY  09/2009   Right     Social History:  The patient  reports that he quit smoking about 54 years ago. His smoking use included pipe. He started smoking about 82 years ago. He has never used smokeless tobacco. He reports that he does not drink alcohol and does not use drugs.   Family History:  The patient's family history includes Arthritis in an other family member; Cancer in his brother; Heart attack in his father; Heart disease in an other family member; Hypertension in his father and mother; Uterine cancer in his maternal aunt.    ROS:  Please see the history of present illness. All other systems are reviewed and  Negative to the above problem except as noted.    PHYSICAL EXAM: VS:  BP 126/70 (BP Location: Left Arm, Patient  Position: Sitting, Cuff Size: Normal)   Pulse 66   Ht 5\' 11"  (1.803 m)   Wt 153 lb 9.6 oz (69.7 kg)   SpO2 96%   BMI 21.42 kg/m   GEN: Pt in no acute distress  HEENT: normal  Neck: no JVD, Cardiac: RRR; no murmur   No LE  edema  Respiratory:  clear to auscultation bilaterally GI: soft, nontender, nondistended,  No hepatomegaly  EKG   Not done today    Lipid Panel    Component Value Date/Time   CHOL 128 08/24/2022 1206   TRIG 54 08/24/2022 1206   HDL 79 08/24/2022 1206   CHOLHDL 1.6 08/24/2022 1206   CHOLHDL 2.9 01/11/2014 0928   VLDL 23 01/11/2014 0928   LDLCALC 37 08/24/2022 1206      Wt Readings from Last 3 Encounters:  10/05/22 153 lb 9.6 oz (69.7 kg)  08/24/22 163 lb (73.9 kg)  05/22/22 166 lb (75.3 kg)      ASSESSMENT AND PLAN:  1  HFrEF   Pt with LVEF 30 to 35%   Global hypokinesis.    Plan to continue meical Rx   On  Farxiga, lasix, losartan and aldactone    BP  controlloed  2  Hx CP   Pt with coronary calcifications Myoview in 2018 normal perfusion  Pt denies symptoms of anigna  3  HL  LDL 37  HDL 79   June 2024    Follow up in winter   Stay active    Current medicines are reviewed at length with the patient today.  The patient does not have concerns regarding medicines.  Signed, Dietrich Pates, MD  10/05/2022 9:25 PM    [facility] [address] [place], [place], [place]  [address] Phone: [phone]; Fax: [phone]

## 2022-10-05 NOTE — Patient Instructions (Signed)
Medication Instructions:  Your physician recommends that you continue on your current medications as directed. Please refer to the Current Medication list given to you today.  *If you need a refill on your cardiac medications before your next appointment, please call your pharmacy*   Lab Work: NONE   If you have labs (blood work) drawn today and your tests are completely normal, you will receive your results only by: MyChart Message (if you have MyChart) OR A paper copy in the mail If you have any lab test that is abnormal or we need to change your treatment, we will call you to review the results.   Testing/Procedures: NONE    Follow-Up: At [facility], you and your health needs are our priority.  As part of our continuing mission to provide you with exceptional heart care, we have created designated Provider Care Teams.  These Care Teams include your primary Cardiologist (physician) and Advanced Practice Providers (APPs -  Physician Assistants and Nurse Practitioners) who all work together to provide you with the care you need, when you need it.  We recommend signing up for the patient portal called "MyChart".  Sign up information is provided on this After Visit Summary.  MyChart is used to connect with patients for Virtual Visits (Telemedicine).  Patients are able to view lab/test results, encounter notes, upcoming appointments, etc.  Non-urgent messages can be sent to your provider as well.   To learn more about what you can do with MyChart, go to [email or web address].    Your next appointment:    November   Provider:   You may see Dietrich Pates, MD or one of the following Advanced Practice Providers on your designated Care Team:   Randall An, PA-C  Jacolyn Reedy, PA-C     Other Instructions Thank you for choosing [facility]!

## 2022-10-07 ENCOUNTER — Telehealth: Payer: Self-pay

## 2022-10-07 NOTE — Telephone Encounter (Signed)
More than likely this is a strained muscle.  Recommend gentle stretching/range of motion May continue Tylenol and warm compresses and not hot compresses They have an appointment with Eber Jones or myself-more than likely both of [place] are full appointment may end up needing to be next week  (If severely worse may need to go to urgent care)

## 2022-10-07 NOTE — Telephone Encounter (Signed)
Patient is calling due to left side neck pain that started about 2 days ago , it pains when he turns his head. He is not having pain in any other areas chest, shoulder, headache or arm pain. No swelling or redness reported. He is taking tylenol and using a heating pad. Please advise

## 2022-10-08 ENCOUNTER — Ambulatory Visit
Admission: EM | Admit: 2022-10-08 | Discharge: 2022-10-08 | Disposition: A | Discharge status: Home / Self Care | Payer: Medicare HMO | Attending: Internal Medicine | Admitting: Internal Medicine

## 2022-10-08 DIAGNOSIS — M436 Torticollis: Secondary | ICD-10-CM

## 2022-10-08 MED ORDER — ACETAMINOPHEN 325 MG PO TABS
650.0000 mg | ORAL_TABLET | Freq: Once | ORAL | Status: AC
  Administered 2022-10-08: 650 mg via ORAL

## 2022-10-08 MED ORDER — BACLOFEN 10 MG PO TABS: 5.0000 mg | ORAL_TABLET | Freq: Three times a day (TID) | ORAL | 0 refills | Status: DC

## 2022-10-08 MED ORDER — PREDNISONE 20 MG PO TABS
20.0000 mg | ORAL_TABLET | Freq: Once | ORAL | Status: AC
  Administered 2022-10-08: 20 mg via ORAL

## 2022-10-08 MED ORDER — PREDNISONE 20 MG PO TABS: 20.0000 mg | ORAL_TABLET | Freq: Every day | ORAL | 0 refills | Status: DC

## 2022-10-08 NOTE — ED Provider Notes (Signed)
[facility]    CSN: [id number] Arrival date & time: 10/08/22  1000      History   Chief Complaint No chief complaint on file.   HPI [name] is a 89 y.o. male who presents with his daughter due to having  L neck pain x 3 days. HA onset yesterday and took Tylenol. He is having trouble turning his head to either side due to pain. He does admits before this happened, he was lifting something above his head. He was feeling a little neck pain then, but felt worse after the lifting. Denies fever, chills, URI symptoms. Has L cervical L radiculopathy hx x 3 years, which does not feel any worse since the pain started.     Past Medical History:  Diagnosis Date   Asthma    Diverticulosis    GERD (gastroesophageal reflux disease)    Gout    Heart murmur    Evaluation by a cardiologist years ago was reportedly negative   HTN (hypertension)    Osteoarthritis    Reflux     Patient Active Problem List   Diagnosis Date Noted   Thrombocytopenia (HCC) 03/13/2022   LBBB (left bundle branch block) 03/12/2022   Stage 3b chronic kidney disease (HCC) 08/19/2021   Pedal edema 02/12/2020   DOE (dyspnea on exertion) 02/12/2020   Peripheral edema 01/29/2020   S/P total knee replacement, left 06/09/10 01/03/2018   Chest pain 09/24/2016   AAA (abdominal aortic aneurysm) (HCC) 09/24/2016   Cognitive dysfunction 12/27/2014   Hyperlipidemia 12/27/2014   Gout 01/11/2014   HTN (hypertension) 01/11/2014   Anemia, iron deficiency 04/03/2013   S/P total knee replacement, right 09/21/09 09/16/2010   GASTROESOPHAGEAL REFLUX DISEASE 05/08/2010   DIVERTICULOSIS, COLON 05/08/2010   TOBACCO ABUSE 05/08/2010   KNEE, ARTHRITIS, DEGEN./OSTEO 04/05/2008    Past Surgical History:  Procedure Laterality Date   APPENDECTOMY  2007   COLONOSCOPY  2003   COLONOSCOPY N/A 12/28/2012   Procedure: COLONOSCOPY;  Surgeon: Malissa Hippo, MD;  Location: [facility];  Service: Endoscopy;   Laterality: N/A;  255-rescheduled to 10:30 Ann notified pt   EYE SURGERY Right    cataract removal   INGUINAL HERNIA REPAIR     Right   JOINT REPLACEMENT     right total knee RIGHT total knee arthroplasty   TOTAL KNEE ARTHROPLASTY  09/2009   Right       Home Medications    Prior to Admission medications   Medication Sig Start Date End Date Taking? Authorizing Provider  baclofen (LIORESAL) 10 MG tablet Take 0.5 tablets (5 mg total) by mouth 3 (three) times daily. 10/08/22  Yes Rodriguez-Southworth, Nettie Elm, PA-C  predniSONE (DELTASONE) 20 MG tablet Take 1 tablet (20 mg total) by mouth daily with breakfast. For inflammation 10/08/22  Yes Rodriguez-Southworth, Nettie Elm, PA-C  acetaminophen (TYLENOL) 650 MG CR tablet Take 650-1,300 mg by mouth every 8 (eight) hours as needed for pain.    [provider]  aspirin 81 MG chewable tablet Chew 1 tablet (81 mg total) by mouth daily. 03/18/22   Catarina Hartshorn, MD  dapagliflozin propanediol (FARXIGA) 10 MG TABS tablet Take 1 tablet (10 mg total) by mouth daily. 05/22/22   Babs Sciara, MD  furosemide (LASIX) 20 MG tablet Take 1 tablet (20 mg total) by mouth daily. 05/22/22   Babs Sciara, MD  losartan (COZAAR) 25 MG tablet Take 1 tablet (25 mg total) by mouth daily. 05/22/22   Babs Sciara,  MD  mupirocin ointment (BACTROBAN) 2 % Apply 1 Application topically 2 (two) times daily. 09/22/22   Babs Sciara, MD  rosuvastatin (CRESTOR) 20 MG tablet Take 1 tablet (20 mg total) by mouth daily. 05/22/22   Babs Sciara, MD  spironolactone (ALDACTONE) 25 MG tablet Take 0.5 tablets (12.5 mg total) by mouth daily. 08/27/22   Babs Sciara, MD    Family History Family History  Problem Relation Age of Onset   Hypertension Mother    Hypertension Father    Heart attack Father    Cancer Brother        colon   Arthritis Other    Heart disease Other        Male < 2   Uterine cancer Maternal Aunt     Social History Social History   Tobacco Use    Smoking status: Former    Types: Pipe    Start date: 12/04/1939    Quit date: 1970    Years since quitting: 54.6   Smokeless tobacco: Never   Tobacco comments:    quit smoking cigarettes in 1971  Vaping Use   Vaping status: Never Used  Substance Use Topics   Alcohol use: No   Drug use: No     Allergies   Penicillin g benzathine and Penicillins   Review of Systems Review of Systems  Constitutional:  Negative for fatigue.  HENT:  Negative for dental problem, postnasal drip and rhinorrhea.   Respiratory:  Negative for cough.   Musculoskeletal:  Positive for neck pain and neck stiffness.  Neurological:  Positive for numbness. Negative for weakness.     Physical Exam Triage Vital Signs ED Triage Vitals  Encounter Vitals Group     BP 10/08/22 1007 135/75     Systolic BP Percentile --      Diastolic BP Percentile --      Pulse Rate 10/08/22 1007 76     Resp 10/08/22 1007 20     Temp 10/08/22 1007 98 F (36.7 C)     Temp Source 10/08/22 1007 Oral     SpO2 10/08/22 1007 96 %     Weight --      Height --      Head Circumference --      Peak Flow --      Pain Score 10/08/22 1009 7     Pain Loc --      Pain Education --      Exclude from Growth Chart --    No data found.  Updated Vital Signs BP 135/75 (BP Location: Right Arm)   Pulse 76   Temp 98 F (36.7 C) (Oral)   Resp 20   SpO2 96%   Visual Acuity Right Eye Distance:   Left Eye Distance:   Bilateral Distance:    Right Eye Near:   Left Eye Near:    Bilateral Near:     Physical Exam Vitals and nursing note reviewed.  Constitutional:      General: He is not in acute distress.    Appearance: He is normal weight. He is not toxic-appearing.  HENT:     Right Ear: External ear normal.     Left Ear: External ear normal.     Mouth/Throat:     Mouth: Mucous membranes are moist.  Eyes:     General: No scleral icterus.    Conjunctiva/sclera: Conjunctivae normal.  Pulmonary:     Effort: Pulmonary effort  is normal.  Musculoskeletal:  General: Normal range of motion.     Comments: NECK- has tense and tender L trapezius  and mild of L sternocleidomastoid muscle. Has decreased rotation to the R, and unable to rotate at all to the left or look up due to pain and stiffness. Anterior neck flexion is normal.   Lymphadenopathy:     Cervical: No cervical adenopathy.  Skin:    General: Skin is warm and dry.     Findings: No rash.  Neurological:     Mental Status: He is alert and oriented to person, place, and time.     Motor: No weakness.     Gait: Gait normal.     Deep Tendon Reflexes: Reflexes normal.  Psychiatric:        Mood and Affect: Mood normal.        Behavior: Behavior normal.        Thought Content: Thought content normal.        Judgment: Judgment normal.      UC Treatments / Results  Labs (all labs ordered are listed, but only abnormal results are displayed) Labs Reviewed - No data to display  EKG   Radiology  EXAM: CT CERVICAL SPINE WITHOUT CONTRAST   TECHNIQUE: Multidetector CT imaging of the cervical spine was performed without intravenous contrast. Multiplanar CT image reconstructions were also generated.   COMPARISON:  None.   FINDINGS: Alignment: Minimal retrolisthesis of C4 on C5 is seen.   Skull base and vertebrae: Visualized skull base is intact. No atlanto-occipital dissociation. The vertebral body heights are well maintained. No fracture or pathologic osseous lesion seen.   Soft tissues and spinal canal: The visualized paraspinal soft tissues are unremarkable. No prevertebral soft tissue swelling is seen. The spinal canal is grossly unremarkable, no large epidural collection or significant canal narrowing.   Disc levels:  Multilevel cervical spine spondylosis is seen.   C1-C2: Atlanto-axial junction is normal, without canal narrowing   C2-C3: There is disc osteophyte complex and uncovertebral osteophytes which causes moderate right and  mild left neural foraminal narrowing and mild central canal stenosis.   C3-C4: There is disc osteophyte complex and uncovertebral osteophytes which causes moderate to severe right and moderate left neural foraminal narrowing. There is mild central canal stenosis.   C4-C5: Disc osteophyte complex and uncovertebral osteophytes are noted with partial calcification of the posterior longitudinal ligament. There is severe left and moderate to severe right neural foraminal narrowing as well as moderate to severe central canal stenosis.   C5-C6: Disc osteophyte complex and uncovertebral osteophytes are present which causes moderate bilateral neural foraminal narrowing.   C6-C7: Disc osteophyte complex and uncovertebral osteophytes are present which causes moderate to severe bilateral neural foraminal narrowing and mild central canal stenosis.   C7-T1: No significant spinal canal or neural foraminal narrowing   Upper chest: The lung apices are clear. Thoracic inlet is within normal limits.   Other: Carotid artery calcifications are seen.   IMPRESSION: Minimal retrolisthesis of C4 on C5.   Cervical spine spondylosis most notable at C4-C5 with severe left neural foraminal narrowing and moderate to severe central canal stenosis.     Electronically Signed   By: Jonna Clark M.D.   On: 11/13/2019 20:46 Procedures Procedures (including critical care time)  Medications Ordered in UC Medications  predniSONE (DELTASONE) tablet 20 mg (20 mg Oral Given 10/08/22 1038)  acetaminophen (TYLENOL) tablet 650 mg (650 mg Oral Given 10/08/22 1038)    Initial Impression / Assessment and Plan /  UC Course  I have reviewed the triage vital signs and the nursing notes.  [place]  He was given a dose of Prednisone 20 mg PO and Tylenol 650 mg PO here since he has to wait for his Rx to be derived Due to low eGFR and elevated creatinine, I placed him on Prednisone 20 mg every day for 4 more days, and  low dose of Baclofen for muscle spasm. I reviewed the side effects of the Baclofen with both him and his daughter.  Needs to FU with PCP next week. See instructions.    Final Clinical Impressions(s) / UC Diagnoses   Final diagnoses:  Torticollis, acute     Discharge Instructions      The muscle relaxer called Baclofen may cause sedation, and if this happens, only take it at bed time.   Apply heat only for 20 minutes at a time 3-4 times a day.   Follow up with your primary care doctor next week, in case you may need physical therapy to get better     ED Prescriptions     Medication Sig Dispense Auth. Provider   baclofen (LIORESAL) 10 MG tablet Take 0.5 tablets (5 mg total) by mouth 3 (three) times daily. 12 each Rodriguez-Southworth, Nettie Elm, PA-C   predniSONE (DELTASONE) 20 MG tablet Take 1 tablet (20 mg total) by mouth daily with breakfast. For inflammation 5 tablet Rodriguez-Southworth, Nettie Elm, PA-C      PDMP not reviewed this encounter.   Garey Ham, PA-C 10/08/22 1058

## 2022-10-08 NOTE — Discharge Instructions (Addendum)
The muscle relaxer called Baclofen may cause sedation, and if this happens, only take it at bed time.   Apply heat only for 20 minutes at a time 3-4 times a day.   Follow up with your primary care doctor next week, in case you may need physical therapy to get better

## 2022-10-08 NOTE — ED Triage Notes (Signed)
Pt reports he has been having left side neck pain x 3 days. Headache x 1 day and took tylenol   States he cannot turn his neck to either side.   States he was lifting something over his head and may have lifted wrong.

## 2022-10-08 NOTE — Telephone Encounter (Signed)
Patient scheduled appt next Wednesday 10/14/22 with Eber Jones NP

## 2022-10-14 ENCOUNTER — Encounter: Payer: Self-pay | Admitting: Nurse Practitioner

## 2022-10-14 ENCOUNTER — Ambulatory Visit (INDEPENDENT_AMBULATORY_CARE_PROVIDER_SITE_OTHER): Payer: Medicare HMO | Admitting: Nurse Practitioner

## 2022-10-14 VITALS — BP 124/67 | HR 73 | Wt 161.2 lb

## 2022-10-14 DIAGNOSIS — R300 Dysuria: Secondary | ICD-10-CM

## 2022-10-14 DIAGNOSIS — I739 Peripheral vascular disease, unspecified: Secondary | ICD-10-CM | POA: Diagnosis not present

## 2022-10-14 DIAGNOSIS — B351 Tinea unguium: Secondary | ICD-10-CM | POA: Diagnosis not present

## 2022-10-14 DIAGNOSIS — M545 Low back pain, unspecified: Secondary | ICD-10-CM | POA: Diagnosis not present

## 2022-10-14 DIAGNOSIS — M79674 Pain in right toe(s): Secondary | ICD-10-CM | POA: Diagnosis not present

## 2022-10-14 DIAGNOSIS — M79675 Pain in left toe(s): Secondary | ICD-10-CM | POA: Diagnosis not present

## 2022-10-14 DIAGNOSIS — M62838 Other muscle spasm: Secondary | ICD-10-CM | POA: Diagnosis not present

## 2022-10-14 LAB — POCT URINALYSIS DIP (CLINITEK)
Glucose, UA: 500 mg/dL — AB
Ketones, POC UA: NEGATIVE mg/dL
Leukocytes, UA: NEGATIVE
Nitrite, UA: NEGATIVE
POC PROTEIN,UA: NEGATIVE
Spec Grav, UA: 1.015 (ref 1.010–1.025)
Urobilinogen, UA: 0.2 E.U./dL
pH, UA: 6 (ref 5.0–8.0)

## 2022-10-14 LAB — GLUCOSE, POCT (MANUAL RESULT ENTRY): POC Glucose: 101 mg/dl — AB (ref 70–99)

## 2022-10-14 MED ORDER — BACLOFEN 10 MG PO TABS: 5.0000 mg | ORAL_TABLET | Freq: Three times a day (TID) | ORAL | 0 refills | Status: DC

## 2022-10-14 NOTE — Progress Notes (Signed)
poc  Subjective:    Patient ID: [name], male    DOB: 06/18/1925, 89 y.o.   MRN: [id number]  HPI Patient arrives today for urgent care follow up. Patient states his neck is better.  Was seen at urgent care on 10/08/2022 for acute torticollis.  Completed his course of prednisone.  States his neck movement is much better.  Still has some tight muscles.  Requesting a refill on his baclofen.  Denies any specific history of injury.  Also having some discomfort in the right lumbar area about the right hip with some pain radiating down his right leg at times.  No change in gait.  Noticed some mild dysuria this morning with urination.  Otherwise no change in bowel or bladder habits.  No change in urinary stream or difficulty urinating.       Objective:   Physical Exam NAD.  Alert, oriented.  Lungs clear.  Heart regular rate rhythm.  Very tight muscles noted along the sternocleidomastoid and trapezius bilaterally.  In addition tight muscles noted along the left upper rhomboids.  Patient is able to turn his head without difficulty.  Hand and arm strength 5+ bilaterally.  Mild tenderness with palpation of the right lumbar area.  SLR negative bilaterally.  Passive ROM of the right hip without tenderness.  He is able to walk using a cane. Today's Vitals   10/14/22 1038  BP: 124/67  Pulse: 73  SpO2: 99%  Weight: 161 lb 3.2 oz (73.1 kg)   Body mass index is 22.48 kg/m.  Results for orders placed or performed in visit on 10/14/22  POCT URINALYSIS DIP (CLINITEK)  Result Value Ref Range   Color, UA light yellow (A) yellow   Clarity, UA clear clear   Glucose, UA =500 (A) negative mg/dL   Bilirubin, UA moderate (A) negative   Ketones, POC UA negative negative mg/dL   Spec Grav, UA 4.259 5.638 - 1.025   Blood, UA small (A) negative   pH, UA 6.0 5.0 - 8.0   POC PROTEIN,UA negative negative, trace   Urobilinogen, UA 0.2 0.2 or 1.0 E.U./dL   Nitrite, UA Negative Negative   Leukocytes, UA  Negative Negative  Glucose (CBG)  Result Value Ref Range   POC Glucose 101 (A) 70 - 99 mg/dl          Assessment & Plan:  Muscle spasms of neck  Acute right-sided low back pain without sciatica  Dysuria - Plan: POCT URINALYSIS DIP (CLINITEK), Glucose (CBG), Urine Culture   Meds ordered this encounter  Medications   baclofen (LIORESAL) 10 MG tablet    Sig: Take 0.5 tablets (5 mg total) by mouth 3 (three) times daily. Prn muscle spasms    Dispense:  30 each    Refill:  0    Please deliver to  his home    Order Specific Question:   Supervising Provider    Answer:   Lilyan Punt A [[id number]]  Glycosuria noted but BS normal in the office.  Continue Baclofen as directed prn.  Ice/heat applications TENS unit on muscles; prescription given  Stretching exercises Massage therapy Topical voltaren gel (generic is fine) Urine culture pending. Call back if worsens or persists.

## 2022-10-14 NOTE — Patient Instructions (Addendum)
Ice/heat applications TENS unit on muscles Stretching exercises Massage therapy Topical voltaren gel (generic is fine)

## 2022-11-10 ENCOUNTER — Other Ambulatory Visit: Payer: Self-pay | Admitting: Family Medicine

## 2022-12-10 ENCOUNTER — Ambulatory Visit (INDEPENDENT_AMBULATORY_CARE_PROVIDER_SITE_OTHER): Payer: Medicare HMO | Admitting: Family Medicine

## 2022-12-10 ENCOUNTER — Other Ambulatory Visit: Payer: Self-pay | Admitting: Family Medicine

## 2022-12-10 VITALS — BP 132/80 | HR 81 | Temp 98.4°F | Ht 71.0 in | Wt 159.8 lb

## 2022-12-10 DIAGNOSIS — R634 Abnormal weight loss: Secondary | ICD-10-CM

## 2022-12-10 DIAGNOSIS — I5022 Chronic systolic (congestive) heart failure: Secondary | ICD-10-CM

## 2022-12-10 DIAGNOSIS — N1832 Chronic kidney disease, stage 3b: Secondary | ICD-10-CM

## 2022-12-10 DIAGNOSIS — R0609 Other forms of dyspnea: Secondary | ICD-10-CM

## 2022-12-10 DIAGNOSIS — Z23 Encounter for immunization: Secondary | ICD-10-CM | POA: Diagnosis not present

## 2022-12-10 MED ORDER — DAPAGLIFLOZIN PROPANEDIOL 5 MG PO TABS: 5.0000 mg | ORAL_TABLET | Freq: Every day | ORAL | 4 refills | Status: DC

## 2022-12-10 NOTE — Progress Notes (Signed)
Subjective:    Patient ID: [name], male    DOB: December 23, 1925, 89 y.o.   MRN: [id number]  Discussed the use of AI scribe software for clinical note transcription with the patient, who gave verbal consent to proceed.  History of Present Illness   The patient, with a history of heart disease and lung issues, presents with intermittent shortness of breath, particularly noticeable during physical activity such as walking. The patient reports that the shortness of breath is not constant and does not worsen with uphill movement or stair climbing. He also reports an episode of hemoptysis two months ago, characterized by a small amount of blood mixed with mucus. The patient denies any nocturnal cough or dyspnea when lying down.  The patient also experiences episodes of diaphoresis, described as feeling 'clammy,' which occur at any time and are relieved by air movement. He reports a good appetite and regular urination, but has been experiencing constipation. The patient also mentions frequent belching, but denies any associated pain or regurgitation.  The patient has noticed some weight loss, but the exact amount is unclear. He also reports some puffiness in the legs, but denies severe swelling. The patient is compliant with his current medication regimen, which includes [place]. He reports no chest pain or nausea.      Patient denies night sweats high fever chills denies wheezing or difficulty breathing except for some shortness of breath with activity  Review of Systems     Objective:    Physical Exam   VITALS: BP- 128/68 sitting, BP- 110/70 standing MEASUREMENTS: WT- 158 NECK: No lymphadenopathy palpated. CHEST: Lungs clear to auscultation. CARDIOVASCULAR: Heart rhythm regular, no murmurs detected. ABDOMEN: Soft. EXTREMITIES: Mild puffiness in legs.     He does have a history of heart failure echo was reviewed.      Assessment & Plan:  Assessment and Plan    Shortness of  Breath Intermittent, exertional, and not associated with chest pain or orthopnea. No recent fevers or night sweats. Noted a drop in blood pressure upon standing which may contribute to symptoms. -Reduce Farxiga from 10mg  to 5mg  daily to potentially improve energy levels and mitigate orthostatic symptoms. -Order chest x-ray to evaluate lung status. -Order blood work to assess electrolytes, kidney function, and hemoglobin.  Weight Loss Lost 5 pounds over the past 3-4 months. Appetite is reportedly good. -Encourage consistent meals (breakfast, lunch, dinner) to prevent further weight loss.  Regurgitation Frequent belching, no associated pain or vomiting. -No specific intervention discussed.  General Health Maintenance -Administer influenza vaccine today. -Follow-up in 4 months.     Change Farxiga to 5 mg because he does have a drop in blood pressure when he stands  I am concerned to degree about his weight loss I believe he may not be eating quite as much as he used to.  We need to monitor this closely so therefore follow-up in a few months if he is still losing weight then we will need to move forward with doing additional testing We will go ahead and set up for a chest x-ray as well as lab work  We will set up lab work

## 2022-12-11 LAB — BASIC METABOLIC PANEL
BUN/Creatinine Ratio: 9 — ABNORMAL LOW (ref 10–24)
BUN: 13 mg/dL (ref 10–36)
CO2: 23 mmol/L (ref 20–29)
Calcium: 10.2 mg/dL (ref 8.6–10.2)
Chloride: 103 mmol/L (ref 96–106)
Creatinine, Ser: 1.44 mg/dL — ABNORMAL HIGH (ref 0.76–1.27)
Glucose: 93 mg/dL (ref 70–99)
Potassium: 4.1 mmol/L (ref 3.5–5.2)
Sodium: 143 mmol/L (ref 134–144)
eGFR: 45 mL/min/{1.73_m2} — ABNORMAL LOW (ref >59)

## 2022-12-11 LAB — CBC WITH DIFFERENTIAL/PLATELET
Basophils Absolute: 0.1 10*3/uL (ref 0.0–0.2)
Basos: 1 %
EOS (ABSOLUTE): 0.1 10*3/uL (ref 0.0–0.4)
Eos: 2 %
Hematocrit: 38.2 % (ref 37.5–51.0)
Hemoglobin: 12.4 g/dL — ABNORMAL LOW (ref 13.0–17.7)
Immature Grans (Abs): 0 10*3/uL (ref 0.0–0.1)
Immature Granulocytes: 0 %
Lymphocytes Absolute: 0.8 10*3/uL (ref 0.7–3.1)
Lymphs: 17 %
MCH: 32 pg (ref 26.6–33.0)
MCHC: 32.5 g/dL (ref 31.5–35.7)
MCV: 99 fL — ABNORMAL HIGH (ref 79–97)
Monocytes Absolute: 0.6 10*3/uL (ref 0.1–0.9)
Monocytes: 11 %
Neutrophils Absolute: 3.4 10*3/uL (ref 1.4–7.0)
Neutrophils: 69 %
Platelets: 143 10*3/uL — ABNORMAL LOW (ref 150–450)
RBC: 3.87 x10E6/uL — ABNORMAL LOW (ref 4.14–5.80)
RDW: 11.2 % — ABNORMAL LOW (ref 11.6–15.4)
WBC: 4.9 10*3/uL (ref 3.4–10.8)

## 2022-12-15 ENCOUNTER — Ambulatory Visit (HOSPITAL_COMMUNITY)
Admission: RE | Admit: 2022-12-15 | Discharge: 2022-12-15 | Disposition: A | Discharge status: Home / Self Care | Payer: Medicare HMO | Source: Ambulatory Visit | Attending: Family Medicine | Admitting: Family Medicine

## 2022-12-15 DIAGNOSIS — J449 Chronic obstructive pulmonary disease, unspecified: Secondary | ICD-10-CM | POA: Diagnosis not present

## 2022-12-15 DIAGNOSIS — R634 Abnormal weight loss: Secondary | ICD-10-CM | POA: Insufficient documentation

## 2022-12-15 DIAGNOSIS — R0609 Other forms of dyspnea: Secondary | ICD-10-CM | POA: Diagnosis not present

## 2022-12-15 DIAGNOSIS — J90 Pleural effusion, not elsewhere classified: Secondary | ICD-10-CM | POA: Diagnosis not present

## 2022-12-15 DIAGNOSIS — R059 Cough, unspecified: Secondary | ICD-10-CM | POA: Diagnosis not present

## 2022-12-15 DIAGNOSIS — R0602 Shortness of breath: Secondary | ICD-10-CM | POA: Diagnosis not present

## 2022-12-15 DIAGNOSIS — I5022 Chronic systolic (congestive) heart failure: Secondary | ICD-10-CM | POA: Diagnosis not present

## 2022-12-18 ENCOUNTER — Other Ambulatory Visit: Payer: Self-pay

## 2022-12-18 NOTE — Telephone Encounter (Signed)
Daughter called back and informed of patient's lab results , she is requesting for a refill of his inhaler to be sent to [facility] pharmacy.

## 2023-01-04 ENCOUNTER — Other Ambulatory Visit: Payer: Self-pay | Admitting: Family Medicine

## 2023-01-05 ENCOUNTER — Telehealth: Payer: Self-pay | Admitting: Family Medicine

## 2023-01-05 NOTE — Telephone Encounter (Signed)
I did look at the chest x-ray He does have some age-related changes Has changes in the lung consistent with mild COPD which can occur with getting older it is not severe.  There is also a minimal amount of pleural effusion but not enough to try to pull off or drain.  I believe the majority of his shortness of breath is related to his underlying heart not squeezing as good as it used to compared to years ago.  I would not recommend adding additional medicine right now but I do recommend a follow-up visit with me somewhere within the next 3 to 4 weeks.  Thanks-Dr. Lorin Picket  Feel free to let me know if any other issues

## 2023-01-05 NOTE — Telephone Encounter (Signed)
Results of chest x-ray

## 2023-01-06 NOTE — Telephone Encounter (Signed)
Results discussed with daughter Ava(DPR). Per Dr Lorin Picket:  I did look at the chest x-ray He does have some age-related changes Has changes in the lung consistent with mild COPD which can occur with getting older it is not severe.  There is also a minimal amount of pleural effusion but not enough to try to pull off or drain.  I believe the majority of his shortness of breath is related to his underlying heart not squeezing as good as it used to compared to years ago.  I would not recommend adding additional medicine right now but I do recommend a follow-up visit with me somewhere within the next 3 to 4 weeks  Ava verbalized understanding.

## 2023-01-11 ENCOUNTER — Telehealth: Payer: Self-pay

## 2023-01-11 ENCOUNTER — Other Ambulatory Visit: Payer: Self-pay | Admitting: Family Medicine

## 2023-01-11 MED ORDER — ALBUTEROL SULFATE HFA 108 (90 BASE) MCG/ACT IN AERS: 2.0000 | INHALATION_SPRAY | RESPIRATORY_TRACT | 0 refills | Status: DC | PRN

## 2023-01-11 NOTE — Telephone Encounter (Signed)
Called and spoke with daughter and advised per Dr Lorin Picket. patient having shortness of breath can be worrisome because he has a history of congestive heart failure.  If he is coughing and wheezing albuterol can be used to try to help with wheezing.  But if it is more of feeling short winded especially if laying down or sitting still he is short winded he should go to the ER if it is just occasional short windedness that comes and goes I would recommend an office visit this week  Daughter verbalized understanding.

## 2023-01-11 NOTE — Telephone Encounter (Signed)
Called patient and left message for patient to call office

## 2023-01-11 NOTE — Telephone Encounter (Signed)
Today nurses-patient having shortness of breath can be worrisome because he has a history of congestive heart failure.  If he is coughing and wheezing albuterol can be used to try to help with wheezing.  But if it is more of feeling short winded especially if laying down or sitting still he is short winded he should go to the ER if it is just occasional short windedness that comes and goes I would recommend an office visit this week

## 2023-01-11 NOTE — Telephone Encounter (Signed)
Daughter called and states that patient is having SOB, patient states he is fine.  Daughter states he is out of his inhalers and needs refills. Daughter would like to know if they can be delivered today. Informed daughter if patient is getting worse to dial 911. If she is unable to take him to the ED.

## 2023-01-15 ENCOUNTER — Other Ambulatory Visit: Payer: Self-pay | Admitting: Family Medicine

## 2023-01-18 ENCOUNTER — Telehealth: Payer: Self-pay | Admitting: Internal Medicine

## 2023-01-18 NOTE — Telephone Encounter (Signed)
I returned call to patient's daughter.  She reports call back should go to patient.  His number is [phone].  I asked patient's daughter if patient was having chest pain and she said a little bit.  I told her patient should go to ED if having pain.  She asked me to contact patient.  I spoke with patient. He reports chest pain off and on for the last week.  He is currently having some chest pain.  I advised patient to call 911 for transport to the ED

## 2023-01-18 NOTE — Telephone Encounter (Signed)
Daughter called to say that the patient is experiencing chest pain. He called her to tel her to call our office to see what he needs to do. Please advise

## 2023-01-20 ENCOUNTER — Emergency Department (HOSPITAL_COMMUNITY): Payer: Medicare HMO

## 2023-01-20 ENCOUNTER — Encounter (HOSPITAL_COMMUNITY): Payer: Self-pay | Admitting: *Deleted

## 2023-01-20 ENCOUNTER — Emergency Department (HOSPITAL_COMMUNITY)
Admission: EM | Admit: 2023-01-20 | Discharge: 2023-01-20 | Disposition: A | Discharge status: Home / Self Care | Payer: Medicare HMO | Attending: Emergency Medicine | Admitting: Emergency Medicine

## 2023-01-20 ENCOUNTER — Other Ambulatory Visit: Payer: Self-pay

## 2023-01-20 DIAGNOSIS — I509 Heart failure, unspecified: Secondary | ICD-10-CM

## 2023-01-20 DIAGNOSIS — R0789 Other chest pain: Secondary | ICD-10-CM | POA: Diagnosis not present

## 2023-01-20 DIAGNOSIS — I5020 Unspecified systolic (congestive) heart failure: Secondary | ICD-10-CM | POA: Diagnosis not present

## 2023-01-20 DIAGNOSIS — R7989 Other specified abnormal findings of blood chemistry: Secondary | ICD-10-CM | POA: Insufficient documentation

## 2023-01-20 DIAGNOSIS — R0602 Shortness of breath: Secondary | ICD-10-CM | POA: Diagnosis not present

## 2023-01-20 DIAGNOSIS — I110 Hypertensive heart disease with heart failure: Secondary | ICD-10-CM | POA: Diagnosis not present

## 2023-01-20 DIAGNOSIS — I130 Hypertensive heart and chronic kidney disease with heart failure and stage 1 through stage 4 chronic kidney disease, or unspecified chronic kidney disease: Secondary | ICD-10-CM | POA: Insufficient documentation

## 2023-01-20 DIAGNOSIS — N183 Chronic kidney disease, stage 3 unspecified: Secondary | ICD-10-CM | POA: Diagnosis not present

## 2023-01-20 DIAGNOSIS — Z7982 Long term (current) use of aspirin: Secondary | ICD-10-CM | POA: Insufficient documentation

## 2023-01-20 DIAGNOSIS — J90 Pleural effusion, not elsewhere classified: Secondary | ICD-10-CM

## 2023-01-20 DIAGNOSIS — I517 Cardiomegaly: Secondary | ICD-10-CM | POA: Diagnosis not present

## 2023-01-20 LAB — HEPATIC FUNCTION PANEL
ALT: 31 U/L (ref 0–44)
AST: 39 U/L (ref 15–41)
Albumin: 3.8 g/dL (ref 3.5–5.0)
Alkaline Phosphatase: 55 U/L (ref 38–126)
Bilirubin, Direct: 0.3 mg/dL — ABNORMAL HIGH (ref 0.0–0.2)
Indirect Bilirubin: 2 mg/dL — ABNORMAL HIGH (ref 0.3–0.9)
Total Bilirubin: 2.3 mg/dL — ABNORMAL HIGH (ref <1.2)
Total Protein: 6.9 g/dL (ref 6.5–8.1)

## 2023-01-20 LAB — CBC
HCT: 39.9 % (ref 39.0–52.0)
Hemoglobin: 12.4 g/dL — ABNORMAL LOW (ref 13.0–17.0)
MCH: 31.8 pg (ref 26.0–34.0)
MCHC: 31.1 g/dL (ref 30.0–36.0)
MCV: 102.3 fL — ABNORMAL HIGH (ref 80.0–100.0)
Platelets: 126 10*3/uL — ABNORMAL LOW (ref 150–400)
RBC: 3.9 MIL/uL — ABNORMAL LOW (ref 4.22–5.81)
RDW: 12.3 % (ref 11.5–15.5)
WBC: 4.2 10*3/uL (ref 4.0–10.5)
nRBC: 0 % (ref 0.0–0.2)

## 2023-01-20 LAB — BRAIN NATRIURETIC PEPTIDE: B Natriuretic Peptide: 761 pg/mL — ABNORMAL HIGH (ref 0.0–100.0)

## 2023-01-20 LAB — BASIC METABOLIC PANEL
Anion gap: 10 (ref 5–15)
BUN: 14 mg/dL (ref 8–23)
CO2: 25 mmol/L (ref 22–32)
Calcium: 9.3 mg/dL (ref 8.9–10.3)
Chloride: 104 mmol/L (ref 98–111)
Creatinine, Ser: 1.22 mg/dL (ref 0.61–1.24)
GFR, Estimated: 55 mL/min — ABNORMAL LOW (ref >60)
Glucose, Bld: 125 mg/dL — ABNORMAL HIGH (ref 70–99)
Potassium: 4.1 mmol/L (ref 3.5–5.1)
Sodium: 139 mmol/L (ref 135–145)

## 2023-01-20 LAB — TROPONIN I (HIGH SENSITIVITY)
Troponin I (High Sensitivity): 13 ng/L (ref <18)
Troponin I (High Sensitivity): 12 ng/L (ref <18)

## 2023-01-20 MED ORDER — FUROSEMIDE 20 MG PO TABS: 20.0000 mg | ORAL_TABLET | Freq: Two times a day (BID) | ORAL | 0 refills | Status: DC

## 2023-01-20 MED ORDER — FUROSEMIDE 10 MG/ML IJ SOLN
20.0000 mg | Freq: Once | INTRAMUSCULAR | Status: AC
  Administered 2023-01-20: 20 mg via INTRAVENOUS
  Filled 2023-01-20: qty 2

## 2023-01-20 NOTE — ED Provider Notes (Signed)
[facility] AT [facility] Provider Note   CSN: [id number] Arrival date & time: 01/20/23  0830     History  Chief Complaint  Patient presents with   Shortness of Breath    [name] is a 89 y.o. male.  He has PMH of GERD, anemia, gout, hypertension, stage III CKD, left bundle branch block, HFrEF.  He presents to the ER for 2 to 3 weeks of worsening chest pain shortness of breath on exertion.  States that over the past couple days it seems to be little worse and this morning he woke up with some right lower quadrant abdominal pain which now has resolved, he thinks that may have been a pulled muscle because he lifted something heavy up last night.  Denies increased swelling of his legs.  Denies cough or fevers.  No palpitations.   Shortness of Breath      Home Medications Prior to Admission medications   Medication Sig Start Date End Date Taking? Authorizing Provider  acetaminophen (TYLENOL) 650 MG CR tablet Take 650-1,300 mg by mouth every 8 (eight) hours as needed for pain.    [provider]  albuterol (VENTOLIN HFA) 108 (90 Base) MCG/ACT inhaler Inhale 2 puffs into the lungs every 4 (four) hours as needed for wheezing or shortness of breath. 01/11/23   Babs Sciara, MD  aspirin 81 MG chewable tablet Chew 1 tablet (81 mg total) by mouth daily. 03/18/22   Catarina Hartshorn, MD  baclofen (LIORESAL) 10 MG tablet Take 0.5 tablets (5 mg total) by mouth 3 (three) times daily. Prn muscle spasms 10/14/22   Campbell Riches, NP  dapagliflozin propanediol (FARXIGA) 5 MG TABS tablet Take 1 tablet (5 mg total) by mouth daily. 12/10/22   Luking, Jonna Coup, MD  FARXIGA 10 MG TABS tablet TAKE (1) TABLET BY MOUTH ONCE DAILY. 01/06/23   Babs Sciara, MD  furosemide (LASIX) 20 MG tablet Take 1 tablet (20 mg total) by mouth daily. 05/22/22   Babs Sciara, MD  losartan (COZAAR) 25 MG tablet Take 1 tablet (25 mg total) by mouth daily. 01/15/23   Babs Sciara, MD  mupirocin ointment (BACTROBAN) 2 % Apply 1 Application topically 2 (two) times daily. 09/22/22   Babs Sciara, MD  predniSONE (DELTASONE) 20 MG tablet Take 1 tablet (20 mg total) by mouth daily with breakfast. For inflammation Patient not taking: Reported on 12/10/2022 10/08/22   Rodriguez-Southworth, Nettie Elm, PA-C  rosuvastatin (CRESTOR) 20 MG tablet TAKE (1) TABLET BY MOUTH ONCE DAILY. 01/06/23   Babs Sciara, MD  spironolactone (ALDACTONE) 25 MG tablet Take 0.5 tablets (12.5 mg total) by mouth daily. 11/11/22   Babs Sciara, MD      Allergies    Penicillin g benzathine and Penicillins    Review of Systems   Review of Systems  Respiratory:  Positive for shortness of breath.     Physical Exam Updated Vital Signs BP 127/84   Pulse 83   Temp 97.9 F (36.6 C) (Oral)   Resp 20   Ht 5\' 10"  (1.778 m)   Wt 65.3 kg   SpO2 99%   BMI 20.66 kg/m  Physical Exam Vitals and nursing note reviewed.  Constitutional:      General: He is not in acute distress.    Appearance: He is well-developed.  HENT:     Head: Normocephalic and atraumatic.  Eyes:     Conjunctiva/sclera: Conjunctivae normal.  Cardiovascular:  Rate and Rhythm: Normal rate and regular rhythm.     Heart sounds: No murmur heard. Pulmonary:     Effort: Pulmonary effort is normal. No respiratory distress.     Breath sounds: Normal breath sounds.  Abdominal:     Palpations: Abdomen is soft.     Tenderness: There is no abdominal tenderness.  Musculoskeletal:        General: No swelling. Normal range of motion.     Cervical back: Neck supple.     Right lower leg: No tenderness. No edema.     Left lower leg: No tenderness. No edema.  Skin:    General: Skin is warm and dry.     Capillary Refill: Capillary refill takes less than 2 seconds.  Neurological:     Mental Status: He is alert.  Psychiatric:        Mood and Affect: Mood normal.     ED Results / Procedures / Treatments   Labs (all labs ordered  are listed, but only abnormal results are displayed) Labs Reviewed  BASIC METABOLIC PANEL  CBC  TROPONIN I (HIGH SENSITIVITY)    EKG EKG Interpretation Date/Time:  Wednesday January 20 2023 09:02:22 EST Ventricular Rate:  78 PR Interval:  192 QRS Duration:  140 QT Interval:  470 QTC Calculation: 535 R Axis:   54  Text Interpretation: Normal sinus rhythm Left bundle branch block Abnormal ECG When compared with ECG of 15-Mar-2022 06:07, Vent. rate has increased BY  39 BPM Left bundle branch block is now Present Confirmed by Eber Hong ([id number]) on 01/20/2023 9:12:09 AM  Radiology No results found.  Procedures Procedures    Medications Ordered in ED Medications - No data to display  ED Course/ Medical Decision Making/ A&P Clinical Course as of 01/20/23 1848  Wed Jan 20, 2023  1237 Discussed with patient and his family at bedside that plan was to admit him for heart failure exacerbation.  He got a dose of Lasix and is put out about 1200 cc of urine so far and states he is feeling better.  Refusing admission at this time, would like medications adjusted at home and follow-up with cardiology outpatient.  Patient is well-appearing, not hypoxic, not tachycardic, troponins are negative.  Feel this is reasonable. [CB]    Clinical Course User Index [CB] Ma Rings, PA-C                                 Medical Decision Making This patient presents to the ED for concern of exertional dyspnea and chest pain, this involves an extensive number of treatment options, and is a complaint that carries with it a high risk of complications and morbidity.  The differential diagnosis includes ACS, pneumonia, PE, pneumothorax, pleural effusion, heart failure exacerbation, pericarditis, pericardial effusion,   Co morbidities that complicate the patient evaluation :   Heart failure, CKD, hypertension, left bundle branch block   Additional history obtained:  Additional history obtained  from EMR External records from outside source obtained and reviewed including notes   Lab Tests:  I Ordered, and personally interpreted labs.  The pertinent results include: BNP elevated, troponins flat, bilirubin elevated but at baseline, no leukocytosis   Imaging Studies ordered:  I ordered imaging studies including x-ray which shows bilateral pleural effusions I independently visualized and interpreted imaging within scope of identifying emergent findings  I agree with the radiologist interpretation   Cardiac Monitoring: /  EKG:  The patient was maintained on a cardiac monitor.  I personally viewed and interpreted the cardiac monitored which showed an underlying rhythm of: Sinus rhythm with left bundle branch block    Problem List / ED Course / Critical interventions / Medication management   This with several weeks of exertional chest pain and shortness of breath that seems of gotten little bit worse, history of HFrEF, EKG showed left bundle branch block, patient has known history of this, vitals are normal, chest x-ray shows bilateral pleural effusions similar to previous.  Patient has bilateral pleural effusions, BNP elevated.  Plan to diurese and admit for further diuresis and cardiology consult but patient and family refused, they want to go home, advised doubling Lasix x 3 days and close cardiology follow-up.  They are agreeable with this, given strict return precautions.  They were unsure if he had additional Lasix at home so was given a prescription out for several extra tablets and it was explained to family I prescribed extra only to use if he is out at home. I ordered medication including Lasix for heart failure Reevaluation of the patient after these medicines showed that the patient improved I have reviewed the patients home medicines and have made adjustments as needed     Amount and/or Complexity of Data Reviewed Labs: ordered. Radiology:  ordered.  Risk Prescription drug management.           Final Clinical Impression(s) / ED Diagnoses Final diagnoses:  None    Rx / DC Orders ED Discharge Orders     None         Josem Kaufmann 01/20/23 1851    Eber Hong, MD 01/20/23 2023

## 2023-01-20 NOTE — ED Notes (Signed)
Pt sitting up in bed, pt denies pain, pt states that he is ready to go home, pt from department with family.

## 2023-01-20 NOTE — ED Triage Notes (Signed)
Pt c/o sob x 2-3 weeks and states it is intermittent but the last couple of days it did not resolve  Pt only c/o pain when he walks; pt states the pain is under his ribcage

## 2023-01-20 NOTE — Discharge Instructions (Addendum)
You are seen in the ER today for chest pain and shortness of breath.  Your workup does not show any sign of heart attack but does suggest that you have some fluid in your lungs which is likely causing the shortness of breath.  This is probably due to your heart failure.  We recommended admission to the hospital.  If admitted we would monitor your heart and give you additional diuretics, and likely have you see the heart doctor.  Since you are not willing to stay, I recommend you take 2 tablets of your Lasix (furosemide) for the next 3 days.  You already had an additional dose through the IV in the ER today so no need to take additional dose today.  Please call your cardiologist today to schedule close follow-up.  If you develop any new or worsening symptoms please come back to the ER right away.  You are given a prescription for Lasix that you can pick up only if you are out of your home Lasix.

## 2023-01-21 ENCOUNTER — Telehealth: Payer: Self-pay | Admitting: *Deleted

## 2023-01-21 NOTE — Telephone Encounter (Signed)
CRM # [phone] [phone] Owner: None Status: Resolved Priority: Routine Created on: 01/21/2023 11:56 AM By: Britta Mccreedy   Primary Information  Source  [name] (Child)   Subject  [name] (Patient)   Topic  Clinical - Prescription Issue    Communication  Most Recent Primary Care Visit:  Provider: Lilyan Punt A     Department: [facility]     Visit Type: OFFICE VISIT     Date: 12/10/2022        Medication: All medications prescribed by Dr. Gerda Diss.        Patient's daughter [name] would like all medications mailed DPR representative daughter Shon Hough at [address]    [place], [address].        Please call patient's daughter to facilitate. Please assist.        Has the patient contacted their pharmacy? No    (Agent: If no, request that the patient contact the pharmacy for the refill. If patient does not wish to contact the pharmacy document the reason why and proceed with request.)    (Agent: If yes, when and what did the pharmacy advise?)        Is this the correct pharmacy for this prescription? No    If no, delete pharmacy and type the correct one.    This is the patient's preferred pharmacy:    [facility] [place], [place] - [id number]    [address]    [place] [address]    Phone: [phone] Fax: [phone]            Has the prescription been filled recently? Yes        Is the patient out of the medication? Yes        Has the patient been seen for an appointment in the last year OR does the patient have an upcoming appointment? Yes        Can we respond through MyChart?        Agent: Please be advised that Rx refills may take up to 3 business days. We ask that you follow-up with your pharmacy.

## 2023-01-21 NOTE — Telephone Encounter (Signed)
Daughter will need to contact pharmacy directly to have medications shipped to her house  Left message to return call

## 2023-01-22 ENCOUNTER — Telehealth: Payer: Self-pay

## 2023-01-22 NOTE — Telephone Encounter (Signed)
Called and left message for DPR [name] to call office. (Nurse Note* Daughter will need to contact pharmacy directly to have medications shipped to her house.)

## 2023-01-25 ENCOUNTER — Ambulatory Visit (INDEPENDENT_AMBULATORY_CARE_PROVIDER_SITE_OTHER): Payer: Medicare HMO | Admitting: Family Medicine

## 2023-01-25 VITALS — BP 121/77 | HR 70 | Ht 70.0 in | Wt 151.4 lb

## 2023-01-25 DIAGNOSIS — I5022 Chronic systolic (congestive) heart failure: Secondary | ICD-10-CM

## 2023-01-25 DIAGNOSIS — N1832 Chronic kidney disease, stage 3b: Secondary | ICD-10-CM | POA: Diagnosis not present

## 2023-01-25 DIAGNOSIS — J90 Pleural effusion, not elsewhere classified: Secondary | ICD-10-CM

## 2023-01-25 NOTE — Progress Notes (Signed)
   Subjective:    Patient ID: [name], male    DOB: [birth year], 89 y.o.   MRN: [id number]  Discussed the use of AI scribe software for clinical note transcription with the patient, who gave verbal consent to proceed.  History of Present Illness   The patient, with a history of heart disease and fluid overload, presents with worsening dyspnea, particularly on exertion. He reports a recent hospital visit where fluid was removed, which initially improved his symptoms. However, he has since experienced a decline in his condition, with increased shortness of breath and decreased energy levels.  The patient's family member notes that the patient has not been taking his medications as prescribed, which may have contributed to the fluid build-up. The patient confirms that he was given medication to increase urination, which he believes has been helpful.  The patient also reports a decrease in leg swelling since his hospital visit but notes a decrease in muscle mass and appetite. Despite these challenges, the patient's breathing is reportedly better when at rest, and he denies any chest pain.  He expresses a desire to avoid hospitalization if possible.         Review of Systems     Objective:    Physical Exam   CHEST: lungs clear to auscultation CARDIOVASCULAR: normal heart sounds EXTREMITIES: muscle atrophy           Assessment & Plan:  Assessment and Plan    Pleural Effusion Shortness of breath, especially on exertion. Chest X-ray shows fluid in the lungs. Discussed the risks/benefits of pleurosynthesis (risk of pneumothorax / relief of symptoms) vs medical management (diuretics). -Continue diuretic therapy. -Coordinate with Dr. Tenny Craw regarding further management. -Consider pleurosynthesis if symptoms worsen.  Medication Compliance Inconsistent use of diuretics leading to fluid accumulation. -Encouraged consistent use of diuretics. -Bring all medications to next  appointment for review.  General Health Maintenance -Order metabolic seven blood test today to assess kidney function in response to diuretics. -Schedule follow-up appointment in 3-4 weeks.     I find no evidence of decompensation currently but I am concerned about the pleural effusion. I am concerned trying to draw off pleural effusion would increase risk of accidental pneumothorax Will alert Dr. Tenny Craw regarding this patient

## 2023-01-26 ENCOUNTER — Other Ambulatory Visit: Payer: Self-pay

## 2023-01-26 DIAGNOSIS — I5022 Chronic systolic (congestive) heart failure: Secondary | ICD-10-CM

## 2023-01-26 DIAGNOSIS — N1832 Chronic kidney disease, stage 3b: Secondary | ICD-10-CM

## 2023-01-26 LAB — BASIC METABOLIC PANEL
BUN/Creatinine Ratio: 13 (ref 10–24)
BUN: 19 mg/dL (ref 10–36)
CO2: 23 mmol/L (ref 20–29)
Calcium: 9.9 mg/dL (ref 8.6–10.2)
Chloride: 104 mmol/L (ref 96–106)
Creatinine, Ser: 1.46 mg/dL — ABNORMAL HIGH (ref 0.76–1.27)
Glucose: 94 mg/dL (ref 70–99)
Potassium: 4.2 mmol/L (ref 3.5–5.2)
Sodium: 146 mmol/L — ABNORMAL HIGH (ref 134–144)
eGFR: 44 mL/min/{1.73_m2} — ABNORMAL LOW (ref >59)

## 2023-01-27 ENCOUNTER — Ambulatory Visit: Payer: Medicare HMO | Attending: Internal Medicine | Admitting: Internal Medicine

## 2023-01-27 ENCOUNTER — Encounter: Payer: Self-pay | Admitting: Internal Medicine

## 2023-01-27 VITALS — BP 124/70 | HR 82 | Ht 70.0 in | Wt 154.0 lb

## 2023-01-27 DIAGNOSIS — I5020 Unspecified systolic (congestive) heart failure: Secondary | ICD-10-CM | POA: Diagnosis not present

## 2023-01-27 NOTE — Patient Instructions (Addendum)
Medication Instructions:  Your physician recommends that you continue on your current medications as directed. Please refer to the Current Medication list given to you today.  *If you need a refill on your cardiac medications before your next appointment, please call your pharmacy*   Lab Work: NONE   If you have labs (blood work) drawn today and your tests are completely normal, you will receive your results only by: MyChart Message (if you have MyChart) OR A paper copy in the mail If you have any lab test that is abnormal or we need to change your treatment, we will call you to review the results.   Testing/Procedures: A chest x-ray takes a picture of the organs and structures inside the chest, including the heart, lungs, and blood vessels. This test can show several things, including, whether the heart is enlarges; whether fluid is building up in the lungs; and whether pacemaker / defibrillator leads are still in place.  To be done Thursday, November 21.   Follow-Up: At [facility], you and your health needs are our priority.  As part of our continuing mission to provide you with exceptional heart care, we have created designated Provider Care Teams.  These Care Teams include your primary Cardiologist (physician) and Advanced Practice Providers (APPs -  Physician Assistants and Nurse Practitioners) who all work together to provide you with the care you need, when you need it.  We recommend signing up for the patient portal called "MyChart".  Sign up information is provided on this After Visit Summary.  MyChart is used to connect with patients for Virtual Visits (Telemedicine).  Patients are able to view lab/test results, encounter notes, upcoming appointments, etc.  Non-urgent messages can be sent to your provider as well.   To learn more about what you can do with MyChart, go to [email or web address].    Your next appointment:    June   Provider:   You may see Dietrich Pates,  MD or one of the following Advanced Practice Providers on your designated Care Team:   Randall An, PA-C  Jacolyn Reedy, PA-C     Other Instructions Thank you for choosing [facility]!

## 2023-01-27 NOTE — Progress Notes (Signed)
Cardiology Office Note   Date:  01/27/2023   ID:  [name], DOB 12-22-1925, MRN [id number]  PCP:  Babs Sciara, MD  Cardiologist:   Dietrich Pates, MD   Pt presents for follow up of HFrEF    History of Present Illness: [name] is a 89 y.o. male with a history of HTN, AAA, atypical CP, HFrEF   I saw him in clinic in 2018   H Pt admitted in Dec 2023 with SOB Echo in Dec 2023 showed LVEF 30 to 35%   Rx with IV lasix   Trop flat at 83, 85.     EKG with T wave inversion and intermitt LBBB  PCT showed coornary calcifications.   Treated medically     I saw the pt in cardiology clinic in July 2024  Since seen the pt was seen in [facility] ER on 01/20/23  PResentted there with increased SOB and exertional CP over 2 to 3 wks    He did says he stopped taking meds  CXR showed bilateral pleural effusions.  He was given lasix IV with improvement   Sent home on lasix   Told to double it for 3 days   Seen by S. Luking this week  ? Need to thoracentesis  Since ER visit the pt says he is feeling much better  Breathing is OK   Triv LE edema     Current Meds  Medication Sig   acetaminophen (TYLENOL) 650 MG CR tablet Take 650-1,300 mg by mouth every 8 (eight) hours as needed for pain.   albuterol (VENTOLIN HFA) 108 (90 Base) MCG/ACT inhaler Inhale 2 puffs into the lungs every 4 (four) hours as needed for wheezing or shortness of breath.   aspirin 81 MG chewable tablet Chew 1 tablet (81 mg total) by mouth daily.   FARXIGA 10 MG TABS tablet TAKE (1) TABLET BY MOUTH ONCE DAILY. (Patient taking differently: Take 10 mg by mouth daily.)   furosemide (LASIX) 20 MG tablet Take 1 tablet (20 mg total) by mouth daily.   furosemide (LASIX) 20 MG tablet Take 1 tablet (20 mg total) by mouth 2 (two) times daily.   losartan (COZAAR) 25 MG tablet Take 1 tablet (25 mg total) by mouth daily.   mupirocin ointment (BACTROBAN) 2 % Apply 1 Application topically 2 (two) times daily.   rosuvastatin  (CRESTOR) 20 MG tablet TAKE (1) TABLET BY MOUTH ONCE DAILY. (Patient taking differently: Take 20 mg by mouth daily.)   spironolactone (ALDACTONE) 25 MG tablet Take 0.5 tablets (12.5 mg total) by mouth daily.     Allergies:   Penicillin g benzathine and Penicillins   Past Medical History:  Diagnosis Date   Asthma    Diverticulosis    GERD (gastroesophageal reflux disease)    Gout    Heart murmur    Evaluation by a cardiologist years ago was reportedly negative   HTN (hypertension)    Osteoarthritis    Reflux     Past Surgical History:  Procedure Laterality Date   APPENDECTOMY  2007   COLONOSCOPY  2003   COLONOSCOPY N/A 12/28/2012   Procedure: COLONOSCOPY;  Surgeon: Malissa Hippo, MD;  Location: [facility];  Service: Endoscopy;  Laterality: N/A;  255-rescheduled to 10:30 Ann notified pt   EYE SURGERY Right    cataract removal   INGUINAL HERNIA REPAIR     Right   JOINT REPLACEMENT     right total knee RIGHT total knee  arthroplasty   TOTAL KNEE ARTHROPLASTY  09/2009   Right     Social History:  The patient  reports that he quit smoking about 54 years ago. His smoking use included pipe. He started smoking about 83 years ago. He has never used smokeless tobacco. He reports that he does not drink alcohol and does not use drugs.   Family History:  The patient's family history includes Arthritis in an other family member; Cancer in his brother; Heart attack in his father; Heart disease in an other family member; Hypertension in his father and mother; Uterine cancer in his maternal aunt.    ROS:  Please see the history of present illness. All other systems are reviewed and  Negative to the above problem except as noted.    PHYSICAL EXAM: VS:  BP 124/70   Pulse 82   Ht 5\' 10"  (1.778 m)   Wt 154 lb (69.9 kg)   SpO2 99%   BMI 22.10 kg/m   GEN: Pt in no acute distress  HEENT: normal  Neck: no JVD, Cardiac: RRR; no murmur   No LE  edema  Respiratory:  clear to  auscultation  Decreased BS at bases GI: soft, nontender, nondistended,  No hepatomegaly  EKG   Not done today    Lipid Panel    Component Value Date/Time   CHOL 128 08/24/2022 1206   TRIG 54 08/24/2022 1206   HDL 79 08/24/2022 1206   CHOLHDL 1.6 08/24/2022 1206   CHOLHDL 2.9 01/11/2014 0928   VLDL 23 01/11/2014 0928   LDLCALC 37 08/24/2022 1206      Wt Readings from Last 3 Encounters:  01/27/23 154 lb (69.9 kg)  01/25/23 151 lb 6.4 oz (68.7 kg)  01/20/23 144 lb (65.3 kg)      ASSESSMENT AND PLAN:  1  HFrEF   Pt with LVEF 30 to 35%   Pt had been doing well on medical regimen but it appears he had stopped  Back on meds now   Feeling better   Would continue with same meds    Reassess CXR in 1 week to follow for changes in pleural effusion Plan to continue meical Rx     2  Hx CP   Pt with coronary calcifications Myoview in 2018 normal perfusion  Doing better now after diuresis .  3  HL  LDL 37  HDL 79   June 2024    Follow up in summer    Current medicines are reviewed at length with the patient today.  The patient does not have concerns regarding medicines.  Signed, Dietrich Pates, MD  01/27/2023 2:22 PM    [facility] [address] [place], [place], [place]  [address] Phone: [phone]; Fax: [phone]

## 2023-02-04 ENCOUNTER — Other Ambulatory Visit: Payer: Self-pay | Admitting: Family Medicine

## 2023-02-08 ENCOUNTER — Telehealth: Payer: Self-pay

## 2023-02-08 NOTE — Telephone Encounter (Signed)
Transition Care Management Unsuccessful Follow-up Telephone Call  Date of discharge and from where:  [facility]  Attempts:  1st Attempt  Reason for unsuccessful TCM follow-up call:  No answer/busy   Lenard Forth [place]  [facility], [facility] Guide, Phone: [phone] Website: [email or web address]

## 2023-02-09 ENCOUNTER — Telehealth: Payer: Self-pay

## 2023-02-09 NOTE — Telephone Encounter (Signed)
Transition Care Management Unsuccessful Follow-up Telephone Call  Date of discharge and from where:  [name] 11/6  Attempts:  2nd Attempt  Reason for unsuccessful TCM follow-up call:  No answer/busy   Lenard Forth [place]  [facility], [facility] Guide, Phone: [phone] Website: [email or web address]

## 2023-02-14 ENCOUNTER — Other Ambulatory Visit: Payer: Self-pay | Admitting: Family Medicine

## 2023-02-19 ENCOUNTER — Other Ambulatory Visit: Payer: Self-pay | Admitting: Family Medicine

## 2023-02-23 ENCOUNTER — Ambulatory Visit (INDEPENDENT_AMBULATORY_CARE_PROVIDER_SITE_OTHER): Payer: Medicare HMO | Admitting: Family Medicine

## 2023-02-23 VITALS — BP 125/66 | Ht 70.0 in | Wt 156.0 lb

## 2023-02-23 DIAGNOSIS — J90 Pleural effusion, not elsewhere classified: Secondary | ICD-10-CM

## 2023-02-23 DIAGNOSIS — R0609 Other forms of dyspnea: Secondary | ICD-10-CM

## 2023-02-23 NOTE — Progress Notes (Signed)
   Subjective:    Patient ID: [name], male    DOB: [birth year], 89 y.o.   MRN: [id number]  HPI Patient arrives for a follow up on dyspnea Discussed the use of AI scribe software for clinical note transcription with the patient, who gave verbal consent to proceed.  History of Present Illness   The patient, with an unspecified medical history, has been experiencing difficulty with breathing, particularly when moving around. He reports that his breathing becomes more labored when he moves faster. Despite this, he is still able to move around his house and go outside when the weather permits. He has noticed no significant changes in his breathing when walking short distances, such as from his house to his car.  The patient's appetite remains good, but he has been experiencing irregular bowel movements, occurring only about two times a week. He denies any presence of blood in his stool. To manage this, he has been taking Melalec and Metamucil, which he reports as being somewhat effective.  He also mentions a previous chest x-ray from November, which showed a small amount of fluid in his lungs. The patient did not report any follow-up or treatment related to this finding.       Review of Systems     Objective:   Physical Exam Physical Exam   CHEST: Breath sounds normal upon auscultation. EXTREMITIES: No abnormalities noted on inspection of legs.     General-in no acute distress Eyes-no discharge Lungs-respiratory rate normal, CTA CV-no murmurs,RRR Extremities skin warm dry minimal edema in the ankles Neuro grossly normal Behavior normal, alert        Assessment & Plan:  Assessment and Plan    Chronic Constipation Infrequent bowel movements (2 times per week). No blood in stool. Currently using Miralax and Metamucil. Discussed the importance of hydration and fiber in diet. -Increase fiber intake through diet and continue Metamucil. -Continue Miralax daily as  needed. -Consider occasional use of Senokot for bowel stimulation.  Respiratory Status Mild dyspnea on exertion, but able to ambulate independently. No acute distress. Previous imaging showed small pleural effusions. -Order follow-up chest x-ray to assess for resolution or progression of pleural effusions.  Follow-up Next appointment scheduled for January 28th. -Continue current management and reassess at next visit.

## 2023-03-02 ENCOUNTER — Ambulatory Visit (HOSPITAL_COMMUNITY)
Admission: RE | Admit: 2023-03-02 | Discharge: 2023-03-02 | Disposition: A | Discharge status: Home / Self Care | Payer: Medicare HMO | Source: Ambulatory Visit | Attending: Family Medicine | Admitting: Family Medicine

## 2023-03-02 DIAGNOSIS — J90 Pleural effusion, not elsewhere classified: Secondary | ICD-10-CM | POA: Diagnosis not present

## 2023-03-02 DIAGNOSIS — R918 Other nonspecific abnormal finding of lung field: Secondary | ICD-10-CM | POA: Diagnosis not present

## 2023-03-02 DIAGNOSIS — R0609 Other forms of dyspnea: Secondary | ICD-10-CM | POA: Diagnosis not present

## 2023-03-02 DIAGNOSIS — R059 Cough, unspecified: Secondary | ICD-10-CM | POA: Diagnosis not present

## 2023-03-02 DIAGNOSIS — R0602 Shortness of breath: Secondary | ICD-10-CM | POA: Diagnosis not present

## 2023-04-06 ENCOUNTER — Other Ambulatory Visit: Payer: Self-pay | Admitting: Family Medicine

## 2023-04-13 ENCOUNTER — Ambulatory Visit (INDEPENDENT_AMBULATORY_CARE_PROVIDER_SITE_OTHER): Payer: Medicare HMO | Admitting: Family Medicine

## 2023-04-13 VITALS — BP 118/69 | HR 72 | Temp 98.4°F | Ht 70.0 in | Wt 155.0 lb

## 2023-04-13 DIAGNOSIS — N1832 Chronic kidney disease, stage 3b: Secondary | ICD-10-CM | POA: Diagnosis not present

## 2023-04-13 DIAGNOSIS — R600 Localized edema: Secondary | ICD-10-CM | POA: Diagnosis not present

## 2023-04-13 DIAGNOSIS — I10 Essential (primary) hypertension: Secondary | ICD-10-CM | POA: Diagnosis not present

## 2023-04-13 DIAGNOSIS — I5022 Chronic systolic (congestive) heart failure: Secondary | ICD-10-CM | POA: Diagnosis not present

## 2023-04-13 DIAGNOSIS — G542 Cervical root disorders, not elsewhere classified: Secondary | ICD-10-CM

## 2023-04-13 DIAGNOSIS — I7143 Infrarenal abdominal aortic aneurysm, without rupture: Secondary | ICD-10-CM | POA: Diagnosis not present

## 2023-04-13 NOTE — Progress Notes (Signed)
   Subjective:    Patient ID: [name], male    DOB: Nov 17, 1925, 89 y.o.   MRN: [id number]  Discussed the use of AI scribe software for clinical note transcription with the patient, who gave verbal consent to proceed.  History of Present Illness   He is a 89 year old male who presents with neck pain and numbness in the hands.  He experiences neck pain primarily on the left side, radiating into the trapezius area and extending down to the middle of the arm. He describes numbness in his hands and difficulty holding small items, although he maintains good hand strength. He manages the pain with Tylenol, which provides some relief.  He has a persistent issue on his buttocks, present for ten months. He continues to use a cream provided previously, which offers some relief, but he still experiences discomfort, especially when sitting.  He has a history of an abdominal aortic aneurysm and iliac artery enlargement, identified in an ultrasound conducted in January 2022. He is due for a follow-up ultrasound to monitor these conditions.  He is active, walking regularly as advised. He maintains a good mood and reports eating well, including having pancakes and cornflakes for breakfast. He is proud of his ability to remain active and independent at his age, including driving.     History of chronic kidney disease previously stable Lab work needs to be ordered Continue medication  History of hyperlipidemia takes his medicine tries to eat relatively healthy    Review of Systems     Objective:    Physical Exam   CARDIOVASCULAR: Normal heart sounds. MUSCULOSKELETAL: Tenderness in left trapezius muscle. NEUROLOGICAL: Decreased sensation in hands. SKIN: Blood spot on back. no ulcer on buttocks           Assessment & Plan:  Assessment and Plan    Cervical Radiculopathy Left-sided neck pain radiating to mid-arm with associated numbness in hands. Strength in hands is preserved. Likely  secondary to cervical arthritis. -Continue Tylenol as needed for pain.  History of Pressure Ulcer Chronic pressure ulcer on buttocks that is currently healed. No signs of infection. Likely due to loss of muscle mass and pressure on bony prominences. -Continue with topical treatment. -Ensure adequate cushioning on seats.  Abdominal Aortic Aneurysm Enlargement of the aorta and iliac artery noted on ultrasound in January 2022. No current symptoms. -Schedule repeat abdominal ultrasound for surveillance.  General Health Maintenance -Complete blood work and urine test in February 2025. -Schedule follow-up office visit in four months.     Patient with chronic kidney disease is taking diuretic as well as losartan and Farxiga Blood pressure under good control Need to recheck kidney functions Patient does take his cholesterol medicine regular basis do not need to do additional lab work currently  To do lab work in February with follow-up office visit in 4 months Ultrasound ordered await the results of this

## 2023-04-19 ENCOUNTER — Telehealth: Payer: Self-pay | Admitting: *Deleted

## 2023-04-19 NOTE — Telephone Encounter (Signed)
Copied from CRM [id number]. Topic: General - Other >> Apr 19, 2023 11:50 AM Prudencio Pair wrote: Reason for CRM: Ava, daughter of pt, wants to speak with nurse. States she wants to [facility] who made the appt for her dad on February 6th. She states they need to cancel it because they have to get kids on the bus & he really has no way there. She wants him to go somewhere in [place] instead. Ava states her dad does not know anything about the appt. Please give her a call back at [phone].

## 2023-04-19 NOTE — Telephone Encounter (Signed)
Patient scheduled for Aortic Ultrasound tomorrow am- Daughter stated she is going to call and reschedule to a better day and time

## 2023-04-22 ENCOUNTER — Other Ambulatory Visit: Payer: Medicare HMO

## 2023-04-23 ENCOUNTER — Other Ambulatory Visit: Payer: Self-pay | Admitting: Family Medicine

## 2023-04-25 ENCOUNTER — Other Ambulatory Visit: Payer: Self-pay | Admitting: Family Medicine

## 2023-04-26 ENCOUNTER — Ambulatory Visit: Payer: Medicare HMO | Admitting: Family Medicine

## 2023-05-14 ENCOUNTER — Other Ambulatory Visit (HOSPITAL_COMMUNITY): Payer: Medicare HMO

## 2023-05-28 ENCOUNTER — Ambulatory Visit (HOSPITAL_COMMUNITY)
Admission: RE | Admit: 2023-05-28 | Discharge: 2023-05-28 | Disposition: A | Discharge status: Home / Self Care | Payer: Medicare HMO | Source: Ambulatory Visit | Attending: Family Medicine | Admitting: Family Medicine

## 2023-05-28 DIAGNOSIS — I7143 Infrarenal abdominal aortic aneurysm, without rupture: Secondary | ICD-10-CM | POA: Insufficient documentation

## 2023-05-30 ENCOUNTER — Encounter: Payer: Self-pay | Admitting: Family Medicine

## 2023-05-30 NOTE — Progress Notes (Signed)
 Please mail to the patient he does not regularly use MyChart

## 2023-06-12 ENCOUNTER — Other Ambulatory Visit: Payer: Self-pay | Admitting: Family Medicine

## 2023-06-18 ENCOUNTER — Other Ambulatory Visit: Payer: Self-pay | Admitting: Family Medicine

## 2023-06-20 NOTE — Telephone Encounter (Signed)
 Previously emergency department ordered Lasix twice daily Previous to that our prescription was once daily Please verify with patient orHis daughter [name] regarding which dosage is correct in other words which 1 is he currently utilizing once per day or twice per day?  Once you verify that he may have 30-day supply with 6 refills to reflect how he is taking it

## 2023-07-27 ENCOUNTER — Other Ambulatory Visit: Payer: Self-pay | Admitting: Family Medicine

## 2023-08-12 ENCOUNTER — Ambulatory Visit: Payer: Medicare HMO | Admitting: Family Medicine

## 2023-09-01 NOTE — Progress Notes (Unsigned)
 Cardiology Office Note:  .   Date:  09/02/2023  ID:  [name], DOB 11-15-1925, MRN [id number] PCP: Bennet Brasil, MD  [facility] Providers Cardiologist:  Ola Berger, MD { History of Present Illness: .   [name] is a 89 y.o. male  with PMHx of atypical chest pain with Normal Lexiscan  2018, HFrEF (02/2022: 30-35%), HTN, HLD, AAA, reports to office for follow up.   [facility] ER on 01/20/2023 for increased SOB and exertional CP x 2-3 weeks. Noted stopping medications. CXR showed bilateral pleural effusions. Treated with IV lasix  and discharged on PO lasix  with double dose x 3 days.  Last seen in heartcare 01/27/2023 with Dr. Avanell Bob for hospital follow up. Reported significant improvement since ER visit and back on medications. No medications changes. Follow up CXR showed improvement in pleural effusion.   Today, patient is accompanied by daughter. This morning had a episode of SOB after moving equipment in yard while someone was cutting the grass that was relieved by inhaler. No other recent episode of SOB. Reports chronic edema x years that is relieved with leg elevations. Has not tried compression stockings and prefers to decline those. Reports chronic CP x years that has been changed, unable to describe it, last for 1 minute, 1/10, located in mid sternal, associated with exertion, and less severe than previous episodes. Reports neck pain since June 20, 2023 previously assessed by PCP who reccommended Tylenol . Reports being under a lot of stress since wife of 70 years died about 1-2 months ago. Reports chronic dizziness but relates to vertigo. Denies orthopnea, PND, palpitations, syncope.   He is able to ambulate with cane. His granddaughter live with him but he is able to do ADL's without assistance. Reports medication compliance with help from daughter who is a Engineer, civil (consulting). He eats whatever he wants. Able to walk around grocery store using cart as assistance without any concerns. Denies  tobacco use/Binging ETOH/drug use.  Denies any hospitalizations or visits to the emergency department.   ROS: 10 point review of system has been reviewed and considered negative except ones been listed in the HPI.   Studies Reviewed: Aaron Aas   AAA Duplex 06/20/23 Summary:  Abdominal Aorta: There is evidence of abnormal dilatation of the mid  Abdominal aorta. The largest aortic measurement is 3.1 cm. The largest  aortic diameter remains essentially unchanged compared to prior exam.  Previous diameter measurement was 3.0 cm  obtained on 03/20/2020.   Stenosis:  Widely patent bilateral common and external iliac arteries without  evidence of focal stenosis.   IVC/Iliac: Patent IVC.   ECHO IMPRESSIONS 02/2022  1. Left ventricular ejection fraction, by estimation, is 30 to 35%. The  left ventricle has moderately decreased function. The left ventricle  demonstrates global hypokinesis. The left ventricular internal cavity size  was moderately dilated. Left  ventricular diastolic parameters were normal.   2. Right ventricular systolic function is mildly reduced. The right  ventricular size is mildly enlarged. There is mildly elevated pulmonary  artery systolic pressure.   3. Left atrial size was mildly dilated.   4. Right atrial size was mildly dilated.   5. The mitral valve is normal in structure. Mild mitral valve  regurgitation. No evidence of mitral stenosis.   6. Tricuspid valve regurgitation is moderate.   7. The aortic valve is tricuspid. There is mild calcification of the  aortic valve. Aortic valve regurgitation is mild. Aortic valve sclerosis  is present, with no evidence of aortic valve  stenosis.   8. The inferior vena cava is dilated in size with <50% respiratory  variability, suggesting right atrial pressure of 15 mmHg.   Lexiscan  2018 Narrative & Impression ? There was no ST segment deviation noted during stress. ? The study is normal. There are no perfusion defects consistent  with prior infarct or current ischemia. ? This is a low risk study. ? The left ventricular ejection fraction is normal (55-65%).   Physical Exam:   VS:  BP 120/76 (BP Location: Left Arm, Cuff Size: Normal)   Pulse 73   Ht 5' 11 (1.803 m)   Wt 158 lb (71.7 kg)   SpO2 97%   BMI 22.04 kg/m    Wt Readings from Last 3 Encounters:  09/02/23 158 lb (71.7 kg)  04/13/23 155 lb (70.3 kg)  02/23/23 156 lb (70.8 kg)    GEN: Well nourished, well developed in no acute distress while sitting in chair accompanied by daughter.  NECK: No JVD; No carotid bruits CARDIAC: RRR, no murmurs, rubs, gallops RESPIRATORY:  Clear to auscultation without rales, wheezing or rhonchi  ABDOMEN: Soft, non-tender, non-distended EXTREMITIES:  No edema; No deformity   ASSESSMENT AND PLAN: .   Atypical CP  Coronary calcification on CT HLD, LDL goal < 100 2018 Lexiscan : normal  2023 CT showed coronary calcification in the LAD distribution.   01/2023: AST/ALT WNL; LDL 37 Review previous EKG 01/2023: NSR, HR 70, chronic LBBB Reports chronic CP x years that has been changed, unable to describe it, last for 1 minute, 1/10, located in mid sternal, associated with exertion, and less severe than previous episodes. Continue ASA 81 mg daily, losartan  25 mg daily, Crestor  20 mg daily Discussed additional antianginal medication but would prefer to decline for now since CP is not as severe. Not able to take Imdur due to history of headaches. Can consider Ranexa in future if interested. Would avoid BB due to previous bradycardia response.   Chronic HFrEF 03/04/2021 echo: EF 30 to 35%. Per 03/17/2022 note, assumed ischemic etiology based on CT evidence of coronary calcification in LAD  in 2023. This morning had a episode of SOB after moving equipment in yard while someone was cutting the grass that was relieved by inhaler. No other recent episode of SOB. Reports chronic edema x years that is relieved with leg elevations. Has not  tried compression stockings and prefers to decline those.  No signs of volume overload.  01/2023: Cr 1.46, K 4.2. Order BMP.   Continue daily dose of Farxiga  10 mg, losartan  25 mg, spironolactone  12.5 mg, Lasix  20 mg Can not tolerate BB due to bradycardia.   ADDENDUM: 09/02/2023: Cr. 1.64.  Called and discussed the recent lab results with daughter. Electrolytes are normal. Kidney function (creatinine) remains elevated at 1.64. Recommended decreasing Lasix  20 mg to every other day and follow up BMP labs in 2 weeks (09/16/2023). Encouraged to continue leg elevation. Contact office if worsening leg swelling, worsening shortness of breath, or weight changes (if weight increases 2-3 lbs in one day or 5+ lbs in 1 week). Daughter is aware, understands and agrees.   AAA 03/2020 US  showed enlargement of the aorta and iliac artery 05/2023 US : stable dilation of the mid abdominal aorta with largest measurement 3.1 cm  Will continue to monitor  Vertigo  Reports chronic dizziness but relates to vertigo Follow with PCP   Neck pain Reports neck pain since March previously assessed by PCP who recommended tylenol  as needed for pain. PCP  felt most likely related to cervical arthritis.      Dispo: Follow up in 6 months unless symptoms worsen  Signed, Metta Actis, PA-C

## 2023-09-02 ENCOUNTER — Encounter: Payer: Self-pay | Admitting: Physician Assistant

## 2023-09-02 ENCOUNTER — Ambulatory Visit: Payer: Self-pay | Admitting: Physician Assistant

## 2023-09-02 ENCOUNTER — Ambulatory Visit: Attending: Student | Admitting: Physician Assistant

## 2023-09-02 ENCOUNTER — Other Ambulatory Visit (HOSPITAL_COMMUNITY)
Admission: RE | Admit: 2023-09-02 | Discharge: 2023-09-02 | Disposition: A | Discharge status: Home / Self Care | Source: Ambulatory Visit | Attending: Physician Assistant | Admitting: Physician Assistant

## 2023-09-02 VITALS — BP 120/76 | HR 73 | Ht 71.0 in | Wt 158.0 lb

## 2023-09-02 DIAGNOSIS — N179 Acute kidney failure, unspecified: Secondary | ICD-10-CM

## 2023-09-02 DIAGNOSIS — I7143 Infrarenal abdominal aortic aneurysm, without rupture: Secondary | ICD-10-CM

## 2023-09-02 DIAGNOSIS — I2510 Atherosclerotic heart disease of native coronary artery without angina pectoris: Secondary | ICD-10-CM

## 2023-09-02 DIAGNOSIS — R42 Dizziness and giddiness: Secondary | ICD-10-CM | POA: Diagnosis not present

## 2023-09-02 DIAGNOSIS — Z79899 Other long term (current) drug therapy: Secondary | ICD-10-CM

## 2023-09-02 DIAGNOSIS — I5020 Unspecified systolic (congestive) heart failure: Secondary | ICD-10-CM

## 2023-09-02 DIAGNOSIS — M542 Cervicalgia: Secondary | ICD-10-CM

## 2023-09-02 DIAGNOSIS — R0789 Other chest pain: Secondary | ICD-10-CM | POA: Diagnosis not present

## 2023-09-02 DIAGNOSIS — E782 Mixed hyperlipidemia: Secondary | ICD-10-CM | POA: Diagnosis not present

## 2023-09-02 LAB — BASIC METABOLIC PANEL WITH GFR
Anion gap: 10 (ref 5–15)
BUN: 18 mg/dL (ref 8–23)
CO2: 26 mmol/L (ref 22–32)
Calcium: 9.3 mg/dL (ref 8.9–10.3)
Chloride: 103 mmol/L (ref 98–111)
Creatinine, Ser: 1.64 mg/dL — ABNORMAL HIGH (ref 0.61–1.24)
GFR, Estimated: 38 mL/min — ABNORMAL LOW (ref >60)
Glucose, Bld: 94 mg/dL (ref 70–99)
Potassium: 3.7 mmol/L (ref 3.5–5.1)
Sodium: 139 mmol/L (ref 135–145)

## 2023-09-02 MED ORDER — FUROSEMIDE 20 MG PO TABS: 20.0000 mg | ORAL_TABLET | ORAL | 3 refills | Status: DC

## 2023-09-02 NOTE — Patient Instructions (Signed)
 Medication Instructions:  Your physician recommends that you continue on your current medications as directed. Please refer to the Current Medication list given to you today.  *If you need a refill on your cardiac medications before your next appointment, please call your pharmacy*  Lab Work: Your physician recommends that you return for lab work today. ( BMP)   If you have labs (blood work) drawn today and your tests are completely normal, you will receive your results only by: MyChart Message (if you have MyChart) OR A paper copy in the mail If you have any lab test that is abnormal or we need to change your treatment, we will call you to review the results.  Testing/Procedures: NONE   Follow-Up: At [facility], you and your health needs are our priority.  As part of our continuing mission to provide you with exceptional heart care, our providers are all part of one team.  This team includes your primary Cardiologist (physician) and Advanced Practice Providers or APPs (Physician Assistants and Nurse Practitioners) who all work together to provide you with the care you need, when you need it.  Your next appointment:   6 month(s)  Provider:   You may see Ola Berger, MD or one of the following Advanced Practice Providers on your designated Care Team:   Woodfin Hays, PA-C  [place], [place] Theotis Flake, [place]     We recommend signing up for the patient portal called MyChart.  Sign up information is provided on this After Visit Summary.  MyChart is used to connect with patients for Virtual Visits (Telemedicine).  Patients are able to view lab/test results, encounter notes, upcoming appointments, etc.  Non-urgent messages can be sent to your provider as well.   To learn more about what you can do with MyChart, go to [email or web address].   Other Instructions Thank you for choosing [facility]!

## 2023-09-03 ENCOUNTER — Ambulatory Visit: Payer: Medicare HMO | Admitting: Internal Medicine

## 2023-09-05 ENCOUNTER — Other Ambulatory Visit: Payer: Self-pay | Admitting: Family Medicine

## 2023-09-07 NOTE — Telephone Encounter (Signed)
 Nurses It is highly possible that the Farxiga  was moved from 5 mg to 10 mg but the pharmacy is sending us  the old prescription Unfortunately this increases the likelihood of medication error Therefore-it is necessary to call the pharmacy to find out what was the most recent prescription of Farxiga  that the patient received?  10 mg or 5 mg.  Also what do they have on file as his prescription medicine? Plus also call Mr. Helzer family and confirm the milligram of far disease that he is currently taking Once we have that information then we can move forward with this issue-thank you-Dr. Glendia

## 2023-09-08 ENCOUNTER — Ambulatory Visit: Admitting: Medical

## 2023-09-08 NOTE — Progress Notes (Deleted)
  Cardiology Office Note   Date:  09/08/2023  ID:  Fouad, Taul 1925-07-08, MRN [id number] PCP: Alphonsa Glendia LABOR, MD  [facility] Providers Cardiologist:  Vina Gull, MD { Click to update primary MD,subspecialty MD or APP then REFRESH:1}    History of Present Illness BARAK BIALECKI is a 89 y.o. male with a history of atypical chest pain with normal Lexiscan  in 2018, HFrEF, hypertension, hyperlipidemia, AAA who presents for 1 week follow-up.  Patient was seen 09/02/2023 reporting stable chest pain.  He appears euvolemic on exam.  Follow-up labs showed creatinine with a mild bump up to 1.64 Lasix  was decreased.  ROS: ***  Studies Reviewed      *** Risk Assessment/Calculations {Does this patient have ATRIAL FIBRILLATION?:[phone]} No BP recorded.  {Refresh Note OR Click here to enter BP  :1}***       Physical Exam VS:  There were no vitals taken for this visit.       Wt Readings from Last 3 Encounters:  09/02/23 158 lb (71.7 kg)  04/13/23 155 lb (70.3 kg)  02/23/23 156 lb (70.8 kg)    GEN: Well nourished, well developed in no acute distress NECK: No JVD; No carotid bruits CARDIAC: ***RRR, no murmurs, rubs, gallops RESPIRATORY:  Clear to auscultation without rales, wheezing or rhonchi  ABDOMEN: Soft, non-tender, non-distended EXTREMITIES:  No edema; No deformity   ASSESSMENT AND PLAN ***    {Are you ordering a CV Procedure (e.g. stress test, cath, DCCV, TEE, etc)?   Press F2        :[id number]}  Dispo: ***  Signed, Demitra Danley VEAR Fishman, PA-C

## 2023-09-09 ENCOUNTER — Encounter: Payer: Self-pay | Admitting: Podiatry

## 2023-09-09 ENCOUNTER — Ambulatory Visit: Admitting: Podiatry

## 2023-09-09 DIAGNOSIS — M79675 Pain in left toe(s): Secondary | ICD-10-CM

## 2023-09-09 DIAGNOSIS — S99921A Unspecified injury of right foot, initial encounter: Secondary | ICD-10-CM | POA: Diagnosis not present

## 2023-09-09 DIAGNOSIS — M79674 Pain in right toe(s): Secondary | ICD-10-CM

## 2023-09-09 DIAGNOSIS — B351 Tinea unguium: Secondary | ICD-10-CM | POA: Diagnosis not present

## 2023-09-09 NOTE — Progress Notes (Signed)
  Subjective:  Patient ID: [name], male    DOB: September 29, 1925,   MRN: [id number]  Chief Complaint  Patient presents with   Nail Problem    My toenails need to be cut.  I pulled my sock off and it pulled my toenail off on my right big toe.    89 y.o. male presents for concern of right great toe injury that occurred several days ok. He relates he pulled his sock off and the nail was lifted off. Does not recall injury. Does relate trying to trim it back and may have caused some bleeding after this. Also requestin to have nail trimmed today  . Denies any other pedal complaints. Denies n/v/f/c.   Past Medical History:  Diagnosis Date   Asthma    Diverticulosis    GERD (gastroesophageal reflux disease)    Gout    Heart murmur    Evaluation by a cardiologist years ago was reportedly negative   HTN (hypertension)    Osteoarthritis    Reflux     Objective:  Physical Exam: Vascular: DP/PT pulses 2/4 bilateral. CFT <3 seconds. Normal hair growth on digits. No edema.  Skin. No lacerations or abrasions bilateral feet. Right hallux nail thickened and dystrophic with bleeding surroudnign distal nail plate. Slighly lifted and small abrasion noted to undelrying nail bed. Remaining nails thickened dystrophic and elongated.  Musculoskeletal: MMT 5/5 bilateral lower extremities in DF, PF, Inversion and Eversion. Deceased ROM in DF of ankle joint.  Neurological: Sensation intact to light touch.   Assessment:   1. Injury of toe on right foot, initial encounter   2. Pain due to onychomycosis of toenails of both feet      Plan:  Patient was evaluated and treated and all questions answered. -Discussed and educated patient onoot care, especially with  regards to the vascular, neurological and musculoskeletal systems.  -Right hallux nail was debrided and underlying nail bed with small abrasion. Area cleaned and covered with neosporin and bandaid.  -Discussed supportive shoes at all times  and checking feet regularly.  -Mechanically debrided all nails 1-5 bilateral using sterile nail nipper and filed with dremel without incident as courtesy.  -Answered all patient questions -Patient to return  in 3 months for at risk foot care -Patient advised to call the office if any problems or questions arise in the meantime.   Asberry Failing, DPM

## 2023-09-09 NOTE — Telephone Encounter (Signed)
 Spoke with [facility] pharmacy, last pick up of the Farxiga  was of 5 mg on 08/16/23 and are requesting a refill. The last refill of the farxiga  10 mg was in 2024 please advise

## 2023-09-10 NOTE — Telephone Encounter (Signed)
 Please verify with his daughter-[name]-what dose of the Farxiga  is he taking at home It appears that he should be on the 10 mg but the pharmacy is requesting the 5 mg Will not provide refill until this is clarified thank you

## 2023-09-13 NOTE — Telephone Encounter (Signed)
 I am in the middle here I think   Not sure why he is on 5 mg     Agree with S Luking-- need to clarify with family what he is taking    If 5 mg   why?

## 2023-09-14 ENCOUNTER — Ambulatory Visit: Admitting: Podiatry

## 2023-09-16 ENCOUNTER — Ambulatory Visit: Payer: Self-pay | Admitting: Physician Assistant

## 2023-09-16 ENCOUNTER — Other Ambulatory Visit (HOSPITAL_COMMUNITY)
Admission: RE | Admit: 2023-09-16 | Discharge: 2023-09-16 | Disposition: A | Discharge status: Home / Self Care | Source: Ambulatory Visit | Attending: Physician Assistant | Admitting: Physician Assistant

## 2023-09-16 DIAGNOSIS — Z79899 Other long term (current) drug therapy: Secondary | ICD-10-CM | POA: Diagnosis not present

## 2023-09-16 DIAGNOSIS — N179 Acute kidney failure, unspecified: Secondary | ICD-10-CM | POA: Diagnosis not present

## 2023-09-16 LAB — BASIC METABOLIC PANEL WITH GFR
Anion gap: 9 (ref 5–15)
BUN: 16 mg/dL (ref 8–23)
CO2: 26 mmol/L (ref 22–32)
Calcium: 9.1 mg/dL (ref 8.9–10.3)
Chloride: 104 mmol/L (ref 98–111)
Creatinine, Ser: 1.27 mg/dL — ABNORMAL HIGH (ref 0.61–1.24)
GFR, Estimated: 52 mL/min — ABNORMAL LOW (ref >60)
Glucose, Bld: 106 mg/dL — ABNORMAL HIGH (ref 70–99)
Potassium: 3.9 mmol/L (ref 3.5–5.1)
Sodium: 139 mmol/L (ref 135–145)

## 2023-09-24 ENCOUNTER — Telehealth: Payer: Self-pay | Admitting: Family Medicine

## 2023-09-24 NOTE — Telephone Encounter (Signed)
 Spoke with Penne at [facility]. Pt was prescribed Farxiga  5 mg in Sept., and then prescribed Farxiga  10 mg in Dec. Of 2024. [facility] was filling the old prescription. [facility] will fill the Farxiga  for the pt.

## 2023-09-24 NOTE — Telephone Encounter (Unsigned)
 Copied from CRM [id number]. Topic: Clinical - Medication Refill >> Sep 24, 2023  2:33 PM Marissa P wrote: Medication: FARXIGA  10 MG TABS tablet  Has the patient contacted their pharmacy? Yes (Agent: If no, request that the patient contact the pharmacy for the refill. If patient does not wish to contact the pharmacy document the reason why and proceed with request.) (Agent: If yes, when and what did the pharmacy advise?)  This is the patient's preferred pharmacy:  [facility] [place], [place] - [id number] [place] [address] Phone: [phone] Fax: [phone]  Is this the correct pharmacy for this prescription? Yes If no, delete pharmacy and type the correct one.   Has the prescription been filled recently? Yes  Is the patient out of the medication? Yes  Has the patient been seen for an appointment in the last year OR does the patient have an upcoming appointment? Yes  Can we respond through MyChart? No  Agent: Please be advised that Rx refills may take up to 3 business days. We ask that you follow-up with your pharmacy.

## 2023-09-26 NOTE — Telephone Encounter (Signed)
 Lets stick with 5 mg 1/day for now please have patient do a metabolic 7 in 4 weeks.Farxiga  5 mg 1/day, #30, 5 refills (We can always increase it to 10 mg down the road I just get the feel that there is a strong possibility of medication error either from the pharmacy or from how the patient takes his medicines so I would rather be safe and that stick with 5 mg for now)  Please send in the above refills, ordered the metabolic 7, notify the patient to do the labs thank you

## 2023-09-27 ENCOUNTER — Other Ambulatory Visit: Payer: Self-pay

## 2023-09-27 DIAGNOSIS — N1832 Chronic kidney disease, stage 3b: Secondary | ICD-10-CM

## 2023-09-27 MED ORDER — DAPAGLIFLOZIN PROPANEDIOL 5 MG PO TABS: 5.0000 mg | ORAL_TABLET | Freq: Every day | ORAL | 5 refills | Status: DC

## 2023-09-27 NOTE — Telephone Encounter (Signed)
 Left a message for daughter Elyn to return call .

## 2023-09-27 NOTE — Telephone Encounter (Signed)
 Spoke with Ms [name] and patient they clarify that the bottle at home says 5 mg farxiga  and that's what he's been taking, informed blood work in 4 weeks.

## 2023-10-05 ENCOUNTER — Other Ambulatory Visit: Payer: Self-pay | Admitting: Family Medicine

## 2023-10-11 ENCOUNTER — Telehealth: Payer: Self-pay | Admitting: Physician Assistant

## 2023-10-11 DIAGNOSIS — R0602 Shortness of breath: Secondary | ICD-10-CM

## 2023-10-11 DIAGNOSIS — Z79899 Other long term (current) drug therapy: Secondary | ICD-10-CM

## 2023-10-11 NOTE — Telephone Encounter (Signed)
 I spoke with daughter and they plan on getting cxr and labs done at [facility] tomorrow.  I have messaged front office top schedule next available appointment and to also place on wait list.    Pending labs for lasix  dosing.

## 2023-10-11 NOTE — Telephone Encounter (Signed)
 I spoke with daughter who had called us ,AVA. She said she has not seen her father but in talking to him on the phone and he is complaining of SOB and CP. She is unsure how long this has been going on.She tells me he does not weigh himself and she believes he is taking all his medications.   I called patient  [phone] and he tells me he has had SOB and occasional CP for the past 7-10 days, notes especially when he is up walking around . He reports coughing up coal .When I asked him to describe the sputum ,he says it is green and yellow. He says he cannot see his scale if he weighs himself.He says he takes all of his medication as directed     I will forward to provider for review.

## 2023-10-11 NOTE — Telephone Encounter (Signed)
 Pt c/o Shortness Of Breath: STAT if SOB developed within the last 24 hours or pt is noticeably SOB on the phone  1. Are you currently SOB (can you hear that pt is SOB on the phone)? No   2. How long have you been experiencing SOB? Since last appt   3. Are you SOB when sitting or when up moving around? Moving around   4. Are you currently experiencing any other symptoms? No

## 2023-10-12 ENCOUNTER — Other Ambulatory Visit (HOSPITAL_COMMUNITY)
Admission: RE | Admit: 2023-10-12 | Discharge: 2023-10-12 | Disposition: A | Discharge status: Home / Self Care | Source: Ambulatory Visit | Attending: Physician Assistant | Admitting: Physician Assistant

## 2023-10-12 ENCOUNTER — Ambulatory Visit (HOSPITAL_COMMUNITY)
Admission: RE | Admit: 2023-10-12 | Discharge: 2023-10-12 | Disposition: A | Discharge status: Home / Self Care | Source: Ambulatory Visit | Attending: Physician Assistant | Admitting: Physician Assistant

## 2023-10-12 DIAGNOSIS — R0602 Shortness of breath: Secondary | ICD-10-CM | POA: Diagnosis not present

## 2023-10-12 DIAGNOSIS — Z79899 Other long term (current) drug therapy: Secondary | ICD-10-CM | POA: Insufficient documentation

## 2023-10-12 DIAGNOSIS — R918 Other nonspecific abnormal finding of lung field: Secondary | ICD-10-CM | POA: Diagnosis not present

## 2023-10-12 LAB — BASIC METABOLIC PANEL WITH GFR
Anion gap: 10 (ref 5–15)
BUN: 16 mg/dL (ref 8–23)
CO2: 26 mmol/L (ref 22–32)
Calcium: 9.3 mg/dL (ref 8.9–10.3)
Chloride: 104 mmol/L (ref 98–111)
Creatinine, Ser: 1.24 mg/dL (ref 0.61–1.24)
GFR, Estimated: 54 mL/min — ABNORMAL LOW (ref >60)
Glucose, Bld: 113 mg/dL — ABNORMAL HIGH (ref 70–99)
Potassium: 4.5 mmol/L (ref 3.5–5.1)
Sodium: 140 mmol/L (ref 135–145)

## 2023-10-12 LAB — CBC
HCT: 37 % — ABNORMAL LOW (ref 39.0–52.0)
Hemoglobin: 11.8 g/dL — ABNORMAL LOW (ref 13.0–17.0)
MCH: 32.6 pg (ref 26.0–34.0)
MCHC: 31.9 g/dL (ref 30.0–36.0)
MCV: 102.2 fL — ABNORMAL HIGH (ref 80.0–100.0)
Platelets: 161 K/uL (ref 150–400)
RBC: 3.62 MIL/uL — ABNORMAL LOW (ref 4.22–5.81)
RDW: 11.9 % (ref 11.5–15.5)
WBC: 5.3 K/uL (ref 4.0–10.5)
nRBC: 0 % (ref 0.0–0.2)

## 2023-10-12 LAB — BRAIN NATRIURETIC PEPTIDE: B Natriuretic Peptide: 299 pg/mL — ABNORMAL HIGH (ref 0.0–100.0)

## 2023-10-14 ENCOUNTER — Other Ambulatory Visit: Payer: Self-pay | Admitting: Family Medicine

## 2023-10-14 ENCOUNTER — Ambulatory Visit: Payer: Self-pay | Admitting: Physician Assistant

## 2023-10-14 DIAGNOSIS — Z79899 Other long term (current) drug therapy: Secondary | ICD-10-CM

## 2023-10-15 MED ORDER — FUROSEMIDE 20 MG PO TABS: ORAL_TABLET | ORAL | 1 refills | Status: DC

## 2023-10-15 NOTE — Telephone Encounter (Signed)
-----   Message from Lorette CINDERELLA Kapur sent at 10/14/2023  3:08 PM EDT ----- Called to discuss results, no answer. Please follow up: Electrolytes and Kidney function (Creatinine) are normal. Blood count is stable and very similar to previous results. Fluid overload enzyme has  reduced significantly but is still elevated at 299 (normal < 100). Although the CXR has not officially resulted yet, based on my own visual comparison to previous chest x ray, there are signs  suggestive of some fluid in the lungs. Patient weight in office on 10/12/2023 was 155 lbs (158 lbs in last office visit on 09/02/2023). Recommend alternating Lasix  dosage with 20 mg one day then 10 mg  the next day then repeat. If patient and/or daughter agrees, then order follow up CMP in 2 weeks. Encourage daily hydration.  ----- Message ----- From: Rebecka, Lab In [place] Sent: 10/12/2023   1:44 PM EDT To: Lorette CINDERELLA Kapur, PA-C

## 2023-10-15 NOTE — Telephone Encounter (Signed)
 I spoke with daughter,Ava, and discussed lab,cxr results. She said her family member is a Engineer, civil (consulting) and will read the MyChart message.   Pamala will deliver the lasix  and orders placed for labs at [facility]

## 2023-10-19 ENCOUNTER — Encounter: Payer: Self-pay | Admitting: *Deleted

## 2023-10-19 ENCOUNTER — Telehealth: Payer: Self-pay | Admitting: Internal Medicine

## 2023-10-19 DIAGNOSIS — R0602 Shortness of breath: Secondary | ICD-10-CM

## 2023-10-19 NOTE — Telephone Encounter (Signed)
 [facility] calling to give call report on pt. Call transfer.

## 2023-10-19 NOTE — Telephone Encounter (Signed)
 Spoke with diane at GI, wanted to make sure we reviewed the cxr and what the recommendations are. Will forward to ordering provider.

## 2023-10-19 NOTE — Telephone Encounter (Signed)
 Spoke with pt daughter, aware of results and recommendations. Order placed for repeat cxr. Information placed in my chart at daughters request.

## 2023-10-25 ENCOUNTER — Other Ambulatory Visit: Payer: Self-pay | Admitting: Family Medicine

## 2023-10-25 NOTE — Telephone Encounter (Signed)
 Copied from CRM [id number]. Topic: Clinical - Medication Refill >> Oct 25, 2023  4:37 PM Leah C wrote: Medication: losartan  (COZAAR ) 25 MG tablet  Has the patient contacted their pharmacy? Yes. And they said they needed authorization and to contact the clinic. Patient only has one pill left.   This is the patient's preferred pharmacy:  [facility] [place], [place] - [id number] [place] [address] Phone: [phone] Fax: [phone]  Is this the correct pharmacy for this prescription? Yes If no, delete pharmacy and type the correct one.   Has the prescription been filled recently? No  Is the patient out of the medication? Patient has one pill left.   Has the patient been seen for an appointment in the last year OR does the patient have an upcoming appointment? Yes  Can we respond through MyChart? No  Agent: Please be advised that Rx refills may take up to 3 business days. We ask that you follow-up with your pharmacy.

## 2023-10-28 ENCOUNTER — Other Ambulatory Visit (HOSPITAL_COMMUNITY)
Admission: RE | Admit: 2023-10-28 | Discharge: 2023-10-28 | Disposition: A | Discharge status: Home / Self Care | Source: Ambulatory Visit | Attending: Physician Assistant | Admitting: Physician Assistant

## 2023-10-28 DIAGNOSIS — Z79899 Other long term (current) drug therapy: Secondary | ICD-10-CM | POA: Insufficient documentation

## 2023-10-28 LAB — COMPREHENSIVE METABOLIC PANEL WITH GFR
ALT: 12 U/L (ref 0–44)
AST: 22 U/L (ref 15–41)
Albumin: 3.7 g/dL (ref 3.5–5.0)
Alkaline Phosphatase: 69 U/L (ref 38–126)
Anion gap: 10 (ref 5–15)
BUN: 17 mg/dL (ref 8–23)
CO2: 24 mmol/L (ref 22–32)
Calcium: 9.2 mg/dL (ref 8.9–10.3)
Chloride: 104 mmol/L (ref 98–111)
Creatinine, Ser: 1.32 mg/dL — ABNORMAL HIGH (ref 0.61–1.24)
GFR, Estimated: 50 mL/min — ABNORMAL LOW (ref >60)
Glucose, Bld: 95 mg/dL (ref 70–99)
Potassium: 3.7 mmol/L (ref 3.5–5.1)
Sodium: 138 mmol/L (ref 135–145)
Total Bilirubin: 2.1 mg/dL — ABNORMAL HIGH (ref 0.0–1.2)
Total Protein: 7.4 g/dL (ref 6.5–8.1)

## 2023-11-01 ENCOUNTER — Ambulatory Visit: Payer: Self-pay | Admitting: Physician Assistant

## 2023-11-01 ENCOUNTER — Encounter: Payer: Self-pay | Admitting: Internal Medicine

## 2023-11-02 ENCOUNTER — Telehealth: Payer: Self-pay | Admitting: Internal Medicine

## 2023-11-02 NOTE — Telephone Encounter (Signed)
 Troy CINDERELLA Kapur, PA-C 11/01/2023  3:46 PM EDT Back to Top    Called and discussed results with patient and daughter. Electrolytes are normal. Creatinine (Kidney function) is slightly elevated above normal from 1.24 to 1.32. Patient reports improvement in SOB but is still present with minimal activity and does not see any obvious signs of volume overload. He does not weight himself daily. Discussed the importance of daily weights and strongly encouraged daily weight log. Advised to contact office if more than 2-3 lbs gain in 1 day or more than 5 lbs in 1 week and any worsening SOB. Recommended reducing Lasix  back to 20 mg every other day. Encourage daily hydration but no more than 60 oz daily. Patient/Daughter understands and agrees. Will discuss repeat lab test and CXR at next follow up appointment.       I left message for daughter that we do not need further labs at this time ans that we would re-evaluate at follow up apt on 11/18/23

## 2023-11-02 NOTE — Telephone Encounter (Signed)
 Patient's daughter called wanting to know if patient needs to repeat lab work anytime soon. Please advise.

## 2023-11-17 NOTE — Progress Notes (Unsigned)
 Cardiology Office Note:  .   Date:  11/18/2023  ID:  [name], DOB 02/11/1926, MRN [id number] PCP: Alphonsa Glendia LABOR, MD  [facility] Providers Cardiologist:  Vina Gull, MD   History of Present Illness: .   [name] is a 89 y.o. male  with PMHx of atypical chest pain with Normal Lexiscan  2018, HFrEF (02/2022: 30-35%), HTN, HLD, AAA,  who reports to [facility] office for follow up.   Last seen in heartcare OV 09/02/2023 by myself for follow-up.  Reported 1 episode of SOB, chronic edema and chronic chest pain.  Also noted neck pain that was assessed by PCP and chronic dizziness related to vertigo.  Under a lot of stress since wife of 70 years died about 1 to 2 months ago.  Discussed antianginal medication but patient prefers to decline since CP is not as severe.  Continue on ASA 81 mg daily, losartan  25 mg daily, Crestor  25 mg daily, Farxiga  10 mg, spironolactone  12.5 mg, Lasix  20 mg.  Follow-up BMP 6/19 showed elevated CR 1.64.  Recommended decreasing Lasix  to 20 mg every other day.  Follow-up BMP 7/3 showed CR still mildly elevated but closer to baseline at 1.27.  Recommended continuing Lasix  20 mg every other day.  Patient daughter called office with concerns of SOB on 10/11/2023.  Spoke with patient who noted SOB was unchanged, however did report intermittent yellow/green sputum X 2 to 3 months and wheezing that is relieved with nebulizer.  Reviewed chart with last CXR in 02/2023 showing improvement in bilateral pleural effusion with only trace residual effusion and decreased consolidation at left lung base.  Ordered CXR 10/12/2023 which showed left greater than right pulmonary opacities which likely reflect a combination of pleural fluid versus opacity.  Ordered follow-up labs including BMP (Cr WNL at 1.24), stable CBC, and BNP 299. Patient weight in office on 10/12/2023 was 155 lbs (158 lbs in last office visit on 09/02/2023).  Recommended alternating Lasix  dosage between 20 mg  and 10 mg daily.  Follow-up labs 10/28/2023 showed slight increase in Cr to 1.32.  Patient reported improvement in SOB but still present with minimal activity without any obvious signs of volume overload.  Recommended reducing Lasix  back to 20 mg every other day.  Today, accompanied by daughter.  Reports palpitations that feel like heart is jumping out of chest when under stress, however not able to describe frequency or duration.  Reports ongoing chronic fatigue, stable unchanged SOB with minimal exertion relieved with rest and inhaler, chronic unchanged dizziness and yellow/green sputum improved but still present.  Weight has been stable at home approximately 154 pounds. Notes improvement in neck pain.  Denies any chest pain, LE edema, syncope. Reports medication compliance with help from daughter who is a Engineer, civil (consulting). He eats what ever he wants. He is able to ambulate with a cane.  His granddaughter lives with him but he is able to do ADLs without assistance. Denies tobacco use/alcohol/drug use. Denies any recent hospitalizations or visits to the emergency department.   ROS: 10 point review of system has been reviewed and considered negative except ones been listed in the HPI.   Studies Reviewed: SABRA   AAA Duplex Jun 07, 2023 Summary:  Abdominal Aorta: There is evidence of abnormal dilatation of the mid  Abdominal aorta. The largest aortic measurement is 3.1 cm. The largest  aortic diameter remains essentially unchanged compared to prior exam.  Previous diameter measurement was 3.0 cm  obtained on 03/20/2020.   Stenosis:  Widely patent bilateral common and external iliac arteries without  evidence of focal stenosis.   IVC/Iliac: Patent IVC.    ECHO IMPRESSIONS 02/2022  1. Left ventricular ejection fraction, by estimation, is 30 to 35%. The  left ventricle has moderately decreased function. The left ventricle  demonstrates global hypokinesis. The left ventricular internal cavity size  was moderately  dilated. Left  ventricular diastolic parameters were normal.   2. Right ventricular systolic function is mildly reduced. The right  ventricular size is mildly enlarged. There is mildly elevated pulmonary  artery systolic pressure.   3. Left atrial size was mildly dilated.   4. Right atrial size was mildly dilated.   5. The mitral valve is normal in structure. Mild mitral valve  regurgitation. No evidence of mitral stenosis.   6. Tricuspid valve regurgitation is moderate.   7. The aortic valve is tricuspid. There is mild calcification of the  aortic valve. Aortic valve regurgitation is mild. Aortic valve sclerosis  is present, with no evidence of aortic valve stenosis.   8. The inferior vena cava is dilated in size with <50% respiratory  variability, suggesting right atrial pressure of 15 mmHg.    Lexiscan  2018 Narrative & Impression ?There was no ST segment deviation noted during stress. ?The study is normal. There are no perfusion defects consistent with prior infarct or current ischemia. ?This is a low risk study. ?The left ventricular ejection fraction is normal (55-65%).  Physical Exam:   VS:  BP 118/82 (BP Location: Right Arm, Patient Position: Sitting, Cuff Size: Normal)   Pulse 81   Ht 5' 9 (1.753 m)   Wt 155 lb (70.3 kg)   SpO2 94%   BMI 22.89 kg/m    Wt Readings from Last 3 Encounters:  11/18/23 155 lb (70.3 kg)  09/02/23 158 lb (71.7 kg)  04/13/23 155 lb (70.3 kg)    GEN: Well nourished, well developed in no acute distress while sitting in chair.  NECK: No JVD; No carotid bruits CARDIAC: RRR, no murmurs, rubs, gallops RESPIRATORY:  diminished breath sounds more on L than right; no crackles or wheezing.  ABDOMEN: Soft, non-tender, non-distended EXTREMITIES:  No edema; No deformity   ASSESSMENT AND PLAN: .   Chronic HFrEF Pleural Effusion Fatigue 03/04/2021 echo: EF 30 to 35%. Per 03/17/2022 note, assumed ischemic etiology based on CT evidence of coronary  calcification in LAD  in 2023. CXR in 02/2023 showing improvement in bilateral pleural effusion with only trace residual effusion and decreased consolidation at left lung base Phone call on 7/28 noted SOB was unchanged, however did report intermittent yellow/green sputum X 2 to 3 months and wheezing that is relieved with nebulizer. CXR 10/12/2023 which showed left greater than right pulmonary opacities which likely reflect a combination of pleural fluid versus opacity. CMP 10/28/2023 Cr to 1.32 & K 3.7 Reports chronic unchanged fatigue and SOB.  Notes yellow/green sputum improved but still present. Weight has been stable at home approximately 154 pounds.  Diminished breath sounds more on L than right but no crackles. No LE edema, JVD, or weight gain.  Continue daily dose of Farxiga  10 mg, losartan  25 mg, spironolactone  12.5 mg, Lasix  alternating between 20 mg and 10 mg every day.  Can not tolerate BB due to bradycardia. Order ECHO for 01/2024 prior to next OV.   Palpitations  Reports palpitations that feel like heart is jumping out of chest when under stress or thinking about deceased wife, however not able to describe frequency or  duration.   Discussed ZIO monitor x2 week.  Initially patient agreed to proceed with ZIO monitor then changed his mind and prefers to decline at this time.  Patient will contact office if he decides to proceed with monitor.  Discussed if palpitations worsen in frequency or duration to contact office.  Atypical CP  Coronary calcification on CT HLD, LDL goal < 100 2018 Lexiscan : normal  2023 CT showed coronary calcification in the LAD distribution.   01/2023: AST/ALT WNL; LDL 37 Review previous EKG 01/2023: NSR, HR 70, chronic LBBB Denies any anginal symptoms.  No need for further ischemic testing at this time.  Continue ASA 81 mg daily, losartan  25 mg daily, Crestor  20 mg daily If CP reoccurs can consider additional antianginal medication. Can consider Ranexa in  future if interested. Avoid Imdur due to history of headaches. Would avoid BB due to previous bradycardia response.    AAA 03/2020 US  showed enlargement of the aorta and iliac artery 05/2023 US : stable dilation of the mid abdominal aorta with largest measurement 3.1 cm  Will continue to monitor   Vertigo  Reports chronic dizziness but relates to vertigo. Follow with PCP    Neck pain Reported neck pain since March previously assessed by PCP who recommended tylenol  as needed for pain. PCP felt most likely related to cervical arthritis.   Notes improvement in neck pain but still present.    Dispo: Order ECHO in 01/2024 prior to next OV. F/U in 01/2024 with Dr. Okey as as scheduled. Follow up with Scottie in 6 months.   Signed, Lorette CINDERELLA Kapur, PA-C

## 2023-11-18 ENCOUNTER — Encounter: Payer: Self-pay | Admitting: Physician Assistant

## 2023-11-18 ENCOUNTER — Ambulatory Visit

## 2023-11-18 ENCOUNTER — Ambulatory Visit: Attending: Student | Admitting: Physician Assistant

## 2023-11-18 ENCOUNTER — Other Ambulatory Visit: Payer: Self-pay | Admitting: Family Medicine

## 2023-11-18 VITALS — BP 118/82 | HR 81 | Ht 69.0 in | Wt 155.0 lb

## 2023-11-18 DIAGNOSIS — R002 Palpitations: Secondary | ICD-10-CM

## 2023-11-18 DIAGNOSIS — E785 Hyperlipidemia, unspecified: Secondary | ICD-10-CM | POA: Diagnosis not present

## 2023-11-18 DIAGNOSIS — R42 Dizziness and giddiness: Secondary | ICD-10-CM | POA: Diagnosis not present

## 2023-11-18 DIAGNOSIS — R0789 Other chest pain: Secondary | ICD-10-CM

## 2023-11-18 DIAGNOSIS — I7143 Infrarenal abdominal aortic aneurysm, without rupture: Secondary | ICD-10-CM

## 2023-11-18 DIAGNOSIS — J90 Pleural effusion, not elsewhere classified: Secondary | ICD-10-CM | POA: Diagnosis not present

## 2023-11-18 DIAGNOSIS — R5383 Other fatigue: Secondary | ICD-10-CM

## 2023-11-18 DIAGNOSIS — I5020 Unspecified systolic (congestive) heart failure: Secondary | ICD-10-CM | POA: Diagnosis not present

## 2023-11-18 DIAGNOSIS — I2510 Atherosclerotic heart disease of native coronary artery without angina pectoris: Secondary | ICD-10-CM | POA: Diagnosis not present

## 2023-11-18 DIAGNOSIS — M542 Cervicalgia: Secondary | ICD-10-CM

## 2023-11-18 NOTE — Patient Instructions (Signed)
 Medication Instructions:  Your physician recommends that you continue on your current medications as directed. Please refer to the Current Medication list given to you today.  *If you need a refill on your cardiac medications before your next appointment, please call your pharmacy*  Lab Work: NONE   If you have labs (blood work) drawn today and your tests are completely normal, you will receive your results only by: MyChart Message (if you have MyChart) OR A paper copy in the mail If you have any lab test that is abnormal or we need to change your treatment, we will call you to review the results.  Testing/Procedures: Your physician has requested that you have an echocardiogram. Echocardiography is a painless test that uses sound waves to create images of your heart. It provides your doctor with information about the size and shape of your heart and how well your heart's chambers and valves are working. This procedure takes approximately one hour. There are no restrictions for this procedure. Please do NOT wear cologne, perfume, aftershave, or lotions (deodorant is allowed). Please arrive 15 minutes prior to your appointment time.  Please note: We ask at that you not bring children with you during ultrasound (echo/ vascular) testing. Due to room size and safety concerns, children are not allowed in the ultrasound rooms during exams. Our front office staff cannot provide observation of children in our lobby area while testing is being conducted. An adult accompanying a patient to their appointment will only be allowed in the ultrasound room at the discretion of the ultrasound technician under special circumstances. We apologize for any inconvenience.   Follow-Up: At [facility], you and your health needs are our priority.  As part of our continuing mission to provide you with exceptional heart care, our providers are all part of one team.  This team includes your primary Cardiologist  (physician) and Advanced Practice Providers or APPs (Physician Assistants and Nurse Practitioners) who all work together to provide you with the care you need, when you need it.  Your next appointment:   6 month(s)  Provider:   Lorette Kapur, PA-C     We recommend signing up for the patient portal called MyChart.  Sign up information is provided on this After Visit Summary.  MyChart is used to connect with patients for Virtual Visits (Telemedicine).  Patients are able to view lab/test results, encounter notes, upcoming appointments, etc.  Non-urgent messages can be sent to your provider as well.   To learn more about what you can do with MyChart, go to [email or web address].   Other Instructions Thank you for choosing [facility]!

## 2023-11-24 ENCOUNTER — Encounter: Payer: Self-pay | Admitting: Family Medicine

## 2023-11-24 ENCOUNTER — Ambulatory Visit: Admitting: Family Medicine

## 2023-11-24 ENCOUNTER — Telehealth: Payer: Self-pay | Admitting: Family Medicine

## 2023-11-24 VITALS — BP 131/73 | HR 77 | Temp 98.2°F | Ht 69.0 in | Wt 152.0 lb

## 2023-11-24 DIAGNOSIS — R0609 Other forms of dyspnea: Secondary | ICD-10-CM

## 2023-11-24 DIAGNOSIS — J90 Pleural effusion, not elsewhere classified: Secondary | ICD-10-CM

## 2023-11-24 DIAGNOSIS — Z23 Encounter for immunization: Secondary | ICD-10-CM

## 2023-11-24 DIAGNOSIS — R042 Hemoptysis: Secondary | ICD-10-CM

## 2023-11-24 DIAGNOSIS — N1832 Chronic kidney disease, stage 3b: Secondary | ICD-10-CM

## 2023-11-24 NOTE — Telephone Encounter (Signed)
 The patient needs CT scan of the chest without contrast due to hemoptysis Please order stat so we get the result of relatively soon but the test can be done anywhere in the next 14 days thank you

## 2023-11-24 NOTE — Progress Notes (Signed)
   Subjective:    Patient ID: [name], male    DOB: Oct 01, 1925, 89 y.o.   MRN: [id number]  HPI follow up  Cervical radiculopathy patient has intermittent pain discomfort radiates into the arms but no weakness AAA-he has minimal dilation of the aorta is being followed periodically patient not a good surgical candidate unlikely that it will rupture CKD-patient has chronic CKD but overall it is stable not having any major setbacks with this Going through a lot of grief after the loss of his long term wife Dealing with some sadness but denies being depressed Has a history of pleural effusion also history of congestive heart failure Also relates intermittent hemoptysis recently although it is difficult to tell if this is postnasal drip or from his lungs denies weight loss of any significant degree states his appetite doing well  Prior to his regular  Review of Systems     Objective:   Physical Exam General-in no acute distress Eyes-no discharge Lungs-respiratory rate normal, CTA CV-no murmurs,RRR Extremities skin warm dry no edema Neuro grossly normal Behavior normal, alert Breath sounds in the lungs more so in the left base       Assessment & Plan:  1. Immunization due (Primary) Today - Flu vaccine HIGH DOSE PF(Fluzone Trivalent)  2. Hemoptysis Intermittently he states he is coughing up blood it is hard to know if this is postnasal drainage with blood or if it is true hemoptysis patient does the best he can at the end of historian  3. DOE (dyspnea on exertion) I believe this is multifactorial heart failure, pleural effusion, chronic lung, deconditioning with age current oxygen  saturation good  4. Stage 3b chronic kidney disease (HCC) Stable repeat labs toward the end of November  5. Pleural effusion Situation had enough to do thoracentesis the risk of complications outweigh the benefit currently  Patient suffering with grief from the loss of his wife but is  trying to tolerate things as best as possible Follow-up in approximately 4 to 5 months

## 2023-11-25 ENCOUNTER — Other Ambulatory Visit: Payer: Self-pay

## 2023-11-25 DIAGNOSIS — R042 Hemoptysis: Secondary | ICD-10-CM

## 2023-11-25 NOTE — Telephone Encounter (Signed)
 Orders placed for a CT scan as indicted

## 2023-11-29 ENCOUNTER — Other Ambulatory Visit: Payer: Self-pay | Admitting: Family Medicine

## 2023-11-29 NOTE — Telephone Encounter (Signed)
 Copied from CRM #[id number]. Topic: Clinical - Medication Refill >> Nov 29, 2023 11:04 AM Laymon HERO wrote: Medication: albuterol  (VENTOLIN  HFA) 108 (90 Base) MCG/ACT inhaler  Has the patient contacted their pharmacy? Yes (Agent: If no, request that the patient contact the pharmacy for the refill. If patient does not wish to contact the pharmacy document the reason why and proceed with request.) (Agent: If yes, when and what did the pharmacy advise?)  This is the patient's preferred pharmacy:  [facility] [place], [place] - [id number] [place] [address] Phone: [phone] Fax: [phone]  Is this the correct pharmacy for this prescription? Yes If no, delete pharmacy and type the correct one.   Has the prescription been filled recently? Yes  Is the patient out of the medication? Yes  Has the patient been seen for an appointment in the last year OR does the patient have an upcoming appointment? Yes  Can we respond through MyChart? Yes  Agent: Please be advised that Rx refills may take up to 3 business days. We ask that you follow-up with your pharmacy.

## 2023-12-02 ENCOUNTER — Ambulatory Visit (HOSPITAL_COMMUNITY)
Admission: RE | Admit: 2023-12-02 | Discharge: 2023-12-02 | Disposition: A | Discharge status: Home / Self Care | Source: Ambulatory Visit | Attending: Family Medicine | Admitting: Family Medicine

## 2023-12-02 ENCOUNTER — Ambulatory Visit: Payer: Self-pay | Admitting: Family Medicine

## 2023-12-02 DIAGNOSIS — R918 Other nonspecific abnormal finding of lung field: Secondary | ICD-10-CM | POA: Diagnosis not present

## 2023-12-02 DIAGNOSIS — R042 Hemoptysis: Secondary | ICD-10-CM | POA: Diagnosis not present

## 2023-12-02 DIAGNOSIS — R0602 Shortness of breath: Secondary | ICD-10-CM | POA: Diagnosis not present

## 2023-12-02 DIAGNOSIS — J90 Pleural effusion, not elsewhere classified: Secondary | ICD-10-CM | POA: Diagnosis not present

## 2023-12-03 ENCOUNTER — Other Ambulatory Visit: Payer: Self-pay

## 2023-12-03 DIAGNOSIS — R042 Hemoptysis: Secondary | ICD-10-CM

## 2023-12-03 DIAGNOSIS — R0609 Other forms of dyspnea: Secondary | ICD-10-CM

## 2023-12-10 ENCOUNTER — Ambulatory Visit: Admitting: Podiatry

## 2023-12-11 ENCOUNTER — Other Ambulatory Visit: Payer: Self-pay | Admitting: Family Medicine

## 2023-12-13 ENCOUNTER — Ambulatory Visit: Admitting: Podiatry

## 2023-12-13 DIAGNOSIS — Z91199 Patient's noncompliance with other medical treatment and regimen due to unspecified reason: Secondary | ICD-10-CM

## 2023-12-13 NOTE — Progress Notes (Signed)
 Cancel 24 hour

## 2023-12-15 ENCOUNTER — Ambulatory Visit

## 2023-12-21 ENCOUNTER — Telehealth: Payer: Self-pay | Admitting: Family Medicine

## 2023-12-21 ENCOUNTER — Other Ambulatory Visit: Payer: Self-pay | Admitting: Family Medicine

## 2023-12-21 MED ORDER — ALBUTEROL SULFATE HFA 108 (90 BASE) MCG/ACT IN AERS: 2.0000 | INHALATION_SPRAY | RESPIRATORY_TRACT | 4 refills | Status: DC | PRN

## 2023-12-21 NOTE — Telephone Encounter (Signed)
 Refill sent in as requested thank you

## 2023-12-21 NOTE — Telephone Encounter (Signed)
 Prescription Request  12/21/2023  LOV: 11/24/2023  What is the name of the medication or equipment? albuterol  (VENTOLIN  HFA) 108 (90 Base) MCG/ACT inhaler   Have you contacted your pharmacy to request a refill? Yes   Which pharmacy would you like this sent to?  [facility] [place], [place] - [id number] [place] [address] Phone: [phone] Fax: [phone]    Patient notified that their request is being sent to the clinical staff for review and that they should receive a response within 2 business days.   Please advise at [facility] [phone]

## 2023-12-23 ENCOUNTER — Encounter: Payer: Self-pay | Admitting: Internal Medicine

## 2023-12-23 NOTE — Telephone Encounter (Signed)
 Nurses Please order 7-day ZIO monitor for palpitations  Patient may use melatonin at nighttime to try to help with sleep but I would not recommend anything else in regards to sleep aids  Thanks-Dr. Glendia

## 2023-12-28 ENCOUNTER — Ambulatory Visit: Attending: Family Medicine

## 2023-12-28 ENCOUNTER — Other Ambulatory Visit: Payer: Self-pay

## 2023-12-28 DIAGNOSIS — R002 Palpitations: Secondary | ICD-10-CM

## 2023-12-28 NOTE — Progress Notes (Unsigned)
 EP to read.

## 2023-12-29 ENCOUNTER — Ambulatory Visit: Admitting: Podiatry

## 2023-12-30 ENCOUNTER — Ambulatory Visit (INDEPENDENT_AMBULATORY_CARE_PROVIDER_SITE_OTHER): Payer: Self-pay | Admitting: Family Medicine

## 2023-12-30 VITALS — BP 113/73 | HR 90

## 2023-12-30 DIAGNOSIS — I5020 Unspecified systolic (congestive) heart failure: Secondary | ICD-10-CM | POA: Diagnosis not present

## 2023-12-30 DIAGNOSIS — J90 Pleural effusion, not elsewhere classified: Secondary | ICD-10-CM

## 2023-12-30 DIAGNOSIS — G4700 Insomnia, unspecified: Secondary | ICD-10-CM | POA: Diagnosis not present

## 2023-12-30 MED ORDER — MIRTAZAPINE 7.5 MG PO TABS: 7.5000 mg | ORAL_TABLET | Freq: Every day | ORAL | 1 refills | Status: DC

## 2023-12-30 NOTE — Patient Instructions (Signed)
 Arranging thoracentesis.  Medication as prescribed.  Follow up with Dr. Glendia at his first available after the thoracentesis

## 2024-01-02 DIAGNOSIS — G4700 Insomnia, unspecified: Secondary | ICD-10-CM | POA: Insufficient documentation

## 2024-01-02 DIAGNOSIS — J90 Pleural effusion, not elsewhere classified: Secondary | ICD-10-CM | POA: Insufficient documentation

## 2024-01-02 NOTE — Assessment & Plan Note (Signed)
 Trial of Remeron

## 2024-01-02 NOTE — Progress Notes (Signed)
 Subjective:  Patient ID: [name], male    DOB: [birth year]  Age: 89 y.o. MRN: [id number]  CC: Shortness of breath, not sleeping well   HPI:  89 year old male with known heart failure with reduced ejection fraction, tobacco abuse presents for evaluation of the above.  Patient with ongoing dyspnea with exertion.  This is an ongoing issue.  He was most recently seen by his PCP for this last month.  Last echocardiogram revealed an ejection fraction of 30 to 35%.  He had a recent chest CT done on 9/18.  It revealed bilateral pleural effusions, left greater than right.  It also revealed left lower lobe consolidation and left upper lobe masslike infiltrate that is quite concerning.  Referral to pulmonology is in place.  His daughter is aware of the findings.  Will discuss today.  Additionally, he reports not sleeping well.  This has been a chronic issue as well.  Worsened by the recent death of his wife.  Patient Active Problem List   Diagnosis Date Noted   Pleural effusion on left 01/02/2024   Insomnia 01/02/2024   HFrEF (heart failure with reduced ejection fraction) (HCC) 12/30/2023   Thrombocytopenia 03/13/2022   LBBB (left bundle branch block) 03/12/2022   Stage 3b chronic kidney disease (HCC) 08/19/2021   DOE (dyspnea on exertion) 02/12/2020   Peripheral edema 01/29/2020   S/P total knee replacement, left 06/09/10 01/03/2018   AAA (abdominal aortic aneurysm) 09/24/2016   Cognitive dysfunction 12/27/2014   Hyperlipidemia 12/27/2014   Gout 01/11/2014   HTN (hypertension) 01/11/2014   Anemia, iron deficiency 04/03/2013   S/P total knee replacement, right 09/21/09 09/16/2010   GASTROESOPHAGEAL REFLUX DISEASE 05/08/2010   TOBACCO ABUSE 05/08/2010    Social Hx   Social History   Socioeconomic History   Marital status: Married    Spouse name: Johnston   Number of children: Not on file   Years of education: Not on file   Highest education level: Not on file  Occupational  History   Occupation: Work at hospital  Tobacco Use   Smoking status: Former    Types: Pipe    Start date: 12/04/1939    Quit date: 1970    Years since quitting: 55.8   Smokeless tobacco: Never   Tobacco comments:    quit smoking cigarettes in 1971  Vaping Use   Vaping status: Never Used  Substance and Sexual Activity   Alcohol use: No   Drug use: No   Sexual activity: Not Currently  Other Topics Concern   Not on file  Social History Narrative   Married, lives with wife Johnston. Daughter Ava helps patient.   Social Drivers of Corporate investment banker Strain: Low Risk  (03/18/2022)   Overall Financial Resource Strain (CARDIA)    Difficulty of Paying Living Expenses: Not hard at all  Food Insecurity: No Food Insecurity (03/12/2022)   Hunger Vital Sign    Worried About Running Out of Food in the Last Year: Never true    Ran Out of Food in the Last Year: Never true  Transportation Needs: No Transportation Needs (03/20/2022)   PRAPARE - Administrator, Civil Service (Medical): No    Lack of Transportation (Non-Medical): No  Physical Activity: Sufficiently Active (12/10/2020)   Exercise Vital Sign    Days of Exercise per Week: 5 days    Minutes of Exercise per Session: 30 min  Stress: No Stress Concern Present (12/10/2020)   [employer] of  Occupational Health - Occupational Stress Questionnaire    Feeling of Stress : Not at all  Social Connections: Moderately Integrated (12/10/2020)   Social Connection and Isolation Panel    Frequency of Communication with Friends and Family: More than three times a week    Frequency of Social Gatherings with Friends and Family: More than three times a week    Attends Religious Services: 1 to 4 times per year    Active Member of Clubs or Organizations: No    Attends Banker Meetings: Never    Marital Status: Married    Review of Systems Per HPI  Objective:  BP 113/73   Pulse 90      12/30/2023    3:51 PM  11/24/2023    1:40 PM 11/18/2023    1:43 PM  BP/Weight  Systolic BP 113 131 118  Diastolic BP 73 73 82  Wt. (Lbs)  152 155  BMI  22.45 kg/m2 22.89 kg/m2    Physical Exam Vitals and nursing note reviewed.  Constitutional:      General: He is not in acute distress.    Appearance: Normal appearance.  HENT:     Head: Normocephalic and atraumatic.  Eyes:     General:        Right eye: No discharge.        Left eye: No discharge.     Conjunctiva/sclera: Conjunctivae normal.  Cardiovascular:     Rate and Rhythm: Normal rate and regular rhythm.  Pulmonary:     Comments: Diminished breath sounds on the left (lower) Neurological:     Mental Status: He is alert.     Lab Results  Component Value Date   WBC 5.3 10/12/2023   HGB 11.8 (L) 10/12/2023   HCT 37.0 (L) 10/12/2023   PLT 161 10/12/2023   GLUCOSE 95 10/28/2023   CHOL 128 08/24/2022   TRIG 54 08/24/2022   HDL 79 08/24/2022   LDLCALC 37 08/24/2022   ALT 12 10/28/2023   AST 22 10/28/2023   NA 138 10/28/2023   K 3.7 10/28/2023   CL 104 10/28/2023   CREATININE 1.32 (H) 10/28/2023   BUN 17 10/28/2023   CO2 24 10/28/2023   TSH 3.511 03/13/2022   PSA 0.46 03/24/2013   INR 1.10 06/05/2010     Assessment & Plan:  Pleural effusion on left Assessment & Plan: Patient has bilateral pleural effusions but left is greater than right.  The left pleural effusion is quite large.  Etiology and prognosis unclear at this time although his CT findings are concerning for possible malignancy.  Arranging for thoracentesis.  Orders: -     Body fluid culture -     Lactate dehydrogenase (pleural or peritoneal fluid) -     Protein, pleural or peritoneal fluid -     Ambulatory referral to Interventional Radiology -     Pleural Fluid Cytology  HFrEF (heart failure with reduced ejection fraction) (HCC)  Insomnia, unspecified type Assessment & Plan: Trial of Remeron.  Orders: -     Mirtazapine; Take 1 tablet (7.5 mg total) by mouth at  bedtime.  Dispense: 90 tablet; Refill: 1   Follow-up:  Close follow up with PCP  Jacqulyn Ahle DO [facility]

## 2024-01-02 NOTE — Assessment & Plan Note (Signed)
 Patient has bilateral pleural effusions but left is greater than right.  The left pleural effusion is quite large.  Etiology and prognosis unclear at this time although his CT findings are concerning for possible malignancy.  Arranging for thoracentesis.

## 2024-01-04 ENCOUNTER — Encounter: Payer: Self-pay | Admitting: Internal Medicine

## 2024-01-04 ENCOUNTER — Ambulatory Visit: Admitting: Internal Medicine

## 2024-01-04 VITALS — BP 114/69 | HR 94 | Ht 69.0 in | Wt 154.8 lb

## 2024-01-04 DIAGNOSIS — Z87891 Personal history of nicotine dependence: Secondary | ICD-10-CM

## 2024-01-04 DIAGNOSIS — J90 Pleural effusion, not elsewhere classified: Secondary | ICD-10-CM | POA: Diagnosis not present

## 2024-01-04 NOTE — Patient Instructions (Signed)
 My office will be contacting you by phone for referral to [facility] for Left Thoracentesis   - if you don't hear back from my office within one week please call us  back or notify us  thru MyChart and we'll address it right away.   I will call you with results when available

## 2024-01-04 NOTE — Progress Notes (Unsigned)
 [name], male    DOB: 1925-10-19    MRN: [id number]   Brief patient profile:  89   yobm quit smoking completely around 2020 with onset of doe which apparently stabilized p quit now   referred to pulmonary clinic in [place]  01/04/2024 by Dr Alphonsa  for worse doe than baseline onset around 1st of Oct 2025 assoc with L > R Pl effusions ? Underlying  lung mass   Pt not previously seen by PCCM service.     History of Present Illness  01/04/2024  Pulmonary/ 1st office eval/ [facility] / [place] [facility]  Chief Complaint  Patient presents with   Establish Care    Shob  Saw fluid on lungs on cxr in chart    Dyspnea: drives his own car to church using [facility] parking and sits in back  Cough: some worse in am / occ bloody  Sleep: HOB 30 degrees x  recently  SABA use: some better initially but not now  02: none     No obvious day to day or daytime pattern/variability or assoc excess/ purulent sputum or mucus plugs or hemoptysis or cp or chest tightness, subjective wheeze or overt sinus or hb symptoms.    Also denies any obvious fluctuation of symptoms with weather or environmental changes or other aggravating or alleviating factors except as outlined above   No unusual exposure hx or h/o childhood pna/ asthma or knowledge of premature birth.  Current Allergies, Complete Past Medical History, Past Surgical History, Family History, and Social History were reviewed in [employer] record.  ROS  The following are not active complaints unless bolded Hoarseness, sore throat, dysphagia, dental problems, itching, sneezing,  nasal congestion or discharge of excess mucus or purulent secretions, ear ache,   fever, chills, sweats, unintended wt loss or wt gain, classically pleuritic or exertional cp,  orthopnea pnd or arm/hand swelling  or leg swelling, presyncope, palpitations, abdominal pain, anorexia, nausea, vomiting, diarrhea  or change in bowel habits or change in  bladder habits, change in stools or change in urine, dysuria, hematuria,  rash, arthralgias, visual complaints, headache, numbness, weakness or ataxia or problems with walking or coordination,  change in mood or  memory.            Outpatient Medications Prior to Visit  Medication Sig Dispense Refill   acetaminophen  (TYLENOL ) 650 MG CR tablet Take 650-1,300 mg by mouth every 8 (eight) hours as needed for pain.     albuterol  (VENTOLIN  HFA) 108 (90 Base) MCG/ACT inhaler Inhale 2 puffs into the lungs every 4 (four) hours as needed for wheezing or shortness of breath. 8.5 g 4   aspirin  81 MG chewable tablet Chew 1 tablet (81 mg total) by mouth daily.     dapagliflozin  propanediol (FARXIGA ) 5 MG TABS tablet Take 1 tablet (5 mg total) by mouth daily before breakfast. 30 tablet 5   furosemide  (LASIX ) 20 MG tablet Alternating Lasix  dosage with 20 mg one day then 10 mg the next day then repeat. 90 tablet 1   losartan  (COZAAR ) 25 MG tablet TAKE ONE TABLET BY MOUTH EVERY DAY 90 tablet 1   mirtazapine (REMERON) 7.5 MG tablet Take 1 tablet (7.5 mg total) by mouth at bedtime. 90 tablet 1   mupirocin  ointment (BACTROBAN ) 2 % Apply 1 Application topically 2 (two) times daily. 22 g 3   rosuvastatin  (CRESTOR ) 20 MG tablet TAKE ONE TABLET BY MOUTH EVERY DAY 90 tablet 3  spironolactone  (ALDACTONE ) 25 MG tablet TAKE 1/2 TABLET BY MOUTH EVERY DAY 30 tablet 1   No facility-administered medications prior to visit.    Past Medical History:  Diagnosis Date   Asthma    Diverticulosis    GERD (gastroesophageal reflux disease)    Gout    Heart murmur    Evaluation by a cardiologist years ago was reportedly negative   HTN (hypertension)    Osteoarthritis    Reflux       Objective:     BP 114/69   Pulse 94   Ht 5' 9 (1.753 m)   Wt 154 lb 12.8 oz (70.2 kg)   SpO2 96% Comment: ra  BMI 22.86 kg/m   SpO2: 96 % (ra) amb frail edlerly bm walks with cane   T   Amb stoic bm nad    HEENT : Oropharynx   clear          NECK :  without  apparent JVD/ palpable Nodes/TM    LUNGS: no acc muscle use,  Nl contour chest with Decreaesed bs/ dullness L>R   CV:  RRR  no s3 or murmur or increase in P2, and trace  to 1+ ptting LE Bilateral edema   ABD:  soft and nontender   MS:  Gait nl   ext warm without deformities Or obvious joint restrictions  calf tenderness, cyanosis or clubbing    SKIN: warm and dry without lesions    NEURO:  alert, approp, nl sensorium with  no motor or cerebellar deficits apparent.     I personally reviewed images and agree with radiology impression as follows:   Chest CTw/o contrast    12/02/23  Bilateral pleural effusions are noted left considerably greater than right. Left lower lobe consolidation is noted. Additionally some left upper lobe consolidation is seen centrally with some suspicion of underlying mass. Pleural based nodule is noted stable from the prior exam consistent with a benign etiology. No other pleural parenchymal nodules are seen.       Assessment       Assessment & Plan Bilateral pleural effusion See CT chest L > R assoc atx ? Mass - Echo12/29/23 ef 30% with MR AR mild mild LAE, RAE  - us  guided T centesis ordered 01/04/2024 >>>  Note  L > R effusion is atypical for chf though it is certainly contributing to effusions and mild peripheral edema and f/u echo is still pending   Rec Dx/ therapeutic Left thoracentesis since it is the larger of the two and assoc with ? Lung mass which actually may just be passive atx from the chronnic effusion  Discussed in detail all the  indications, usual  risks and alternatives  relative to the benefits with patient who agrees to proceed with w/u as outlined.        Each maintenance medication was reviewed in detail including emphasizing most importantly the difference between maintenance and prns and under what circumstances the prns are to be triggered using an action plan format where  appropriate.  Total time for H and P, chart review, counseling, reviewing hfa  device(s) and generating customized AVS unique to this office visit / same day charting = 30 min new pt eval         AVS  Patient Instructions  My office will be contacting you by phone for referral to [facility] for Left Thoracentesis   - if you don't hear back from my office within one week please call  us  back or notify us  thru MyChart and we'll address it right away.   I will call you with results when available    Ozell America, MD 01/04/2024

## 2024-01-04 NOTE — Assessment & Plan Note (Addendum)
 See CT chest L > R assoc atx ? Mass - Eco12/29/23 ef 30% with MR AR mild mild LAE, RAE  - us  guided T centesis >>>  Not L > R effusion is atypical for chf though this is certainly contributing to effusions and mild edema and f/u echo is still pending   Rec Dx/ therapeutic L thoracentesis since it is the larger of the two and assoc with ? Lung mass which actually may just be passive atx from the chronnic effusion  Discussed in detail all the  indications, usual  risks and alternatives  relative to the benefits with patient who agrees to proceed with w/u as outlined.            Each maintenance medication was reviewed in detail including emphasizing most importantly the difference between maintenance and prns and under what circumstances the prns are to be triggered using an action plan format where appropriate.  Total time for H and P, chart review, counseling, reviewing hfa  device(s) and generating customized AVS unique to this office visit / same day charting = 30 min new pt eval

## 2024-01-05 ENCOUNTER — Encounter: Payer: Self-pay | Admitting: Internal Medicine

## 2024-01-07 ENCOUNTER — Encounter (HOSPITAL_COMMUNITY): Payer: Self-pay

## 2024-01-07 ENCOUNTER — Ambulatory Visit (HOSPITAL_COMMUNITY)
Admission: RE | Admit: 2024-01-07 | Discharge: 2024-01-07 | Disposition: A | Discharge status: Home / Self Care | Source: Ambulatory Visit | Attending: Internal Medicine | Admitting: Internal Medicine

## 2024-01-07 VITALS — BP 103/63 | HR 79 | Temp 98.0°F | Resp 20

## 2024-01-07 DIAGNOSIS — I509 Heart failure, unspecified: Secondary | ICD-10-CM | POA: Diagnosis not present

## 2024-01-07 DIAGNOSIS — R0609 Other forms of dyspnea: Secondary | ICD-10-CM | POA: Diagnosis not present

## 2024-01-07 DIAGNOSIS — R918 Other nonspecific abnormal finding of lung field: Secondary | ICD-10-CM | POA: Diagnosis not present

## 2024-01-07 DIAGNOSIS — J90 Pleural effusion, not elsewhere classified: Secondary | ICD-10-CM | POA: Diagnosis not present

## 2024-01-07 DIAGNOSIS — I517 Cardiomegaly: Secondary | ICD-10-CM | POA: Diagnosis not present

## 2024-01-07 LAB — BODY FLUID CELL COUNT WITH DIFFERENTIAL
Eos, Fluid: 1 %
Lymphs, Fluid: 45 %
Monocyte-Macrophage-Serous Fluid: 8 % — ABNORMAL LOW (ref 50–90)
Neutrophil Count, Fluid: 46 % — ABNORMAL HIGH (ref 0–25)
Total Nucleated Cell Count, Fluid: 927 uL (ref 0–1000)

## 2024-01-07 LAB — GLUCOSE, PLEURAL OR PERITONEAL FLUID: Glucose, Fluid: 90 mg/dL

## 2024-01-07 LAB — LACTATE DEHYDROGENASE, PLEURAL OR PERITONEAL FLUID: LD, Fluid: 104 U/L — ABNORMAL HIGH (ref 3–23)

## 2024-01-07 LAB — PROTEIN, PLEURAL OR PERITONEAL FLUID: Total protein, fluid: 3.9 g/dL

## 2024-01-07 MED ORDER — LIDOCAINE HCL (PF) 2 % IJ SOLN
10.0000 mL | Freq: Once | INTRAMUSCULAR | Status: AC
  Administered 2024-01-07: 10 mL

## 2024-01-07 NOTE — Procedures (Signed)
 PROCEDURE SUMMARY:  Successful image-guided left thoracentesis. Yielded 1.2 liters of clear yellow fluid - procedure stopped at this amount per IR protocol for first time thoracentesis. Residual fluid remains on post procedure US  which is likely amenable to repeat thoracentesis if indicated.   Patient tolerated procedure well. EBL< 1 mL No immediate complications.  Specimen was sent for labs. Post procedure CXR shows no pneumothorax.  Please see imaging section of Epic for full dictation.  Clotilda DELENA Hesselbach PA-C 01/07/2024 1:35 PM

## 2024-01-07 NOTE — Progress Notes (Signed)
 Patient tolerated left Thoracentesis procedure well today and 1.2 Liters of yellow pleural fluid removed and sent to lab for processing. Patient verbalized understanding of discharge instructions and taken via wheelchair to xray at this time for post chest xray with no acute distress noted. Family in waiting area updated and given copy of discharge instructions.

## 2024-01-10 ENCOUNTER — Encounter: Payer: Self-pay | Admitting: Podiatry

## 2024-01-10 ENCOUNTER — Other Ambulatory Visit: Payer: Self-pay | Admitting: Family Medicine

## 2024-01-10 ENCOUNTER — Ambulatory Visit: Admitting: Podiatry

## 2024-01-10 DIAGNOSIS — M79675 Pain in left toe(s): Secondary | ICD-10-CM | POA: Diagnosis not present

## 2024-01-10 DIAGNOSIS — M79674 Pain in right toe(s): Secondary | ICD-10-CM | POA: Diagnosis not present

## 2024-01-10 DIAGNOSIS — B351 Tinea unguium: Secondary | ICD-10-CM

## 2024-01-10 NOTE — Progress Notes (Signed)
  Subjective:  Patient ID: [name], male    DOB: 1926-01-27,   MRN: [id number]  Chief Complaint  Patient presents with   Nail Problem    Cut my toenails.    89 y.o. male presents for concern of thickened elongated and painful nails that are difficult to trim. Requesting to have them trimmed today. Relates burning and tingling in their feet.    PCP:  Alphonsa Glendia LABOR, MD    . Denies any other pedal complaints. Denies n/v/f/c.   Past Medical History:  Diagnosis Date   Asthma    Diverticulosis    GERD (gastroesophageal reflux disease)    Gout    Heart murmur    Evaluation by a cardiologist years ago was reportedly negative   HTN (hypertension)    Osteoarthritis    Reflux     Objective:  Physical Exam: Vascular: DP/PT pulses 2/4 bilateral. CFT <3 seconds. Normal hair growth on digits. No edema.  Skin. No lacerations or abrasions bilateral feet. Right hallux nail thickened and dystrophic with bleeding surroudnign distal nail plate. Slighly lifted and small abrasion noted to undelrying nail bed. Remaining nails thickened dystrophic and elongated.  Musculoskeletal: MMT 5/5 bilateral lower extremities in DF, PF, Inversion and Eversion. Deceased ROM in DF of ankle joint.  Neurological: Sensation intact to light touch.   Assessment:   1. Pain due to onychomycosis of toenails of both feet       Plan:  Patient was evaluated and treated and all questions answered. -Mechanically debrided all nails 1-5 bilateral using sterile nail nipper and filed with dremel without incident  -Answered all patient questions -Patient to return  in 3 months for at risk foot care -Patient advised to call the office if any problems or questions arise in the meantime.   Asberry Failing, DPM

## 2024-01-14 LAB — CYTOLOGY - NON PAP

## 2024-01-15 ENCOUNTER — Ambulatory Visit: Payer: Self-pay | Admitting: Internal Medicine

## 2024-01-18 ENCOUNTER — Ambulatory Visit (HOSPITAL_COMMUNITY)
Admission: RE | Admit: 2024-01-18 | Discharge: 2024-01-18 | Disposition: A | Discharge status: Home / Self Care | Source: Ambulatory Visit | Attending: Physician Assistant | Admitting: Physician Assistant

## 2024-01-18 ENCOUNTER — Other Ambulatory Visit: Payer: Self-pay

## 2024-01-18 ENCOUNTER — Ambulatory Visit: Payer: Self-pay | Admitting: Physician Assistant

## 2024-01-18 ENCOUNTER — Telehealth: Payer: Self-pay | Admitting: Family Medicine

## 2024-01-18 DIAGNOSIS — I5020 Unspecified systolic (congestive) heart failure: Secondary | ICD-10-CM

## 2024-01-18 DIAGNOSIS — J90 Pleural effusion, not elsewhere classified: Secondary | ICD-10-CM

## 2024-01-18 DIAGNOSIS — R0602 Shortness of breath: Secondary | ICD-10-CM

## 2024-01-18 DIAGNOSIS — R600 Localized edema: Secondary | ICD-10-CM

## 2024-01-18 LAB — ECHOCARDIOGRAM COMPLETE
Area-P 1/2: 4.49 cm2
Calc EF: 30.3 %
MV M vel: 3.71 m/s
MV Peak grad: 55.1 mmHg
Radius: 0.4 cm
S' Lateral: 4.7 cm
Single Plane A2C EF: 26.6 %
Single Plane A4C EF: 30.7 %

## 2024-01-18 NOTE — Progress Notes (Signed)
*  PRELIMINARY RESULTS* Echocardiogram 2D Echocardiogram has been performed. I.V. attempted x 2.  Teresa Aida PARAS 01/18/2024, 12:33 PM

## 2024-01-18 NOTE — Telephone Encounter (Signed)
 His daughter Ava states he is having hallucinations They would like for him to be seen Please give him same-day appointment with me somewhere in the next 3 to 4 weeks sooner if any issues thank you

## 2024-01-18 NOTE — Progress Notes (Signed)
 Called patient number and spoke to his daughter, patient is now scheduled 12/10 w/ Wert.

## 2024-01-18 NOTE — Progress Notes (Signed)
 Called and spoke with [name] pts daughter in dpr she states pt is having a very hard time breathing and using rescue inhaler a lot more , relayed results and she confirmed understanding, placed cxr order   be sure he has f/u ov w/in next 4 weeks with cxr same day

## 2024-01-19 ENCOUNTER — Other Ambulatory Visit (HOSPITAL_COMMUNITY)
Admission: RE | Admit: 2024-01-19 | Discharge: 2024-01-19 | Disposition: A | Discharge status: Home / Self Care | Source: Ambulatory Visit | Attending: Physician Assistant | Admitting: Physician Assistant

## 2024-01-19 DIAGNOSIS — R600 Localized edema: Secondary | ICD-10-CM | POA: Insufficient documentation

## 2024-01-19 DIAGNOSIS — I5020 Unspecified systolic (congestive) heart failure: Secondary | ICD-10-CM | POA: Diagnosis not present

## 2024-01-19 DIAGNOSIS — R0602 Shortness of breath: Secondary | ICD-10-CM | POA: Diagnosis not present

## 2024-01-19 LAB — COMPREHENSIVE METABOLIC PANEL WITH GFR
ALT: 10 U/L (ref 0–44)
AST: 23 U/L (ref 15–41)
Albumin: 3.9 g/dL (ref 3.5–5.0)
Alkaline Phosphatase: 73 U/L (ref 38–126)
Anion gap: 12 (ref 5–15)
BUN: 18 mg/dL (ref 8–23)
CO2: 27 mmol/L (ref 22–32)
Calcium: 9.4 mg/dL (ref 8.9–10.3)
Chloride: 106 mmol/L (ref 98–111)
Creatinine, Ser: 1.45 mg/dL — ABNORMAL HIGH (ref 0.61–1.24)
GFR, Estimated: 44 mL/min — ABNORMAL LOW (ref >60)
Glucose, Bld: 99 mg/dL (ref 70–99)
Potassium: 4.2 mmol/L (ref 3.5–5.1)
Sodium: 146 mmol/L — ABNORMAL HIGH (ref 135–145)
Total Bilirubin: 2 mg/dL — ABNORMAL HIGH (ref 0.0–1.2)
Total Protein: 7.4 g/dL (ref 6.5–8.1)

## 2024-01-19 LAB — PRO BRAIN NATRIURETIC PEPTIDE: Pro Brain Natriuretic Peptide: 10382 pg/mL — ABNORMAL HIGH (ref <300.0)

## 2024-01-21 ENCOUNTER — Ambulatory Visit: Payer: Self-pay | Admitting: Physician Assistant

## 2024-01-21 DIAGNOSIS — Z79899 Other long term (current) drug therapy: Secondary | ICD-10-CM

## 2024-01-21 DIAGNOSIS — I5020 Unspecified systolic (congestive) heart failure: Secondary | ICD-10-CM

## 2024-01-21 NOTE — Telephone Encounter (Signed)
-----   Message from Lorette CINDERELLA Kapur sent at 01/21/2024  2:45 PM EST ----- Please notify: Kidney function (creatinine) has mildly worsened increasing from 1.32 to 1.45. The fluid overload enzyme (proBNP) is elevated to 10,382, although there is no previous for comparison.  Based on ECHO results also suggesting fluid overload, recommend increasing Lasix  to 20 mg daily x 7 days then return to alternating 20 and 10 mg dose of Lasix .  Encourage daily water  intake of at  least 60 oz of water  daily. If patient daughter is agreeable then order BMP to be completed the day of the next OV on 12/31/2023 prior to visit.   The sodium is considered high but is only very mildly above normal at 146 (Normal 135-145). Would recommend reducing daily sodium intake to < 2000 mg/daily.  ----- Message ----- From: Interface, Lab In [place] Sent: 01/19/2024  12:58 PM EST To: Lorette CINDERELLA Kapur, PA-C

## 2024-01-29 NOTE — Progress Notes (Unsigned)
 Cardiology Office Note   Date:  01/31/2024   ID:  [name], DOB 05-May-1925, MRN [id number]  PCP:  Alphonsa Glendia LABOR, MD  Cardiologist:   Vina Gull, MD   Pt presents for follow up of HFrEF    History of Present Illness: [name] is a 89 y.o. male with a history of HTN, AAA, atypical CP, HFrEF, LBBB  2018  Lexiscan  stress test normal Dec 2023 with SOB Echo in Dec 2023 showed LVEF 30 to 35%   Rx with IV lasix    Trop flat at 83, 85.     EKG with T wave inversion and intermitt LBBB  PCT showed coronary calcifications.   Treated medically     No 2024  PT admitted to [facility] with volume overload   Had stopped taking meds   Rx IV lasix . July 2025  Pt SOB with wheezing, green sputum   CXR with bilateral opacities Rx lasix     Echo ordered     OCtober 2025  PT underwent thoracentesis with 1.2 L clar yellow fluid from L lung.  Pt says he breathing was much better    Jan 18, 2024  Echo LVEF 30 to 25%   RVEF mildly reduced  The pt remains SOB again   Denies CP   Sleeping OK   Still with LE edema     Current Meds  Medication Sig   acetaminophen  (TYLENOL ) 650 MG CR tablet Take 650-1,300 mg by mouth every 8 (eight) hours as needed for pain.   albuterol  (VENTOLIN  HFA) 108 (90 Base) MCG/ACT inhaler Inhale 2 puffs into the lungs every 4 (four) hours as needed for wheezing or shortness of breath.   aspirin  81 MG chewable tablet Chew 1 tablet (81 mg total) by mouth daily.   dapagliflozin  propanediol (FARXIGA ) 5 MG TABS tablet Take 1 tablet (5 mg total) by mouth daily before breakfast.   furosemide  (LASIX ) 20 MG tablet Take 1 tablet (20 mg total) by mouth daily.   losartan  (COZAAR ) 25 MG tablet TAKE ONE TABLET BY MOUTH EVERY DAY   mirtazapine (REMERON) 7.5 MG tablet Take 1 tablet (7.5 mg total) by mouth at bedtime.   mupirocin  ointment (BACTROBAN ) 2 % APPLY TWICE DAILY.   rosuvastatin  (CRESTOR ) 20 MG tablet TAKE ONE TABLET BY MOUTH EVERY DAY   spironolactone  (ALDACTONE ) 25 MG tablet  TAKE 1/2 TABLET BY MOUTH EVERY DAY   [DISCONTINUED] furosemide  (LASIX ) 20 MG tablet Alternating Lasix  dosage with 20 mg one day then 10 mg the next day then repeat.     Allergies:   Penicillin g benzathine and Penicillins   Past Medical History:  Diagnosis Date   Asthma    Diverticulosis    GERD (gastroesophageal reflux disease)    Gout    Heart murmur    Evaluation by a cardiologist years ago was reportedly negative   HTN (hypertension)    Osteoarthritis    Reflux     Past Surgical History:  Procedure Laterality Date   APPENDECTOMY  2007   COLONOSCOPY  2003   COLONOSCOPY N/A 12/28/2012   Procedure: COLONOSCOPY;  Surgeon: Claudis RAYMOND Rivet, MD;  Location: [facility];  Service: Endoscopy;  Laterality: N/A;  255-rescheduled to 10:30 Ann notified pt   EYE SURGERY Right    cataract removal   INGUINAL HERNIA REPAIR     Right   JOINT REPLACEMENT     right total knee RIGHT total knee arthroplasty   TOTAL KNEE ARTHROPLASTY  09/2009  Right     Social History:  The patient  reports that he quit smoking about 55 years ago. His smoking use included pipe. He started smoking about 84 years ago. He has never used smokeless tobacco. He reports that he does not drink alcohol and does not use drugs.   Family History:  The patient's family history includes Arthritis in an other family member; Cancer in his brother; Heart attack in his father; Heart disease in an other family member; Hypertension in his father and mother; Uterine cancer in his maternal aunt.    ROS:  Please see the history of present illness. All other systems are reviewed and  Negative to the above problem except as noted.    PHYSICAL EXAM: VS:  BP (!) 102/58 (BP Location: Left Arm, Cuff Size: Normal)   Pulse 90   Ht 5' 11 (1.803 m)   Wt 145 lb (65.8 kg)   SpO2 93%   BMI 20.22 kg/m   GEN: Pt in no acute distress   Examined in chair HEENT: normal  Neck: JVP is elevated  Cardiac: RRR; no murmur   Respiratory:   clear to auscultation  Decreased BS at bases GI: soft, nontender Ext  1+ LE edema  EKG  SR with PACs  LBBB   90 bpm    Echo   Nov 2025   1. Left ventricular ejection fraction, by estimation, is 20 to 25%. The  left ventricle has severely decreased function. The left ventricle  demonstrates global hypokinesis. There is mild concentric left ventricular  hypertrophy. Indeterminate diastolic  filling due to E-A fusion.   2. Right ventricular systolic function is mildly reduced. The right  ventricular size is normal. There is normal pulmonary artery systolic  pressure. The estimated right ventricular systolic pressure is 34.6 mmHg.   3. Left atrial size was severely dilated.   4. Moderate to large left pleural effusion (at least based on visualized  collection at lung base).   5. The mitral valve is degenerative. Moderate mitral valve regurgitation.   6. Tricuspid valve regurgitation is mild to moderate.   7. The aortic valve is tricuspid. There is mild calcification of the  aortic valve. Aortic valve regurgitation is trivial. Aortic valve  sclerosis/calcification is present, without any evidence of aortic  stenosis.   8. The inferior vena cava is normal in size with <50% respiratory  variability, suggesting right atrial pressure of 8 mmHg.   Comparison(s): Prior images reviewed side by side. LVEF 20-25% with  septal-lateral dyssynchrony (LBBB present). Indeterminate diastolic  function. Moderate mitral regurgitation. Left pleural effusion as  described.   Lipid Panel    Component Value Date/Time   CHOL 128 08/24/2022 1206   TRIG 54 08/24/2022 1206   HDL 79 08/24/2022 1206   CHOLHDL 1.6 08/24/2022 1206   CHOLHDL 2.9 01/11/2014 0928   VLDL 23 01/11/2014 0928   LDLCALC 37 08/24/2022 1206      Wt Readings from Last 3 Encounters:  01/31/24 145 lb (65.8 kg)  01/04/24 154 lb 12.8 oz (70.2 kg)  11/24/23 152 lb (68.9 kg)      ASSESSMENT AND PLAN:  1  HFrEF   LVEF is severely  depressed on echo   VOlume increased on exam I would increase lasix  to 40 mg once then 20 mg daily    Will check BMET next week Will get CXR to evalutate lungs   Continue losartan   BP marginal   2  Hx CAD  OnCT scan  PT denies CP     3     HL  LDL 37  HDL 79   June 2024    4  REnal check BMET on Monday      Current medicines are reviewed at length with the patient today.  The patient does not have concerns regarding medicines.  Signed, Vina Gull, MD  01/31/2024 9:35 PM    [facility] [address] [place], [place], [place]  [address] Phone: [phone]; Fax: [phone]

## 2024-01-31 ENCOUNTER — Encounter: Payer: Self-pay | Admitting: Internal Medicine

## 2024-01-31 ENCOUNTER — Other Ambulatory Visit (HOSPITAL_COMMUNITY)
Admission: RE | Admit: 2024-01-31 | Discharge: 2024-01-31 | Disposition: A | Discharge status: Home / Self Care | Source: Ambulatory Visit | Attending: Physician Assistant | Admitting: Physician Assistant

## 2024-01-31 ENCOUNTER — Ambulatory Visit: Payer: Self-pay | Admitting: Physician Assistant

## 2024-01-31 ENCOUNTER — Ambulatory Visit: Attending: Internal Medicine | Admitting: Internal Medicine

## 2024-01-31 ENCOUNTER — Encounter: Payer: Self-pay | Admitting: Family Medicine

## 2024-01-31 VITALS — BP 102/58 | HR 90 | Ht 71.0 in | Wt 145.0 lb

## 2024-01-31 DIAGNOSIS — R609 Edema, unspecified: Secondary | ICD-10-CM | POA: Diagnosis not present

## 2024-01-31 DIAGNOSIS — Z79899 Other long term (current) drug therapy: Secondary | ICD-10-CM | POA: Insufficient documentation

## 2024-01-31 DIAGNOSIS — I10 Essential (primary) hypertension: Secondary | ICD-10-CM | POA: Diagnosis not present

## 2024-01-31 DIAGNOSIS — I5020 Unspecified systolic (congestive) heart failure: Secondary | ICD-10-CM | POA: Diagnosis not present

## 2024-01-31 LAB — BASIC METABOLIC PANEL WITH GFR
Anion gap: 10 (ref 5–15)
BUN: 18 mg/dL (ref 8–23)
CO2: 31 mmol/L (ref 22–32)
Calcium: 9.4 mg/dL (ref 8.9–10.3)
Chloride: 104 mmol/L (ref 98–111)
Creatinine, Ser: 1.46 mg/dL — ABNORMAL HIGH (ref 0.61–1.24)
GFR, Estimated: 44 mL/min — ABNORMAL LOW
Glucose, Bld: 108 mg/dL — ABNORMAL HIGH (ref 70–99)
Potassium: 3.8 mmol/L (ref 3.5–5.1)
Sodium: 145 mmol/L (ref 135–145)

## 2024-01-31 MED ORDER — FUROSEMIDE 20 MG PO TABS: 20.0000 mg | ORAL_TABLET | Freq: Every day | ORAL | 11 refills | Status: AC

## 2024-01-31 NOTE — Patient Instructions (Signed)
 Medication Instructions:  Take Lasix  40 mg one time then take Lasix  20 mg Daily   *If you need a refill on your cardiac medications before your next appointment, please call your pharmacy*  Lab Work: Your physician recommends that you return for lab work on Monday.    If you have labs (blood work) drawn today and your tests are completely normal, you will receive your results only by: MyChart Message (if you have MyChart) OR A paper copy in the mail If you have any lab test that is abnormal or we need to change your treatment, we will call you to review the results.  Testing/Procedures: A chest x-ray takes a picture of the organs and structures inside the chest, including the heart, lungs, and blood vessels. This test can show several things, including, whether the heart is enlarges; whether fluid is building up in the lungs; and whether pacemaker / defibrillator leads are still in place.   Follow-Up: At [facility], you and your health needs are our priority.  As part of our continuing mission to provide you with exceptional heart care, our providers are all part of one team.  This team includes your primary Cardiologist (physician) and Advanced Practice Providers or APPs (Physician Assistants and Nurse Practitioners) who all work together to provide you with the care you need, when you need it.  Your next appointment:    To be Determined   Provider:   You may see Vina Gull, MD or one of the following Advanced Practice Providers on your designated Care Team:   Laymon Qua, PA-C  [place], [place] Olivia Pavy, [place]     We recommend signing up for the patient portal called MyChart.  Sign up information is provided on this After Visit Summary.  MyChart is used to connect with patients for Virtual Visits (Telemedicine).  Patients are able to view lab/test results, encounter notes, upcoming appointments, etc.  Non-urgent messages can be sent to your provider as well.    To learn more about what you can do with MyChart, go to [email or web address].   Other Instructions Thank you for choosing [facility]!

## 2024-02-01 ENCOUNTER — Ambulatory Visit (HOSPITAL_COMMUNITY)
Admission: RE | Admit: 2024-02-01 | Discharge: 2024-02-01 | Disposition: A | Discharge status: Home / Self Care | Source: Ambulatory Visit | Attending: Internal Medicine | Admitting: Internal Medicine

## 2024-02-01 ENCOUNTER — Telehealth: Payer: Self-pay

## 2024-02-01 DIAGNOSIS — I517 Cardiomegaly: Secondary | ICD-10-CM | POA: Diagnosis not present

## 2024-02-01 DIAGNOSIS — J90 Pleural effusion, not elsewhere classified: Secondary | ICD-10-CM | POA: Diagnosis not present

## 2024-02-01 DIAGNOSIS — J811 Chronic pulmonary edema: Secondary | ICD-10-CM | POA: Diagnosis not present

## 2024-02-01 DIAGNOSIS — I509 Heart failure, unspecified: Secondary | ICD-10-CM | POA: Diagnosis not present

## 2024-02-01 DIAGNOSIS — I5020 Unspecified systolic (congestive) heart failure: Secondary | ICD-10-CM | POA: Diagnosis not present

## 2024-02-01 NOTE — Telephone Encounter (Signed)
 Called and spoke with pts daughter (DPR) pt had appt with Cardiology recently and had x-ray today. Pts daughter called to reschedule ov for 12/10. Pt has been moved to 12/9.  Dr. Darlean pts daughter would like to know if pt needs to have another x-ray the day of his appt or if the one he had today will work.  Please advise.  Routing to Dr. Darlean and tinnie

## 2024-02-01 NOTE — Telephone Encounter (Signed)
 Copied from CRM [id number]. Topic: Clinical - Medical Advice >> Jan 31, 2024  5:01 PM Joesph PARAS wrote: Reason for CRM: Patient's daughter is calling in to state that she needs to speak with Dr. Darlean personally and will not be speaking to anyone else. Please return call to patient.   ATC x1. LMTCB Routing to [place].

## 2024-02-02 ENCOUNTER — Encounter: Payer: Self-pay | Admitting: Family Medicine

## 2024-02-02 ENCOUNTER — Ambulatory Visit: Admitting: Family Medicine

## 2024-02-02 VITALS — BP 95/57 | HR 73 | Temp 98.6°F | Wt 135.0 lb

## 2024-02-02 DIAGNOSIS — M6284 Sarcopenia: Secondary | ICD-10-CM

## 2024-02-02 DIAGNOSIS — R634 Abnormal weight loss: Secondary | ICD-10-CM

## 2024-02-02 DIAGNOSIS — J90 Pleural effusion, not elsewhere classified: Secondary | ICD-10-CM | POA: Diagnosis not present

## 2024-02-02 NOTE — Telephone Encounter (Signed)
ATC x1.  LMTCB. 

## 2024-02-02 NOTE — Progress Notes (Signed)
   Subjective:    Patient ID: [name], male    DOB: 1925/10/18, 89 y.o.   MRN: [id number] Pt has been having hallucinations and also has a sore on his buttocks that he would like looked at.  HPI He has a sore spot on his buttocks gives him pain when he sits He is losing significant weight Not eating well He states he has a okay appetite but other times he just does not have much of an appetite Family is concerned and trying to be supportive he does have hallucinations in the evening time he states by himself late at night but during the day has family with him He has a possibility of a developing tumor in the left lung Pleural effusion did not show any malignant cells Under the care of pulmonary   Review of Systems     Objective:   Physical Exam Very nice gentleman Lungs are clear on the right diminished on the left significant effusion is noted in regards to diminished breath sounds and findings on recent x-ray Extremities no edema       Assessment & Plan:  I am concerned about the patient's weight loss He also has pleural effusion We will touch base with Dr. Darlean his pulmonologist If ongoing weight loss and progressive decline may need follow-up CT scan of the chest to exclude tumor Heart failure severe under cardiology care Hallucinations related to age and declining health already on medication to try to help we will look into other options

## 2024-02-03 ENCOUNTER — Other Ambulatory Visit: Payer: Self-pay

## 2024-02-03 ENCOUNTER — Telehealth: Payer: Self-pay | Admitting: Family Medicine

## 2024-02-03 DIAGNOSIS — J90 Pleural effusion, not elsewhere classified: Secondary | ICD-10-CM

## 2024-02-03 NOTE — Telephone Encounter (Signed)
 Copied from CRM #[id number]. Topic: Clinical - Medical Advice >> Feb 03, 2024 10:27 AM Tiffini S wrote: Reason for CRM: Patient daughter [name] is calling her father blood pressure reading as 95/57- said the reading is too low and would like to discuss with the provider.   Please call the patient daughter at [phone] about recommendations.

## 2024-02-03 NOTE — Telephone Encounter (Signed)
-----   Message from Ozell America sent at 02/03/2024 11:28 AM EST ----- Yes, we nee to repeat  thoracentesis on Left with cytology only - we'll set it up ----- Message ----- From: Alphonsa Glendia LABOR, MD Sent: 02/03/2024  10:40 AM EST To: Ozell KATHEE America, MD  Erskin Shed  I saw this patient yesterday He continues to lose weight He is down to 135 He does have a significant pleural effusion His shortness of breath is not severe Is there any benefit to pleurocentesis again for this patient?  Also in regards to the left lobar finding would there be any benefit to doing a follow-up CT scan in 3 months from the most previous 1-that would be mid December  Even though he is elderly he has CHF I concerned myself with the possibility of cancer as well  Appreciate your input-Scott

## 2024-02-03 NOTE — Telephone Encounter (Signed)
 Called and spoke with pt daughter in dpr to relay information about the cxr, she confirmed her understanding

## 2024-02-03 NOTE — Telephone Encounter (Signed)
 Nurses-please see MyChart message please talk with patient daughter regarding this  Also I have messaged cardiology regarding this to see if they recommend any changes I have not received a reply just yet

## 2024-02-03 NOTE — Telephone Encounter (Signed)
 Placed order and called to inform pt (daughter in dpr answered)

## 2024-02-07 ENCOUNTER — Ambulatory Visit: Payer: Self-pay | Admitting: Internal Medicine

## 2024-02-07 ENCOUNTER — Ambulatory Visit (HOSPITAL_COMMUNITY)
Admission: RE | Admit: 2024-02-07 | Discharge: 2024-02-07 | Disposition: A | Discharge status: Home / Self Care | Source: Ambulatory Visit | Attending: Internal Medicine | Admitting: Internal Medicine

## 2024-02-07 ENCOUNTER — Encounter (HOSPITAL_COMMUNITY): Payer: Self-pay

## 2024-02-07 ENCOUNTER — Ambulatory Visit (HOSPITAL_COMMUNITY)
Admission: RE | Admit: 2024-02-07 | Discharge: 2024-02-07 | Disposition: A | Source: Ambulatory Visit | Attending: Internal Medicine | Admitting: Internal Medicine

## 2024-02-07 DIAGNOSIS — J90 Pleural effusion, not elsewhere classified: Secondary | ICD-10-CM | POA: Insufficient documentation

## 2024-02-07 DIAGNOSIS — R002 Palpitations: Secondary | ICD-10-CM

## 2024-02-07 DIAGNOSIS — Z79899 Other long term (current) drug therapy: Secondary | ICD-10-CM

## 2024-02-07 DIAGNOSIS — I10 Essential (primary) hypertension: Secondary | ICD-10-CM

## 2024-02-07 DIAGNOSIS — I5020 Unspecified systolic (congestive) heart failure: Secondary | ICD-10-CM

## 2024-02-07 MED ORDER — LIDOCAINE HCL (PF) 2 % IJ SOLN
INTRAMUSCULAR | Status: AC
  Filled 2024-02-07: qty 10

## 2024-02-07 MED ORDER — LIDOCAINE HCL (PF) 2 % IJ SOLN
10.0000 mL | Freq: Once | INTRAMUSCULAR | Status: AC
  Administered 2024-02-07: 10 mL

## 2024-02-07 NOTE — Progress Notes (Signed)
 Patient tolered Left Thoracentesis procedure well today and 1.5 Liters of amber colored fluid removed. Patient verbalized understanding of discharge instructions and transported via wheelchair to xray at this time for post chest xray with no acute distress noted.

## 2024-02-07 NOTE — Procedures (Signed)
 PROCEDURE SUMMARY:  Successful image-guided diagnostic and therapeutic thoracentesis from the left chest.  Yielded 1.5 liters of hazy amber fluid.  No immediate complications.  EBL: zero Patient tolerated well.   Specimen sent for labs.  Post-procedure CXR ordered and reviewed by Dr. Wilkie Lent prior to departure from department.   Please see imaging section of Epic for full dictation.  Raihana Balderrama B Carmelo Reidel NP 02/07/2024 2:11 PM

## 2024-02-08 ENCOUNTER — Ambulatory Visit: Admitting: Emergency Medicine

## 2024-02-09 LAB — CYTOLOGY - NON PAP

## 2024-02-10 ENCOUNTER — Ambulatory Visit: Payer: Self-pay | Admitting: Internal Medicine

## 2024-02-15 NOTE — Progress Notes (Signed)
 Atx x1 lmtcb

## 2024-02-18 NOTE — Telephone Encounter (Signed)
 Patient's daughter Brigida Mink informed and verbalized understanding of plan. Offered appointment to see Johnson on 03/01/2024 @1 :30 pm but declined due to seeing PCP at 3:10 pm on 03/01/2024.

## 2024-02-18 NOTE — Progress Notes (Signed)
 Pt needs next avail appt with wert

## 2024-02-22 ENCOUNTER — Ambulatory Visit: Admitting: Internal Medicine

## 2024-02-22 ENCOUNTER — Encounter: Payer: Self-pay | Admitting: Internal Medicine

## 2024-02-22 VITALS — BP 102/65 | HR 105 | Ht 71.0 in | Wt 148.0 lb

## 2024-02-22 DIAGNOSIS — K089 Disorder of teeth and supporting structures, unspecified: Secondary | ICD-10-CM | POA: Diagnosis not present

## 2024-02-22 DIAGNOSIS — J90 Pleural effusion, not elsewhere classified: Secondary | ICD-10-CM | POA: Diagnosis not present

## 2024-02-22 NOTE — Assessment & Plan Note (Addendum)
 Risk for pulmonary infection and empyema reviewed - should have this problem addressed as a priority.          Each maintenance medication was reviewed in detail including emphasizing most importantly the difference between maintenance and prns and under what circumstances the prns are to be triggered using an action plan format where appropriate.  Total time for H and P, chart review, counseling, reviewing hfa device(s) and generating customized AVS unique to this office visit / same day charting = 32 min

## 2024-02-22 NOTE — Progress Notes (Addendum)
 [name], male    DOB: 1926-03-11    MRN: [id number]   Brief patient profile:  89   yobm quit smoking completely around 2020 with onset of doe which apparently stabilized p quit now   referred to pulmonary clinic in [place]  01/04/2024 by Dr Alphonsa  for worse doe than baseline onset around 1st of Oct 2025 assoc with L > R Pl effusions ? Underlying  lung mass   Pt not previously seen by PCCM service.     History of Present Illness  01/04/2024  Pulmonary/ 1st office eval/ [name] / [place] Office  Chief Complaint  Patient presents with   Establish Care    Shob  Saw fluid on lungs on cxr in chart    Dyspnea: drives his own car to church using [facility] parking and sits in back  Cough: some worse in am / occ bloody  Sleep: HOB 30 degrees x  recently  SABA use: some better initially but not now  02: none  Rec Patient Instructions  My office will be contacting you by phone for referral to [facility] for Left Thoracentesis   - if you don't hear back from my office within one week please call us  back or notify us  thru MyChart and we'll address it right away.    - us  guided Left T centesis  01/07/24 =  1.2 liters Prot 3.9  wbc 927 with P > L  cytology >>>neg  - us  guided Left T  centesis   02/07/24  1.5 liters of hazy amber fluid > neg cyt  -  Echo  11/18/23  EF 20-25%  Mod MR, severe LAE    02/22/2024  f/u ov/ office/[name] re: CHF  maint on ventolin  prn   Chief Complaint  Patient presents with   Shortness of Breath    Doe - feels a lot better since fluid removed like a new person Coughing with some yellow and red tinged mucus   Dyspnea:  walking with  4 pronged walker  Cough: better since fluid removed scant slt discolored /blood tinged mostly in AM  Sleeping: able to lie flat now  s noct resp cc  SABA use: rarely  02: none     No obvious day to day or daytime variability or assoc excess/ purulent sputum or mucus plugs or hemoptysis or cp or chest tightness, subjective  wheeze or overt sinus or hb symptoms.    Also denies any obvious fluctuation of symptoms with weather or environmental changes or other aggravating or alleviating factors except as outlined above   No unusual exposure hx or h/o childhood pna/ asthma or knowledge of premature birth.  Current Allergies, Complete Past Medical History, Past Surgical History, Family History, and Social History were reviewed in [employer] record.  ROS  The following are not active complaints unless bolded Hoarseness, sore throat, dysphagia, dental problems, itching, sneezing,  nasal congestion or discharge of excess mucus or purulent secretions, ear ache,   fever, chills, sweats, unintended wt loss or wt gain, classically pleuritic or exertional cp,  orthopnea pnd or arm/hand swelling  or leg swelling, presyncope, palpitations, abdominal pain, anorexia, nausea, vomiting, diarrhea  or change in bowel habits or change in bladder habits, change in stools or change in urine, dysuria, hematuria,  rash, arthralgias, visual complaints, headache, numbness, weakness or ataxia or problems with walking or coordination,  change in mood or  memory.        Current Meds  Medication Sig   acetaminophen  (TYLENOL ) 650 MG CR tablet Take 650-1,300 mg by mouth every 8 (eight) hours as needed for pain.   albuterol  (VENTOLIN  HFA) 108 (90 Base) MCG/ACT inhaler Inhale 2 puffs into the lungs every 4 (four) hours as needed for wheezing or shortness of breath.   aspirin  81 MG chewable tablet Chew 1 tablet (81 mg total) by mouth daily.   dapagliflozin  propanediol (FARXIGA ) 5 MG TABS tablet Take 1 tablet (5 mg total) by mouth daily before breakfast.   furosemide  (LASIX ) 20 MG tablet Take 1 tablet (20 mg total) by mouth daily.   losartan  (COZAAR ) 25 MG tablet TAKE ONE TABLET BY MOUTH EVERY DAY   mirtazapine  (REMERON ) 7.5 MG tablet Take 1 tablet (7.5 mg total) by mouth at bedtime.   mupirocin  ointment (BACTROBAN ) 2 % APPLY  TWICE DAILY.   potassium chloride  (KLOR-CON  M) 10 MEQ tablet Take 10 mEq by mouth daily.   rosuvastatin  (CRESTOR ) 20 MG tablet TAKE ONE TABLET BY MOUTH EVERY DAY   spironolactone  (ALDACTONE ) 25 MG tablet TAKE 1/2 TABLET BY MOUTH EVERY DAY            Past Medical History:  Diagnosis Date   Asthma    Diverticulosis    GERD (gastroesophageal reflux disease)    Gout    Heart murmur    Evaluation by a cardiologist years ago was reportedly negative   HTN (hypertension)    Osteoarthritis    Reflux       Objective:    Wts   02/22/2024       148    02/02/24 135 lb (61.2 kg)  01/31/24 145 lb (65.8 kg)  01/04/24 154 lb 12.8 oz (70.2 kg)      Vital signs reviewed  02/22/2024  - Note at rest 02 sats  91% on RA   General appearance:    frail bm very slow moving cane pace       HEENT : Oropharynx  clear/ top denture and very poor lower dentition     Nasal turbinates nl    NECK :  without  apparent JVD/ palpable Nodes/TM    LUNGS: no acc muscle use,  Nl contour chest with decreased bs/ dullness L base    CV:  RRR  no s3 or murmur or increase in P2, and 1+ pitting both LEs  ABD:  soft and nontender   MS:   ext warm without deformities Or obvious joint restrictions  calf tenderness, cyanosis or clubbing    SKIN: warm and dry without lesions    NEURO:  alert, approp, nl sensorium with  no motor or cerebellar deficits apparent.          Assessment        Assessment & Plan Bilateral pleural effusion See CT chest L > R assoc atx ? [place] - Echo12/29/23 ef 30% with MR AR  mild LAE, RAE  - us  guided Left T centesis  01/07/24 =  1.2 liters Prot 3.9  wbc 927 with P > L  cytology >>>neg  - us  guided Left T  centesis   02/07/24  1.5 liters of hazy amber fluid > neg cyt  -  Echo  11/18/23  EF 20-25%  Mod MR, severe LAE   Pesistent L effusion with marked clinical improvement but likely to recur if we can't keep vol status lower due to limiting BP / BUN/ creat > defer all diuretic  rx to Dr Okey and f/u here q  6 weeks with interim repeat T centesis prn   Discussed in detail all the  indications, usual  risks and alternatives (eg pleurex)  relative to the benefits with patient and daugher who agree to proceed with conservative f/u as outlined    Poor dentition Risk for pulmonary infection and empyema reviewed - should have this problem addressed as a priority.          Each maintenance medication was reviewed in detail including emphasizing most importantly the difference between maintenance and prns and under what circumstances the prns are to be triggered using an action plan format where appropriate.  Total time for H and P, chart review, counseling, reviewing hfa device(s) and generating customized AVS unique to this office visit / same day charting = 32 min             AVS  Patient Instructions  No change in medications   Please see your dentist as soon as possible as dental infections can cause pulmonary infections and fluid build up like you presently already  have   All fluid pill and heart mediations thru Dr Vina Gull   Please schedule a follow up office visit in 6 weeks, call sooner if needed      Ozell America, MD 02/22/2024

## 2024-02-22 NOTE — Assessment & Plan Note (Addendum)
 See CT chest L > R assoc atx ? [place] - Echo12/29/23 ef 30% with MR AR  mild LAE, RAE  - us  guided Left T centesis  01/07/24 =  1.2 liters Prot 3.9  wbc 927 with P > L  cytology >>>neg  - us  guided Left T  centesis   02/07/24  1.5 liters of hazy amber fluid > neg cyt  -  Echo  11/18/23  EF 20-25%  Mod MR, severe LAE   Pesistent L effusion with marked clinical improvement but likely to recur if we can't keep vol status lower due to limiting BP / BUN/ creat > defer all diuretic rx to Dr Okey and f/u here q 6 weeks with interim repeat T centesis prn   Discussed in detail all the  indications, usual  risks and alternatives (eg pleurex)  relative to the benefits with patient and daugher who agree to proceed with conservative f/u as outlined

## 2024-02-22 NOTE — Patient Instructions (Addendum)
 No change in medications   Please see your dentist as soon as possible as dental infections can cause pulmonary infections and fluid build up like you presently already  have   All fluid pill and heart mediations thru Dr Vina Gull   Please schedule a follow up office visit in 6 weeks, call sooner if needed

## 2024-02-23 ENCOUNTER — Ambulatory Visit: Admitting: Internal Medicine

## 2024-03-01 ENCOUNTER — Ambulatory Visit: Admitting: Family Medicine

## 2024-03-01 VITALS — BP 102/64 | Ht 71.0 in

## 2024-03-01 DIAGNOSIS — M25611 Stiffness of right shoulder, not elsewhere classified: Secondary | ICD-10-CM

## 2024-03-01 DIAGNOSIS — M6284 Sarcopenia: Secondary | ICD-10-CM | POA: Diagnosis not present

## 2024-03-01 MED ORDER — PREDNISONE 5 MG PO TABS: ORAL_TABLET | ORAL | 0 refills | Status: DC

## 2024-03-01 NOTE — Progress Notes (Signed)
° °  Subjective:    Patient ID: [name], male    DOB: May 25, 1925, 89 y.o.   MRN: [id number]  HPI Patient with underlying heart failure Also with significant shortness of breath but recently had thoracentesis and breathing much better appetite doing better is gaining weight.  Swelling in his legs consistent Relates some right shoulder pain soreness and discomfort for no particular reason no known injury Patient denies any shortness of breath at the moment energy level doing better appetite doing better gaining weight   Review of Systems     Objective:   Physical Exam General-in no acute distress Eyes-no discharge Lungs-respiratory rate normal, CTA CV-no murmurs,RRR Extremities skin warm dry mild lower leg edema Neuro grossly normal Behavior normal, alert Sarcopenia noted patient encouraged to take an adequate protein       Assessment & Plan:  Patient to go ahead with follow-up visits with pulmonary I am very impressed with his weight gain and he appears healthier I am still concerned regarding long-term prognosis Continue current measures.  He has a follow-up visit in January  He does have some right shoulder tenderness so recommend a short course of prednisone  taper we do not have any Depo-Medrol for joint injection

## 2024-03-07 NOTE — Progress Notes (Signed)
 " Cardiology Office Note:  .   Date:  03/14/2024  ID:  CHILTON SALLADE, DOB 25-Oct-1925, MRN [id number] PCP: Alphonsa Glendia LABOR, MD  [facility] Providers Cardiologist:  Vina Gull, MD {  History of Present Illness: .   [name] is a 89 y.o. male  with PMHx of atypical chest pain, HFrEF, pleural effusion, HTN, HLD, AAA, LBBB who reports to [facility] office for follow up.   Pertinent cardiac medical history:  AAA 03/2020 US  showed enlargement of the aorta and iliac artery Jun 10, 2023 US : stable dilation of the mid abdominal aorta with largest measurement 3.1 cm  Normal Lexiscan  2018 03/04/2021 echo: EF 30 to 35%. Per 03/17/2022 note, assumed ischemic etiology based on CT evidence of coronary calcification in LAD in 2023. HFrEF 02/2022: 30-35% US  guided thoracentesis 01/07/2024 with 1.2 L output ECHO 01/2024: EF 20 to 25%, global hypokinesis, mild concentric LVH, mildly reduced RV function, severely dilated LA, moderate to large left pleural effusion, moderate MV regurgitation, mild to moderate TV regurgitation, AV sclerosis/calcification without AS US  guided thoracentesis 02/07/2024 with 1.5 L output  Last seen in heartcare 01/31/2024 by Dr. Gull for follow-up.  Reported ongoing SOB and LE edema.  Increased Lasix  to 40 mg X1 then 20 mg daily.  Continued on ASA 81 mg daily, Farxiga  5 mg daily, losartan  25 mg daily, Crestor  20 mg daily, Spironolactone  12.5 mg daily.  Labs for BNP, BMP and TSH still pending.  Follow-up CXR 11/18 showed worsening left pleural effusion moderate to large, improving right sided pleural effusion small, and enlarged heart with interstitial edema.  Underwent thoracentesis 11/24 yielding 1.5 L of pleural fluid.  Follow-up CXR showed persistent but decreased left pleural effusion after interval thoracentesis and stable trace right pleural effusion.  Last seen by pulmonary 02/22/2024 with plan of diuretic deferred to cardiology and f/u q 6 weeks with interim  repeat thoracentesis prn.   Today, accompanied by daughter. Reports significant improvement in breathing since thoracentesis, however he still get intermittent DOE with walking sometimes but not at rest. He feel like fluid is building back up since he has been coughing and using inhaler for the past week. He does not use home scale to monitor weights. Also reports improvement in LE edema but still present. Denies chest pain, palpitations, syncope, presyncope, dizziness, orthopnea, PND, significant weight changes, acute bleeding, or claudication.  Reports compliance with medications. He eats whatever he wants. He use a cane or walker to ambulate. He is not very active and mainly sits around watching TV. He is still able to do ADL's without assistance.   ROS: 10 point review of system has been reviewed and considered negative except ones been listed in the HPI.   Studies Reviewed: SABRA   AAA Duplex 06-10-23 Summary:  Abdominal Aorta: There is evidence of abnormal dilatation of the mid  Abdominal aorta. The largest aortic measurement is 3.1 cm. The largest  aortic diameter remains essentially unchanged compared to prior exam.  Previous diameter measurement was 3.0 cm  obtained on 03/20/2020.   Stenosis:  Widely patent bilateral common and external iliac arteries without  evidence of focal stenosis.   IVC/Iliac: Patent IVC.   ECHO IMPRESSIONS 01/18/2024  1. Left ventricular ejection fraction, by estimation, is 20 to 25%. The  left ventricle has severely decreased function. The left ventricle  demonstrates global hypokinesis. There is mild concentric left ventricular  hypertrophy. Indeterminate diastolic  filling due to E-A fusion.   2. Right ventricular systolic function  is mildly reduced. The right  ventricular size is normal. There is normal pulmonary artery systolic  pressure. The estimated right ventricular systolic pressure is 34.6 mmHg.   3. Left atrial size was severely dilated.   4.  Moderate to large left pleural effusion (at least based on visualized  collection at lung base).   5. The mitral valve is degenerative. Moderate mitral valve regurgitation.   6. Tricuspid valve regurgitation is mild to moderate.   7. The aortic valve is tricuspid. There is mild calcification of the  aortic valve. Aortic valve regurgitation is trivial. Aortic valve  sclerosis/calcification is present, without any evidence of aortic  stenosis.   8. The inferior vena cava is normal in size with <50% respiratory  variability, suggesting right atrial pressure of 8 mmHg.   Comparison(s): Prior images reviewed side by side. LVEF 20-25% with  septal-lateral dyssynchrony (LBBB present). Indeterminate diastolic  function. Moderate mitral regurgitation. Left pleural effusion as  described.   Physical Exam:   VS:  BP (!) 100/58 (BP Location: Right Arm, Cuff Size: Normal)   Pulse 88   Ht 5' 11 (1.803 m)   Wt 144 lb (65.3 kg)   SpO2 93%   BMI 20.08 kg/m    Wt Readings from Last 3 Encounters:  03/10/24 144 lb (65.3 kg)  02/22/24 148 lb (67.1 kg)  02/02/24 135 lb (61.2 kg)    GEN: Well nourished, well developed in no acute distress while sitting in chair. Accompanied by daughter.  NECK: No JVD; No carotid bruits CARDIAC: RRR, no murmurs, rubs, gallops RESPIRATORY: Diminished breath sounds more on L than right but no crackles. ABDOMEN: Soft, non-tender, non-distended EXTREMITIES:  Trace edema but no pitting edema; No deformity   ASSESSMENT AND PLAN: .   Chronic HFrEF Reports significant improvement in DOE since thoracentesis but still present sometimes with walking. Also reports cough and using inhaler more frequently over the last week. He suspect fluid is building back up.  Diminished breath sounds more on L than right but no crackles. No LE edema, JVD, or weight gain. 155 lbs in 11/2023 and 144 lbs today.  Continue daily dose of Farxiga  10 mg, losartan  25 mg, spironolactone  12.5 mg, Lasix   20 mg daily.  Can not tolerate BB due to bradycardia. Complete pending labs: BNP, BMP, TSH.  With soft BP, if creatinine elevated then will consider decreasing Losartan  to 12.5 mg.  Recommended following up with pulmonary if worsening SOB due to persistent pleural effusions.  Encouraged low sodium diet, fluid restriction <2L, and daily weights.  Educated to contact our office for weight gain of 2 lbs overnight or 5 lbs in one week. ED precautions discussed.    Persistent Pleural Effusions US  thoracentesis x 2 as above.  Followed by Pulmonary. Last seen 02/22/2024 with plan of diuretic deferred to cardiology and f/u q 6 weeks with interim repeat thoracentesis prn.    Palpitations  Denies any palpitations.  Previously offered Zio but patient declined.  Discussed if palpitations worsen in frequency or duration to contact office.   Atypical CP  Coronary calcification on CT HLD, LDL goal < 100 2018 Lexiscan : normal  2023 CT showed coronary calcification in the LAD distribution.   01/2023: AST/ALT WNL; LDL 37 Review previous EKG 01/2023: NSR, HR 70, chronic LBBB Denies any anginal symptoms.  No need for further ischemic testing at this time.  Continue ASA 81 mg daily, losartan  25 mg daily, Crestor  20 mg daily If CP reoccurs can consider additional  antianginal medication. Can consider Ranexa in future if interested.  Avoid Imdur due to history of headaches.  Would avoid BB due to previous bradycardia response.    AAA Will continue to monitor   Vertigo  Follow with PCP     Dispo: Follow up in 3 month with Scottie, PA-C.  Signed, Lorette CINDERELLA Kapur, PA-C  "

## 2024-03-10 ENCOUNTER — Encounter: Payer: Self-pay | Admitting: Physician Assistant

## 2024-03-10 ENCOUNTER — Other Ambulatory Visit (HOSPITAL_COMMUNITY)
Admission: RE | Admit: 2024-03-10 | Discharge: 2024-03-10 | Disposition: A | Discharge status: Home / Self Care | Source: Ambulatory Visit | Attending: Internal Medicine | Admitting: Internal Medicine

## 2024-03-10 ENCOUNTER — Ambulatory Visit: Attending: Physician Assistant | Admitting: Physician Assistant

## 2024-03-10 VITALS — BP 100/58 | HR 88 | Ht 71.0 in | Wt 144.0 lb

## 2024-03-10 DIAGNOSIS — I5020 Unspecified systolic (congestive) heart failure: Secondary | ICD-10-CM | POA: Diagnosis not present

## 2024-03-10 DIAGNOSIS — E785 Hyperlipidemia, unspecified: Secondary | ICD-10-CM | POA: Diagnosis not present

## 2024-03-10 DIAGNOSIS — I2510 Atherosclerotic heart disease of native coronary artery without angina pectoris: Secondary | ICD-10-CM

## 2024-03-10 DIAGNOSIS — J90 Pleural effusion, not elsewhere classified: Secondary | ICD-10-CM | POA: Diagnosis not present

## 2024-03-10 DIAGNOSIS — R002 Palpitations: Secondary | ICD-10-CM

## 2024-03-10 DIAGNOSIS — R0789 Other chest pain: Secondary | ICD-10-CM | POA: Diagnosis not present

## 2024-03-10 DIAGNOSIS — R609 Edema, unspecified: Secondary | ICD-10-CM | POA: Insufficient documentation

## 2024-03-10 DIAGNOSIS — R42 Dizziness and giddiness: Secondary | ICD-10-CM

## 2024-03-10 DIAGNOSIS — I7143 Infrarenal abdominal aortic aneurysm, without rupture: Secondary | ICD-10-CM | POA: Diagnosis not present

## 2024-03-10 LAB — BASIC METABOLIC PANEL WITH GFR
Anion gap: 6 (ref 5–15)
BUN: 16 mg/dL (ref 8–23)
CO2: 32 mmol/L (ref 22–32)
Calcium: 8.9 mg/dL (ref 8.9–10.3)
Chloride: 103 mmol/L (ref 98–111)
Creatinine, Ser: 1.17 mg/dL (ref 0.61–1.24)
GFR, Estimated: 57 mL/min — ABNORMAL LOW
Glucose, Bld: 100 mg/dL — ABNORMAL HIGH (ref 70–99)
Potassium: 3.6 mmol/L (ref 3.5–5.1)
Sodium: 141 mmol/L (ref 135–145)

## 2024-03-10 LAB — TSH: TSH: 6.48 u[IU]/mL — ABNORMAL HIGH (ref 0.350–4.500)

## 2024-03-10 NOTE — Patient Instructions (Addendum)
 Medication Instructions:   Your physician recommends that you continue on your current medications as directed. Please refer to the Current Medication list given to you today.   Labwork:  BNP, BMET,TSH ordered by Dr.Ross  Testing/Procedures: None today  Follow-Up: 3 months  Any Other Special Instructions Will Be Listed Below (If Applicable).  If you need a refill on your cardiac medications before your next appointment, please call your pharmacy.

## 2024-03-13 ENCOUNTER — Other Ambulatory Visit: Payer: Self-pay

## 2024-03-13 DIAGNOSIS — I10 Essential (primary) hypertension: Secondary | ICD-10-CM

## 2024-03-13 DIAGNOSIS — R002 Palpitations: Secondary | ICD-10-CM

## 2024-03-13 DIAGNOSIS — Z79899 Other long term (current) drug therapy: Secondary | ICD-10-CM

## 2024-03-13 DIAGNOSIS — I5020 Unspecified systolic (congestive) heart failure: Secondary | ICD-10-CM

## 2024-03-13 NOTE — Addendum Note (Signed)
 Addended by: CHAUVIGNE, Naoma Boxell on: 03/13/2024 03:53 PM   Modules accepted: Orders

## 2024-03-14 ENCOUNTER — Encounter: Payer: Self-pay | Admitting: Physician Assistant

## 2024-03-14 ENCOUNTER — Telehealth: Payer: Self-pay | Admitting: Physician Assistant

## 2024-03-14 NOTE — Telephone Encounter (Signed)
 Noted

## 2024-03-14 NOTE — Telephone Encounter (Signed)
" °  If swelling, where is the swelling located? Leg down to foot, left hand   How much weight have you gained and in what time span?   Have you gained 2 pounds in a day or 5 pounds in a week? Not sure    Do you have a log of your daily weights (if so, list)?   Are you currently taking a fluid pill? Yes   Are you currently SOB? No   Have you traveled recently in a car or plane for an extended period of time?  Pts daughter Ava calling about her fathers swelling in his left leg and hand. She ask the c/b come to her number. Please advise.  "

## 2024-03-14 NOTE — Telephone Encounter (Signed)
" °  [phone] (Mobile)  Daughter Ava  Wants to discuss continued left hand and leg swelling. "

## 2024-03-17 NOTE — Progress Notes (Signed)
 Last read by Hilbert VEAR Browner at 12:25PM on 03/14/2024.

## 2024-03-27 ENCOUNTER — Ambulatory Visit (HOSPITAL_COMMUNITY)
Admission: RE | Admit: 2024-03-27 | Discharge: 2024-03-27 | Disposition: A | Discharge status: Home / Self Care | Source: Ambulatory Visit | Attending: Family Medicine | Admitting: Family Medicine

## 2024-03-27 ENCOUNTER — Ambulatory Visit: Admitting: Family Medicine

## 2024-03-27 VITALS — BP 98/62 | HR 70 | Temp 99.3°F | Ht 71.0 in

## 2024-03-27 DIAGNOSIS — N1832 Chronic kidney disease, stage 3b: Secondary | ICD-10-CM

## 2024-03-27 DIAGNOSIS — J90 Pleural effusion, not elsewhere classified: Secondary | ICD-10-CM

## 2024-03-27 DIAGNOSIS — I5020 Unspecified systolic (congestive) heart failure: Secondary | ICD-10-CM

## 2024-03-27 NOTE — Progress Notes (Signed)
" ° °  Subjective:    Patient ID: [name], male    DOB: 1926-01-23, 89 y.o.   MRN: [id number]  HPI Patient is in room 10:  Patient is here for a 4 month follow up  Patient stated he is having chest pains on the left side of the chest. He feels it while just sitting   History of pleural effusion Also has history of CHF Under the care of several specialist States the swelling in his legs is not as bad as it has been Taking his medicines on a regular basis Intermittent sharp chest pains on the left side Does relate little more increased shortness of breath when he pushes himself Occasionally feels a sharp pain sitting or moving.  Review of Systems     Objective:   Physical Exam Diminished breath sounds left side in the low base in the mid left lung right side lungs sound clear Oxygen  difficult to get but his hands are cold from being outside Heart regular Pulse normal Blood pressure on the low end of normal       Assessment & Plan:  Severe CHF Continue current medications Blood pressure on the low end of normal For now continue the medications May potentially have to decrease her meds at some point  Left pleural effusion probably related to CHF.  Would benefit from pleurocentesis.  We will work with his pulmonary doctor to get that taken care of Patient to follow-up within 3 to 4 months  Kidney function stable periodically check metabolic 7  Heart failure unfortunately class III class IV unable to increase dose of medications because his blood pressure on the low end of normal continue everything as is "

## 2024-03-28 ENCOUNTER — Ambulatory Visit: Payer: Self-pay | Admitting: Internal Medicine

## 2024-03-28 ENCOUNTER — Other Ambulatory Visit: Payer: Self-pay

## 2024-03-28 ENCOUNTER — Ambulatory Visit: Payer: Self-pay | Admitting: Family Medicine

## 2024-03-28 DIAGNOSIS — J90 Pleural effusion, not elsewhere classified: Secondary | ICD-10-CM

## 2024-03-28 NOTE — Telephone Encounter (Signed)
 Patient scheduled.

## 2024-03-28 NOTE — Telephone Encounter (Signed)
 Acute appt for :  fluids are coming back in his lungs & hes hurting on his left side. Difficulty breathing

## 2024-03-28 NOTE — Telephone Encounter (Signed)
 FYI Only or Action Required?: Action required by provider: clinical question for provider, update on patient condition, and PCP recommended patient follow up with Pulmonology.  Patient is followed in Pulmonology for bilateral pleural effusions, shortness of breath, last seen on 02/22/2024 by Darlean Ozell NOVAK, MD.  Called Nurse Triage reporting Chest Pain.  Symptoms began pt reported to his pcp yesterday.  Interventions attempted: Other: daughter states that niece gave patient a breathing treatment last night.  Symptoms are: gradually worsening.  Triage Disposition: Go to ED Now (Notify PCP)  Patient/caregiver understands and will follow disposition?: No                   Copied from CRM [id number]. Topic: Clinical - Red Word Triage >> Mar 28, 2024  1:10 PM Isabell A wrote: Red Word that prompted transfer to Nurse Triage:   Patients daughter Ava states he went to PCP yesterday & was told the fluids are coming back in his lungs & hes hurting on his left side. Reason for Disposition  Difficulty breathing  Answer Assessment - Initial Assessment Questions Patient's daughter had an appointment with his PCP Dr Alphonsa. PCP advised that patient needed to follow up with his pulmonologist due to fluid on his lung ---per daughter Ava  Daughter states that the patient is having pain in the left side of his chest & he just wasn't feeling good.  Niece gave breathing treatment to patient last night. This morning patient did not report to his daughter any difficulty breathing but he did tell her that he didn't feel good.  He was less active and did have pain on the left lower side of his chest Daughter states that the patient reported the left sided chest pain to his PCP yesterday This RN asked to call the patient at this time and the daughter states that the patient probably wont answer but we can try.  Daughter states that the patient didn't want to go to the ER (he verbalized this  at his PCP appointment)   Daughter [phone]  This RN called patient and he did state that he was having trouble breathing and he felt like he was real sick This RN advised him that I spoke with his daughter and with his symptoms we recommend him going to the Emergency Room. Patient denies wanting to go to the Emergency Room at this time.  Patient is advised to call us  back or his daughter of 911 if he has any further concerns or changes or worsening of symptoms. He verbalized understanding.  This RN called the daughter back and advised her of conversation with the patient (her father) and she was advised that this is going to be sent to the pulmonologist and if anything changes for her to call us  back as well.  She was also advised to call 911 for her father if his symptoms worsened. She verbalized understanding.  Protocols used: Chest Pain-A-AH

## 2024-03-29 ENCOUNTER — Ambulatory Visit (HOSPITAL_COMMUNITY): Admission: RE | Admit: 2024-03-29 | Discharge: 2024-03-29 | Attending: Internal Medicine | Admitting: Internal Medicine

## 2024-03-29 ENCOUNTER — Ambulatory Visit: Admitting: Internal Medicine

## 2024-03-29 ENCOUNTER — Ambulatory Visit: Payer: Self-pay

## 2024-03-29 ENCOUNTER — Ambulatory Visit (HOSPITAL_COMMUNITY)
Admission: RE | Admit: 2024-03-29 | Discharge: 2024-03-29 | Disposition: A | Discharge status: Home / Self Care | Source: Ambulatory Visit | Attending: Radiology | Admitting: Radiology

## 2024-03-29 DIAGNOSIS — J9811 Atelectasis: Secondary | ICD-10-CM | POA: Insufficient documentation

## 2024-03-29 DIAGNOSIS — J90 Pleural effusion, not elsewhere classified: Secondary | ICD-10-CM | POA: Insufficient documentation

## 2024-03-29 MED ORDER — LIDOCAINE HCL (PF) 2 % IJ SOLN: 10.0000 mL | Freq: Once | INTRAMUSCULAR | Status: DC

## 2024-03-29 NOTE — Telephone Encounter (Signed)
" °  FYI Only or Action Required?: FYI only for provider: appointment scheduled on 03/30/2024.  Patient was last seen in primary care on 03/27/2024 by Alphonsa Glendia LABOR, MD.  Called Nurse Triage reporting Pain.  Symptoms began today.  Interventions attempted: Nothing.  Symptoms are: unchanged.  Triage Disposition: See PCP When Office is Open (Within 3 Days)  Patient/caregiver understands and will follow disposition?:  Message from Avram MATSU sent at 03/29/2024  3:18 PM EST  Reason for Triage: daughter stated the is a bruise/rash on lower back, She wanted to let the provider aware. please advise [phone]   Reason for Disposition  Nursing judgment or information in reference  Answer Assessment - Initial Assessment Questions Patient's daughter was notified by radiology to have an area on patient's left low back checked out for rash or bruise. Patient has previously reported pain to this left side as well  Protocols used: No Guideline Available-A-AH  "

## 2024-03-29 NOTE — Procedures (Signed)
 Ultrasound-guided therapeutic left thoracentesis performed yielding 1.8 liters of serosanguinous colored fluid. No immediate complications. Follow-up chest x-ray pending. EBL is < 2 ml.

## 2024-03-30 ENCOUNTER — Emergency Department (HOSPITAL_COMMUNITY)

## 2024-03-30 ENCOUNTER — Other Ambulatory Visit: Payer: Self-pay

## 2024-03-30 ENCOUNTER — Ambulatory Visit: Admitting: Family Medicine

## 2024-03-30 ENCOUNTER — Encounter (HOSPITAL_COMMUNITY): Payer: Self-pay

## 2024-03-30 ENCOUNTER — Inpatient Hospital Stay (HOSPITAL_COMMUNITY)
Admission: EM | Admit: 2024-03-30 | Discharge: 2024-04-07 | DRG: 291 | Disposition: A | Discharge status: Home / Self Care | Attending: Internal Medicine | Admitting: Internal Medicine

## 2024-03-30 DIAGNOSIS — R338 Other retention of urine: Secondary | ICD-10-CM

## 2024-03-30 DIAGNOSIS — I700 Atherosclerosis of aorta: Secondary | ICD-10-CM | POA: Diagnosis present

## 2024-03-30 DIAGNOSIS — R54 Age-related physical debility: Secondary | ICD-10-CM | POA: Diagnosis present

## 2024-03-30 DIAGNOSIS — I10 Essential (primary) hypertension: Secondary | ICD-10-CM | POA: Diagnosis not present

## 2024-03-30 DIAGNOSIS — K219 Gastro-esophageal reflux disease without esophagitis: Secondary | ICD-10-CM | POA: Diagnosis present

## 2024-03-30 DIAGNOSIS — Z7982 Long term (current) use of aspirin: Secondary | ICD-10-CM | POA: Diagnosis not present

## 2024-03-30 DIAGNOSIS — F32A Depression, unspecified: Secondary | ICD-10-CM | POA: Diagnosis present

## 2024-03-30 DIAGNOSIS — I471 Supraventricular tachycardia, unspecified: Secondary | ICD-10-CM | POA: Diagnosis present

## 2024-03-30 DIAGNOSIS — I447 Left bundle-branch block, unspecified: Secondary | ICD-10-CM | POA: Diagnosis present

## 2024-03-30 DIAGNOSIS — R600 Localized edema: Secondary | ICD-10-CM | POA: Diagnosis present

## 2024-03-30 DIAGNOSIS — J45909 Unspecified asthma, uncomplicated: Secondary | ICD-10-CM | POA: Diagnosis present

## 2024-03-30 DIAGNOSIS — H9190 Unspecified hearing loss, unspecified ear: Secondary | ICD-10-CM | POA: Diagnosis present

## 2024-03-30 DIAGNOSIS — R579 Shock, unspecified: Secondary | ICD-10-CM | POA: Diagnosis present

## 2024-03-30 DIAGNOSIS — E872 Acidosis, unspecified: Secondary | ICD-10-CM | POA: Diagnosis present

## 2024-03-30 DIAGNOSIS — Z87891 Personal history of nicotine dependence: Secondary | ICD-10-CM | POA: Diagnosis not present

## 2024-03-30 DIAGNOSIS — B029 Zoster without complications: Secondary | ICD-10-CM | POA: Diagnosis present

## 2024-03-30 DIAGNOSIS — Z515 Encounter for palliative care: Secondary | ICD-10-CM

## 2024-03-30 DIAGNOSIS — Z681 Body mass index (BMI) 19 or less, adult: Secondary | ICD-10-CM

## 2024-03-30 DIAGNOSIS — Z1152 Encounter for screening for COVID-19: Secondary | ICD-10-CM

## 2024-03-30 DIAGNOSIS — N1832 Chronic kidney disease, stage 3b: Secondary | ICD-10-CM | POA: Diagnosis present

## 2024-03-30 DIAGNOSIS — I5020 Unspecified systolic (congestive) heart failure: Secondary | ICD-10-CM | POA: Diagnosis present

## 2024-03-30 DIAGNOSIS — Z96651 Presence of right artificial knee joint: Secondary | ICD-10-CM | POA: Diagnosis present

## 2024-03-30 DIAGNOSIS — Z880 Allergy status to penicillin: Secondary | ICD-10-CM | POA: Diagnosis not present

## 2024-03-30 DIAGNOSIS — J95811 Postprocedural pneumothorax: Principal | ICD-10-CM | POA: Diagnosis present

## 2024-03-30 DIAGNOSIS — Z79899 Other long term (current) drug therapy: Secondary | ICD-10-CM | POA: Diagnosis not present

## 2024-03-30 DIAGNOSIS — R570 Cardiogenic shock: Secondary | ICD-10-CM | POA: Diagnosis present

## 2024-03-30 DIAGNOSIS — I5043 Acute on chronic combined systolic (congestive) and diastolic (congestive) heart failure: Secondary | ICD-10-CM | POA: Diagnosis present

## 2024-03-30 DIAGNOSIS — R571 Hypovolemic shock: Secondary | ICD-10-CM | POA: Diagnosis present

## 2024-03-30 DIAGNOSIS — L8990 Pressure ulcer of unspecified site, unspecified stage: Secondary | ICD-10-CM | POA: Diagnosis present

## 2024-03-30 DIAGNOSIS — Z66 Do not resuscitate: Secondary | ICD-10-CM | POA: Diagnosis present

## 2024-03-30 DIAGNOSIS — R0902 Hypoxemia: Secondary | ICD-10-CM | POA: Diagnosis present

## 2024-03-30 DIAGNOSIS — I110 Hypertensive heart disease with heart failure: Secondary | ICD-10-CM | POA: Diagnosis not present

## 2024-03-30 DIAGNOSIS — R627 Adult failure to thrive: Secondary | ICD-10-CM | POA: Diagnosis present

## 2024-03-30 DIAGNOSIS — E7849 Other hyperlipidemia: Secondary | ICD-10-CM | POA: Diagnosis not present

## 2024-03-30 DIAGNOSIS — M109 Gout, unspecified: Secondary | ICD-10-CM | POA: Diagnosis present

## 2024-03-30 DIAGNOSIS — R339 Retention of urine, unspecified: Secondary | ICD-10-CM | POA: Diagnosis present

## 2024-03-30 DIAGNOSIS — I130 Hypertensive heart and chronic kidney disease with heart failure and stage 1 through stage 4 chronic kidney disease, or unspecified chronic kidney disease: Secondary | ICD-10-CM | POA: Diagnosis present

## 2024-03-30 DIAGNOSIS — E785 Hyperlipidemia, unspecified: Secondary | ICD-10-CM | POA: Diagnosis present

## 2024-03-30 DIAGNOSIS — R601 Generalized edema: Secondary | ICD-10-CM | POA: Diagnosis not present

## 2024-03-30 DIAGNOSIS — Z8249 Family history of ischemic heart disease and other diseases of the circulatory system: Secondary | ICD-10-CM | POA: Diagnosis not present

## 2024-03-30 DIAGNOSIS — J90 Pleural effusion, not elsewhere classified: Secondary | ICD-10-CM | POA: Diagnosis not present

## 2024-03-30 DIAGNOSIS — Z7189 Other specified counseling: Secondary | ICD-10-CM | POA: Diagnosis not present

## 2024-03-30 LAB — CBC WITH DIFFERENTIAL/PLATELET
Abs Immature Granulocytes: 0.03 K/uL (ref 0.00–0.07)
Abs Immature Granulocytes: 0.01 K/uL (ref 0.00–0.07)
Basophils Absolute: 0 K/uL (ref 0.0–0.1)
Basophils Absolute: 0 K/uL (ref 0.0–0.1)
Basophils Relative: 0 %
Basophils Relative: 1 %
Eosinophils Absolute: 0.1 K/uL (ref 0.0–0.5)
Eosinophils Absolute: 0 K/uL (ref 0.0–0.5)
Eosinophils Relative: 3 %
Eosinophils Relative: 0 %
HCT: 36.5 % — ABNORMAL LOW (ref 39.0–52.0)
HCT: 37.5 % — ABNORMAL LOW (ref 39.0–52.0)
Hemoglobin: 10.7 g/dL — ABNORMAL LOW (ref 13.0–17.0)
Hemoglobin: 10.6 g/dL — ABNORMAL LOW (ref 13.0–17.0)
Immature Granulocytes: 1 %
Immature Granulocytes: 0 %
Lymphocytes Relative: 9 %
Lymphocytes Relative: 11 %
Lymphs Abs: 0.5 K/uL — ABNORMAL LOW (ref 0.7–4.0)
Lymphs Abs: 0.5 K/uL — ABNORMAL LOW (ref 0.7–4.0)
MCH: 30.2 pg (ref 26.0–34.0)
MCH: 30.2 pg (ref 26.0–34.0)
MCHC: 29.3 g/dL — ABNORMAL LOW (ref 30.0–36.0)
MCHC: 28.3 g/dL — ABNORMAL LOW (ref 30.0–36.0)
MCV: 103.1 fL — ABNORMAL HIGH (ref 80.0–100.0)
MCV: 106.8 fL — ABNORMAL HIGH (ref 80.0–100.0)
Monocytes Absolute: 0.6 K/uL (ref 0.1–1.0)
Monocytes Absolute: 0.4 K/uL (ref 0.1–1.0)
Monocytes Relative: 10 %
Monocytes Relative: 9 %
Neutro Abs: 4.4 K/uL (ref 1.7–7.7)
Neutro Abs: 4 K/uL (ref 1.7–7.7)
Neutrophils Relative %: 77 %
Neutrophils Relative %: 79 %
Platelets: 160 K/uL (ref 150–400)
Platelets: 141 K/uL — ABNORMAL LOW (ref 150–400)
RBC: 3.54 MIL/uL — ABNORMAL LOW (ref 4.22–5.81)
RBC: 3.51 MIL/uL — ABNORMAL LOW (ref 4.22–5.81)
RDW: 13.3 % (ref 11.5–15.5)
RDW: 13.5 % (ref 11.5–15.5)
WBC: 5.6 K/uL (ref 4.0–10.5)
WBC: 5 K/uL (ref 4.0–10.5)
nRBC: 0 % (ref 0.0–0.2)
nRBC: 0 % (ref 0.0–0.2)

## 2024-03-30 LAB — RESP PANEL BY RT-PCR (RSV, FLU A&B, COVID)  RVPGX2
Influenza A by PCR: NEGATIVE
Influenza B by PCR: NEGATIVE
Resp Syncytial Virus by PCR: NEGATIVE
SARS Coronavirus 2 by RT PCR: NEGATIVE

## 2024-03-30 LAB — URINALYSIS, W/ REFLEX TO CULTURE (INFECTION SUSPECTED)
Bilirubin Urine: NEGATIVE
Glucose, UA: >=500 mg/dL — AB
Ketones, ur: NEGATIVE mg/dL
Leukocytes,Ua: NEGATIVE
Nitrite: NEGATIVE
Protein, ur: 30 mg/dL — AB
Specific Gravity, Urine: 1.01 (ref 1.005–1.030)
pH: 6 (ref 5.0–8.0)

## 2024-03-30 LAB — LACTIC ACID, PLASMA
Lactic Acid, Venous: 3.6 mmol/L (ref 0.5–1.9)
Lactic Acid, Venous: 3 mmol/L (ref 0.5–1.9)
Lactic Acid, Venous: 1.9 mmol/L (ref 0.5–1.9)

## 2024-03-30 LAB — COMPREHENSIVE METABOLIC PANEL WITH GFR
ALT: <5 U/L (ref 0–44)
ALT: <5 U/L (ref 0–44)
AST: 23 U/L (ref 15–41)
AST: 19 U/L (ref 15–41)
Albumin: 3.3 g/dL — ABNORMAL LOW (ref 3.5–5.0)
Albumin: 3.1 g/dL — ABNORMAL LOW (ref 3.5–5.0)
Alkaline Phosphatase: 67 U/L (ref 38–126)
Alkaline Phosphatase: 63 U/L (ref 38–126)
Anion gap: 18 — ABNORMAL HIGH (ref 5–15)
Anion gap: 17 — ABNORMAL HIGH (ref 5–15)
BUN: 18 mg/dL (ref 8–23)
BUN: 18 mg/dL (ref 8–23)
CO2: 21 mmol/L — ABNORMAL LOW (ref 22–32)
CO2: 21 mmol/L — ABNORMAL LOW (ref 22–32)
Calcium: 9.3 mg/dL (ref 8.9–10.3)
Calcium: 8.9 mg/dL (ref 8.9–10.3)
Chloride: 101 mmol/L (ref 98–111)
Chloride: 101 mmol/L (ref 98–111)
Creatinine, Ser: 1.44 mg/dL — ABNORMAL HIGH (ref 0.61–1.24)
Creatinine, Ser: 1.32 mg/dL — ABNORMAL HIGH (ref 0.61–1.24)
GFR, Estimated: 44 mL/min — ABNORMAL LOW
GFR, Estimated: 49 mL/min — ABNORMAL LOW
Glucose, Bld: 99 mg/dL (ref 70–99)
Glucose, Bld: 97 mg/dL (ref 70–99)
Potassium: 3.8 mmol/L (ref 3.5–5.1)
Potassium: 3.4 mmol/L — ABNORMAL LOW (ref 3.5–5.1)
Sodium: 139 mmol/L (ref 135–145)
Sodium: 139 mmol/L (ref 135–145)
Total Bilirubin: 1.9 mg/dL — ABNORMAL HIGH (ref 0.0–1.2)
Total Bilirubin: 1.7 mg/dL — ABNORMAL HIGH (ref 0.0–1.2)
Total Protein: 7.4 g/dL (ref 6.5–8.1)
Total Protein: 6.9 g/dL (ref 6.5–8.1)

## 2024-03-30 LAB — TROPONIN T, HIGH SENSITIVITY
Troponin T High Sensitivity: 94 ng/L — ABNORMAL HIGH (ref 0–19)
Troponin T High Sensitivity: 105 ng/L (ref 0–19)

## 2024-03-30 LAB — PRO BRAIN NATRIURETIC PEPTIDE: Pro Brain Natriuretic Peptide: 6350 pg/mL — ABNORMAL HIGH

## 2024-03-30 LAB — PROTIME-INR
INR: 1.3 — ABNORMAL HIGH (ref 0.8–1.2)
INR: 1.3 — ABNORMAL HIGH (ref 0.8–1.2)
Prothrombin Time: 16.4 s — ABNORMAL HIGH (ref 11.4–15.2)
Prothrombin Time: 17.2 s — ABNORMAL HIGH (ref 11.4–15.2)

## 2024-03-30 LAB — APTT: aPTT: 33 s (ref 24–36)

## 2024-03-30 MED ORDER — METRONIDAZOLE 500 MG/100ML IV SOLN
500.0000 mg | Freq: Once | INTRAVENOUS | Status: AC
  Administered 2024-03-30: 500 mg via INTRAVENOUS
  Filled 2024-03-30: qty 100

## 2024-03-30 MED ORDER — ROSUVASTATIN CALCIUM 20 MG PO TABS
20.0000 mg | ORAL_TABLET | Freq: Every day | ORAL | Status: DC
  Administered 2024-03-30 – 2024-04-07 (×9): 20 mg via ORAL
  Filled 2024-03-30 (×9): qty 1

## 2024-03-30 MED ORDER — MIDODRINE HCL 5 MG PO TABS
10.0000 mg | ORAL_TABLET | Freq: Three times a day (TID) | ORAL | Status: DC
  Administered 2024-03-30 – 2024-04-01 (×6): 10 mg via ORAL
  Filled 2024-03-30 (×6): qty 2

## 2024-03-30 MED ORDER — SODIUM CHLORIDE 0.9 % IV SOLN
2.0000 g | Freq: Once | INTRAVENOUS | Status: AC
  Administered 2024-03-30: 2 g via INTRAVENOUS
  Filled 2024-03-30: qty 12.5

## 2024-03-30 MED ORDER — METRONIDAZOLE 500 MG/100ML IV SOLN: 500.0000 mg | Freq: Two times a day (BID) | INTRAVENOUS | Status: DC

## 2024-03-30 MED ORDER — METRONIDAZOLE 500 MG/100ML IV SOLN
500.0000 mg | Freq: Two times a day (BID) | INTRAVENOUS | Status: DC
  Filled 2024-03-30: qty 100

## 2024-03-30 MED ORDER — TAMSULOSIN HCL 0.4 MG PO CAPS
0.4000 mg | ORAL_CAPSULE | Freq: Every day | ORAL | Status: DC
  Administered 2024-03-30 – 2024-04-06 (×8): 0.4 mg via ORAL
  Filled 2024-03-30 (×8): qty 1

## 2024-03-30 MED ORDER — VALACYCLOVIR HCL 500 MG PO TABS
500.0000 mg | ORAL_TABLET | Freq: Two times a day (BID) | ORAL | Status: DC
  Administered 2024-03-30 – 2024-04-03 (×8): 500 mg via ORAL
  Filled 2024-03-30 (×11): qty 1

## 2024-03-30 MED ORDER — ACETAMINOPHEN 650 MG RE SUPP: 650.0000 mg | Freq: Four times a day (QID) | RECTAL | Status: DC | PRN

## 2024-03-30 MED ORDER — HEPARIN SODIUM (PORCINE) 5000 UNIT/ML IJ SOLN
5000.0000 [IU] | Freq: Three times a day (TID) | INTRAMUSCULAR | Status: DC
  Administered 2024-03-31 – 2024-04-07 (×22): 5000 [IU] via SUBCUTANEOUS
  Filled 2024-03-30 (×22): qty 1

## 2024-03-30 MED ORDER — LACTATED RINGERS IV SOLN
150.0000 mL/h | INTRAVENOUS | Status: DC
  Administered 2024-03-30 – 2024-03-31 (×2): 150 mL/h via INTRAVENOUS

## 2024-03-30 MED ORDER — VANCOMYCIN HCL 1250 MG/250ML IV SOLN
1250.0000 mg | Freq: Once | INTRAVENOUS | Status: AC
  Administered 2024-03-30: 1250 mg via INTRAVENOUS
  Filled 2024-03-30: qty 250

## 2024-03-30 MED ORDER — SODIUM CHLORIDE 0.9 % IV BOLUS
500.0000 mL | Freq: Once | INTRAVENOUS | Status: AC
  Administered 2024-03-30: 500 mL via INTRAVENOUS

## 2024-03-30 MED ORDER — VANCOMYCIN HCL 750 MG/150ML IV SOLN
750.0000 mg | INTRAVENOUS | Status: DC
  Administered 2024-03-31: 750 mg via INTRAVENOUS
  Filled 2024-03-30 (×2): qty 150

## 2024-03-30 MED ORDER — VANCOMYCIN HCL IN DEXTROSE 1-5 GM/200ML-% IV SOLN: 1000.0000 mg | Freq: Once | INTRAVENOUS | Status: DC

## 2024-03-30 MED ORDER — ASPIRIN 81 MG PO CHEW
81.0000 mg | CHEWABLE_TABLET | Freq: Every day | ORAL | Status: DC
  Administered 2024-03-30 – 2024-04-07 (×9): 81 mg via ORAL
  Filled 2024-03-30 (×9): qty 1

## 2024-03-30 MED ORDER — ACETAMINOPHEN 325 MG PO TABS
650.0000 mg | ORAL_TABLET | Freq: Four times a day (QID) | ORAL | Status: DC | PRN
  Administered 2024-04-01 – 2024-04-02 (×3): 650 mg via ORAL
  Filled 2024-03-30 (×3): qty 2

## 2024-03-30 MED ORDER — HYDROCODONE-ACETAMINOPHEN 5-325 MG PO TABS
1.0000 | ORAL_TABLET | ORAL | Status: DC | PRN
  Administered 2024-04-01: 1 via ORAL
  Administered 2024-04-04 – 2024-04-05 (×2): 2 via ORAL
  Filled 2024-03-30: qty 2
  Filled 2024-03-30: qty 1
  Filled 2024-03-30: qty 2

## 2024-03-30 MED ORDER — SODIUM CHLORIDE 0.9 % IV SOLN
2.0000 g | INTRAVENOUS | Status: DC
  Administered 2024-03-31: 2 g via INTRAVENOUS
  Filled 2024-03-30: qty 12.5

## 2024-03-30 NOTE — ED Notes (Signed)
 ED Provider at bedside.

## 2024-03-30 NOTE — ED Triage Notes (Signed)
 Pt arrives from home via [facility] where he lives at home alone. Daughter called EMS for shortness of breath. EMS reports pt was tripoding upon arrival and placed him on 3L [place]. Pt had 'fluid drained off of lungs yesterday outpatient' per EMS. HR 120s, 97.7 temp, BP of 86/61. Decreased breath sounds per EMS. Pt A&OX4

## 2024-03-30 NOTE — Telephone Encounter (Signed)
 Patient has an appointment today 03/30/2024

## 2024-03-30 NOTE — H&P (Signed)
 "                                                                                                          [facility] H&P   Patient Demographics:    [name], is a 89 y.o. male  MRN: [id number]   DOB - 08/18/1925  Admit Date - 03/30/2024  Outpatient Primary MD for the patient is Alphonsa Glendia LABOR, MD    Chief Complaint  Patient presents with   Shortness of Breath      HPI:    [name]  is a 89 y.o. male, with a history of asthma, hypertension, gouty arthritis, and hyperlipidemia , systolic CHF with EF 20 to 25%, recurrent right pleural effusion due to CHF. - Patient presents to ED secondary to shortness of breath and weakness, as well as inability to urinate, send with temperature effusion, hypoxia placed on yesterday, reports this morning still with significant dyspnea, he denies any fever, chills, reports his chronic cough unchanged. - in ED  his workup was significant for small pleural effusion, pulmonary elevated at 94, lactic acid of 3.6, creatinine at baseline 1.44, to have retention, Foley catheter inserted with 600 cc of immediate urine output, was hypotensive 76/59, he responded to fluid bolus, chest x-ray significant for mild left pneumothorax left lung base opacity, [facility] consulted to admit.    Review of systems:      A full 10 point Review of Systems was done, except as stated above, all other Review of Systems were negative.   With Past History of the following :    Past Medical History:  Diagnosis Date   Asthma    Diverticulosis    GERD (gastroesophageal reflux disease)    Gout    Heart murmur    Evaluation by a cardiologist years ago was reportedly negative   HTN (hypertension)    Osteoarthritis    Reflux       Past Surgical History:  Procedure Laterality Date   APPENDECTOMY  2007   COLONOSCOPY  2003   COLONOSCOPY N/A 12/28/2012   Procedure: COLONOSCOPY;  Surgeon: Claudis RAYMOND Rivet, MD;  Location: [facility];  Service: Endoscopy;   Laterality: N/A;  255-rescheduled to 10:30 Ann notified pt   EYE SURGERY Right    cataract removal   INGUINAL HERNIA REPAIR     Right   JOINT REPLACEMENT     right total knee RIGHT total knee arthroplasty   TOTAL KNEE ARTHROPLASTY  09/2009   Right      Social History:     Social History   Tobacco Use   Smoking status: Former    Types: Pipe    Start date: 12/04/1939    Quit date: 1970    Years since quitting: 56.0   Smokeless tobacco: Never   Tobacco comments:    quit smoking cigarettes in 1971  Substance Use Topics   Alcohol use: No        Family History :     Family History  Problem Relation Age of Onset  Hypertension Mother    Hypertension Father    Heart attack Father    Cancer Brother        colon   Arthritis Other    Heart disease Other        Male < 55   Uterine cancer Maternal Aunt       Home Medications:   Prior to Admission medications  Medication Sig Start Date End Date Taking? Authorizing Provider  acetaminophen  (TYLENOL ) 650 MG CR tablet Take 650-1,300 mg by mouth every 8 (eight) hours as needed for pain.    [provider]  albuterol  (VENTOLIN  HFA) 108 (90 Base) MCG/ACT inhaler Inhale 2 puffs into the lungs every 4 (four) hours as needed for wheezing or shortness of breath. 12/21/23   Alphonsa Glendia LABOR, MD  aspirin  81 MG chewable tablet Chew 1 tablet (81 mg total) by mouth daily. 03/18/22   Evonnie Lenis, MD  dapagliflozin  propanediol (FARXIGA ) 5 MG TABS tablet Take 1 tablet (5 mg total) by mouth daily before breakfast. 09/27/23   Alphonsa Glendia LABOR, MD  furosemide  (LASIX ) 20 MG tablet Take 1 tablet (20 mg total) by mouth daily. 01/31/24   Okey Vina GAILS, MD  losartan  (COZAAR ) 25 MG tablet TAKE ONE TABLET BY MOUTH EVERY DAY 10/26/23   Alphonsa Glendia LABOR, MD  mirtazapine  (REMERON ) 7.5 MG tablet Take 1 tablet (7.5 mg total) by mouth at bedtime. 12/30/23   Cook, Jayce G, DO  mupirocin  ointment (BACTROBAN ) 2 % APPLY TWICE DAILY. 01/10/24   Alphonsa Glendia LABOR,  MD  potassium chloride  (KLOR-CON  M) 10 MEQ tablet Take 10 mEq by mouth daily.    [provider]  predniSONE  (DELTASONE ) 5 MG tablet 2 every day for 3 days then 1 every day for 3 days 03/01/24   Alphonsa Glendia LABOR, MD  rosuvastatin  (CRESTOR ) 20 MG tablet TAKE ONE TABLET BY MOUTH EVERY DAY 04/23/23   Alphonsa Glendia LABOR, MD  spironolactone  (ALDACTONE ) 25 MG tablet TAKE 1/2 TABLET BY MOUTH EVERY DAY 12/13/23   Alphonsa Glendia LABOR, MD     Allergies:    Allergies[1]   Physical Exam:   Vitals  Blood pressure (!) 123/58, pulse 76, temperature 97.9 F (36.6 C), temperature source Oral, resp. rate 20, height 5' 11 (1.803 m), weight 65.8 kg, SpO2 95%.   1. General Frail elderly male laying in bed in no apparent distress  2. Not Suicidal or Homicidal, Awake Alert, Oriented X 3.  Confused with some impaired judgment and insight  3. No F.N deficits, ALL C.Nerves Intact, Strength 5/5 all 4 extremities, Sensation intact all 4 extremities, Plantars down going.  4. Ears and Eyes appear Normal, Conjunctivae clear, PERRLA. Moist Oral Mucosa.  5. Supple Neck, + JVD, No cervical lymphadenopathy appriciated, No Carotid Bruits.  6. Symmetrical Chest wall movement, chest air entry at the lungs right> left with crackles at the bases  7. RRR, No  Murmurs, No Parasternal Heave, +2 edema  8. Positive Bowel Sounds, Abdomen Soft, No tenderness, No organomegaly appriciated,No rebound -guarding or rigidity.  9.  No Cyanosis, Normal Skin Turgor, No Skin Rash or Bruise.  10. Good muscle tone,  joints appear normal , no effusions, Normal ROM.    Data Review:    CBC Recent Labs  Lab 03/30/24 1237  WBC 5.6  HGB 10.7*  HCT 36.5*  PLT 160  MCV 103.1*  MCH 30.2  MCHC 29.3*  RDW 13.3  LYMPHSABS 0.5*  MONOABS 0.6  EOSABS 0.1  BASOSABS 0.0   ------------------------------------------------------------------------------------------------------------------  Chemistries  Recent Labs  Lab 03/30/24 1237   NA 139  K 3.8  CL 101  CO2 21*  GLUCOSE 99  BUN 18  CREATININE 1.44*  CALCIUM  9.3  AST 23  ALT <5  ALKPHOS 67  BILITOT 1.9*   ------------------------------------------------------------------------------------------------------------------ estimated creatinine clearance is 27.9 mL/min (A) (by C-G formula based on SCr of 1.44 mg/dL (H)). ------------------------------------------------------------------------------------------------------------------ No results for input(s): TSH, T4TOTAL, T3FREE, THYROIDAB in the last 72 hours.  Invalid input(s): FREET3  Coagulation profile Recent Labs  Lab 03/30/24 1300  INR 1.3*   ------------------------------------------------------------------------------------------------------------------- No results for input(s): DDIMER in the last 72 hours. -------------------------------------------------------------------------------------------------------------------  Cardiac Enzymes No results for input(s): CKMB, TROPONINI, MYOGLOBIN in the last 168 hours.  Invalid input(s): CK ------------------------------------------------------------------------------------------------------------------    Component Value Date/Time   BNP 299.0 (H) 10/12/2023 1305     ---------------------------------------------------------------------------------------------------------------  Urinalysis    Component Value Date/Time   COLORURINE YELLOW 03/30/2024 1357   APPEARANCEUR CLEAR 03/30/2024 1357   LABSPEC 1.010 03/30/2024 1357   PHURINE 6.0 03/30/2024 1357   GLUCOSEU >=500 (A) 03/30/2024 1357   HGBUR SMALL (A) 03/30/2024 1357   BILIRUBINUR NEGATIVE 03/30/2024 1357   BILIRUBINUR moderate (A) 10/14/2022 1119   KETONESUR NEGATIVE 03/30/2024 1357   PROTEINUR 30 (A) 03/30/2024 1357   UROBILINOGEN 0.2 10/14/2022 1119   NITRITE NEGATIVE 03/30/2024 1357   LEUKOCYTESUR NEGATIVE 03/30/2024 1357     ----------------------------------------------------------------------------------------------------------------   Imaging Results:    DG Chest Portable 1 View Result Date: 03/30/2024 EXAM: 1 VIEW(S) XRAY OF THE CHEST 03/30/2024 11:59:53 AM COMPARISON: 03/29/2024 CLINICAL HISTORY: dyspnea dyspnea dyspnea FINDINGS: LUNGS AND PLEURA: Small left pneumothorax. Elevated left hemidiaphragm. Patchy opacity left lung base with associated pleural effusion. HEART AND MEDIASTINUM: Cardiomegaly, unchanged. Calcified aorta. BONES AND SOFT TISSUES: No acute osseous abnormality. IMPRESSION: 1. Small left pneumothorax. 2. Patchy opacity at the left lung base with associated pleural effusion and elevated left hemidiaphragm. 3. Cardiomegaly, unchanged. Calcified aorta. Electronically signed by: Evalene Coho MD 03/30/2024 12:17 PM EST RP Workstation: [id number]   US  THORACENTESIS LEFT ASP PLEURAL SPACE W/IMG GUIDE Result Date: 03/29/2024 INDICATION: 89 year old male. History of CHF with recurrent left-sided pleural effusion. Request is for therapeutic thoracentesis. EXAM: ULTRASOUND GUIDED THERAPEUTIC LEFT-SIDED THORACENTESIS MEDICATIONS: Lidocaine  1% 10 mL COMPLICATIONS: None immediate. PROCEDURE: An ultrasound guided thoracentesis was thoroughly discussed with the patient and questions answered. The benefits, risks, alternatives and complications were also discussed. The patient understands and wishes to proceed with the procedure. Written consent was obtained. Ultrasound was performed to localize and mark an adequate pocket of fluid in the left chest. The area was then prepped and draped in the normal sterile fashion. 1% Lidocaine  was used for local anesthesia. Under ultrasound guidance a 6 Fr Safe-T-Centesis catheter was introduced. Thoracentesis was performed. The catheter was removed and a dressing applied. FINDINGS: A total of approximately 1.5 L of serosanguineous fluid was removed. IMPRESSION: Successful  ultrasound guided therapeutic left-sided thoracentesis yielding 1.8 of serosanguineous pleural fluid. Performed by Delon Beagle NP Electronically Signed   By: Ester Sides M.D.   On: 03/29/2024 14:43   DG Chest Port 1 View Result Date: 03/29/2024 EXAM: 1 VIEW(S) XRAY OF THE CHEST 03/29/2024 01:45:42 PM COMPARISON: 03/27/2024 CLINICAL HISTORY: S/P thoracentesis. FINDINGS: LUNGS AND PLEURA: Decreased left pleural effusion status post thoracentesis. No significant pneumothorax identified. Persistent left lung base atelectasis. Trace right pleural effusion. Decreased interstitial edema. No focal pulmonary opacity. HEART AND MEDIASTINUM: Cardiomegaly, stable. BONES AND SOFT TISSUES: No acute osseous abnormality. IMPRESSION: 1.  Decreased left pleural effusion status post thoracentesis. No significant pneumothorax identified. 2. Persistent left lung base atelectasis. 3. Trace right pleural effusion. Electronically signed by: Waddell Calk MD 03/29/2024 02:28 PM EST RP Workstation: [id number]    My personal review of EKG: Rhythm at 91 bpm, LBBB at baseline     Assessment & Plan:    Principal Problem:   Shock (HCC) Active Problems:   LBBB (left bundle branch block)   Gout   HTN (hypertension)   Hyperlipidemia   Peripheral edema   HFrEF (heart failure with reduced ejection fraction) (HCC)   Shock Septic versus cardiogenic -Hypotensive, elevated lactic acid, tachycardic - partial response to fluid, but for resuscitation is very limited due to heart failure - Continue with broad-spectrum antibiotic coverage vancomycin , cefepime  and Flagyl , pending further workup for infectious etiology, but so far is negative, chest x-ray with possible pneumonia, blood cultures are pending. - Can be related to cardiogenic shock, with known EF 20 to 25%, I have discussed this with daughter, please see discussion under goals of care, currently we are limited, and advanced age, gentle hydration will monitor  closely, will start on midodrine  for hypotension, added advanced therapies. - Hold antihypertensive medications  Shingles - Left chest area, please see pictures, will start on Valtrex   Urinary retention  - Foley catheter inserted, will start on Flomax    CKD stage IIIb - Creatinine at baseline  SVT - Patient had SVT while in the ED, converted to NSR without intervention  Acute on chronic diastolic CHF>> with cardiogenic shock - EF 20 to 25% - Hold GDMT medications including Aldactone , Farxiga  and losartan  given low blood pressure  Recurrent pleural effusion Pneumothorax - S/p thoracentesis yesterday 1.8 L drained, this is most likely in the setting of CHF, no labs were sent on that effusion. - Chest x-ray with small pneumothorax, ED discussed with Dr. Annella, who recommended to start on oxygen  and repeat x-ray in a.m.  Failure to thrive - Continue with mirtazapine  - Will start on supplements   Goals of care  -Discussed with daughter, is an [employer], will continue with current measures with no escalation of care, will continue with antibiotics, follow septic workup, regarding low EF she understands he is not a candidate for advanced therapies, we will hold his antihypertensive medications, started on midodrine , with no escalation of care, no central lines, if he decompensates/deteriorates despite that then plan to proceed with full comfort measures, palliative medicine consulted  DVT Prophylaxis Heparin    AM Labs Ordered, also please review Full Orders  Family Communication: Admission, patients condition and plan of care including tests being ordered have been discussed with the patient and daughter [place] at bedside who indicate understanding and agree with the plan and Code Status.  Code Status DNR/no escalation of care  Likely DC to home  Consults called: Palliative medicine requested  Admission status: Inpatient  Time spent in minutes : 65 minutes  The patient is  critically ill with multi-organ failure.  Critical care was necessary to treat or prevent imminent or life-threatening deterioration of sepsis, respiratory failure, cardiac failure, renal failure and was exclusive of separately billable procedures and treating other patients. Total critical care time spent by me: 65 minutes Time spent personally by me on obtaining history from patient or surrogate, evaluation of the patient, evaluation of patient's response to treatment, ordering and review of laboratory studies, ordering and review of radiographic studies, ordering and performing treatments and interventions, and re-evaluation of the patient's condition.  Brayton Lye M.D on 03/30/2024 at 4:34 PM   [facility] - Office  [phone]        [1]  Allergies Allergen Reactions   Penicillin G Benzathine     Other reaction(s): Unknown   Penicillins Rash   "

## 2024-03-30 NOTE — Progress Notes (Signed)
 Elink following for sepsis protocol.

## 2024-03-30 NOTE — Progress Notes (Signed)
 Pharmacy Antibiotic Note  [name] is a 89 y.o. male admitted on 03/30/2024 with sepsis.  Pharmacy has been consulted for cefepime  and vancomycin  dosing.  Patient currently afebrile, wbc normal. Scr slightly elevated at 1.44 which appears to be close to baseline.   Vancomycin  750 mg IV Q 24 hrs. Goal AUC 400-550. Expected AUC: 510 SCr used: 1.44.  Cefepime  2g q24 hours   Height: 5' 11 (180.3 cm) Weight: 65.8 kg (145 lb) IBW/kg (Calculated) : 75.3  Temp (24hrs), Avg:97.9 F (36.6 C), Min:97.8 F (36.6 C), Max:97.9 F (36.6 C)  Recent Labs  Lab 03/30/24 1237 03/30/24 1300  WBC 5.6  --   CREATININE 1.44*  --   LATICACIDVEN  --  3.6*    Estimated Creatinine Clearance: 27.9 mL/min (A) (by C-G formula based on SCr of 1.44 mg/dL (H)).    Allergies[1] Thank you for allowing pharmacy to be a part of this patients care.  Dempsey Blush PharmD., BCPS Clinical Pharmacist 03/30/2024 4:35 PM     [1]  Allergies Allergen Reactions   Penicillin G Benzathine     Other reaction(s): Unknown   Penicillins Rash

## 2024-03-30 NOTE — ED Provider Notes (Signed)
 " [place] EMERGENCY DEPARTMENT AT [facility] Provider Note   CSN: [id number] Arrival date & time: 03/30/24  1118     Patient presents with: Shortness of Breath   [name] is a 89 y.o. male.   HPI 89 year old male presents with shortness of breath and inability to urinate.  He urinated when he first woke up but then took his fluid pill and then has not been able to urinate since.  He has chronic dyspnea and has pleural effusion but received a thoracentesis yesterday.  This morning his shortness of breath is worse.  His chronic cough is unchanged.  He has not had a fever.  He had some transient chest pain but that is resolved.  No fevers at home.  EMS reported he was tripoding on their arrival but his respiratory status seems to be better.  Prior to Admission medications  Medication Sig Start Date End Date Taking? Authorizing Provider  acetaminophen  (TYLENOL ) 650 MG CR tablet Take 650-1,300 mg by mouth every 8 (eight) hours as needed for pain.    [provider]  albuterol  (VENTOLIN  HFA) 108 (90 Base) MCG/ACT inhaler Inhale 2 puffs into the lungs every 4 (four) hours as needed for wheezing or shortness of breath. 12/21/23   Alphonsa Glendia LABOR, MD  aspirin  81 MG chewable tablet Chew 1 tablet (81 mg total) by mouth daily. 03/18/22   Evonnie Lenis, MD  dapagliflozin  propanediol (FARXIGA ) 5 MG TABS tablet Take 1 tablet (5 mg total) by mouth daily before breakfast. 09/27/23   Alphonsa Glendia LABOR, MD  furosemide  (LASIX ) 20 MG tablet Take 1 tablet (20 mg total) by mouth daily. 01/31/24   Okey Vina GAILS, MD  losartan  (COZAAR ) 25 MG tablet TAKE ONE TABLET BY MOUTH EVERY DAY 10/26/23   Alphonsa Glendia LABOR, MD  mirtazapine  (REMERON ) 7.5 MG tablet Take 1 tablet (7.5 mg total) by mouth at bedtime. 12/30/23   Cook, Jayce G, DO  mupirocin  ointment (BACTROBAN ) 2 % APPLY TWICE DAILY. 01/10/24   Alphonsa Glendia LABOR, MD  potassium chloride  (KLOR-CON  M) 10 MEQ tablet Take 10 mEq by mouth daily.     [provider]  predniSONE  (DELTASONE ) 5 MG tablet 2 every day for 3 days then 1 every day for 3 days 03/01/24   Alphonsa Glendia LABOR, MD  rosuvastatin  (CRESTOR ) 20 MG tablet TAKE ONE TABLET BY MOUTH EVERY DAY 04/23/23   Alphonsa Glendia LABOR, MD  spironolactone  (ALDACTONE ) 25 MG tablet TAKE 1/2 TABLET BY MOUTH EVERY DAY 12/13/23   Alphonsa Glendia LABOR, MD    Allergies: Penicillin g benzathine and Penicillins    Review of Systems  Constitutional:  Negative for fever.  Respiratory:  Positive for cough and shortness of breath.   Cardiovascular:  Positive for chest pain (earlier, gone now).  Gastrointestinal:  Negative for abdominal pain.  Genitourinary:  Positive for difficulty urinating.    Updated Vital Signs BP (!) 89/64   Pulse 82   Temp 97.8 F (36.6 C) (Oral)   Resp 16   Ht 5' 11 (1.803 m)   Wt 65.8 kg   SpO2 98%   BMI 20.22 kg/m   Physical Exam Vitals and nursing note reviewed.  Constitutional:      General: He is not in acute distress.    Appearance: He is well-developed. He is not ill-appearing or diaphoretic.  HENT:     Head: Normocephalic and atraumatic.  Cardiovascular:     Rate and Rhythm: Regular rhythm. Tachycardia present.  Heart sounds: Normal heart sounds.  Pulmonary:     Effort: Pulmonary effort is normal. No tachypnea or accessory muscle usage.     Breath sounds: Examination of the left-lower field reveals decreased breath sounds. Decreased breath sounds present.  Abdominal:     Palpations: Abdomen is soft.     Tenderness: There is no abdominal tenderness.  Skin:    General: Skin is warm and dry.  Neurological:     Mental Status: He is alert.     (all labs ordered are listed, but only abnormal results are displayed) Labs Reviewed  LACTIC ACID, PLASMA - Abnormal; Notable for the following components:      Result Value   Lactic Acid, Venous 3.6 (*)    All other components within normal limits  PROTIME-INR - Abnormal; Notable for the following  components:   Prothrombin Time 16.4 (*)    INR 1.3 (*)    All other components within normal limits  CBC WITH DIFFERENTIAL/PLATELET - Abnormal; Notable for the following components:   RBC 3.54 (*)    Hemoglobin 10.7 (*)    HCT 36.5 (*)    MCV 103.1 (*)    MCHC 29.3 (*)    Lymphs Abs 0.5 (*)    All other components within normal limits  COMPREHENSIVE METABOLIC PANEL WITH GFR - Abnormal; Notable for the following components:   CO2 21 (*)    Creatinine, Ser 1.44 (*)    Albumin 3.3 (*)    Total Bilirubin 1.9 (*)    GFR, Estimated 44 (*)    Anion gap 18 (*)    All other components within normal limits  TROPONIN T, HIGH SENSITIVITY - Abnormal; Notable for the following components:   Troponin T High Sensitivity 94 (*)    All other components within normal limits  RESP PANEL BY RT-PCR (RSV, FLU A&B, COVID)  RVPGX2  CULTURE, BLOOD (ROUTINE X 2)  CULTURE, BLOOD (ROUTINE X 2)  LACTIC ACID, PLASMA  CBC WITH DIFFERENTIAL/PLATELET  URINALYSIS, W/ REFLEX TO CULTURE (INFECTION SUSPECTED)  TROPONIN T, HIGH SENSITIVITY    EKG: EKG Interpretation Date/Time:  Thursday March 30 2024 12:32:35 EST Ventricular Rate:  91 PR Interval:  124 QRS Duration:  158 QT Interval:  427 QTC Calculation: 526 R Axis:   12  Text Interpretation: Sinus rhythm Atrial premature complexes Left bundle branch block HR improved from earlier in the day Confirmed by Freddi Hamilton [phone]) on 03/30/2024 1:26:40 PM  Radiology: [facility] Chest Portable 1 View Result Date: 03/30/2024 EXAM: 1 VIEW(S) XRAY OF THE CHEST 03/30/2024 11:59:53 AM COMPARISON: 03/29/2024 CLINICAL HISTORY: dyspnea dyspnea dyspnea FINDINGS: LUNGS AND PLEURA: Small left pneumothorax. Elevated left hemidiaphragm. Patchy opacity left lung base with associated pleural effusion. HEART AND MEDIASTINUM: Cardiomegaly, unchanged. Calcified aorta. BONES AND SOFT TISSUES: No acute osseous abnormality. IMPRESSION: 1. Small left pneumothorax. 2. Patchy opacity at the  left lung base with associated pleural effusion and elevated left hemidiaphragm. 3. Cardiomegaly, unchanged. Calcified aorta. Electronically signed by: Evalene Coho MD 03/30/2024 12:17 PM EST RP Workstation: [id number]   US  THORACENTESIS LEFT ASP PLEURAL SPACE W/IMG GUIDE Result Date: 03/29/2024 INDICATION: 89 year old male. History of CHF with recurrent left-sided pleural effusion. Request is for therapeutic thoracentesis. EXAM: ULTRASOUND GUIDED THERAPEUTIC LEFT-SIDED THORACENTESIS MEDICATIONS: Lidocaine  1% 10 mL COMPLICATIONS: None immediate. PROCEDURE: An ultrasound guided thoracentesis was thoroughly discussed with the patient and questions answered. The benefits, risks, alternatives and complications were also discussed. The patient understands and wishes to proceed with the procedure. Written consent  was obtained. Ultrasound was performed to localize and mark an adequate pocket of fluid in the left chest. The area was then prepped and draped in the normal sterile fashion. 1% Lidocaine  was used for local anesthesia. Under ultrasound guidance a 6 Fr Safe-T-Centesis catheter was introduced. Thoracentesis was performed. The catheter was removed and a dressing applied. FINDINGS: A total of approximately 1.5 L of serosanguineous fluid was removed. IMPRESSION: Successful ultrasound guided therapeutic left-sided thoracentesis yielding 1.8 of serosanguineous pleural fluid. Performed by Delon Beagle NP Electronically Signed   By: Ester Sides M.D.   On: 03/29/2024 14:43   DG Chest Port 1 View Result Date: 03/29/2024 EXAM: 1 VIEW(S) XRAY OF THE CHEST 03/29/2024 01:45:42 PM COMPARISON: 03/27/2024 CLINICAL HISTORY: S/P thoracentesis. FINDINGS: LUNGS AND PLEURA: Decreased left pleural effusion status post thoracentesis. No significant pneumothorax identified. Persistent left lung base atelectasis. Trace right pleural effusion. Decreased interstitial edema. No focal pulmonary opacity. HEART AND MEDIASTINUM:  Cardiomegaly, stable. BONES AND SOFT TISSUES: No acute osseous abnormality. IMPRESSION: 1. Decreased left pleural effusion status post thoracentesis. No significant pneumothorax identified. 2. Persistent left lung base atelectasis. 3. Trace right pleural effusion. Electronically signed by: Waddell Calk MD 03/29/2024 02:28 PM EST RP Workstation: [id number]     .Critical Care  Performed by: Freddi Hamilton, MD Authorized by: Freddi Hamilton, MD   Critical care provider statement:    Critical care time (minutes):  40   Critical care time was exclusive of:  Separately billable procedures and treating other patients   Critical care was necessary to treat or prevent imminent or life-threatening deterioration of the following conditions:  Respiratory failure and circulatory failure   Critical care was time spent personally by me on the following activities:  Development of treatment plan with patient or surrogate, discussions with consultants, evaluation of patient's response to treatment, examination of patient, ordering and review of laboratory studies, ordering and review of radiographic studies, ordering and performing treatments and interventions, pulse oximetry, re-evaluation of patient's condition and review of old charts    Medications Ordered in the ED  ceFEPIme  (MAXIPIME ) 2 g in sodium chloride  0.9 % 100 mL IVPB (0 g Intravenous Stopped 03/30/24 1327)  metroNIDAZOLE  (FLAGYL ) IVPB 500 mg (0 mg Intravenous Stopped 03/30/24 1359)  vancomycin  (VANCOREADY) IVPB 1250 mg/250 mL (0 mg Intravenous Stopped 03/30/24 1415)  sodium chloride  0.9 % bolus 500 mL (0 mLs Intravenous Stopped 03/30/24 1352)  sodium chloride  0.9 % bolus 500 mL (500 mLs Intravenous New Bag/Given 03/30/24 1436)                                    Medical Decision Making Amount and/or Complexity of Data Reviewed Independent Historian:     Details: Family Labs: ordered.    Details: Mild bump in creatinine.  Elevated  lactate Radiology: ordered and independent interpretation performed.    Details: Small left-sided pneumothorax ECG/medicine tests: ordered and independent interpretation performed.    Details: Originally tachycardia without P waves, later resolved into sinus rhythm.  Risk Prescription drug management. Decision regarding hospitalization.   Patient presents with shortness of breath and inability to urinate.  He did urinate a little bit but still had over 400 cc after urinating so Foley catheter will be placed.  He has low blood pressures, originally in the 70s but then bounced up into the 90s and low 100s.  The bounced up and pressure occurred around the same time his  heart rate suddenly went from 130s to 80s.  I suspect his original tachycardia was an atrial arrhythmia.  He is resting comfortably and while EMS reported he was in respiratory distress he has been breathing without significant difficulty on only 2 L here.  He does have what appears to be a small pneumothorax.  I discussed with pulmonology/critical care, Dr. Annella, who feels this is most likely ex-vacuo pneumothorax and is small enough that a chest tube does not seem needed.  Can be watched on supplemental oxygen  and get a repeat chest x-ray in the morning.  Certainly does not seem like the cause for his hypotension.  He was broadly started on possible sepsis treatment with antibiotics.  However further review shows that he does tend to have relatively soft blood pressures and this may be more closer to his normal.  Consulted hospitalist for admission, pending at this time (Care to Dr. Patsey).     Final diagnoses:  Postprocedural pneumothorax  Acute urinary retention    ED Discharge Orders     None          Freddi Hamilton, MD 03/30/24 1547  "

## 2024-03-31 ENCOUNTER — Inpatient Hospital Stay (HOSPITAL_COMMUNITY)

## 2024-03-31 DIAGNOSIS — I10 Essential (primary) hypertension: Secondary | ICD-10-CM | POA: Diagnosis not present

## 2024-03-31 DIAGNOSIS — J90 Pleural effusion, not elsewhere classified: Secondary | ICD-10-CM | POA: Diagnosis not present

## 2024-03-31 DIAGNOSIS — I5020 Unspecified systolic (congestive) heart failure: Secondary | ICD-10-CM

## 2024-03-31 DIAGNOSIS — R579 Shock, unspecified: Secondary | ICD-10-CM | POA: Diagnosis not present

## 2024-03-31 DIAGNOSIS — E7849 Other hyperlipidemia: Secondary | ICD-10-CM | POA: Diagnosis not present

## 2024-03-31 DIAGNOSIS — B029 Zoster without complications: Secondary | ICD-10-CM | POA: Diagnosis not present

## 2024-03-31 LAB — CBC
HCT: 35.9 % — ABNORMAL LOW (ref 39.0–52.0)
Hemoglobin: 10.8 g/dL — ABNORMAL LOW (ref 13.0–17.0)
MCH: 30.7 pg (ref 26.0–34.0)
MCHC: 30.1 g/dL (ref 30.0–36.0)
MCV: 102 fL — ABNORMAL HIGH (ref 80.0–100.0)
Platelets: 156 K/uL (ref 150–400)
RBC: 3.52 MIL/uL — ABNORMAL LOW (ref 4.22–5.81)
RDW: 13.3 % (ref 11.5–15.5)
WBC: 10 K/uL (ref 4.0–10.5)
nRBC: 0 % (ref 0.0–0.2)

## 2024-03-31 LAB — BASIC METABOLIC PANEL WITH GFR
Anion gap: 15 (ref 5–15)
BUN: 19 mg/dL (ref 8–23)
CO2: 24 mmol/L (ref 22–32)
Calcium: 8.9 mg/dL (ref 8.9–10.3)
Chloride: 101 mmol/L (ref 98–111)
Creatinine, Ser: 1.15 mg/dL (ref 0.61–1.24)
GFR, Estimated: 58 mL/min — ABNORMAL LOW
Glucose, Bld: 82 mg/dL (ref 70–99)
Potassium: 3.4 mmol/L — ABNORMAL LOW (ref 3.5–5.1)
Sodium: 140 mmol/L (ref 135–145)

## 2024-03-31 LAB — GLUCOSE, CAPILLARY: Glucose-Capillary: 111 mg/dL — ABNORMAL HIGH (ref 70–99)

## 2024-03-31 LAB — PROCALCITONIN: Procalcitonin: 0.16 ng/mL

## 2024-03-31 LAB — MRSA NEXT GEN BY PCR, NASAL: MRSA by PCR Next Gen: NOT DETECTED

## 2024-03-31 MED ORDER — POTASSIUM CHLORIDE CRYS ER 20 MEQ PO TBCR
40.0000 meq | EXTENDED_RELEASE_TABLET | Freq: Once | ORAL | Status: AC
  Administered 2024-03-31: 40 meq via ORAL
  Filled 2024-03-31: qty 2

## 2024-03-31 MED ORDER — SODIUM CHLORIDE 0.9 % IV SOLN: 2.0000 g | Freq: Two times a day (BID) | INTRAVENOUS | Status: DC

## 2024-03-31 MED ORDER — ORAL CARE MOUTH RINSE: 15.0000 mL | OROMUCOSAL | Status: DC | PRN

## 2024-03-31 MED ORDER — CHLORHEXIDINE GLUCONATE CLOTH 2 % EX PADS
6.0000 | MEDICATED_PAD | Freq: Every day | CUTANEOUS | Status: DC
  Administered 2024-03-31 – 2024-04-07 (×8): 6 via TOPICAL

## 2024-03-31 NOTE — [place] FL2 (Signed)
 " [place]  MEDICAID FL2 LEVEL OF CARE FORM     IDENTIFICATION  Patient Name: [name] Birthdate: 08-05-1925 Sex: male Admission Date (Current Location): 03/30/2024  [facility] and [place] Number:  [employer] and Address:  [facility],  [address] [place] [address]      Provider Number: [id number]  Attending Physician Name and Address:  Vicci Afton CROME, MD  Relative Name and Phone Number:  Cummings,Ava  Daughter,  [phone]    Current Level of Care:   Recommended Level of Care: Skilled Nursing Facility Prior Approval Number:    Date Approved/Denied:   PASRR Number: [id number]  Discharge Plan: SNF    Current Diagnoses: Patient Active Problem List   Diagnosis Date Noted   Shock (HCC) 03/30/2024   Poor dentition 02/22/2024   Bilateral pleural effusion 01/02/2024   Insomnia 01/02/2024   HFrEF (heart failure with reduced ejection fraction) (HCC) 12/30/2023   Thrombocytopenia 03/13/2022   LBBB (left bundle branch block) 03/12/2022   Stage 3b chronic kidney disease (HCC) 08/19/2021   DOE (dyspnea on exertion) 02/12/2020   Peripheral edema 01/29/2020   S/P total knee replacement, left 06/09/10 01/03/2018   AAA (abdominal aortic aneurysm) 09/24/2016   Cognitive dysfunction 12/27/2014   Hyperlipidemia 12/27/2014   Gout 01/11/2014   HTN (hypertension) 01/11/2014   Anemia, iron deficiency 04/03/2013   S/P total knee replacement, right 09/21/09 09/16/2010   GASTROESOPHAGEAL REFLUX DISEASE 05/08/2010   TOBACCO ABUSE 05/08/2010    Orientation RESPIRATION BLADDER Height & Weight     Self, Place  Normal Continent Weight: 62.2 kg Height:  5' 11 (180.3 cm)  BEHAVIORAL SYMPTOMS/MOOD NEUROLOGICAL BOWEL NUTRITION STATUS      Continent Diet  AMBULATORY STATUS COMMUNICATION OF NEEDS Skin   Extensive Assist Verbally Normal                       Personal Care Assistance Level of Assistance  Bathing, Feeding, Dressing Bathing  Assistance: Maximum assistance Feeding assistance: Limited assistance Dressing Assistance: Maximum assistance     Functional Limitations Info  Sight, Hearing, Speech Sight Info: Impaired Hearing Info: Impaired Speech Info: Adequate    SPECIAL CARE FACTORS FREQUENCY  PT (By licensed PT)     PT Frequency: 5 times weeks              Contractures Contractures Info: Not present    Additional Factors Info  Code Status, Allergies Code Status Info: DNR- limited Allergies Info: Penicillin           Current Medications (03/31/2024):  This is the current hospital active medication list Current Facility-Administered Medications  Medication Dose Route Frequency Provider Last Rate Last Admin   acetaminophen  (TYLENOL ) tablet 650 mg  650 mg Oral Q6H PRN Elgergawy, Dawood S, MD       Or   acetaminophen  (TYLENOL ) suppository 650 mg  650 mg Rectal Q6H PRN Elgergawy, Dawood S, MD       aspirin  chewable tablet 81 mg  81 mg Oral Daily Elgergawy, Dawood S, MD   81 mg at 03/31/24 1040   Chlorhexidine  Gluconate Cloth 2 % PADS 6 each  6 each Topical Daily Elgergawy, Dawood S, MD   6 each at 03/31/24 1040   heparin  injection 5,000 Units  5,000 Units Subcutaneous Q8H Elgergawy, Dawood S, MD   5,000 Units at 03/31/24 1404   HYDROcodone -acetaminophen  (NORCO/VICODIN) 5-325 MG per tablet 1-2 tablet  1-2 tablet Oral Q4H PRN Elgergawy, Dawood S,  MD       midodrine  (PROAMATINE ) tablet 10 mg  10 mg Oral TID WC Elgergawy, Dawood S, MD   10 mg at 03/31/24 1129   rosuvastatin  (CRESTOR ) tablet 20 mg  20 mg Oral Daily Elgergawy, Dawood S, MD   20 mg at 03/31/24 1040   tamsulosin  (FLOMAX ) capsule 0.4 mg  0.4 mg Oral QPC supper Elgergawy, Dawood S, MD   0.4 mg at 03/30/24 1855   valACYclovir  (VALTREX ) tablet 500 mg  500 mg Oral BID Elgergawy, Dawood S, MD   500 mg at 03/31/24 1040     Discharge Medications: Please see discharge summary for a list of discharge medications.  Relevant Imaging  Results:  Relevant Lab Results:   Additional Information SS# [id number]  Sharlyne Stabs, RN     "

## 2024-03-31 NOTE — Hospital Course (Signed)
 89 y.o. male, with a history of asthma, hypertension, gouty arthritis, and hyperlipidemia , systolic CHF with EF 20 to 25%, recurrent right pleural effusion due to CHF. - Patient presents to ED secondary to shortness of breath and weakness, as well as inability to urinate, send with temperature effusion, hypoxia placed on yesterday, reports this morning still with significant dyspnea, he denies any fever, chills, reports his chronic cough unchanged. - in ED  his workup was significant for small pleural effusion, pulmonary elevated at 94, lactic acid of 3.6, creatinine at baseline 1.44, to have retention, Foley catheter inserted with 600 cc of immediate urine output, was hypotensive 76/59, he responded to fluid bolus, chest x-ray significant for mild left pneumothorax left lung base opacity, [facility] hospitalist consulted to admit.

## 2024-03-31 NOTE — TOC Initial Note (Signed)
 Transition of Care [facility]) - Initial/Assessment Note    Patient Details  Name: [name] MRN: [id number] Date of Birth: 10-13-1925  [facility]) CM/SW Contact:    Sharlyne Stabs, RN Phone Number: 03/31/2024, 3:18 PM  Clinical Narrative:    Patient admitted with shock. PT is recommending SNF. Patient sleeping, daughter at the bedside. They are agreeable to SNF, no preferences. FL2 completed and sent out for bed offers to discuss. Medical work up continues. Following to start insurance auth when stable.                Expected Discharge Plan: Skilled Nursing Facility Barriers to Discharge: Continued Medical Work up, SNF Pending bed offer   Patient Goals and CMS Choice Patient states their goals for this hospitalization and ongoing recovery are:: agreeable to SNF CMS Medicare.gov Compare Post Acute Care list provided to:: Patient Represenative (must comment) Choice offered to / list presented to : Sibling [place] ownership interest in [facility].provided to:: Sibling    Expected Discharge Plan and Services     Post Acute Care Choice: Skilled Nursing Facility Living arrangements for the past 2 months: Single Family Home           Prior Living Arrangements/Services Living arrangements for the past 2 months: Single Family Home Lives with:: Self Patient language and need for interpreter reviewed:: Yes Do you feel safe going back to the place where you live?: Yes      Need for Family Participation in Patient Care: Yes (Comment) Care giver support system in place?: Yes (comment)   Criminal Activity/Legal Involvement Pertinent to Current Situation/Hospitalization: No - Comment as needed  Activities of Daily Living      Permission Sought/Granted            Permission granted to share info w Relationship: Daughter     Emotional Assessment     Affect (typically observed): Accepting Orientation: : Oriented to Self, Oriented to Place,  Oriented to Situation Alcohol / Substance Use: Not Applicable Psych Involvement: No (comment)  Admission diagnosis:  Shock (HCC) [R57.9] Acute urinary retention [R33.8] Postprocedural pneumothorax [J95.811] Patient Active Problem List   Diagnosis Date Noted   Shock (HCC) 03/30/2024   Poor dentition 02/22/2024   Bilateral pleural effusion 01/02/2024   Insomnia 01/02/2024   HFrEF (heart failure with reduced ejection fraction) (HCC) 12/30/2023   Thrombocytopenia 03/13/2022   LBBB (left bundle branch block) 03/12/2022   Stage 3b chronic kidney disease (HCC) 08/19/2021   DOE (dyspnea on exertion) 02/12/2020   Peripheral edema 01/29/2020   S/P total knee replacement, left 06/09/10 01/03/2018   AAA (abdominal aortic aneurysm) 09/24/2016   Cognitive dysfunction 12/27/2014   Hyperlipidemia 12/27/2014   Gout 01/11/2014   HTN (hypertension) 01/11/2014   Anemia, iron deficiency 04/03/2013   S/P total knee replacement, right 09/21/09 09/16/2010   GASTROESOPHAGEAL REFLUX DISEASE 05/08/2010   TOBACCO ABUSE 05/08/2010   PCP:  Alphonsa Glendia LABOR, MD Pharmacy:   [facility] - [place], [place] - [address] [place] [address] Phone: [phone] Fax: [phone]     Social Drivers of Health (SDOH) Social History: SDOH Screenings   Food Insecurity: No Food Insecurity (03/12/2022)  Housing: Low Risk (03/20/2022)  Transportation Needs: No Transportation Needs (03/20/2022)  Utilities: Not At Risk (03/12/2022)  Depression (PHQ2-9): Low Risk (02/02/2024)  Financial Resource Strain: Low Risk (03/18/2022)  Tobacco Use: Medium Risk (03/30/2024)   SDOH Interventions:     Readmission Risk Interventions  03/13/2022    2:05 PM  Readmission Risk Prevention Plan  Post Dischage Appt Not Complete  Medication Screening Complete  Transportation Screening Complete

## 2024-03-31 NOTE — Plan of Care (Signed)
" °  Problem: Acute Rehab PT Goals(only PT should resolve) Goal: Pt Will Go Supine/Side To Sit Outcome: Progressing Flowsheets (Taken 03/31/2024 1431) Pt will go Supine/Side to Sit:  with supervision  with contact guard assist Goal: Patient Will Transfer Sit To/From Stand Outcome: Progressing Flowsheets (Taken 03/31/2024 1431) Patient will transfer sit to/from stand:  with supervision  with contact guard assist Goal: Pt Will Transfer Bed To Chair/Chair To Bed Outcome: Progressing Flowsheets (Taken 03/31/2024 1431) Pt will Transfer Bed to Chair/Chair to Bed:  with contact guard assist  with min assist Goal: Pt Will Ambulate Outcome: Progressing Flowsheets (Taken 03/31/2024 1431) Pt will Ambulate:  50 feet  with minimal assist  with contact guard assist  with rolling walker   2:31 PM, 03/31/24 Lynwood Music, MPT Physical Therapist with [facility] 336 [phone] office [phone] mobile phone  "

## 2024-03-31 NOTE — Progress Notes (Signed)
 eLink Physician-Brief Progress Note Patient Name: [name] DOB: [birth year] MRN: [id number]   Date of Service  03/31/2024  HPI/Events of Note  89/M with CHF with EF 20-25%, with thoracentesis done on 03/29/24 removing 1.8L of serosanguinous fluid, brought to the ED with complaints of shortness of breath. Pt was noted to be hypotensive, started on broad spectrum antibiotics.  Pt subsequently started on levophed.   BP 109/64, HR 79, RR 24, O2 sats 97%  CXR 1. Small left pneumothorax. 2. Patchy opacity at the left lung base with associated pleural effusion and elevated left hemidiaphragm. 3. Cardiomegaly, unchanged. Calcified aorta. Lactic acid 1.9 <-- 3.0  eICU Interventions  Continue antibiotics.  Follow up cultures. Send sputum culture if able.  Check MRSA swab.  Check procalcitonin.  Titrate pressors to keep MAP >65.  Agree with  midodrine .  Heparin  for DVT prophylaxis.      Intervention Category Evaluation Type: New Patient Evaluation  [name] 03/31/2024, 4:18 AM

## 2024-03-31 NOTE — Plan of Care (Signed)
  Problem: Education: Goal: Knowledge of General Education information will improve Description: Including pain rating scale, medication(s)/side effects and non-pharmacologic comfort measures Outcome: Progressing   Problem: Coping: Goal: Level of anxiety will decrease Outcome: Progressing   Problem: Pain Managment: Goal: General experience of comfort will improve and/or be controlled Outcome: Progressing

## 2024-03-31 NOTE — Progress Notes (Signed)
 " PROGRESS NOTE   [name]  FMW:[id number] DOB: August 30, 1925 DOA: 03/30/2024 PCP: Alphonsa Glendia LABOR, MD   Chief Complaint  Patient presents with   Shortness of Breath   Level of care: ICU  Brief Admission History:  89 y.o. male, with a history of asthma, hypertension, gouty arthritis, and hyperlipidemia , systolic CHF with EF 20 to 25%, recurrent right pleural effusion due to CHF. - Patient presents to ED secondary to shortness of breath and weakness, as well as inability to urinate, send with temperature effusion, hypoxia placed on yesterday, reports this morning still with significant dyspnea, he denies any fever, chills, reports his chronic cough unchanged. - in ED  his workup was significant for small pleural effusion, pulmonary elevated at 94, lactic acid of 3.6, creatinine at baseline 1.44, to have retention, Foley catheter inserted with 600 cc of immediate urine output, was hypotensive 76/59, he responded to fluid bolus, chest x-ray significant for mild left pneumothorax left lung base opacity, [facility] hospitalist consulted to admit.   Assessment and Plan:  Cardiogenic Shock - Pt presented Hypotensive, with elevated lactic acid, tachycardic - He had an excellent response to fluid, but for resuscitation is very limited due to heart failure, DC IV fluid today - procalcitonin 0.16 very low likelihood of bacterial infection, DC antibiotics  - known EF 20 to 25%, I have discussed this with daughter, please see discussion under goals of care, currently we are limited, and advanced age, gentle hydration will monitor closely, will start on midodrine  for hypotension, not a candidate for advanced therapies. - Hold antihypertensive medications until BP recovers   Shingles - Left chest area in a dermatomal pattern; continue valtrex , contact/airborne isolation   Lactic acidosis--RESOLVED  -- resolved with IV fluid  Urinary retention  - Foley catheter inserted, started on Flomax     CKD  stage IIIb - Creatinine at baseline   SVT - Patient had SVT while in the ED, converted to NSR without intervention   Acute on chronic diastolic CHF>> with cardiogenic shock - EF 20 to 25% - Hold GDMT medications including Aldactone , Farxiga  and losartan  given low blood pressure   Recurrent Left pleural effusion Pneumothorax - S/p thoracentesis 03/29/24 where 1.8 L fluid was drained, this is most likely in the setting of CHF, no labs were sent on that effusion. - Chest x-ray with small pneumothorax, ED discussed with Dr. Annella, who recommended to start on oxygen  and repeat x-ray in a.m. - repeat CXR 1/16 no findings of pneumothorax reported   Failure to thrive - Continue with mirtazapine  - Will start on supplements    Goals of care  -Discussed with daughter, is an [employer], will continue with current measures with no escalation of care, will continue with antibiotics, follow septic workup, regarding low EF she understands he is not a candidate for advanced therapies, we will hold his antihypertensive medications, started on midodrine , with no escalation of care, no central lines, if he decompensates/deteriorates despite that then plan to proceed with full comfort measures, palliative medicine consulted  DVT prophylaxis: sq heparin   Code Status: DNR  Family Communication:  Disposition: TBD    Consultants:  Palliative care  Procedures:   Antimicrobials:    Subjective: Pt reports he feels weak and tired and short of breath.    Objective: Vitals:   03/31/24 0500 03/31/24 0600 03/31/24 0700 03/31/24 0815  BP: 109/60 (!) 103/43 (!) 102/34   Pulse:      Resp: 18 18 19    Temp:  98 F (36.7 C)  TempSrc:    Axillary  SpO2:      Weight:      Height:        Intake/Output Summary (Last 24 hours) at 03/31/2024 1256 Last data filed at 03/31/2024 0334 Gross per 24 hour  Intake --  Output 800 ml  Net -800 ml   Filed Weights   03/30/24 1139 03/31/24 0400  Weight: 65.8 kg 62.2 kg    Examination:  General exam: frail, elderly male, Appears calm and comfortable  Respiratory system: diminished BS LLL Cardiovascular system: normal S1 & S2 heard. No JVD, murmurs, rubs, gallops or clicks. 1-2+ pedal edema. Gastrointestinal system: Abdomen is nondistended, soft and nontender. No organomegaly or masses felt. Normal bowel sounds heard. Central nervous system: Alert and oriented. No focal neurological deficits. Extremities: 1-2+ pretibial edema BLEs, Symmetric 5 x 5 power. Skin: No rashes, lesions or ulcers. Psychiatry: Judgement and insight appear normal. Mood & affect appropriate.   Data Reviewed: I have personally reviewed following labs and imaging studies  CBC: Recent Labs  Lab 03/30/24 1237 03/30/24 1802 03/31/24 0524  WBC 5.6 5.0 10.0  NEUTROABS 4.4 4.0  --   HGB 10.7* 10.6* 10.8*  HCT 36.5* 37.5* 35.9*  MCV 103.1* 106.8* 102.0*  PLT 160 141* 156    Basic Metabolic Panel: Recent Labs  Lab 03/30/24 1237 03/30/24 1802 03/31/24 0524  NA 139 139 140  K 3.8 3.4* 3.4*  CL 101 101 101  CO2 21* 21* 24  GLUCOSE 99 97 82  BUN 18 18 19   CREATININE 1.44* 1.32* 1.15  CALCIUM  9.3 8.9 8.9    CBG: No results for input(s): GLUCAP in the last 168 hours.  Recent Results (from the past 240 hours)  Blood Culture (routine x 2)     Status: None (Preliminary result)   Collection Time: 03/30/24 12:37 PM   Specimen: BLOOD  Result Value Ref Range Status   Specimen Description BLOOD BLOOD RIGHT ARM  Final   Special Requests NONE  Final   Culture   Final    NO GROWTH < 24 HOURS Performed at [facility], [address] [place], [place] [address]    Report Status PENDING  Incomplete  Blood Culture (routine x 2)     Status: None (Preliminary result)   Collection Time: 03/30/24  1:00 PM   Specimen: BLOOD  Result Value Ref Range Status   Specimen Description BLOOD BLOOD LEFT ARM  Final   Special Requests NONE  Final   Culture   Final    NO GROWTH < 24  HOURS Performed at [facility], [address] [place], [place] [address]    Report Status PENDING  Incomplete  Resp panel by RT-PCR (RSV, Flu A&B, Covid) Urine, Clean Catch     Status: None   Collection Time: 03/30/24  1:57 PM   Specimen: Urine, Clean Catch; Nasal Swab  Result Value Ref Range Status   SARS Coronavirus 2 by RT PCR NEGATIVE NEGATIVE Final    Comment: (NOTE) SARS-CoV-2 target nucleic acids are NOT DETECTED.  The SARS-CoV-2 RNA is generally detectable in upper respiratory specimens during the acute phase of infection. The lowest concentration of SARS-CoV-2 viral copies this assay can detect is 138 copies/mL. A negative result does not preclude SARS-Cov-2 infection and should not be used as the sole basis for treatment or other patient management decisions. A negative result may occur with  improper specimen collection/handling, submission of specimen other than nasopharyngeal swab,  presence of viral mutation(s) within the areas targeted by this assay, and inadequate number of viral copies(<138 copies/mL). A negative result must be combined with clinical observations, patient history, and epidemiological information. The expected result is Negative.  Fact Sheet for Patients:  bloggercourse.com  Fact Sheet for Healthcare Providers:  seriousbroker.it  This test is no t yet approved or cleared by the [place]  FDA and  has been authorized for detection and/or diagnosis of SARS-CoV-2 by FDA under an Emergency Use Authorization (EUA). This EUA will remain  in effect (meaning this test can be used) for the duration of the COVID-19 declaration under Section 564(b)(1) of the Act, 21 U.S.C.section 360bbb-3(b)(1), unless the authorization is terminated  or revoked sooner.       Influenza A by PCR NEGATIVE NEGATIVE Final   Influenza B by PCR NEGATIVE NEGATIVE Final    Comment: (NOTE) The Xpert Xpress  SARS-CoV-2/FLU/RSV plus assay is intended as an aid in the diagnosis of influenza from Nasopharyngeal swab specimens and should not be used as a sole basis for treatment. Nasal washings and aspirates are unacceptable for Xpert Xpress SARS-CoV-2/FLU/RSV testing.  Fact Sheet for Patients: bloggercourse.com  Fact Sheet for Healthcare Providers: seriousbroker.it  This test is not yet approved or cleared by the [place]  FDA and has been authorized for detection and/or diagnosis of SARS-CoV-2 by FDA under an Emergency Use Authorization (EUA). This EUA will remain in effect (meaning this test can be used) for the duration of the COVID-19 declaration under Section 564(b)(1) of the Act, 21 U.S.C. section 360bbb-3(b)(1), unless the authorization is terminated or revoked.     Resp Syncytial Virus by PCR NEGATIVE NEGATIVE Final    Comment: (NOTE) Fact Sheet for Patients: bloggercourse.com  Fact Sheet for Healthcare Providers: seriousbroker.it  This test is not yet approved or cleared by the [place]  FDA and has been authorized for detection and/or diagnosis of SARS-CoV-2 by FDA under an Emergency Use Authorization (EUA). This EUA will remain in effect (meaning this test can be used) for the duration of the COVID-19 declaration under Section 564(b)(1) of the Act, 21 U.S.C. section 360bbb-3(b)(1), unless the authorization is terminated or revoked.  Performed at [facility], [address], [place], [place] [address]   MRSA Next Gen by PCR, Nasal     Status: None   Collection Time: 03/31/24  4:00 AM   Specimen: Nasal Mucosa; Nasal Swab  Result Value Ref Range Status   MRSA by PCR Next Gen NOT DETECTED NOT DETECTED Final    Comment: (NOTE) The GeneXpert MRSA Assay (FDA approved for NASAL specimens only), is one component of a comprehensive MRSA colonization surveillance program.  It is not intended to diagnose MRSA infection nor to guide or monitor treatment for MRSA infections. Test performance is not FDA approved in patients less than 28 years old. Performed at [facility], [address] [place], [place] [address]      Radiology Studies: DG CHEST PORT 1 VIEW Result Date: 03/31/2024 CLINICAL DATA:  Pneumothorax EXAM: PORTABLE CHEST 1 VIEW COMPARISON:  Yesterday FINDINGS: Stable cardiomegaly. Moderate left basilar pleural effusion. Minimal right basilar subsegmental atelectasis or scarring. IMPRESSION: Moderate left basilar pleural effusion. Electronically Signed   By: Lynwood Landy Raddle M.D.   On: 03/31/2024 09:29   DG Chest Portable 1 View Result Date: 03/30/2024 EXAM: 1 VIEW(S) XRAY OF THE CHEST 03/30/2024 11:59:53 AM COMPARISON: 03/29/2024 CLINICAL HISTORY: dyspnea dyspnea dyspnea FINDINGS: LUNGS AND PLEURA: Small left pneumothorax. Elevated left hemidiaphragm. Patchy opacity  left lung base with associated pleural effusion. HEART AND MEDIASTINUM: Cardiomegaly, unchanged. Calcified aorta. BONES AND SOFT TISSUES: No acute osseous abnormality. IMPRESSION: 1. Small left pneumothorax. 2. Patchy opacity at the left lung base with associated pleural effusion and elevated left hemidiaphragm. 3. Cardiomegaly, unchanged. Calcified aorta. Electronically signed by: Evalene Coho MD 03/30/2024 12:17 PM EST RP Workstation: [id number]   US  THORACENTESIS LEFT ASP PLEURAL SPACE W/IMG GUIDE Result Date: 03/29/2024 INDICATION: 89 year old male. History of CHF with recurrent left-sided pleural effusion. Request is for therapeutic thoracentesis. EXAM: ULTRASOUND GUIDED THERAPEUTIC LEFT-SIDED THORACENTESIS MEDICATIONS: Lidocaine  1% 10 mL COMPLICATIONS: None immediate. PROCEDURE: An ultrasound guided thoracentesis was thoroughly discussed with the patient and questions answered. The benefits, risks, alternatives and complications were also discussed. The patient understands and wishes to  proceed with the procedure. Written consent was obtained. Ultrasound was performed to localize and mark an adequate pocket of fluid in the left chest. The area was then prepped and draped in the normal sterile fashion. 1% Lidocaine  was used for local anesthesia. Under ultrasound guidance a 6 Fr Safe-T-Centesis catheter was introduced. Thoracentesis was performed. The catheter was removed and a dressing applied. FINDINGS: A total of approximately 1.5 L of serosanguineous fluid was removed. IMPRESSION: Successful ultrasound guided therapeutic left-sided thoracentesis yielding 1.8 of serosanguineous pleural fluid. Performed by Delon Beagle NP Electronically Signed   By: Ester Sides M.D.   On: 03/29/2024 14:43   DG Chest Port 1 View Result Date: 03/29/2024 EXAM: 1 VIEW(S) XRAY OF THE CHEST 03/29/2024 01:45:42 PM COMPARISON: 03/27/2024 CLINICAL HISTORY: S/P thoracentesis. FINDINGS: LUNGS AND PLEURA: Decreased left pleural effusion status post thoracentesis. No significant pneumothorax identified. Persistent left lung base atelectasis. Trace right pleural effusion. Decreased interstitial edema. No focal pulmonary opacity. HEART AND MEDIASTINUM: Cardiomegaly, stable. BONES AND SOFT TISSUES: No acute osseous abnormality. IMPRESSION: 1. Decreased left pleural effusion status post thoracentesis. No significant pneumothorax identified. 2. Persistent left lung base atelectasis. 3. Trace right pleural effusion. Electronically signed by: Taylor Stroud MD 03/29/2024 02:28 PM EST RP Workstation: [id number]    Scheduled Meds:  aspirin   81 mg Oral Daily   Chlorhexidine  Gluconate Cloth  6 each Topical Daily   heparin   5,000 Units Subcutaneous Q8H   midodrine   10 mg Oral TID WC   rosuvastatin   20 mg Oral Daily   tamsulosin   0.4 mg Oral QPC supper   valACYclovir   500 mg Oral BID   Continuous Infusions:   LOS: 1 day   Time spent: 54 mins  Junell Cullifer Vicci, MD How to contact the [facility] Attending or Consulting  provider 7A - 7P or covering provider during after hours 7P -7A, for this patient?  Check the care team in [facility] and look for a) attending/consulting [facility] provider listed and b) the TRH team listed Log into [email or web address] to find provider on call.  Locate the TRH provider you are looking for under [facility] and page to a number that you can be directly reached. If you still have difficulty reaching the provider, please page the [facility] (Director on Call) for the Hospitalists listed on amion for assistance.  03/31/2024, 12:56 PM    "

## 2024-03-31 NOTE — Evaluation (Signed)
 Physical Therapy Evaluation Patient Details Name: [name] MRN: [id number] DOB: 05-Jun-1925 Today's Date: 03/31/2024  History of Present Illness  [name]  is a 89 y.o. male, with a history of asthma, hypertension, gouty arthritis, and hyperlipidemia , systolic CHF with EF 20 to 25%, recurrent right pleural effusion due to CHF.  - Patient presents to ED secondary to shortness of breath and weakness, as well as inability to urinate, send with temperature effusion, hypoxia placed on yesterday, reports this morning still with significant dyspnea, he denies any fever, chills, reports his chronic cough unchanged.   Clinical Impression  Patient very HOH right ear, able to follow directions when spoken to on left side. Patient demonstrates labored movement for sitting up at bedside with [facility] slightly raised, has difficulty transferring to/from chair due to BLE with loss of balance during stand to sits, tolerated ambulating in room with slow labored movement, most difficulty completing turns due to scissoring of legs and limited mostly due to c/o fatigue and weakness. Patient will benefit from continued skilled physical therapy in hospital and recommended venue below to increase strength, balance, endurance for safe ADLs and gait.       If plan is discharge home, recommend the following: A lot of help with bathing/dressing/bathroom;A lot of help with walking and/or transfers;Help with stairs or ramp for entrance;Assist for transportation;Assistance with cooking/housework   Can travel by private vehicle   Yes    Equipment Recommendations None recommended by PT  Recommendations for Other Services       Functional Status Assessment Patient has had a recent decline in their functional status and demonstrates the ability to make significant improvements in function in a reasonable and predictable amount of time.     Precautions / Restrictions Precautions Precautions: Fall Recall of  Precautions/Restrictions: Intact Restrictions Weight Bearing Restrictions Per Provider Order: No      Mobility  Bed Mobility Overal bed mobility: Needs Assistance Bed Mobility: Sit to Supine       Sit to supine: Contact guard assist, Min assist, HOB elevated   General bed mobility comments: HOB partially rasied    Transfers Overall transfer level: Needs assistance Equipment used: Rolling walker (2 wheels) Transfers: Sit to/from Stand, Bed to chair/wheelchair/BSC Sit to Stand: Min assist Stand pivot transfers: Min assist, Mod assist         General transfer comment: unsteady labored movement, tends to drop in to chair during stand to sitting    Ambulation/Gait Ambulation/Gait assistance: Min assist, Mod assist Gait Distance (Feet): 22 Feet Assistive device: Rolling walker (2 wheels) Gait Pattern/deviations: Decreased step length - right, Decreased step length - left, Decreased stride length, Trunk flexed, Scissoring Gait velocity: slow     General Gait Details: slow labored movement requing increased time for making turns with scissoring of legs, limited mostly due to weakness, on room air with SpO2 at 88-91%  Stairs            Wheelchair Mobility     Tilt Bed    Modified Rankin (Stroke Patients Only)       Balance Overall balance assessment: Needs assistance Sitting-balance support: Feet supported, No upper extremity supported Sitting balance-Leahy Scale: Fair Sitting balance - Comments: fair/good seated at EOB   Standing balance support: Reliant on assistive device for balance, During functional activity, Bilateral upper extremity supported Standing balance-Leahy Scale: Poor Standing balance comment: fair/poor using RW  Pertinent Vitals/Pain Pain Assessment Pain Assessment: No/denies pain    Home Living Family/patient expects to be discharged to:: Private residence Living Arrangements: Alone Available  Help at Discharge: Family;Available PRN/intermittently Type of Home: House Home Access: Level entry       Home Layout: One level Home Equipment: Agricultural Consultant (2 wheels);BSC/3in1;Shower seat;Grab bars - tub/shower      Prior Function Prior Level of Function : Independent/Modified Independent             Mobility Comments: Household ambulation using RW ADLs Comments: Mostly independent for household, Assisted by family for community     Extremity/Trunk Assessment   Upper Extremity Assessment Upper Extremity Assessment: Generalized weakness    Lower Extremity Assessment Lower Extremity Assessment: Generalized weakness    Cervical / Trunk Assessment Cervical / Trunk Assessment: Kyphotic  Communication   Communication Factors Affecting Communication: Hearing impaired;Other (comment) (good ear on left side)    Cognition Arousal: Alert Behavior During Therapy: WFL for tasks assessed/performed                             Following commands: Intact       Cueing Cueing Techniques: Verbal cues, Tactile cues     General Comments      Exercises     Assessment/Plan    PT Assessment Patient needs continued PT services  PT Problem List Decreased strength;Decreased activity tolerance;Decreased balance       PT Treatment Interventions DME instruction;Gait training;Stair training;Functional mobility training;Therapeutic activities;Therapeutic exercise;Balance training;Patient/family education    PT Goals (Current goals can be found in the Care Plan section)  Acute Rehab PT Goals Patient Stated Goal: return home after rehab PT Goal Formulation: With patient/family Time For Goal Achievement: 04/14/24 Potential to Achieve Goals: Good    Frequency Min 3X/week     Co-evaluation               AM-PAC PT 6 Clicks Mobility  Outcome Measure Help needed turning from your back to your side while in a flat bed without using bedrails?: A Little Help  needed moving from lying on your back to sitting on the side of a flat bed without using bedrails?: A Little Help needed moving to and from a bed to a chair (including a wheelchair)?: A Little Help needed standing up from a chair using your arms (e.g., wheelchair or bedside chair)?: A Little Help needed to walk in hospital room?: A Lot Help needed climbing 3-5 steps with a railing? : A Lot 6 Click Score: 16    End of Session   Activity Tolerance: Patient tolerated treatment well;Patient limited by fatigue Patient left: in chair;with call bell/phone within reach;with family/visitor present Nurse Communication: Mobility status PT Visit Diagnosis: Unsteadiness on feet (R26.81);Other abnormalities of gait and mobility (R26.89);Muscle weakness (generalized) (M62.81)    Time: 1010-1040 PT Time Calculation (min) (ACUTE ONLY): 30 min   Charges:   PT Evaluation $PT Eval Moderate Complexity: 1 Mod PT Treatments $Therapeutic Activity: 23-37 mins PT General Charges $$ ACUTE PT VISIT: 1 Visit         2:29 PM, 03/31/24 Lynwood Music, MPT Physical Therapist with [facility] [phone] office [phone] mobile phone

## 2024-03-31 NOTE — Progress Notes (Addendum)
 At shift change Night shift RN noted that patients BP was very soft 81/51 (59). After multiple BP retakes with the Dayshift RN BP continued to be soft. Provider was notified. Provider said in the note there was no desire to escalate care that his heart rate was unchanged. Will let provider know if he becomes symptomatic.

## 2024-04-01 DIAGNOSIS — E7849 Other hyperlipidemia: Secondary | ICD-10-CM

## 2024-04-01 DIAGNOSIS — B029 Zoster without complications: Secondary | ICD-10-CM | POA: Diagnosis not present

## 2024-04-01 DIAGNOSIS — L8990 Pressure ulcer of unspecified site, unspecified stage: Secondary | ICD-10-CM | POA: Diagnosis present

## 2024-04-01 DIAGNOSIS — I5020 Unspecified systolic (congestive) heart failure: Secondary | ICD-10-CM | POA: Diagnosis not present

## 2024-04-01 DIAGNOSIS — R579 Shock, unspecified: Secondary | ICD-10-CM | POA: Diagnosis not present

## 2024-04-01 LAB — BASIC METABOLIC PANEL WITH GFR
Anion gap: 10 (ref 5–15)
BUN: 17 mg/dL (ref 8–23)
CO2: 25 mmol/L (ref 22–32)
Calcium: 8.4 mg/dL — ABNORMAL LOW (ref 8.9–10.3)
Chloride: 104 mmol/L (ref 98–111)
Creatinine, Ser: 1.01 mg/dL (ref 0.61–1.24)
GFR, Estimated: >60 mL/min
Glucose, Bld: 85 mg/dL (ref 70–99)
Potassium: 3.8 mmol/L (ref 3.5–5.1)
Sodium: 139 mmol/L (ref 135–145)

## 2024-04-01 LAB — CBC
HCT: 30.6 % — ABNORMAL LOW (ref 39.0–52.0)
Hemoglobin: 9.1 g/dL — ABNORMAL LOW (ref 13.0–17.0)
MCH: 30.5 pg (ref 26.0–34.0)
MCHC: 29.7 g/dL — ABNORMAL LOW (ref 30.0–36.0)
MCV: 102.7 fL — ABNORMAL HIGH (ref 80.0–100.0)
Platelets: 141 K/uL — ABNORMAL LOW (ref 150–400)
RBC: 2.98 MIL/uL — ABNORMAL LOW (ref 4.22–5.81)
RDW: 13.1 % (ref 11.5–15.5)
WBC: 7 K/uL (ref 4.0–10.5)
nRBC: 0 % (ref 0.0–0.2)

## 2024-04-01 LAB — MAGNESIUM: Magnesium: 2.2 mg/dL (ref 1.7–2.4)

## 2024-04-01 MED ORDER — PREGABALIN 25 MG PO CAPS
25.0000 mg | ORAL_CAPSULE | Freq: Every day | ORAL | Status: DC
  Administered 2024-04-01 – 2024-04-07 (×7): 25 mg via ORAL
  Filled 2024-04-01 (×7): qty 1

## 2024-04-01 MED ORDER — MIDODRINE HCL 5 MG PO TABS
15.0000 mg | ORAL_TABLET | Freq: Three times a day (TID) | ORAL | Status: DC
  Administered 2024-04-01 – 2024-04-07 (×18): 15 mg via ORAL
  Filled 2024-04-01 (×18): qty 3

## 2024-04-01 NOTE — Progress Notes (Signed)
 " PROGRESS NOTE   [name]  FMW:[id number] DOB: 05/17/1925 DOA: 03/30/2024 PCP: Alphonsa Glendia LABOR, MD   Chief Complaint  Patient presents with   Shortness of Breath   Level of care: ICU  Brief Admission History:  89 y.o. male, with a history of asthma, hypertension, gouty arthritis, and hyperlipidemia , systolic CHF with EF 20 to 25%, recurrent right pleural effusion due to CHF. - Patient presents to ED secondary to shortness of breath and weakness, as well as inability to urinate, send with temperature effusion, hypoxia placed on yesterday, reports this morning still with significant dyspnea, he denies any fever, chills, reports his chronic cough unchanged. - in ED  his workup was significant for small pleural effusion, pulmonary elevated at 94, lactic acid of 3.6, creatinine at baseline 1.44, to have retention, Foley catheter inserted with 600 cc of immediate urine output, was hypotensive 76/59, he responded to fluid bolus, chest x-ray significant for mild left pneumothorax left lung base opacity, [facility] consulted to admit.   Assessment and Plan:  Cardiogenic Shock - Pt presented Hypotensive, with elevated lactic acid, tachycardic - He had an excellent response to fluid, but for resuscitation is very limited due to heart failure, DC IV fluid today - procalcitonin 0.16 very low likelihood of bacterial infection, DC antibiotics  - known EF 20 to 25%, I have discussed this with daughter, please see discussion under goals of care, currently we are limited, and advanced age, gentle hydration will monitor closely, will start on midodrine  for hypotension, not a candidate for advanced therapies. - Hold antihypertensive medications until BP recovers   Shingles - Left chest area in a dermatomal pattern; continue valtrex , contact/airborne isolation - starting pregabalin  25 mg daily for pain associated with shingles   Lactic acidosis--RESOLVED  -- resolved with IV  fluid  Urinary retention  - Foley catheter inserted, started on Flomax     CKD stage IIIb - Creatinine at baseline   SVT - Patient had SVT while in the ED, converted to NSR without intervention   Acute on chronic diastolic CHF>> with cardiogenic shock - EF 20 to 25% - Hold GDMT medications including Aldactone , Farxiga  and losartan  given low blood pressure   Recurrent Left pleural effusion Pneumothorax - S/p thoracentesis 03/29/24 where 1.8 L fluid was drained, this is most likely in the setting of CHF, no labs were sent on that effusion. - Chest x-ray with small pneumothorax, ED discussed with Dr. Annella, who recommended to start on oxygen  and repeat x-ray in a.m. - repeat CXR 1/16 no findings of pneumothorax reported   Failure to thrive - Continue with mirtazapine  - supplements    Goals of care  -Discussed with daughter, is an [employer], will continue with current measures with no escalation of care, will continue with antibiotics, follow septic workup, regarding low EF she understands he is not a candidate for advanced therapies, we will hold his antihypertensive medications, started on midodrine , with no escalation of care, no central lines, if he decompensates/deteriorates despite that then plan to proceed with full comfort measures, palliative medicine consulted  DVT prophylaxis: sq heparin   Code Status: DNR  Family Communication:  Disposition: TBD    Consultants:  Palliative care  Procedures:   Antimicrobials:    Subjective: Less SOB today but doesn't feel well and feels generally weak.  Pt having pain from shingles lesions  Objective: Vitals:   04/01/24 0900 04/01/24 1030 04/01/24 1100 04/01/24 1200  BP: (!) 88/59 (!) 83/61  (!) 105/53  Pulse:      Resp: 18 18  17   Temp:   (!) 97.2 F (36.2 C)   TempSrc:   Axillary   SpO2: 100% 100%    Weight:      Height:        Intake/Output Summary (Last 24 hours) at 04/01/2024 1251 Last data filed at 04/01/2024 0400 Gross  per 24 hour  Intake 240 ml  Output 700 ml  Net -460 ml   Filed Weights   03/30/24 1139 03/31/24 0400  Weight: 65.8 kg 62.2 kg   Examination:  General exam: frail, elderly male, Appears calm and comfortable  Respiratory system: diminished BS LLL Cardiovascular system: normal S1 & S2 heard. No JVD, murmurs, rubs, gallops or clicks. 1-2+ pedal edema. Gastrointestinal system: Abdomen is nondistended, soft and nontender. No organomegaly or masses felt. Normal bowel sounds heard. Central nervous system: Alert and oriented. No focal neurological deficits. Extremities: 1-2+ pretibial edema BLEs, Symmetric 5 x 5 power. Skin: shigles lesions left side in dermatomal pattern. Psychiatry: Judgement and insight appear normal. Mood & affect appropriate.   Data Reviewed: I have personally reviewed following labs and imaging studies  CBC: Recent Labs  Lab 03/30/24 1237 03/30/24 1802 03/31/24 0524 04/01/24 0551  WBC 5.6 5.0 10.0 7.0  NEUTROABS 4.4 4.0  --   --   HGB 10.7* 10.6* 10.8* 9.1*  HCT 36.5* 37.5* 35.9* 30.6*  MCV 103.1* 106.8* 102.0* 102.7*  PLT 160 141* 156 141*    Basic Metabolic Panel: Recent Labs  Lab 03/30/24 1237 03/30/24 1802 03/31/24 0524 04/01/24 0551  NA 139 139 140 139  K 3.8 3.4* 3.4* 3.8  CL 101 101 101 104  CO2 21* 21* 24 25  GLUCOSE 99 97 82 85  BUN 18 18 19 17   CREATININE 1.44* 1.32* 1.15 1.01  CALCIUM  9.3 8.9 8.9 8.4*  MG  --   --   --  2.2    CBG: Recent Labs  Lab 03/31/24 2217  GLUCAP 111*    Recent Results (from the past 240 hours)  Blood Culture (routine x 2)     Status: None (Preliminary result)   Collection Time: 03/30/24 12:37 PM   Specimen: BLOOD  Result Value Ref Range Status   Specimen Description BLOOD BLOOD RIGHT ARM  Final   Special Requests   Final    BOTTLES DRAWN AEROBIC ONLY Blood Culture adequate volume   Culture   Final    NO GROWTH 2 DAYS Performed at [facility], [address] [place], [place] [address]    Report  Status PENDING  Incomplete  Blood Culture (routine x 2)     Status: None (Preliminary result)   Collection Time: 03/30/24  1:00 PM   Specimen: BLOOD  Result Value Ref Range Status   Specimen Description BLOOD BLOOD LEFT ARM  Final   Special Requests   Final    BOTTLES DRAWN AEROBIC AND ANAEROBIC Blood Culture results may not be optimal due to an inadequate volume of blood received in culture bottles   Culture   Final    NO GROWTH 2 DAYS Performed at [facility], [address], [place], [place] [address]    Report Status PENDING  Incomplete  Resp panel by RT-PCR (RSV, Flu A&B, Covid) Urine, Clean Catch     Status: None   Collection Time: 03/30/24  1:57 PM   Specimen: Urine, Clean Catch; Nasal Swab  Result Value Ref Range Status   SARS Coronavirus 2 by RT  PCR NEGATIVE NEGATIVE Final    Comment: (NOTE) SARS-CoV-2 target nucleic acids are NOT DETECTED.  The SARS-CoV-2 RNA is generally detectable in upper respiratory specimens during the acute phase of infection. The lowest concentration of SARS-CoV-2 viral copies this assay can detect is 138 copies/mL. A negative result does not preclude SARS-Cov-2 infection and should not be used as the sole basis for treatment or other patient management decisions. A negative result may occur with  improper specimen collection/handling, submission of specimen other than nasopharyngeal swab, presence of viral mutation(s) within the areas targeted by this assay, and inadequate number of viral copies(<138 copies/mL). A negative result must be combined with clinical observations, patient history, and epidemiological information. The expected result is Negative.  Fact Sheet for Patients:  bloggercourse.com  Fact Sheet for Healthcare Providers:  seriousbroker.it  This test is no t yet approved or cleared by the [place]  FDA and  has been authorized for detection and/or diagnosis of SARS-CoV-2  by FDA under an Emergency Use Authorization (EUA). This EUA will remain  in effect (meaning this test can be used) for the duration of the COVID-19 declaration under Section 564(b)(1) of the Act, 21 U.S.C.section 360bbb-3(b)(1), unless the authorization is terminated  or revoked sooner.       Influenza A by PCR NEGATIVE NEGATIVE Final   Influenza B by PCR NEGATIVE NEGATIVE Final    Comment: (NOTE) The Xpert Xpress SARS-CoV-2/FLU/RSV plus assay is intended as an aid in the diagnosis of influenza from Nasopharyngeal swab specimens and should not be used as a sole basis for treatment. Nasal washings and aspirates are unacceptable for Xpert Xpress SARS-CoV-2/FLU/RSV testing.  Fact Sheet for Patients: bloggercourse.com  Fact Sheet for Healthcare Providers: seriousbroker.it  This test is not yet approved or cleared by the [place]  FDA and has been authorized for detection and/or diagnosis of SARS-CoV-2 by FDA under an Emergency Use Authorization (EUA). This EUA will remain in effect (meaning this test can be used) for the duration of the COVID-19 declaration under Section 564(b)(1) of the Act, 21 U.S.C. section 360bbb-3(b)(1), unless the authorization is terminated or revoked.     Resp Syncytial Virus by PCR NEGATIVE NEGATIVE Final    Comment: (NOTE) Fact Sheet for Patients: bloggercourse.com  Fact Sheet for Healthcare Providers: seriousbroker.it  This test is not yet approved or cleared by the [place]  FDA and has been authorized for detection and/or diagnosis of SARS-CoV-2 by FDA under an Emergency Use Authorization (EUA). This EUA will remain in effect (meaning this test can be used) for the duration of the COVID-19 declaration under Section 564(b)(1) of the Act, 21 U.S.C. section 360bbb-3(b)(1), unless the authorization is terminated or revoked.  Performed at  [facility], [address], [place], [place] [address]   MRSA Next Gen by PCR, Nasal     Status: None   Collection Time: 03/31/24  4:00 AM   Specimen: Nasal Mucosa; Nasal Swab  Result Value Ref Range Status   MRSA by PCR Next Gen NOT DETECTED NOT DETECTED Final    Comment: (NOTE) The GeneXpert MRSA Assay (FDA approved for NASAL specimens only), is one component of a comprehensive MRSA colonization surveillance program. It is not intended to diagnose MRSA infection nor to guide or monitor treatment for MRSA infections. Test performance is not FDA approved in patients less than 49 years old. Performed at [facility], [address], [place], [place] [address]      Radiology Studies: DG CHEST PORT 1 VIEW Result Date:  03/31/2024 CLINICAL DATA:  Pneumothorax EXAM: PORTABLE CHEST 1 VIEW COMPARISON:  Yesterday FINDINGS: Stable cardiomegaly. Moderate left basilar pleural effusion. Minimal right basilar subsegmental atelectasis or scarring. IMPRESSION: Moderate left basilar pleural effusion. Electronically Signed   By: Lynwood Landy Raddle M.D.   On: 03/31/2024 09:29    Scheduled Meds:  aspirin   81 mg Oral Daily   Chlorhexidine  Gluconate Cloth  6 each Topical Daily   heparin   5,000 Units Subcutaneous Q8H   midodrine   10 mg Oral TID WC   rosuvastatin   20 mg Oral Daily   tamsulosin   0.4 mg Oral QPC supper   valACYclovir   500 mg Oral BID   Continuous Infusions:   LOS: 2 days   Critical Care Procedure Note Authorized and Performed by: KYM Louder MD  Total Critical Care time:  55 mins Due to a high probability of clinically significant, life threatening deterioration, the patient required my highest level of preparedness to intervene emergently and I personally spent this critical care time directly and personally managing the patient.  This critical care time included obtaining a history; examining the patient, pulse oximetry; ordering and review of studies; arranging urgent treatment with  development of a management plan; evaluation of patient's response of treatment; frequent reassessment; and discussions with other providers.  This critical care time was performed to assess and manage the high probability of imminent and life threatening deterioration that could result in multi-organ failure.  It was exclusive of separately billable procedures and treating other patients and teaching time.    Afton Louder, MD How to contact the [facility] Attending or Consulting provider 7A - 7P or covering provider during after hours 7P -7A, for this patient?  Check the care team in [facility] and look for a) attending/consulting [facility] provider listed and b) the [facility] team listed Log into [email or web address] to find provider on call.  Locate the [facility] provider you are looking for under [facility] and page to a number that you can be directly reached. If you still have difficulty reaching the provider, please page the [facility] (Director on Call) for the Hospitalists listed on amion for assistance.  04/01/2024, 12:51 PM    "

## 2024-04-01 NOTE — Plan of Care (Signed)
   Problem: Coping: Goal: Level of anxiety will decrease Outcome: Progressing   Problem: Pain Managment: Goal: General experience of comfort will improve and/or be controlled Outcome: Progressing

## 2024-04-02 DIAGNOSIS — I10 Essential (primary) hypertension: Secondary | ICD-10-CM | POA: Diagnosis not present

## 2024-04-02 DIAGNOSIS — R579 Shock, unspecified: Secondary | ICD-10-CM | POA: Diagnosis not present

## 2024-04-02 DIAGNOSIS — B029 Zoster without complications: Secondary | ICD-10-CM | POA: Diagnosis not present

## 2024-04-02 DIAGNOSIS — I5020 Unspecified systolic (congestive) heart failure: Secondary | ICD-10-CM | POA: Diagnosis not present

## 2024-04-02 MED ORDER — LIDOCAINE 5 % EX PTCH
1.0000 | MEDICATED_PATCH | CUTANEOUS | Status: DC
  Administered 2024-04-02 – 2024-04-06 (×5): 1 via TRANSDERMAL
  Filled 2024-04-02 (×5): qty 1

## 2024-04-02 NOTE — Progress Notes (Signed)
 Airborne and Contact precautions initiated. Area on left upper back that appeared as abrasion is likely open shingles due to his scratching. Xeroform and foam has been applied to the area.

## 2024-04-02 NOTE — Progress Notes (Signed)
 " Troy NOTE   [name]  FMW:[id number] DOB: Jan 18, 1926 DOA: 03/30/2024 PCP: Alphonsa Glendia LABOR, MD   Chief Complaint  Patient presents with   Shortness of Breath   Level of care: Stepdown  Brief Admission History:  89 y.o. male, with a history of asthma, hypertension, gouty arthritis, and hyperlipidemia , systolic CHF with EF 20 to 25%, recurrent right pleural effusion due to CHF. - Patient presents to ED secondary to shortness of breath and weakness, as well as inability to urinate, send with temperature effusion, hypoxia placed on yesterday, reports this morning still with significant dyspnea, he denies any fever, chills, reports his chronic cough unchanged. - in ED  his workup was significant for small pleural effusion, pulmonary elevated at 94, lactic acid of 3.6, creatinine at baseline 1.44, to have retention, Foley catheter inserted with 600 cc of immediate urine output, was hypotensive 76/59, he responded to fluid bolus, chest x-ray significant for mild left pneumothorax left lung base opacity, [facility] hospitalist consulted to admit.   Assessment and Plan:  Cardiogenic Shock - Pt presented Hypotensive, with elevated lactic acid, tachycardic - He had an excellent response to fluid, but for resuscitation is very limited due to heart failure, DC IV fluid today - procalcitonin 0.16 very low likelihood of bacterial infection, DC antibiotics  - known EF 20 to 25%, I have discussed this with daughter, please see discussion under goals of care, currently we are limited, and advanced age, gentle hydration will monitor closely, will start on midodrine  for hypotension, not a candidate for advanced therapies. - Hold antihypertensive medications until BP recovers   Shingles - Left chest area in a dermatomal pattern; continue valtrex , contact/airborne isolation - starting pregabalin  25 mg daily for pain associated with shingles - added lidocaine  patch 5% for pain symptoms   Lactic  acidosis--RESOLVED  -- resolved with IV fluid  Urinary retention  - Foley catheter inserted, started on Flomax     CKD stage IIIb - Creatinine at baseline   SVT - Patient had SVT while in the ED, converted to NSR without intervention   Acute on chronic diastolic CHF>> with cardiogenic shock - EF 20 to 25% - Hold GDMT medications including Aldactone , Farxiga  and losartan  given low blood pressure   Recurrent Left pleural effusion Pneumothorax - S/p thoracentesis 03/29/24 where 1.8 L fluid was drained, this is most likely in the setting of CHF, no labs were sent on that effusion. - Chest x-ray with small pneumothorax, ED discussed with Dr. Annella, who recommended to start on oxygen  and repeat x-ray in a.m. - repeat CXR 1/16 no findings of pneumothorax reported   Failure to thrive - Continue with mirtazapine  - supplements    Goals of care  -Discussed with daughter, is an [employer], will continue with current measures with no escalation of care, will continue with antibiotics, follow septic workup, regarding low EF she understands he is not a candidate for advanced therapies, we will hold his antihypertensive medications, started on midodrine , with no escalation of care, no central lines, if he decompensates/deteriorates despite that then plan to proceed with full comfort measures, palliative medicine consulted  DVT prophylaxis: sq heparin   Code Status: DNR  Family Communication:  Disposition: TBD    Consultants:  Palliative care   Procedures:   Antimicrobials:    Subjective: Pt having pain from herpes zoster.    Objective: Vitals:   04/02/24 1200 04/02/24 1300 04/02/24 1400 04/02/24 1621  BP: 118/61 (!) 93/56 (!) 99/49   Pulse:  84 69 69   Resp: 17 (!) 25 18   Temp:    97.6 F (36.4 C)  TempSrc:    Oral  SpO2: 96% 98% 98%   Weight:      Height:        Intake/Output Summary (Last 24 hours) at 04/02/2024 1731 Last data filed at 04/02/2024 9083 Gross per 24 hour  Intake  --  Output 550 ml  Net -550 ml   Filed Weights   03/30/24 1139 03/31/24 0400  Weight: 65.8 kg 62.2 kg   Examination:  General exam: frail, elderly male, Appears calm and comfortable  Respiratory system: diminished BS LLL Cardiovascular system: normal S1 & S2 heard. No JVD, murmurs, rubs, gallops or clicks. 1-2+ pedal edema. Gastrointestinal system: Abdomen is nondistended, soft and nontender. No organomegaly or masses felt. Normal bowel sounds heard. Central nervous system: Alert and oriented. No focal neurological deficits. Extremities: 1-2+ pretibial edema BLEs, Symmetric 5 x 5 power. Skin: shigles lesions left side in dermatomal pattern. Psychiatry: Judgement and insight appear normal. Mood & affect appropriate.   Data Reviewed: I have personally reviewed following labs and imaging studies  CBC: Recent Labs  Lab 03/30/24 1237 03/30/24 1802 03/31/24 0524 04/01/24 0551  WBC 5.6 5.0 10.0 7.0  NEUTROABS 4.4 4.0  --   --   HGB 10.7* 10.6* 10.8* 9.1*  HCT 36.5* 37.5* 35.9* 30.6*  MCV 103.1* 106.8* 102.0* 102.7*  PLT 160 141* 156 141*    Basic Metabolic Panel: Recent Labs  Lab 03/30/24 1237 03/30/24 1802 03/31/24 0524 04/01/24 0551  NA 139 139 140 139  K 3.8 3.4* 3.4* 3.8  CL 101 101 101 104  CO2 21* 21* 24 25  GLUCOSE 99 97 82 85  BUN 18 18 19 17   CREATININE 1.44* 1.32* 1.15 1.01  CALCIUM  9.3 8.9 8.9 8.4*  MG  --   --   --  2.2    CBG: Recent Labs  Lab 03/31/24 2217  GLUCAP 111*    Recent Results (from the past 240 hours)  Blood Culture (routine x 2)     Status: None (Preliminary result)   Collection Time: 03/30/24 12:37 PM   Specimen: BLOOD  Result Value Ref Range Status   Specimen Description BLOOD BLOOD RIGHT ARM  Final   Special Requests   Final    BOTTLES DRAWN AEROBIC ONLY Blood Culture adequate volume   Culture   Final    NO GROWTH 3 DAYS Performed at [facility], [address] [place], [place] [address]    Report Status PENDING   Incomplete  Blood Culture (routine x 2)     Status: None (Preliminary result)   Collection Time: 03/30/24  1:00 PM   Specimen: BLOOD  Result Value Ref Range Status   Specimen Description BLOOD BLOOD LEFT ARM  Final   Special Requests   Final    BOTTLES DRAWN AEROBIC AND ANAEROBIC Blood Culture results may not be optimal due to an inadequate volume of blood received in culture bottles   Culture   Final    NO GROWTH 3 DAYS Performed at [facility], [address], [place], [place] [address]    Report Status PENDING  Incomplete  Resp panel by RT-PCR (RSV, Flu A&B, Covid) Urine, Clean Catch     Status: None   Collection Time: 03/30/24  1:57 PM   Specimen: Urine, Clean Catch; Nasal Swab  Result Value Ref Range Status   SARS Coronavirus 2 by RT PCR NEGATIVE  NEGATIVE Final    Comment: (NOTE) SARS-CoV-2 target nucleic acids are NOT DETECTED.  The SARS-CoV-2 RNA is generally detectable in upper respiratory specimens during the acute phase of infection. The lowest concentration of SARS-CoV-2 viral copies this assay can detect is 138 copies/mL. A negative result does not preclude SARS-Cov-2 infection and should not be used as the sole basis for treatment or other patient management decisions. A negative result may occur with  improper specimen collection/handling, submission of specimen other than nasopharyngeal swab, presence of viral mutation(s) within the areas targeted by this assay, and inadequate number of viral copies(<138 copies/mL). A negative result must be combined with clinical observations, patient history, and epidemiological information. The expected result is Negative.  Fact Sheet for Patients:  bloggercourse.com  Fact Sheet for Healthcare Providers:  seriousbroker.it  This test is no t yet approved or cleared by the [place]  FDA and  has been authorized for detection and/or diagnosis of SARS-CoV-2 by FDA under an  Emergency Use Authorization (EUA). This EUA will remain  in effect (meaning this test can be used) for the duration of the COVID-19 declaration under Section 564(b)(1) of the Act, 21 U.S.C.section 360bbb-3(b)(1), unless the authorization is terminated  or revoked sooner.       Influenza A by PCR NEGATIVE NEGATIVE Final   Influenza B by PCR NEGATIVE NEGATIVE Final    Comment: (NOTE) The Xpert Xpress SARS-CoV-2/FLU/RSV plus assay is intended as an aid in the diagnosis of influenza from Nasopharyngeal swab specimens and should not be used as a sole basis for treatment. Nasal washings and aspirates are unacceptable for Xpert Xpress SARS-CoV-2/FLU/RSV testing.  Fact Sheet for Patients: bloggercourse.com  Fact Sheet for Healthcare Providers: seriousbroker.it  This test is not yet approved or cleared by the [place]  FDA and has been authorized for detection and/or diagnosis of SARS-CoV-2 by FDA under an Emergency Use Authorization (EUA). This EUA will remain in effect (meaning this test can be used) for the duration of the COVID-19 declaration under Section 564(b)(1) of the Act, 21 U.S.C. section 360bbb-3(b)(1), unless the authorization is terminated or revoked.     Resp Syncytial Virus by PCR NEGATIVE NEGATIVE Final    Comment: (NOTE) Fact Sheet for Patients: bloggercourse.com  Fact Sheet for Healthcare Providers: seriousbroker.it  This test is not yet approved or cleared by the [place]  FDA and has been authorized for detection and/or diagnosis of SARS-CoV-2 by FDA under an Emergency Use Authorization (EUA). This EUA will remain in effect (meaning this test can be used) for the duration of the COVID-19 declaration under Section 564(b)(1) of the Act, 21 U.S.C. section 360bbb-3(b)(1), unless the authorization is terminated or revoked.  Performed at [facility],  [address] [place], [place] [address]   MRSA Next Gen by PCR, Nasal     Status: None   Collection Time: 03/31/24  4:00 AM   Specimen: Nasal Mucosa; Nasal Swab  Result Value Ref Range Status   MRSA by PCR Next Gen NOT DETECTED NOT DETECTED Final    Comment: (NOTE) The GeneXpert MRSA Assay (FDA approved for NASAL specimens only), is one component of a comprehensive MRSA colonization surveillance program. It is not intended to diagnose MRSA infection nor to guide or monitor treatment for MRSA infections. Test performance is not FDA approved in patients less than 34 years old. Performed at [facility], [address], [place], [place] [address]      Radiology Studies: No results found.   Scheduled Meds:  aspirin   81 mg Oral Daily   Chlorhexidine  Gluconate Cloth  6 each Topical Daily   heparin   5,000 Units Subcutaneous Q8H   lidocaine   1 patch Transdermal Q24H   midodrine   15 mg Oral TID WC   pregabalin   25 mg Oral Daily   rosuvastatin   20 mg Oral Daily   tamsulosin   0.4 mg Oral QPC supper   valACYclovir   500 mg Oral BID   Continuous Infusions:   LOS: 3 days   Critical Care Procedure Note Authorized and Performed by: KYM Louder MD  Total Critical Care time:  52 mins Due to a high probability of clinically significant, life threatening deterioration, the patient required my highest level of preparedness to intervene emergently and I personally spent this critical care time directly and personally managing the patient.  This critical care time included obtaining a history; examining the patient, pulse oximetry; ordering and review of studies; arranging urgent treatment with development of a management plan; evaluation of patient's response of treatment; frequent reassessment; and discussions with other providers.  This critical care time was performed to assess and manage the high probability of imminent and life threatening deterioration that could result in multi-organ failure.  It was  exclusive of separately billable procedures and treating other patients and teaching time.   Afton Louder, MD How to contact the [facility] Attending or Consulting provider 7A - 7P or covering provider during after hours 7P -7A, for this patient?  Check the care team in [facility] and look for a) attending/consulting [facility] provider listed and b) the [facility] team listed Log into [email or web address] to find provider on call.  Locate the [facility] provider you are looking for under [facility] and page to a number that you can be directly reached. If you still have difficulty reaching the provider, please page the [facility] (Director on Call) for the Hospitalists listed on amion for assistance.  04/02/2024, 5:31 PM    "

## 2024-04-02 NOTE — Plan of Care (Signed)
  Problem: Education: Goal: Knowledge of General Education information will improve Description: Including pain rating scale, medication(s)/side effects and non-pharmacologic comfort measures Outcome: Progressing   Problem: Coping: Goal: Level of anxiety will decrease Outcome: Progressing   Problem: Pain Managment: Goal: General experience of comfort will improve and/or be controlled Outcome: Progressing

## 2024-04-03 ENCOUNTER — Encounter (HOSPITAL_COMMUNITY): Payer: Self-pay | Admitting: Internal Medicine

## 2024-04-03 DIAGNOSIS — Z7189 Other specified counseling: Secondary | ICD-10-CM | POA: Diagnosis not present

## 2024-04-03 DIAGNOSIS — R601 Generalized edema: Secondary | ICD-10-CM | POA: Diagnosis not present

## 2024-04-03 DIAGNOSIS — I447 Left bundle-branch block, unspecified: Secondary | ICD-10-CM

## 2024-04-03 DIAGNOSIS — B029 Zoster without complications: Secondary | ICD-10-CM | POA: Diagnosis not present

## 2024-04-03 DIAGNOSIS — I110 Hypertensive heart disease with heart failure: Secondary | ICD-10-CM | POA: Diagnosis not present

## 2024-04-03 DIAGNOSIS — I5020 Unspecified systolic (congestive) heart failure: Secondary | ICD-10-CM | POA: Diagnosis not present

## 2024-04-03 DIAGNOSIS — I10 Essential (primary) hypertension: Secondary | ICD-10-CM | POA: Diagnosis not present

## 2024-04-03 DIAGNOSIS — Z515 Encounter for palliative care: Secondary | ICD-10-CM

## 2024-04-03 DIAGNOSIS — R579 Shock, unspecified: Secondary | ICD-10-CM | POA: Diagnosis not present

## 2024-04-03 MED ORDER — VALACYCLOVIR HCL 500 MG PO TABS
1000.0000 mg | ORAL_TABLET | Freq: Two times a day (BID) | ORAL | Status: AC
  Administered 2024-04-03 – 2024-04-05 (×5): 1000 mg via ORAL
  Filled 2024-04-03 (×7): qty 2

## 2024-04-03 MED ORDER — ENSURE PLUS HIGH PROTEIN PO LIQD
237.0000 mL | Freq: Two times a day (BID) | ORAL | Status: DC
  Administered 2024-04-03 – 2024-04-07 (×8): 237 mL via ORAL

## 2024-04-03 NOTE — Consult Note (Signed)
 "                                                                                   Consultation Note Date: 04/03/2024   Patient Name: [name]  DOB: 05/19/1925  MRN: [id number]  Age / Sex: 89 y.o., male  PCP: Alphonsa Glendia LABOR, MD Referring Physician: Vicci Afton CROME, MD  Reason for Consultation: Establishing goals of care  HPI/Patient Profile: 89 y.o. male  with past medical history of asthma, hypertension, gouty arthritis, and hyperlipidemia , systolic CHF with EF 20 to 25%, recurrent right pleural effusion due to CHF  admitted on 03/30/2024 with cardiogenic shock.   Clinical Assessment and Goals of Care: I have reviewed medical records including EPIC notes, labs and imaging, received report from RN, assessed the patient.  [name] is lying quietly in bed.  He appears acutely/chronically ill and somewhat frail.  He is resting comfortably, but wakes easily when I touch his arm.  He will make but not keep eye contact.  He is oriented to self and situation, able to make his needs known.  Other orientation questions not asked due to advanced age and profound hearing loss.    Call to daughter, [name], to discuss diagnosis prognosis, GOC, EOL wishes, disposition and options. I introduced Palliative Medicine as specialized medical care for people living with serious illness. It focuses on providing relief from the symptoms and stress of a serious illness. The goal is to improve quality of life for both the patient and the family.  We then focused on their current illness.  I shared my visit with [name] this morning and his current state.  We talked about his advanced age and chronic illness burden.  We talked about the treatment plan.  We talked about time for outcomes.  At this point, family is requesting time for [name] to declare whether he is able to show meaningful improvement or needs hospice care.  The natural disease trajectory and expectations at EOL were  discussed.  I attempted to elicit values and goals of care important to the patient.    Hospice Care services were discussed.  [name] states that when having hospice discussions, she defers to her sister [name] who is a designer, jewellery.  Discussed the importance of continued conversation with family and the medical providers regarding overall plan of care and treatment options, ensuring decisions are within the context of the patients values and GOCs.  Questions and concerns were addressed.  The family was encouraged to call with questions or concerns.  PMT will continue to support holistically.  Conference with attending, bedside nursing staff, transition of care team related to patient condition, needs, goals of care, disposition.   HCPOA  NEXT OF KIN -[name] states that she and her siblings make choices for their father as a team.  She shares that her sister, [name], as a designer, jewellery.    SUMMARY OF RECOMMENDATIONS   At this point continue to treat the treatable but no CPR or intubation Time for outcomes Open to short-term rehab versus hospice care depending on improvements   Code Status/Advance Care Planning: DNR DNR/goldenrod form completed and  placed on chart.  Symptom Management:  Per hospitalist, no additional needs at this time.  Palliative Prophylaxis:  Frequent Pain Assessment, Oral Care, and Turn Reposition  Additional Recommendations (Limitations, Scope, Preferences): Continue to treat the treatable but no CPR or intubation  Psycho-social/Spiritual:  Desire for further Chaplaincy support:no Additional Recommendations: Caregiving  Support/Resources and Education on Hospice  Prognosis:  < 6 months, would not be surprising based on chronic illness burden, recurrent pleural effusion secondary to heart failure, advanced age.  Discharge Planning: To Be Determined      Primary Diagnoses: Present on Admission:  Shock (HCC)  Peripheral edema  LBBB (left bundle  branch block)  Hyperlipidemia  HTN (hypertension)  HFrEF (heart failure with reduced ejection fraction) (HCC)  Gout  Herpes zoster  Pressure injury of skin   I have reviewed the medical record, interviewed the patient and family, and examined the patient. The following aspects are pertinent.  Past Medical History:  Diagnosis Date   Asthma    Diverticulosis    GERD (gastroesophageal reflux disease)    Gout    Heart murmur    Evaluation by a cardiologist years ago was reportedly negative   HTN (hypertension)    Osteoarthritis    Reflux    Social History   Socioeconomic History   Marital status: Married    Spouse name: [name]   Number of children: Not on file   Years of education: Not on file   Highest education level: Not on file  Occupational History   Occupation: Work at hospital  Tobacco Use   Smoking status: Former    Types: Pipe    Start date: 12/04/1939    Quit date: 1970    Years since quitting: 56.0   Smokeless tobacco: Never   Tobacco comments:    quit smoking cigarettes in 1971  Vaping Use   Vaping status: Never Used  Substance and Sexual Activity   Alcohol use: No   Drug use: No   Sexual activity: Not Currently  Other Topics Concern   Not on file  Social History Narrative   Married, lives with wife [name]. Daughter [name] helps patient.   Social Drivers of Health   Tobacco Use: Medium Risk (04/03/2024)   Patient History    Smoking Tobacco Use: Former    Smokeless Tobacco Use: Never    Passive Exposure: Not on file  Financial Resource Strain: Low Risk (03/18/2022)   Overall Financial Resource Strain (CARDIA)    Difficulty of Paying Living Expenses: Not hard at all  Food Insecurity: Patient Unable To Answer (03/31/2024)   Epic    Worried About Programme Researcher, Broadcasting/film/video in the Last Year: Patient unable to answer    Ran Out of Food in the Last Year: Patient unable to answer  Transportation Needs: Patient Unable To Answer (03/31/2024)   Epic    Lack of  Transportation (Medical): Patient unable to answer    Lack of Transportation (Non-Medical): Patient unable to answer  Physical Activity: Not on file  Stress: Not on file  Social Connections: Patient Unable To Answer (03/31/2024)   Social Connection and Isolation Panel    Frequency of Communication with Friends and Family: Patient unable to answer    Frequency of Social Gatherings with Friends and Family: Patient unable to answer    Attends Religious Services: Patient unable to answer    Active Member of Clubs or Organizations: Patient unable to answer    Attends Banker Meetings: Patient unable to  answer    Marital Status: Patient unable to answer  Depression (PHQ2-9): Low Risk (02/02/2024)   Depression (PHQ2-9)    PHQ-2 Score: 0  Alcohol Screen: Not on file  Housing: Patient Unable To Answer (03/31/2024)   Epic    Unable to Pay for Housing in the Last Year: Patient unable to answer    Number of Times Moved in the Last Year: Not on file    Homeless in the Last Year: Patient unable to answer  Utilities: Patient Unable To Answer (03/31/2024)   Epic    Threatened with loss of utilities: Patient unable to answer  Health Literacy: Not on file   Family History  Problem Relation Age of Onset   Hypertension Mother    Hypertension Father    Heart attack Father    Cancer Brother        colon   Arthritis Other    Heart disease Other        Male < 55   Uterine cancer Maternal Aunt    Scheduled Meds:  aspirin   81 mg Oral Daily   Chlorhexidine  Gluconate Cloth  6 each Topical Daily   feeding supplement  237 mL Oral BID BM   heparin   5,000 Units Subcutaneous Q8H   lidocaine   1 patch Transdermal Q24H   midodrine   15 mg Oral TID WC   pregabalin   25 mg Oral Daily   rosuvastatin   20 mg Oral Daily   tamsulosin   0.4 mg Oral QPC supper   valACYclovir   500 mg Oral BID   Continuous Infusions: PRN Meds:.acetaminophen  **OR** acetaminophen , HYDROcodone -acetaminophen , mouth  rinse Medications Prior to Admission:  Prior to Admission medications  Medication Sig Start Date End Date Taking? Authorizing Provider  acetaminophen  (TYLENOL ) 650 MG CR tablet Take 650-1,300 mg by mouth every 8 (eight) hours as needed for pain.   Yes [provider]  albuterol  (VENTOLIN  HFA) 108 (90 Base) MCG/ACT inhaler Inhale 2 puffs into the lungs every 4 (four) hours as needed for wheezing or shortness of breath. 12/21/23  Yes Alphonsa Glendia LABOR, MD  aspirin  81 MG chewable tablet Chew 1 tablet (81 mg total) by mouth daily. 03/18/22  Yes Tat, Alm, MD  dapagliflozin  propanediol (FARXIGA ) 5 MG TABS tablet Take 1 tablet (5 mg total) by mouth daily before breakfast. 09/27/23  Yes Luking, Glendia LABOR, MD  furosemide  (LASIX ) 20 MG tablet Take 1 tablet (20 mg total) by mouth daily. 01/31/24  Yes Okey Vina GAILS, MD  losartan  (COZAAR ) 25 MG tablet TAKE ONE TABLET BY MOUTH EVERY DAY 10/26/23  Yes Alphonsa Glendia LABOR, MD  mirtazapine  (REMERON ) 7.5 MG tablet Take 1 tablet (7.5 mg total) by mouth at bedtime. 12/30/23  Yes Cook, Jayce G, DO  mupirocin  ointment (BACTROBAN ) 2 % APPLY TWICE DAILY. 01/10/24  Yes Alphonsa Glendia LABOR, MD  rosuvastatin  (CRESTOR ) 20 MG tablet TAKE ONE TABLET BY MOUTH EVERY DAY 04/23/23  Yes Alphonsa Glendia LABOR, MD  spironolactone  (ALDACTONE ) 25 MG tablet TAKE 1/2 TABLET BY MOUTH EVERY DAY 12/13/23  Yes Luking, Glendia LABOR, MD  potassium chloride  (KLOR-CON  M) 10 MEQ tablet Take 10 mEq by mouth daily. Patient not taking: Reported on 03/31/2024    [provider]   Allergies[1] Review of Systems  Unable to perform ROS: Age    Physical Exam Vitals and nursing note reviewed.  Constitutional:      General: He is not in acute distress.    Appearance: He is ill-appearing.  Cardiovascular:  Rate and Rhythm: Normal rate.  Pulmonary:     Effort: Pulmonary effort is normal. No tachypnea.  Musculoskeletal:     Right lower leg: No edema.     Left lower leg: No edema.  Skin:    General:  Skin is warm and dry.     Vital Signs: BP (!) 94/55   Pulse 78   Temp 97.6 F (36.4 C) (Oral)   Resp (!) 24   Ht 5' 11 (1.803 m)   Wt 62.2 kg   SpO2 100%   BMI 19.13 kg/m  Pain Scale: 0-10   Pain Score: 0-No pain   SpO2: SpO2: 100 % O2 Device:SpO2: 100 % O2 Flow Rate: .O2 Flow Rate (L/min): 2 L/min  IO: Intake/output summary:  Intake/Output Summary (Last 24 hours) at 04/03/2024 0826 Last data filed at 04/03/2024 0033 Gross per 24 hour  Intake --  Output 450 ml  Net -450 ml    LBM: Last BM Date : 03/30/24 Baseline Weight: Weight: 65.8 kg Most recent weight: Weight: 62.2 kg     Palliative Assessment/Data:     Time In: 0900  Time Out: 0955 Time Total: 55 minutes  Greater than 50%  of this time was spent counseling and coordinating care related to the above assessment and plan.  Signed by: Lorenza DELENA Birkenhead, NP   Please contact Palliative Medicine Team phone at [phone] for questions and concerns.  For individual provider: See Amion     [1]  Allergies Allergen Reactions   Penicillin G Benzathine     Other reaction(s): Unknown   Penicillins Rash   "

## 2024-04-03 NOTE — Plan of Care (Signed)

## 2024-04-03 NOTE — Progress Notes (Signed)
 PHARMACY NOTE:  ANTIMICROBIAL RENAL DOSAGE ADJUSTMENT  Current antimicrobial regimen includes a mismatch between antimicrobial dosage and estimated renal function.  As per policy approved by the Pharmacy & Therapeutics and Medical Executive Committees, the antimicrobial dosage will be adjusted accordingly.  Current antimicrobial dosage:  valacyclovir  500mg  BID  Indication: SHingle  Renal Function:  Estimated Creatinine Clearance: 37.6 mL/min (by C-G formula based on SCr of 1.01 mg/dL). []      On intermittent HD, scheduled: []      On CRRT    Antimicrobial dosage has been changed to:  Valacyclovir  1gm BID  Additional comments:   Thank you for allowing pharmacy to be a part of this patient's care.  Aamirah Salmi, PharmD, BCPS, BCIDP Work Cell: [phone] 04/03/2024 12:36 PM

## 2024-04-03 NOTE — Progress Notes (Signed)
 " PROGRESS NOTE   [name]  FMW:[id number] DOB: 09-15-1925 DOA: 03/30/2024 PCP: Alphonsa Glendia LABOR, MD   Chief Complaint  Patient presents with   Shortness of Breath   Level of care: Stepdown  Brief Admission History:  89 y.o. male, with a history of asthma, hypertension, gouty arthritis, and hyperlipidemia , systolic CHF with EF 20 to 25%, recurrent right pleural effusion due to CHF. - Patient presents to ED secondary to shortness of breath and weakness, as well as inability to urinate, send with temperature effusion, hypoxia placed on yesterday, reports this morning still with significant dyspnea, he denies any fever, chills, reports his chronic cough unchanged. - in ED  his workup was significant for small pleural effusion, pulmonary elevated at 94, lactic acid of 3.6, creatinine at baseline 1.44, to have retention, Foley catheter inserted with 600 cc of immediate urine output, was hypotensive 76/59, he responded to fluid bolus, chest x-ray significant for mild left pneumothorax left lung base opacity, [facility]ist consulted to admit.   Assessment and Plan:  Cardiogenic Shock - Pt presented Hypotensive, with elevated lactic acid, tachycardic - He had an excellent response to fluid, but for resuscitation is very limited due to heart failure, DC IV fluid today - procalcitonin 0.16 very low likelihood of bacterial infection, DC antibiotics  - known EF 20 to 25%, I have discussed this with daughter, please see discussion under goals of care, currently we are limited, and advanced age, gentle hydration will monitor closely, continue on midodrine  for hypotension, not a candidate for advanced therapies. - Hold antihypertensive medications until BP recovers - discussion with daughter POA that he qualifies for hospice and she verbalized understanding and agreeable    Shingles - Left chest area in a dermatomal pattern; continue valtrex , contact/airborne isolation - starting pregabalin   25 mg daily for pain associated with shingles - added lidocaine  patch 5% for pain symptoms   Lactic acidosis--RESOLVED  -- resolved with IV fluid  Urinary retention  - Foley catheter inserted, started on Flomax   - failed voiding trial, likely re-insert foley today   CKD stage IIIb - Creatinine at baseline   SVT - Patient had SVT while in the ED, converted to NSR without intervention   Acute on chronic diastolic CHF>> with cardiogenic shock - EF 20 to 25% - Hold GDMT medications including Aldactone , Farxiga  and losartan  given low blood pressure   Recurrent Left pleural effusion Pneumothorax - S/p thoracentesis 03/29/24 where 1.8 L fluid was drained, this is most likely in the setting of CHF, no labs were sent on that effusion. - Chest x-ray with small pneumothorax, ED discussed with Dr. Annella, who recommended to start on oxygen  and repeat x-ray in a.m. - repeat CXR 1/16 no findings of pneumothorax reported   Failure to thrive - Continue with mirtazapine  - supplements    Goals of care  -Discussed with daughter, is an [employer], will continue with current measures with no escalation of care, will continue with antibiotics, follow septic workup, regarding low EF she understands he is not a candidate for advanced therapies, we will hold his antihypertensive medications, started on midodrine , with no escalation of care, no central lines, if he decompensates/deteriorates despite that then plan to proceed with full comfort measures, palliative medicine consulted  DVT prophylaxis: sq heparin   Code Status: DNR  Family Communication:  Disposition: TBD    Consultants:  Palliative care   Procedures:   Antimicrobials:    Subjective: Pt having some zoster pain but patches  help a bit.  He says he understands he needs to go to SNF.     Objective: Vitals:   04/03/24 0800 04/03/24 0900 04/03/24 1000 04/03/24 1100  BP: (!) 99/52 116/61 (!) 94/49 108/64  Pulse: 78 78 80 83  Resp: (!) 26  (!) 22 (!) 24 17  Temp:      TempSrc:      SpO2: 100% 98% 98% 100%  Weight:      Height:        Intake/Output Summary (Last 24 hours) at 04/03/2024 1118 Last data filed at 04/03/2024 0033 Gross per 24 hour  Intake --  Output 300 ml  Net -300 ml   Filed Weights   03/30/24 1139 03/31/24 0400  Weight: 65.8 kg 62.2 kg   Examination:  General exam: frail, elderly male, Appears calm and comfortable  Respiratory system: diminished BS LLL Cardiovascular system: normal S1 & S2 heard. No JVD, murmurs, rubs, gallops or clicks. 1-2+ pedal edema. Gastrointestinal system: Abdomen is nondistended, soft and nontender. No organomegaly or masses felt. Normal bowel sounds heard. Central nervous system: Alert and oriented. No focal neurological deficits. Extremities: 1+ pretibial edema BLEs, Symmetric 5 x 5 power. Skin: shigles lesions left side in dermatomal pattern - dried up lesions. Psychiatry: Judgement and insight appear normal. Mood & affect appropriate.   Data Reviewed: I have personally reviewed following labs and imaging studies  CBC: Recent Labs  Lab 03/30/24 1237 03/30/24 1802 03/31/24 0524 04/01/24 0551  WBC 5.6 5.0 10.0 7.0  NEUTROABS 4.4 4.0  --   --   HGB 10.7* 10.6* 10.8* 9.1*  HCT 36.5* 37.5* 35.9* 30.6*  MCV 103.1* 106.8* 102.0* 102.7*  PLT 160 141* 156 141*    Basic Metabolic Panel: Recent Labs  Lab 03/30/24 1237 03/30/24 1802 03/31/24 0524 04/01/24 0551  NA 139 139 140 139  K 3.8 3.4* 3.4* 3.8  CL 101 101 101 104  CO2 21* 21* 24 25  GLUCOSE 99 97 82 85  BUN 18 18 19 17   CREATININE 1.44* 1.32* 1.15 1.01  CALCIUM  9.3 8.9 8.9 8.4*  MG  --   --   --  2.2    CBG: Recent Labs  Lab 03/31/24 2217  GLUCAP 111*    Recent Results (from the past 240 hours)  Blood Culture (routine x 2)     Status: None (Preliminary result)   Collection Time: 03/30/24 12:37 PM   Specimen: BLOOD  Result Value Ref Range Status   Specimen Description BLOOD BLOOD RIGHT ARM   Final   Special Requests   Final    BOTTLES DRAWN AEROBIC ONLY Blood Culture adequate volume   Culture   Final    NO GROWTH 4 DAYS Performed at [facility], [address] [place], [place] [address]    Report Status PENDING  Incomplete  Blood Culture (routine x 2)     Status: None (Preliminary result)   Collection Time: 03/30/24  1:00 PM   Specimen: BLOOD  Result Value Ref Range Status   Specimen Description BLOOD BLOOD LEFT ARM  Final   Special Requests   Final    BOTTLES DRAWN AEROBIC AND ANAEROBIC Blood Culture results may not be optimal due to an inadequate volume of blood received in culture bottles   Culture   Final    NO GROWTH 4 DAYS Performed at [facility], [address], [place], [place] [address]    Report Status PENDING  Incomplete  Resp panel by RT-PCR (RSV,  Flu A&B, Covid) Urine, Clean Catch     Status: None   Collection Time: 03/30/24  1:57 PM   Specimen: Urine, Clean Catch; Nasal Swab  Result Value Ref Range Status   SARS Coronavirus 2 by RT PCR NEGATIVE NEGATIVE Final    Comment: (NOTE) SARS-CoV-2 target nucleic acids are NOT DETECTED.  The SARS-CoV-2 RNA is generally detectable in upper respiratory specimens during the acute phase of infection. The lowest concentration of SARS-CoV-2 viral copies this assay can detect is 138 copies/mL. A negative result does not preclude SARS-Cov-2 infection and should not be used as the sole basis for treatment or other patient management decisions. A negative result may occur with  improper specimen collection/handling, submission of specimen other than nasopharyngeal swab, presence of viral mutation(s) within the areas targeted by this assay, and inadequate number of viral copies(<138 copies/mL). A negative result must be combined with clinical observations, patient history, and epidemiological information. The expected result is Negative.  Fact Sheet for Patients:  bloggercourse.com  Fact  Sheet for Healthcare Providers:  seriousbroker.it  This test is no t yet approved or cleared by the [place]  FDA and  has been authorized for detection and/or diagnosis of SARS-CoV-2 by FDA under an Emergency Use Authorization (EUA). This EUA will remain  in effect (meaning this test can be used) for the duration of the COVID-19 declaration under Section 564(b)(1) of the Act, 21 U.S.C.section 360bbb-3(b)(1), unless the authorization is terminated  or revoked sooner.       Influenza A by PCR NEGATIVE NEGATIVE Final   Influenza B by PCR NEGATIVE NEGATIVE Final    Comment: (NOTE) The Xpert Xpress SARS-CoV-2/FLU/RSV plus assay is intended as an aid in the diagnosis of influenza from Nasopharyngeal swab specimens and should not be used as a sole basis for treatment. Nasal washings and aspirates are unacceptable for Xpert Xpress SARS-CoV-2/FLU/RSV testing.  Fact Sheet for Patients: bloggercourse.com  Fact Sheet for Healthcare Providers: seriousbroker.it  This test is not yet approved or cleared by the [place]  FDA and has been authorized for detection and/or diagnosis of SARS-CoV-2 by FDA under an Emergency Use Authorization (EUA). This EUA will remain in effect (meaning this test can be used) for the duration of the COVID-19 declaration under Section 564(b)(1) of the Act, 21 U.S.C. section 360bbb-3(b)(1), unless the authorization is terminated or revoked.     Resp Syncytial Virus by PCR NEGATIVE NEGATIVE Final    Comment: (NOTE) Fact Sheet for Patients: bloggercourse.com  Fact Sheet for Healthcare Providers: seriousbroker.it  This test is not yet approved or cleared by the [place]  FDA and has been authorized for detection and/or diagnosis of SARS-CoV-2 by FDA under an Emergency Use Authorization (EUA). This EUA will remain in effect  (meaning this test can be used) for the duration of the COVID-19 declaration under Section 564(b)(1) of the Act, 21 U.S.C. section 360bbb-3(b)(1), unless the authorization is terminated or revoked.  Performed at [facility], [address] [place], [place] [address]   MRSA Next Gen by PCR, Nasal     Status: None   Collection Time: 03/31/24  4:00 AM   Specimen: Nasal Mucosa; Nasal Swab  Result Value Ref Range Status   MRSA by PCR Next Gen NOT DETECTED NOT DETECTED Final    Comment: (NOTE) The GeneXpert MRSA Assay (FDA approved for NASAL specimens only), is one component of a comprehensive MRSA colonization surveillance program. It is not intended to diagnose MRSA infection nor to guide or monitor treatment  for MRSA infections. Test performance is not FDA approved in patients less than 60 years old. Performed at [facility], [address], [place], [place] [address]      Radiology Studies: No results found.   Scheduled Meds:  aspirin   81 mg Oral Daily   Chlorhexidine  Gluconate Cloth  6 each Topical Daily   feeding supplement  237 mL Oral BID BM   heparin   5,000 Units Subcutaneous Q8H   lidocaine   1 patch Transdermal Q24H   midodrine   15 mg Oral TID WC   pregabalin   25 mg Oral Daily   rosuvastatin   20 mg Oral Daily   tamsulosin   0.4 mg Oral QPC supper   valACYclovir   500 mg Oral BID   Continuous Infusions:   LOS: 4 days   Critical Care Procedure Note Authorized and Performed by: KYM Louder MD  Total Critical Care time:  50 mins Due to a high probability of clinically significant, life threatening deterioration, the patient required my highest level of preparedness to intervene emergently and I personally spent this critical care time directly and personally managing the patient.  This critical care time included obtaining a history; examining the patient, pulse oximetry; ordering and review of studies; arranging urgent treatment with development of a management plan;  evaluation of patient's response of treatment; frequent reassessment; and discussions with other providers.  This critical care time was performed to assess and manage the high probability of imminent and life threatening deterioration that could result in multi-organ failure.  It was exclusive of separately billable procedures and treating other patients and teaching time.   Afton Louder, MD How to contact the [facility] Attending or Consulting provider 7A - 7P or covering provider during after hours 7P -7A, for this patient?  Check the care team in [facility] and look for a) attending/consulting [facility] provider listed and b) the [facility] team listed Log into [email or web address] to find provider on call.  Locate the [facility] provider you are looking for under [facility] and page to a number that you can be directly reached. If you still have difficulty reaching the provider, please page the [facility] (Director on Call) for the Hospitalists listed on amion for assistance.  04/03/2024, 11:18 AM    "

## 2024-04-04 ENCOUNTER — Ambulatory Visit: Admitting: Internal Medicine

## 2024-04-04 DIAGNOSIS — B029 Zoster without complications: Secondary | ICD-10-CM | POA: Diagnosis not present

## 2024-04-04 DIAGNOSIS — I5020 Unspecified systolic (congestive) heart failure: Secondary | ICD-10-CM | POA: Diagnosis not present

## 2024-04-04 DIAGNOSIS — I447 Left bundle-branch block, unspecified: Secondary | ICD-10-CM | POA: Diagnosis not present

## 2024-04-04 DIAGNOSIS — R570 Cardiogenic shock: Secondary | ICD-10-CM | POA: Diagnosis not present

## 2024-04-04 DIAGNOSIS — R579 Shock, unspecified: Secondary | ICD-10-CM | POA: Diagnosis not present

## 2024-04-04 DIAGNOSIS — I110 Hypertensive heart disease with heart failure: Secondary | ICD-10-CM | POA: Diagnosis not present

## 2024-04-04 DIAGNOSIS — Z515 Encounter for palliative care: Secondary | ICD-10-CM | POA: Diagnosis not present

## 2024-04-04 LAB — CULTURE, BLOOD (ROUTINE X 2)
Culture: NO GROWTH
Culture: NO GROWTH
Special Requests: ADEQUATE

## 2024-04-04 MED ORDER — DOCUSATE SODIUM 50 MG PO CAPS: 50.0000 mg | ORAL_CAPSULE | Freq: Once | ORAL | Status: DC | PRN

## 2024-04-04 NOTE — Progress Notes (Signed)
 Physical Therapy Treatment Patient Details Name: [name] MRN: [id number] DOB: [birth year] Today's Date: 04/04/2024   History of Present Illness [name]  is a 89 y.o. male, with a history of asthma, hypertension, gouty arthritis, and hyperlipidemia , systolic CHF with EF 20 to 25%, recurrent right pleural effusion due to CHF.  - Patient presents to ED secondary to shortness of breath and weakness, as well as inability to urinate, send with temperature effusion, hypoxia placed on yesterday, reports this morning still with significant dyspnea, he denies any fever, chills, reports his chronic cough unchanged.    PT Comments  Patient demonstrates fair/good return for sitting up at bedside with [facility] raised, but at high risk for falls when taking steps using RW due to buckling of knees, frequent scissoring of legs and limited to taking a few steps at bedside before requesting to sit due to fatigue. Patient on room air during visit with SpO2 at 94% - RN notified. Patient will benefit from continued skilled physical therapy in hospital and recommended venue below to increase strength, balance, endurance for safe ADLs and gait.     If plan is discharge home, recommend the following: A lot of help with bathing/dressing/bathroom;A lot of help with walking and/or transfers;Help with stairs or ramp for entrance;Assist for transportation;Assistance with cooking/housework   Can travel by private vehicle     Yes  Equipment Recommendations  None recommended by PT    Recommendations for Other Services       Precautions / Restrictions Precautions Precautions: Fall Recall of Precautions/Restrictions: Intact Restrictions Weight Bearing Restrictions Per Provider Order: No     Mobility  Bed Mobility Overal bed mobility: Needs Assistance Bed Mobility: Sit to Supine       Sit to supine: Contact guard assist, HOB elevated   General bed mobility comments: improvement for sitting up at  bedside with [facility] raised    Transfers Overall transfer level: Needs assistance Equipment used: Rolling walker (2 wheels) Transfers: Sit to/from Stand, Bed to chair/wheelchair/BSC Sit to Stand: Min assist   Step pivot transfers: Mod assist       General transfer comment: unsteady labored movement with frequent scissoring of legs, buckling of knees due to weakness    Ambulation/Gait Ambulation/Gait assistance: Mod assist Gait Distance (Feet): 6 Feet Assistive device: Rollator (4 wheels) Gait Pattern/deviations: Decreased step length - right, Decreased step length - left, Decreased stride length, Trunk flexed, Scissoring, Knees buckling Gait velocity: slow     General Gait Details: limited to a few steps at bedside due to buckling of knees and difficulty advancing legs due to weakness   Stairs             Wheelchair Mobility     Tilt Bed    Modified Rankin (Stroke Patients Only)       Balance Overall balance assessment: Needs assistance Sitting-balance support: Feet supported, No upper extremity supported Sitting balance-Leahy Scale: Fair Sitting balance - Comments: fair/good seated at EOB   Standing balance support: Reliant on assistive device for balance, During functional activity, Bilateral upper extremity supported Standing balance-Leahy Scale: Poor Standing balance comment: using RW                            Communication Communication Factors Affecting Communication: Hearing impaired;Other (comment)  Cognition Arousal: Alert Behavior During Therapy: WFL for tasks assessed/performed  Following commands: Intact      Cueing Cueing Techniques: Verbal cues, Tactile cues  Exercises General Exercises - Lower Extremity Ankle Circles/Pumps: Seated, Strengthening, Both, 10 reps Long Arc Quad: Seated, AROM, Strengthening, Both, 10 reps Hip Flexion/Marching: Seated, AROM, Strengthening, Both, 10 reps     General Comments        Pertinent Vitals/Pain Pain Assessment Pain Assessment: No/denies pain    Home Living                          Prior Function            PT Goals (current goals can now be found in the care plan section) Acute Rehab PT Goals Patient Stated Goal: return home after rehab PT Goal Formulation: With patient/family Time For Goal Achievement: 04/14/24 Potential to Achieve Goals: Good Progress towards PT goals: Progressing toward goals    Frequency    Min 3X/week      PT Plan      Co-evaluation              AM-PAC PT 6 Clicks Mobility   Outcome Measure  Help needed turning from your back to your side while in a flat bed without using bedrails?: A Little Help needed moving from lying on your back to sitting on the side of a flat bed without using bedrails?: A Little Help needed moving to and from a bed to a chair (including a wheelchair)?: A Lot Help needed standing up from a chair using your arms (e.g., wheelchair or bedside chair)?: A Lot Help needed to walk in hospital room?: A Lot Help needed climbing 3-5 steps with a railing? : A Lot 6 Click Score: 14    End of Session   Activity Tolerance: Patient tolerated treatment well;Patient limited by fatigue Patient left: in chair;with call bell/phone within reach Nurse Communication: Mobility status PT Visit Diagnosis: Unsteadiness on feet (R26.81);Other abnormalities of gait and mobility (R26.89);Muscle weakness (generalized) (M62.81)     Time: 8494-8469 PT Time Calculation (min) (ACUTE ONLY): 25 min  Charges:    $Therapeutic Exercise: 8-22 mins $Therapeutic Activity: 8-22 mins PT General Charges $$ ACUTE PT VISIT: 1 Visit                     3:57 PM, 04/04/24 Lynwood Music, MPT Physical Therapist with [facility] [phone] office [phone] mobile phone

## 2024-04-04 NOTE — Plan of Care (Signed)
" °  Problem: Fluid Volume: Goal: Hemodynamic stability will improve Outcome: Adequate for Discharge   Problem: Clinical Measurements: Goal: Diagnostic test results will improve Outcome: Adequate for Discharge Goal: Signs and symptoms of infection will decrease Outcome: Progressing   Problem: Respiratory: Goal: Ability to maintain adequate ventilation will improve Outcome: Adequate for Discharge   Problem: Education: Goal: Knowledge of General Education information will improve Description: Including pain rating scale, medication(s)/side effects and non-pharmacologic comfort measures Outcome: Not Applicable   Problem: Health Behavior/Discharge Planning: Goal: Ability to manage health-related needs will improve Outcome: Not Applicable   Problem: Clinical Measurements: Goal: Ability to maintain clinical measurements within normal limits will improve Outcome: Progressing Goal: Will remain free from infection Outcome: Progressing Goal: Diagnostic test results will improve Outcome: Progressing Goal: Respiratory complications will improve Outcome: Progressing Goal: Cardiovascular complication will be avoided Outcome: Progressing   Problem: Activity: Goal: Risk for activity intolerance will decrease Outcome: Progressing   Problem: Nutrition: Goal: Adequate nutrition will be maintained Outcome: Progressing   Problem: Coping: Goal: Level of anxiety will decrease Outcome: Not Applicable   Problem: Elimination: Goal: Will not experience complications related to bowel motility Outcome: Progressing Goal: Will not experience complications related to urinary retention Outcome: Progressing   Problem: Pain Managment: Goal: General experience of comfort will improve and/or be controlled Outcome: Progressing   Problem: Safety: Goal: Ability to remain free from injury will improve Outcome: Progressing   Problem: Skin Integrity: Goal: Risk for impaired skin integrity will  decrease Outcome: Progressing   "

## 2024-04-04 NOTE — Progress Notes (Signed)
 " PROGRESS NOTE   [name]  FMW:[id number] DOB: December 11, 1925 DOA: 03/30/2024 PCP: Alphonsa Glendia LABOR, MD   Chief Complaint  Patient presents with   Shortness of Breath   Level of care: Stepdown  Brief Admission History:  89 y.o. male, with a history of asthma, hypertension, gouty arthritis, and hyperlipidemia , systolic CHF with EF 20 to 25%, recurrent right pleural effusion due to CHF. - Patient presents to ED secondary to shortness of breath and weakness, as well as inability to urinate, send with temperature effusion, hypoxia placed on yesterday, reports this morning still with significant dyspnea, he denies any fever, chills, reports his chronic cough unchanged. - in ED  his workup was significant for small pleural effusion, pulmonary elevated at 94, lactic acid of 3.6, creatinine at baseline 1.44, to have retention, Foley catheter inserted with 600 cc of immediate urine output, was hypotensive 76/59, he responded to fluid bolus, chest x-ray significant for mild left pneumothorax left lung base opacity, [facility] hospitalist consulted to admit.   Assessment and Plan:  Cardiogenic Shock - Pt presented Hypotensive, with elevated lactic acid, tachycardic - He had an excellent response to fluid, but for resuscitation is very limited due to heart failure, DC IV fluid today - procalcitonin 0.16 very low likelihood of bacterial infection, DC antibiotics  - known EF 20 to 25%, I have discussed this with daughter, please see discussion under goals of care, currently we are limited, and advanced age, gentle hydration will monitor closely, continue on midodrine  for hypotension, not a candidate for advanced therapies. - Hold antihypertensive medications until BP recovers - discussion with daughter POA that he qualifies for hospice and she verbalized understanding and agreeable to hospice  - palliative team consulted to help coordinate getting him hospice care arranged   Hypotension - he has  required scheduled midodrine  15 mg TID to maintain BPs - after discussion with family plan is not to escalate care further with plans to transition to hospice care    Shingles--[facility] - Left chest area in a dermatomal pattern; continue valtrex , contact/airborne isolation - starting pregabalin  25 mg daily for pain associated with shingles - added lidocaine  patch 5% for pain symptoms   Lactic acidosis--RESOLVED  -- resolved with IV fluid treatment  Urinary retention  - Foley catheter inserted, started on Flomax   - failed voiding trial, re-insert foley 1/19   CKD stage IIIb - Creatinine at baseline   SVT - Patient had SVT while in the ED, converted to NSR without intervention   Acute on chronic diastolic CHF>> with cardiogenic shock - EF 20 to 25% - Hold GDMT medications including Aldactone , Farxiga  and losartan  given low blood pressure   Recurrent Left pleural effusion Pneumothorax - S/p thoracentesis 03/29/24 where 1.8 L fluid was drained, this is most likely in the setting of CHF, no labs were sent on that effusion. - Chest x-ray with small pneumothorax, ED discussed with Dr. Annella, who recommended to start on oxygen  and repeat x-ray in a.m. - repeat CXR 1/16 no findings of pneumothorax reported   Failure to thrive - Continue with mirtazapine  - supplements - anticipate transition to hospice care    Goals of care  -Discussed with daughters including HCPOA, who is an [employer], continue with current measures with no escalation of care, regarding low EF she understands he is not a candidate for advanced therapies, we will hold his antihypertensive medications, started on midodrine , with no escalation of care, no central lines, if he decompensates/deteriorates despite  that then plan to proceed with full comfort measures, palliative medicine consulted  DVT prophylaxis: sq heparin   Code Status: DNR  Family Communication:  Disposition: TBD    Consultants:  Palliative care    Procedures:   Antimicrobials:    Subjective: Pt continues to have symptoms from herpes zoster lesions.     Objective: Vitals:   04/04/24 0400 04/04/24 0500 04/04/24 0600 04/04/24 0700  BP: 113/69 112/69 114/64 127/65  Pulse: 73 72 72 73  Resp: 12 20 (!) 23 (!) 23  Temp:    (!) 97.3 F (36.3 C)  TempSrc:    Oral  SpO2: 99% 99% 99% 100%  Weight:      Height:        Intake/Output Summary (Last 24 hours) at 04/04/2024 1058 Last data filed at 04/04/2024 0600 Gross per 24 hour  Intake --  Output 850 ml  Net -850 ml   Filed Weights   03/30/24 1139 03/31/24 0400  Weight: 65.8 kg 62.2 kg   Examination:  General exam: frail, elderly male, Appears calm and comfortable  Respiratory system: diminished BS LLL Cardiovascular system: normal S1 & S2 heard. No JVD, murmurs, rubs, gallops or clicks. 1-2+ pedal edema. Gastrointestinal system: Abdomen is nondistended, soft and nontender. No organomegaly or masses felt. Normal bowel sounds heard. Central nervous system: Alert and oriented. No focal neurological deficits. Extremities: 1+ pretibial edema BLEs, Symmetric 5 x 5 power. Skin: shigles lesions left side in dermatomal pattern - dried up lesions. Psychiatry: Judgement and insight appear normal. Mood & affect appropriate.   Data Reviewed: I have personally reviewed following labs and imaging studies  CBC: Recent Labs  Lab 03/30/24 1237 03/30/24 1802 03/31/24 0524 04/01/24 0551  WBC 5.6 5.0 10.0 7.0  NEUTROABS 4.4 4.0  --   --   HGB 10.7* 10.6* 10.8* 9.1*  HCT 36.5* 37.5* 35.9* 30.6*  MCV 103.1* 106.8* 102.0* 102.7*  PLT 160 141* 156 141*    Basic Metabolic Panel: Recent Labs  Lab 03/30/24 1237 03/30/24 1802 03/31/24 0524 04/01/24 0551  NA 139 139 140 139  K 3.8 3.4* 3.4* 3.8  CL 101 101 101 104  CO2 21* 21* 24 25  GLUCOSE 99 97 82 85  BUN 18 18 19 17   CREATININE 1.44* 1.32* 1.15 1.01  CALCIUM  9.3 8.9 8.9 8.4*  MG  --   --   --  2.2    CBG: Recent Labs   Lab 03/31/24 2217  GLUCAP 111*    Recent Results (from the past 240 hours)  Blood Culture (routine x 2)     Status: None   Collection Time: 03/30/24 12:37 PM   Specimen: BLOOD  Result Value Ref Range Status   Specimen Description BLOOD BLOOD RIGHT ARM  Final   Special Requests   Final    BOTTLES DRAWN AEROBIC ONLY Blood Culture adequate volume   Culture   Final    NO GROWTH 5 DAYS Performed at [facility], [address], [place], [place] [address]    Report Status 04/04/2024 FINAL  Final  Blood Culture (routine x 2)     Status: None   Collection Time: 03/30/24  1:00 PM   Specimen: BLOOD  Result Value Ref Range Status   Specimen Description BLOOD BLOOD LEFT ARM  Final   Special Requests   Final    BOTTLES DRAWN AEROBIC AND ANAEROBIC Blood Culture results may not be optimal due to an inadequate volume of blood received in culture bottles  Culture   Final    NO GROWTH 5 DAYS Performed at [facility], [address], [place], [place] [address]    Report Status 04/04/2024 FINAL  Final  Resp panel by RT-PCR (RSV, Flu A&B, Covid) Urine, Clean Catch     Status: None   Collection Time: 03/30/24  1:57 PM   Specimen: Urine, Clean Catch; Nasal Swab  Result Value Ref Range Status   SARS Coronavirus 2 by RT PCR NEGATIVE NEGATIVE Final    Comment: (NOTE) SARS-CoV-2 target nucleic acids are NOT DETECTED.  The SARS-CoV-2 RNA is generally detectable in upper respiratory specimens during the acute phase of infection. The lowest concentration of SARS-CoV-2 viral copies this assay can detect is 138 copies/mL. A negative result does not preclude SARS-Cov-2 infection and should not be used as the sole basis for treatment or other patient management decisions. A negative result may occur with  improper specimen collection/handling, submission of specimen other than nasopharyngeal swab, presence of viral mutation(s) within the areas targeted by this assay, and inadequate number of  viral copies(<138 copies/mL). A negative result must be combined with clinical observations, patient history, and epidemiological information. The expected result is Negative.  Fact Sheet for Patients:  bloggercourse.com  Fact Sheet for Healthcare Providers:  seriousbroker.it  This test is no t yet approved or cleared by the [place]  FDA and  has been authorized for detection and/or diagnosis of SARS-CoV-2 by FDA under an Emergency Use Authorization (EUA). This EUA will remain  in effect (meaning this test can be used) for the duration of the COVID-19 declaration under Section 564(b)(1) of the Act, 21 U.S.C.section 360bbb-3(b)(1), unless the authorization is terminated  or revoked sooner.       Influenza A by PCR NEGATIVE NEGATIVE Final   Influenza B by PCR NEGATIVE NEGATIVE Final    Comment: (NOTE) The Xpert Xpress SARS-CoV-2/FLU/RSV plus assay is intended as an aid in the diagnosis of influenza from Nasopharyngeal swab specimens and should not be used as a sole basis for treatment. Nasal washings and aspirates are unacceptable for Xpert Xpress SARS-CoV-2/FLU/RSV testing.  Fact Sheet for Patients: bloggercourse.com  Fact Sheet for Healthcare Providers: seriousbroker.it  This test is not yet approved or cleared by the [place]  FDA and has been authorized for detection and/or diagnosis of SARS-CoV-2 by FDA under an Emergency Use Authorization (EUA). This EUA will remain in effect (meaning this test can be used) for the duration of the COVID-19 declaration under Section 564(b)(1) of the Act, 21 U.S.C. section 360bbb-3(b)(1), unless the authorization is terminated or revoked.     Resp Syncytial Virus by PCR NEGATIVE NEGATIVE Final    Comment: (NOTE) Fact Sheet for Patients: bloggercourse.com  Fact Sheet for Healthcare  Providers: seriousbroker.it  This test is not yet approved or cleared by the [place]  FDA and has been authorized for detection and/or diagnosis of SARS-CoV-2 by FDA under an Emergency Use Authorization (EUA). This EUA will remain in effect (meaning this test can be used) for the duration of the COVID-19 declaration under Section 564(b)(1) of the Act, 21 U.S.C. section 360bbb-3(b)(1), unless the authorization is terminated or revoked.  Performed at [facility], [address], [place], [place] [address]   MRSA Next Gen by PCR, Nasal     Status: None   Collection Time: 03/31/24  4:00 AM   Specimen: Nasal Mucosa; Nasal Swab  Result Value Ref Range Status   MRSA by PCR Next Gen NOT DETECTED NOT DETECTED Final  Comment: (NOTE) The GeneXpert MRSA Assay (FDA approved for NASAL specimens only), is one component of a comprehensive MRSA colonization surveillance program. It is not intended to diagnose MRSA infection nor to guide or monitor treatment for MRSA infections. Test performance is not FDA approved in patients less than 62 years old. Performed at [facility], [address] [place], [place] [address]      Radiology Studies: No results found.   Scheduled Meds:  aspirin   81 mg Oral Daily   Chlorhexidine  Gluconate Cloth  6 each Topical Daily   feeding supplement  237 mL Oral BID BM   heparin   5,000 Units Subcutaneous Q8H   lidocaine   1 patch Transdermal Q24H   midodrine   15 mg Oral TID WC   pregabalin   25 mg Oral Daily   rosuvastatin   20 mg Oral Daily   tamsulosin   0.4 mg Oral QPC supper   valACYclovir   1,000 mg Oral BID   Continuous Infusions:   LOS: 5 days   Critical Care Procedure Note Authorized and Performed by: KYM Louder MD  Total Critical Care time:  54 mins Due to a high probability of clinically significant, life threatening deterioration, the patient required my highest level of preparedness to intervene emergently and I  personally spent this critical care time directly and personally managing the patient.  This critical care time included obtaining a history; examining the patient, pulse oximetry; ordering and review of studies; arranging urgent treatment with development of a management plan; evaluation of patient's response of treatment; frequent reassessment; and discussions with other providers.  This critical care time was performed to assess and manage the high probability of imminent and life threatening deterioration that could result in multi-organ failure.  It was exclusive of separately billable procedures and treating other patients and teaching time.   Afton Louder, MD How to contact the [facility] Attending or Consulting provider 7A - 7P or covering provider during after hours 7P -7A, for this patient?  Check the care team in [facility] and look for a) attending/consulting [facility] provider listed and b) the [facility] team listed Log into [email or web address] to find provider on call.  Locate the [facility] provider you are looking for under [facility] and page to a number that you can be directly reached. If you still have difficulty reaching the provider, please page the [facility] (Director on Call) for the Hospitalists listed on amion for assistance.  04/04/2024, 10:58 AM    "

## 2024-04-04 NOTE — Progress Notes (Signed)
 Palliative: Chart review completed.  Face-to-face discussion with bedside nursing staff and transition of care team.   [name] is sitting up in bed.  He appears chronically ill and somewhat frail.  He is very hard of hearing, but is able to tell me his name.  He is able to feed himself.  I do believe that he can make his basic needs known.  There is no family at bedside at this time.  Call to daughter, [name].  We talked about [name] improvements and the goal of discharge.  [name] states that family still desires rehab for [name].  She states that if they do not receive a rehab bed offer, they would need time to set up help at home.  [name] shares that [name] have been living alone prior to this illness, but due to his functional declines, the family feels he needs rehab.  She shares that family was, in and out with people sitting with [name] for several hours at a time.   Conference with attending, bedside nursing staff, transition of care team related to patient condition, needs, goals of care, disposition.  Plan: Continue to treat the treatable but no CPR or intubation.  Still seeking short-term rehab if bed is offered.  If no rehab bed offered, family states they would need time to set up caregiving at home. DNR/goldenrod form on chart  50 minutes Brexlee Heberlein, NP Palliative medicine team Team phone [phone]

## 2024-04-05 DIAGNOSIS — I5020 Unspecified systolic (congestive) heart failure: Secondary | ICD-10-CM | POA: Diagnosis not present

## 2024-04-05 DIAGNOSIS — J95811 Postprocedural pneumothorax: Secondary | ICD-10-CM | POA: Diagnosis not present

## 2024-04-05 DIAGNOSIS — R579 Shock, unspecified: Secondary | ICD-10-CM | POA: Diagnosis not present

## 2024-04-05 DIAGNOSIS — R338 Other retention of urine: Secondary | ICD-10-CM

## 2024-04-05 DIAGNOSIS — R571 Hypovolemic shock: Secondary | ICD-10-CM | POA: Diagnosis not present

## 2024-04-05 NOTE — Plan of Care (Signed)
 Patient doing well today.  Was up to bed and vitals remain stable.  Will move patient up to floor

## 2024-04-05 NOTE — TOC Progression Note (Signed)
 Transition of Care [facility]) - Progression Note    Patient Details  Name: [name] MRN: [id number] Date of Birth: 07-18-1925  [facility] CM/SW Contact  Sharlyne Stabs, RN Phone Number: 04/05/2024, 3:56 PM  Clinical Narrative:   No bed offer until Valtrex  is completed and patient is off isolation. Family want SNF. Offers pending. IPCM following will update facilities in the morning to get an offer.    Expected Discharge Plan: Skilled Nursing Facility Barriers to Discharge: Continued Medical Work up, SNF Pending bed offer    Expected Discharge Plan and Services     Post Acute Care Choice: Skilled Nursing Facility Living arrangements for the past 2 months: Single Family Home             Social Drivers of Health (SDOH) Interventions SDOH Screenings   Food Insecurity: Patient Unable To Answer (03/31/2024)  Housing: Patient Unable To Answer (03/31/2024)  Transportation Needs: Patient Unable To Answer (03/31/2024)  Utilities: Patient Unable To Answer (03/31/2024)  Depression (PHQ2-9): Low Risk (02/02/2024)  Financial Resource Strain: Low Risk (03/18/2022)  Social Connections: Patient Unable To Answer (03/31/2024)  Tobacco Use: Medium Risk (04/03/2024)    Readmission Risk Interventions    03/13/2022    2:05 PM  Readmission Risk Prevention Plan  Post Dischage Appt Not Complete  Medication Screening Complete  Transportation Screening Complete

## 2024-04-05 NOTE — Progress Notes (Signed)
 "          PROGRESS NOTE  [name] FMW:[id number] DOB: 1925/12/08 DOA: 03/30/2024 PCP: Alphonsa Glendia LABOR, MD  Brief History:  89 y.o. male, with a history of asthma, hypertension, gouty arthritis, and hyperlipidemia , systolic CHF with EF 20 to 25%, recurrent right pleural effusion due to CHF. - Patient presents to ED secondary to shortness of breath and weakness, as well as inability to urinate, pleural effusion, hypoxia placed on oxygen  on the day prior to admission.  Hereports this morning still with significant dyspnea, he denies any fever, chills, reports his chronic cough unchanged. - in ED  his workup was significant for small pleural effusion, pulmonary elevated at 94, lactic acid of 3.6, creatinine at baseline 1.44, to have retention, Foley catheter inserted with 600 cc of immediate urine output, was hypotensive 76/59, he responded to fluid bolus, chest x-ray significant for mild left pneumothorax left lung base opacity, [facility] consulted to admit. Reviewed chest x-ray showed resolution of his pneumothorax.  The patient was noted to be hypotension.  He was fluid resuscitated with improvement of his blood pressure.  The patient was started on Valtrex  for shingles on around his left flank area.  The patient finished a full course of Valtrex  on the evening of 04/05/2024. Overall, the patient's shingles lesions have involuted without any open blisters or draining wounds.  His pain has completely resolved.  Ultimately, the patient was removed off of airborne isolation on 04/06/2024.   Assessment/Plan: Hypovolemic shock - Pt presented Hypotensive, with elevated lactic acid, tachycardic - He had an excellent response to fluid - procalcitonin 0.16 very low likelihood of bacterial infection, DC antibiotics  -The patient remained afebrile -His blood pressures remained soft>> midodrine  was started - 01/18/2024 echo EF 20 to 25%, I have discussed this with daughter, please see  discussion under goals of care, currently we are limited, and advanced age, gentle hydration will monitor closely, continue on midodrine  for hypotension, not a candidate for advanced therapies. - Hold antihypertensive medications until BP recovers - discussion with daughter POA that he qualifies for hospice and she verbalized understanding and agreeable to hospice  - palliative team consulted to help coordinate getting him hospice care arranged    Hypotension - he has required scheduled midodrine  15 mg TID to maintain BPs - after discussion with family plan is not to escalate care further with plans to transition to hospice care    Shingles--LESIONS have ambulated without open blisters or draining wounds - Left chest area in a dermatomal pattern - Last dose of Valtrex  04/05/2024 evening -Discontinue airborne isolation 04/06/2024 AM - starting pregabalin  25 mg daily for pain associated with shingles - added lidocaine  patch 5% for pain symptoms   Lactic acidosis--RESOLVED  -- resolved with IV fluid treatment   Urinary retention  - Foley catheter inserted, started on Flomax   - failed voiding trial, re-insert foley 1/19   CKD stage IIIb - Creatinine at baseline -Baseline creatinine 1.1-1.4   SVT - Patient had SVT while in the ED, converted to NSR without intervention   chronic diastolic CHF>> with cardiogenic shock - 01/18/2024 echo EF 20-25%, normal RVF, - Hold GDMT medications including Aldactone , Farxiga  and losartan  given low blood pressure   Recurrent Left pleural effusion Pneumothorax - S/p thoracentesis 03/29/24 where 1.8 L fluid was drained, this is most likely in the setting of CHF, no labs were sent on that effusion. - Chest x-ray with small pneumothorax, ED discussed with Dr. Annella,  who recommended to start on oxygen  and repeat x-ray in a.m. - repeat CXR 1/16 no findings of pneumothorax reported   Failure to thrive - Continue with mirtazapine  - supplements -  anticipate transition to hospice care    Goals of care  -Discussed with daughters including HCPOA, who is an [employer], continue with current measures with no escalation of care, regarding low EF she understands he is not a candidate for advanced therapies, we will hold his antihypertensive medications, started on midodrine , with no escalation of care, no central lines, if he decompensates/deteriorates despite that then plan to proceed with full comfort measures, palliative medicine consulted        Family Communication:  no Family at bedside  Consultants:  palliative  Code Status:  DNR  DVT Prophylaxis:  [place] Heparin     Procedures: As Listed in Progress Note Above  Antibiotics: None       Subjective: Patient denies fevers, chills, headache, chest pain, dyspnea, nausea, vomiting, diarrhea, abdominal pain, dysuria, hematuria, hematochezia, and melena.   Objective: Vitals:   04/05/24 1300 04/05/24 1400 04/05/24 1500 04/05/24 1600  BP: 114/82 (!) 87/44 97/68 (!) 102/59  Pulse: 82 77 79 81  Resp: (!) 24 (!) 21 17 (!) 21  Temp: 97.8 F (36.6 C)     TempSrc:      SpO2: 98% 98% 100% 98%  Weight:      Height:        Intake/Output Summary (Last 24 hours) at 04/05/2024 1759 Last data filed at 04/05/2024 1700 Gross per 24 hour  Intake 480 ml  Output 875 ml  Net -395 ml   Weight change:  Exam:  General:  Pt is alert, follows commands appropriately, not in acute distress HEENT: No icterus, No thrush, No neck mass, [place]/AT Cardiovascular: RRR, S1/S2, no rubs, no gallops Respiratory: CTA bilaterally, no wheezing, no crackles, no rhonchi Abdomen: Soft/+BS, non tender, non distended, no guarding Extremities: No edema, No lymphangitis, No petechiae, No rashes, no synovitis   Data Reviewed: I have personally reviewed following labs and imaging studies Basic Metabolic Panel: Recent Labs  Lab 03/30/24 1237 03/30/24 1802 03/31/24 0524 04/01/24 0551  NA 139 139 140 139  K 3.8  3.4* 3.4* 3.8  CL 101 101 101 104  CO2 21* 21* 24 25  GLUCOSE 99 97 82 85  BUN 18 18 19 17   CREATININE 1.44* 1.32* 1.15 1.01  CALCIUM  9.3 8.9 8.9 8.4*  MG  --   --   --  2.2   Liver Function Tests: Recent Labs  Lab 03/30/24 1237 03/30/24 1802  AST 23 19  ALT <5 <5  ALKPHOS 67 63  BILITOT 1.9* 1.7*  PROT 7.4 6.9  ALBUMIN 3.3* 3.1*   No results for input(s): LIPASE, AMYLASE in the last 168 hours. No results for input(s): AMMONIA in the last 168 hours. Coagulation Profile: Recent Labs  Lab 03/30/24 1300 03/30/24 1943  INR 1.3* 1.3*   CBC: Recent Labs  Lab 03/30/24 1237 03/30/24 1802 03/31/24 0524 04/01/24 0551  WBC 5.6 5.0 10.0 7.0  NEUTROABS 4.4 4.0  --   --   HGB 10.7* 10.6* 10.8* 9.1*  HCT 36.5* 37.5* 35.9* 30.6*  MCV 103.1* 106.8* 102.0* 102.7*  PLT 160 141* 156 141*   Cardiac Enzymes: No results for input(s): CKTOTAL, CKMB, CKMBINDEX, TROPONINI in the last 168 hours. BNP: Invalid input(s): POCBNP CBG: Recent Labs  Lab 03/31/24 2217  GLUCAP 111*   HbA1C: No results for input(s): HGBA1C in  the last 72 hours. Urine analysis:    Component Value Date/Time   COLORURINE YELLOW 03/30/2024 1357   APPEARANCEUR CLEAR 03/30/2024 1357   LABSPEC 1.010 03/30/2024 1357   PHURINE 6.0 03/30/2024 1357   GLUCOSEU >=500 (A) 03/30/2024 1357   HGBUR SMALL (A) 03/30/2024 1357   BILIRUBINUR NEGATIVE 03/30/2024 1357   BILIRUBINUR moderate (A) 10/14/2022 1119   KETONESUR NEGATIVE 03/30/2024 1357   PROTEINUR 30 (A) 03/30/2024 1357   UROBILINOGEN 0.2 10/14/2022 1119   NITRITE NEGATIVE 03/30/2024 1357   LEUKOCYTESUR NEGATIVE 03/30/2024 1357   Sepsis Labs: @LABRCNTIP (procalcitonin:4,lacticidven:4) ) Recent Results (from the past 240 hours)  Blood Culture (routine x 2)     Status: None   Collection Time: 03/30/24 12:37 PM   Specimen: BLOOD  Result Value Ref Range Status   Specimen Description BLOOD BLOOD RIGHT ARM  Final   Special Requests   Final     BOTTLES DRAWN AEROBIC ONLY Blood Culture adequate volume   Culture   Final    NO GROWTH 5 DAYS Performed at [facility], [address] [place], [place] [address]    Report Status 04/04/2024 FINAL  Final  Blood Culture (routine x 2)     Status: None   Collection Time: 03/30/24  1:00 PM   Specimen: BLOOD  Result Value Ref Range Status   Specimen Description BLOOD BLOOD LEFT ARM  Final   Special Requests   Final    BOTTLES DRAWN AEROBIC AND ANAEROBIC Blood Culture results may not be optimal due to an inadequate volume of blood received in culture bottles   Culture   Final    NO GROWTH 5 DAYS Performed at [facility], [address] [place], [place] [address]    Report Status 04/04/2024 FINAL  Final  Resp panel by RT-PCR (RSV, Flu A&B, Covid) Urine, Clean Catch     Status: None   Collection Time: 03/30/24  1:57 PM   Specimen: Urine, Clean Catch; Nasal Swab  Result Value Ref Range Status   SARS Coronavirus 2 by RT PCR NEGATIVE NEGATIVE Final    Comment: (NOTE) SARS-CoV-2 target nucleic acids are NOT DETECTED.  The SARS-CoV-2 RNA is generally detectable in upper respiratory specimens during the acute phase of infection. The lowest concentration of SARS-CoV-2 viral copies this assay can detect is 138 copies/mL. A negative result does not preclude SARS-Cov-2 infection and should not be used as the sole basis for treatment or other patient management decisions. A negative result may occur with  improper specimen collection/handling, submission of specimen other than nasopharyngeal swab, presence of viral mutation(s) within the areas targeted by this assay, and inadequate number of viral copies(<138 copies/mL). A negative result must be combined with clinical observations, patient history, and epidemiological information. The expected result is Negative.  Fact Sheet for Patients:  bloggercourse.com  Fact Sheet for Healthcare Providers:   seriousbroker.it  This test is no t yet approved or cleared by the [place]  FDA and  has been authorized for detection and/or diagnosis of SARS-CoV-2 by FDA under an Emergency Use Authorization (EUA). This EUA will remain  in effect (meaning this test can be used) for the duration of the COVID-19 declaration under Section 564(b)(1) of the Act, 21 U.S.C.section 360bbb-3(b)(1), unless the authorization is terminated  or revoked sooner.       Influenza A by PCR NEGATIVE NEGATIVE Final   Influenza B by PCR NEGATIVE NEGATIVE Final    Comment: (NOTE) The Xpert Xpress SARS-CoV-2/FLU/RSV plus assay is intended as  an aid in the diagnosis of influenza from Nasopharyngeal swab specimens and should not be used as a sole basis for treatment. Nasal washings and aspirates are unacceptable for Xpert Xpress SARS-CoV-2/FLU/RSV testing.  Fact Sheet for Patients: bloggercourse.com  Fact Sheet for Healthcare Providers: seriousbroker.it  This test is not yet approved or cleared by the [place]  FDA and has been authorized for detection and/or diagnosis of SARS-CoV-2 by FDA under an Emergency Use Authorization (EUA). This EUA will remain in effect (meaning this test can be used) for the duration of the COVID-19 declaration under Section 564(b)(1) of the Act, 21 U.S.C. section 360bbb-3(b)(1), unless the authorization is terminated or revoked.     Resp Syncytial Virus by PCR NEGATIVE NEGATIVE Final    Comment: (NOTE) Fact Sheet for Patients: bloggercourse.com  Fact Sheet for Healthcare Providers: seriousbroker.it  This test is not yet approved or cleared by the [place]  FDA and has been authorized for detection and/or diagnosis of SARS-CoV-2 by FDA under an Emergency Use Authorization (EUA). This EUA will remain in effect (meaning this test can be used) for  the duration of the COVID-19 declaration under Section 564(b)(1) of the Act, 21 U.S.C. section 360bbb-3(b)(1), unless the authorization is terminated or revoked.  Performed at [facility], [address] [place], [place] [address]   MRSA Next Gen by PCR, Nasal     Status: None   Collection Time: 03/31/24  4:00 AM   Specimen: Nasal Mucosa; Nasal Swab  Result Value Ref Range Status   MRSA by PCR Next Gen NOT DETECTED NOT DETECTED Final    Comment: (NOTE) The GeneXpert MRSA Assay (FDA approved for NASAL specimens only), is one component of a comprehensive MRSA colonization surveillance program. It is not intended to diagnose MRSA infection nor to guide or monitor treatment for MRSA infections. Test performance is not FDA approved in patients less than 4 years old. Performed at [facility], [address], [place], [place] [address]      Scheduled Meds:  aspirin   81 mg Oral Daily   Chlorhexidine  Gluconate Cloth  6 each Topical Daily   feeding supplement  237 mL Oral BID BM   heparin   5,000 Units Subcutaneous Q8H   lidocaine   1 patch Transdermal Q24H   midodrine   15 mg Oral TID WC   pregabalin   25 mg Oral Daily   rosuvastatin   20 mg Oral Daily   tamsulosin   0.4 mg Oral QPC supper   valACYclovir   1,000 mg Oral BID   Continuous Infusions:  Procedures/Studies: DG CHEST PORT 1 VIEW Result Date: 03/31/2024 CLINICAL DATA:  Pneumothorax EXAM: PORTABLE CHEST 1 VIEW COMPARISON:  Yesterday FINDINGS: Stable cardiomegaly. Moderate left basilar pleural effusion. Minimal right basilar subsegmental atelectasis or scarring. IMPRESSION: Moderate left basilar pleural effusion. Electronically Signed   By: Lynwood Landy Raddle M.D.   On: 03/31/2024 09:29   DG Chest Portable 1 View Result Date: 03/30/2024 EXAM: 1 VIEW(S) XRAY OF THE CHEST 03/30/2024 11:59:53 AM COMPARISON: 03/29/2024 CLINICAL HISTORY: dyspnea dyspnea dyspnea FINDINGS: LUNGS AND PLEURA: Small left pneumothorax. Elevated left  hemidiaphragm. Patchy opacity left lung base with associated pleural effusion. HEART AND MEDIASTINUM: Cardiomegaly, unchanged. Calcified aorta. BONES AND SOFT TISSUES: No acute osseous abnormality. IMPRESSION: 1. Small left pneumothorax. 2. Patchy opacity at the left lung base with associated pleural effusion and elevated left hemidiaphragm. 3. Cardiomegaly, unchanged. Calcified aorta. Electronically signed by: Evalene Coho MD 03/30/2024 12:17 PM EST RP Workstation: [id number]   US  THORACENTESIS LEFT ASP PLEURAL SPACE  W/IMG GUIDE Result Date: 03/29/2024 INDICATION: 89 year old male. History of CHF with recurrent left-sided pleural effusion. Request is for therapeutic thoracentesis. EXAM: ULTRASOUND GUIDED THERAPEUTIC LEFT-SIDED THORACENTESIS MEDICATIONS: Lidocaine  1% 10 mL COMPLICATIONS: None immediate. PROCEDURE: An ultrasound guided thoracentesis was thoroughly discussed with the patient and questions answered. The benefits, risks, alternatives and complications were also discussed. The patient understands and wishes to proceed with the procedure. Written consent was obtained. Ultrasound was performed to localize and mark an adequate pocket of fluid in the left chest. The area was then prepped and draped in the normal sterile fashion. 1% Lidocaine  was used for local anesthesia. Under ultrasound guidance a 6 Fr Safe-T-Centesis catheter was introduced. Thoracentesis was performed. The catheter was removed and a dressing applied. FINDINGS: A total of approximately 1.5 L of serosanguineous fluid was removed. IMPRESSION: Successful ultrasound guided therapeutic left-sided thoracentesis yielding 1.8 of serosanguineous pleural fluid. Performed by Delon Beagle NP Electronically Signed   By: Ester Sides M.D.   On: 03/29/2024 14:43   DG Chest Port 1 View Result Date: 03/29/2024 EXAM: 1 VIEW(S) XRAY OF THE CHEST 03/29/2024 01:45:42 PM COMPARISON: 03/27/2024 CLINICAL HISTORY: S/P thoracentesis. FINDINGS:  LUNGS AND PLEURA: Decreased left pleural effusion status post thoracentesis. No significant pneumothorax identified. Persistent left lung base atelectasis. Trace right pleural effusion. Decreased interstitial edema. No focal pulmonary opacity. HEART AND MEDIASTINUM: Cardiomegaly, stable. BONES AND SOFT TISSUES: No acute osseous abnormality. IMPRESSION: 1. Decreased left pleural effusion status post thoracentesis. No significant pneumothorax identified. 2. Persistent left lung base atelectasis. 3. Trace right pleural effusion. Electronically signed by: Waddell Calk MD 03/29/2024 02:28 PM EST RP Workstation: [id number]   DG Chest 2 View Result Date: 03/27/2024 EXAM: 2 VIEW(S) XRAY OF THE CHEST 03/27/2024 02:07:00 PM COMPARISON: 02/07/2024 CLINICAL HISTORY: pleural effusion left side also history CHF not tachypneic FINDINGS: LUNGS AND PLEURA: Increased large left pleural effusion. Associated overlying left mid to lower lung opacities, likely atelectasis and/or pneumonia. Blunting of the right costophrenic angle. No pneumothorax. HEART AND MEDIASTINUM: Cardiomegaly. Rightward mediastinal shift. BONES AND SOFT TISSUES: Degenerative changes. IMPRESSION: 1. Increased large left pleural effusion with associated overlying left mid to lower lung opacities, likely atelectasis and/or pneumonia. 2. Blunting of the right costophrenic angle, which may reflect a small right pleural effusion. 3. Cardiomegaly and rightward mediastinal shift. Electronically signed by: Waddell Calk MD MD 03/27/2024 03:34 PM EST RP Workstation: [id number]    Alm Schneider, DO  [facility]  If 7PM-7AM, please contact night-coverage [email or web address] Password [email or web address] 04/05/2024, 5:59 PM   LOS: 6 days   "

## 2024-04-05 NOTE — Progress Notes (Signed)
 Physical Therapy Treatment Patient Details Name: [name] MRN: [id number] DOB: [birth year] Today's Date: 04/05/2024   History of Present Illness [name]  is a 89 y.o. male, with a history of asthma, hypertension, gouty arthritis, and hyperlipidemia , systolic CHF with EF 20 to 25%, recurrent right pleural effusion due to CHF.  - Patient presents to ED secondary to shortness of breath and weakness, as well as inability to urinate, send with temperature effusion, hypoxia placed on yesterday, reports this morning still with significant dyspnea, he denies any fever, chills, reports his chronic cough unchanged.    PT Comments  Patient demonstrates good return for sitting up at bedside with [facility] [place] flat, has difficulty completing sit to stands due to BLE weakness and fair/poor carryover for proper hand placement on RW and demonstrated increased endurance/distance for taking steps forward/backwards at bedside. Patient tolerated sitting up in chair after therapy. Patient will benefit from continued skilled physical therapy in hospital and recommended venue below to increase strength, balance, endurance for safe ADLs and gait.      If plan is discharge home, recommend the following: A lot of help with bathing/dressing/bathroom;A lot of help with walking and/or transfers;Help with stairs or ramp for entrance;Assist for transportation;Assistance with cooking/housework   Can travel by private vehicle     Yes  Equipment Recommendations  None recommended by PT    Recommendations for Other Services       Precautions / Restrictions Precautions Precautions: Fall Recall of Precautions/Restrictions: Intact Restrictions Weight Bearing Restrictions Per Provider Order: No     Mobility  Bed Mobility Overal bed mobility: Needs Assistance Bed Mobility: Sit to Supine       Sit to supine: Supervision, Contact guard assist   General bed mobility comments: fair/good return for supine to sit  with HOB flat, no need for using bed rail    Transfers Overall transfer level: Needs assistance Equipment used: Rolling walker (2 wheels) Transfers: Sit to/from Stand, Bed to chair/wheelchair/BSC Sit to Stand: Min assist, Mod assist   Step pivot transfers: Mod assist       General transfer comment: requires repeated verbal cueing for proper hand placement on RW during sit to stands with fair/poor carryover    Ambulation/Gait Ambulation/Gait assistance: Mod assist Gait Distance (Feet): 20 Feet Assistive device: Rolling walker (2 wheels) Gait Pattern/deviations: Decreased step length - right, Decreased step length - left, Decreased stride length, Trunk flexed, Scissoring Gait velocity: slow     General Gait Details: increased endurance, distance for taking steps forward, backwards at bedside before having to sit due to c/o fatigue   Stairs             Wheelchair Mobility     Tilt Bed    Modified Rankin (Stroke Patients Only)       Balance Overall balance assessment: Needs assistance Sitting-balance support: Feet supported, No upper extremity supported Sitting balance-Leahy Scale: Fair Sitting balance - Comments: fair/good seated at EOB   Standing balance support: Reliant on assistive device for balance, During functional activity, Bilateral upper extremity supported Standing balance-Leahy Scale: Poor Standing balance comment: using RW                            Communication Communication Factors Affecting Communication: Hearing impaired;Other (comment)  Cognition Arousal: Alert Behavior During Therapy: WFL for tasks assessed/performed  Following commands: Intact      Cueing Cueing Techniques: Verbal cues, Tactile cues  Exercises General Exercises - Lower Extremity Ankle Circles/Pumps: Seated, Strengthening, Both, 10 reps Long Arc Quad: Seated, AROM, Strengthening, Both, 10 reps Hip  Flexion/Marching: Seated, AROM, Strengthening, Both, 10 reps    General Comments        Pertinent Vitals/Pain Pain Assessment Pain Assessment: No/denies pain    Home Living                          Prior Function            PT Goals (current goals can now be found in the care plan section) Acute Rehab PT Goals Patient Stated Goal: return home after rehab PT Goal Formulation: With patient/family Time For Goal Achievement: 04/14/24 Potential to Achieve Goals: Good Progress towards PT goals: Progressing toward goals    Frequency    Min 3X/week      PT Plan      Co-evaluation              AM-PAC PT 6 Clicks Mobility   Outcome Measure  Help needed turning from your back to your side while in a flat bed without using bedrails?: A Little Help needed moving from lying on your back to sitting on the side of a flat bed without using bedrails?: A Little Help needed moving to and from a bed to a chair (including a wheelchair)?: A Lot Help needed standing up from a chair using your arms (e.g., wheelchair or bedside chair)?: A Lot Help needed to walk in hospital room?: A Lot Help needed climbing 3-5 steps with a railing? : A Lot 6 Click Score: 14    End of Session   Activity Tolerance: Patient tolerated treatment well;Patient limited by fatigue Patient left: in chair;with call bell/phone within reach Nurse Communication: Mobility status PT Visit Diagnosis: Unsteadiness on feet (R26.81);Other abnormalities of gait and mobility (R26.89);Muscle weakness (generalized) (M62.81)     Time: 8941-8879 PT Time Calculation (min) (ACUTE ONLY): 22 min  Charges:    $Gait Training: 8-22 mins $Therapeutic Exercise: 8-22 mins PT General Charges $$ ACUTE PT VISIT: 1 Visit                     3:55 PM, 04/05/24 Lynwood Music, MPT Physical Therapist with [facility] [phone] office [phone] mobile phone

## 2024-04-06 DIAGNOSIS — R571 Hypovolemic shock: Secondary | ICD-10-CM

## 2024-04-06 DIAGNOSIS — I5020 Unspecified systolic (congestive) heart failure: Secondary | ICD-10-CM | POA: Diagnosis not present

## 2024-04-06 DIAGNOSIS — J95811 Postprocedural pneumothorax: Secondary | ICD-10-CM | POA: Diagnosis not present

## 2024-04-06 LAB — GLUCOSE, CAPILLARY: Glucose-Capillary: 85 mg/dL (ref 70–99)

## 2024-04-06 NOTE — Plan of Care (Signed)
   Problem: Fluid Volume: Goal: Hemodynamic stability will improve Outcome: Progressing   Problem: Clinical Measurements: Goal: Diagnostic test results will improve Outcome: Progressing Goal: Signs and symptoms of infection will decrease Outcome: Progressing

## 2024-04-06 NOTE — TOC Progression Note (Signed)
 Transition of Care [facility]) - Progression Note    Patient Details  Name: [name] MRN: [id number] Date of Birth: [birth year]  Transition of Care [facility]) CM/SW Contact  Sharlyne Stabs, RN Phone Number: 04/06/2024, 1:20 PM  Clinical Narrative:  Ava called back, family discussed [address] and [place]. They have made a plan for someone to stay with him until he gets stronger and they are requesting HHPT.  Referral sent in the hub. Hedda accepted. MD placed orders. Planning to discharge patient home tomorrow.    Expected Discharge Plan: Home w Home Health Services Barriers to Discharge: Continued Medical Work up     Expected Discharge Plan and Services     Post Acute Care Choice: Skilled Nursing Facility Living arrangements for the past 2 months: Single Family Home                    HH Agency: [facility] Date [facility] Agency Contacted: 04/06/24 Time HH Agency Contacted: 1320 Representative spoke with at [facility] Agency: in the [employer]   Social Drivers of Health (SDOH) Interventions SDOH Screenings   Food Insecurity: Patient Unable To Answer (03/31/2024)  Housing: Patient Unable To Answer (03/31/2024)  Transportation Needs: Patient Unable To Answer (03/31/2024)  Utilities: Patient Unable To Answer (03/31/2024)  Depression (PHQ2-9): Low Risk (02/02/2024)  Financial Resource Strain: Low Risk (03/18/2022)  Social Connections: Patient Unable To Answer (03/31/2024)  Tobacco Use: Medium Risk (04/03/2024)    Readmission Risk Interventions    03/13/2022    2:05 PM  Readmission Risk Prevention Plan  Post Dischage Appt Not Complete  Medication Screening Complete  Transportation Screening Complete

## 2024-04-06 NOTE — Plan of Care (Signed)
" °  Problem: Respiratory: Goal: Ability to maintain adequate ventilation will improve Outcome: Progressing   Problem: Clinical Measurements: Goal: Respiratory complications will improve Outcome: Progressing   Problem: Activity: Goal: Risk for activity intolerance will decrease Outcome: Progressing   Problem: Nutrition: Goal: Adequate nutrition will be maintained Outcome: Progressing   Problem: Pain Managment: Goal: General experience of comfort will improve and/or be controlled Outcome: Progressing   Problem: Skin Integrity: Goal: Risk for impaired skin integrity will decrease Outcome: Progressing   "

## 2024-04-06 NOTE — Progress Notes (Signed)
 "          PROGRESS NOTE  [name] FMW:[id number] DOB: 18-Apr-1925 DOA: 03/30/2024 PCP: Alphonsa Glendia LABOR, MD  Brief History:  89 y.o. male, with a history of asthma, hypertension, gouty arthritis, and hyperlipidemia , systolic CHF with EF 20 to 25%, recurrent right pleural effusion due to CHF. - Patient presents to ED secondary to shortness of breath and weakness, as well as inability to urinate, pleural effusion, hypoxia placed on oxygen  on the day prior to admission.  Hereports this morning still with significant dyspnea, he denies any fever, chills, reports his chronic cough unchanged. - in ED  his workup was significant for small pleural effusion, pulmonary elevated at 94, lactic acid of 3.6, creatinine at baseline 1.44, to have retention, Foley catheter inserted with 600 cc of immediate urine output, was hypotensive 76/59, he responded to fluid bolus, chest x-ray significant for mild left pneumothorax left lung base opacity, [facility] consulted to admit. Reviewed chest x-ray showed resolution of his pneumothorax.  The patient was noted to be hypotension.  He was fluid resuscitated with improvement of his blood pressure.  The patient was started on Valtrex  for shingles on around his left flank area.  The patient finished a full course of Valtrex  on the evening of 04/05/2024. Overall, the patient's shingles lesions have involuted without any open blisters or draining wounds.  His pain has completely resolved.  Ultimately, the patient was removed off of airborne isolation on 04/06/2024. The patient remained hemodynamically stable albeit with soft blood pressures on midodrine .  The patient remained stable on room air without any respiratory distress.  Initially, family wanted to pursue SNF.  However, did not decide to take the patient home with home health services.   Assessment/Plan:  Hypovolemic shock - Pt presented Hypotensive, with elevated lactic acid, tachycardic - He had an  excellent response to fluid - procalcitonin 0.16 very low likelihood of bacterial infection, DC antibiotics  -The patient remained afebrile -His blood pressures remained soft>> midodrine  was started - 01/18/2024 echo EF 20 to 25%, I have discussed this with daughter, please see discussion under goals of care, currently we are limited, and advanced age, gentle hydration will monitor closely, continue on midodrine  for hypotension, not a candidate for advanced therapies. - Hold antihypertensive medications until BP recovers - discussion with daughter POA that he qualifies for hospice and she verbalized understanding and agreeable to hospice  - palliative team consulted - initial plan was to d/c to SNF with palliative services - family now prefers to take patient home   Hypotension - he has required scheduled midodrine  15 mg TID to maintain BPs - after discussion with family plan is not to escalate care further with plans to transition to hospice care  - BPs remain soft but stabilized on midodrine  and holding GDMT   Shingles--LESIONS have involuted without open blisters or draining wounds - Left chest area in a dermatomal pattern - Last dose of Valtrex  04/05/2024 evening -Discontinue airborne isolation 04/06/2024 AM - starting pregabalin  25 mg daily for pain associated with shingles - added lidocaine  patch 5% for pain symptoms   Lactic acidosis--RESOLVED  -- resolved with IV fluid resuscitation   Urinary retention  - Foley catheter inserted, started on Flomax   - failed voiding trial, re-insert foley 1/19 - d/c home with foley - follow up urology for voiding trial   CKD stage IIIb - Creatinine at baseline -Baseline creatinine 1.1-1.4   SVT - Patient had SVT while in  the ED, converted to NSR without intervention   chronic HFrEF - 01/18/2024 echo EF 20-25%, normal RVF, - Hold GDMT medications including Aldactone , Farxiga  and losartan  given low blood pressure   Recurrent Left pleural  effusion Pneumothorax - S/p thoracentesis 03/29/24 where 1.8 L fluid was drained, this is most likely in the setting of CHF, no labs were sent on that effusion. - Chest x-ray with small pneumothorax, ED discussed with Dr. Annella, who recommended to start on oxygen  and repeat x-ray in a.m. - repeat CXR 1/16 no findings of pneumothorax reported - review of medical record shows pt has required intermittent thoracocenteses about once a month - now stable on RA   Failure to thrive - Continue with mirtazapine  - supplements - liberalized diet    Goals of care  -Discussed with daughters including HCPOA, who is an [employer], continue with current measures with no escalation of care, regarding low EF she understands he is not a candidate for advanced therapies, we will hold his antihypertensive medications, started on midodrine , with no escalation of care, no central lines, if he decompensates/deteriorates despite that then plan to proceed with full comfort measures, palliative medicine consulted -pt stabilized--initially planned for SNF, but family now prefers to take pt home         Family Communication:  no Family at bedside   Consultants:  palliative   Code Status:  DNR   DVT Prophylaxis:  [place] Heparin       Procedures: As Listed in Progress Note Above   Antibiotics: None         Subjective: Patient denies fevers, chills, headache, chest pain, dyspnea, nausea, vomiting, diarrhea, abdominal pain, dysuria, hematuria, hematochezia, and melena.   Objective: Vitals:   04/05/24 1925 04/06/24 0319 04/06/24 0835 04/06/24 1411  BP: 101/68 134/80 111/79 111/68  Pulse: 79 99 90 81  Resp: 20 20  18   Temp: 97.8 F (36.6 C) 98.2 F (36.8 C)  97.6 F (36.4 C)  TempSrc: Oral Oral  Oral  SpO2: 91% 93% 97% 95%  Weight:      Height:        Intake/Output Summary (Last 24 hours) at 04/06/2024 1709 Last data filed at 04/06/2024 1020 Gross per 24 hour  Intake 480 ml  Output 225 ml  Net  255 ml   Weight change:  Exam:  General:  Pt is alert, follows commands appropriately, not in acute distress HEENT: No icterus, No thrush, No neck mass, [place]/AT Cardiovascular: RRR, S1/S2, no rubs, no gallops Respiratory: Bibasilar crackles.  No wheezing.  Diminished breath sounds on the left. Abdomen: Soft/+BS, non tender, non distended, no guarding Extremities: No edema, No lymphangitis, No petechiae, No rashes, no synovitis   Data Reviewed: I have personally reviewed following labs and imaging studies Basic Metabolic Panel: Recent Labs  Lab 03/30/24 1802 03/31/24 0524 04/01/24 0551  NA 139 140 139  K 3.4* 3.4* 3.8  CL 101 101 104  CO2 21* 24 25  GLUCOSE 97 82 85  BUN 18 19 17   CREATININE 1.32* 1.15 1.01  CALCIUM  8.9 8.9 8.4*  MG  --   --  2.2   Liver Function Tests: Recent Labs  Lab 03/30/24 1802  AST 19  ALT <5  ALKPHOS 63  BILITOT 1.7*  PROT 6.9  ALBUMIN 3.1*   No results for input(s): LIPASE, AMYLASE in the last 168 hours. No results for input(s): AMMONIA in the last 168 hours. Coagulation Profile: Recent Labs  Lab 03/30/24 1943  INR 1.3*  CBC: Recent Labs  Lab 03/30/24 1802 03/31/24 0524 04/01/24 0551  WBC 5.0 10.0 7.0  NEUTROABS 4.0  --   --   HGB 10.6* 10.8* 9.1*  HCT 37.5* 35.9* 30.6*  MCV 106.8* 102.0* 102.7*  PLT 141* 156 141*   Cardiac Enzymes: No results for input(s): CKTOTAL, CKMB, CKMBINDEX, TROPONINI in the last 168 hours. BNP: Invalid input(s): POCBNP CBG: Recent Labs  Lab 03/31/24 2217 04/06/24 0738  GLUCAP 111* 85   HbA1C: No results for input(s): HGBA1C in the last 72 hours. Urine analysis:    Component Value Date/Time   COLORURINE YELLOW 03/30/2024 1357   APPEARANCEUR CLEAR 03/30/2024 1357   LABSPEC 1.010 03/30/2024 1357   PHURINE 6.0 03/30/2024 1357   GLUCOSEU >=500 (A) 03/30/2024 1357   HGBUR SMALL (A) 03/30/2024 1357   BILIRUBINUR NEGATIVE 03/30/2024 1357   BILIRUBINUR moderate (A)  10/14/2022 1119   KETONESUR NEGATIVE 03/30/2024 1357   PROTEINUR 30 (A) 03/30/2024 1357   UROBILINOGEN 0.2 10/14/2022 1119   NITRITE NEGATIVE 03/30/2024 1357   LEUKOCYTESUR NEGATIVE 03/30/2024 1357   Sepsis Labs: @LABRCNTIP (procalcitonin:4,lacticidven:4) ) Recent Results (from the past 240 hours)  Blood Culture (routine x 2)     Status: None   Collection Time: 03/30/24 12:37 PM   Specimen: BLOOD  Result Value Ref Range Status   Specimen Description BLOOD BLOOD RIGHT ARM  Final   Special Requests   Final    BOTTLES DRAWN AEROBIC ONLY Blood Culture adequate volume   Culture   Final    NO GROWTH 5 DAYS Performed at [facility], [address], [place], [place] [address]    Report Status 04/04/2024 FINAL  Final  Blood Culture (routine x 2)     Status: None   Collection Time: 03/30/24  1:00 PM   Specimen: BLOOD  Result Value Ref Range Status   Specimen Description BLOOD BLOOD LEFT ARM  Final   Special Requests   Final    BOTTLES DRAWN AEROBIC AND ANAEROBIC Blood Culture results may not be optimal due to an inadequate volume of blood received in culture bottles   Culture   Final    NO GROWTH 5 DAYS Performed at [facility], [address], [place], [place] [address]    Report Status 04/04/2024 FINAL  Final  Resp panel by RT-PCR (RSV, Flu A&B, Covid) Urine, Clean Catch     Status: None   Collection Time: 03/30/24  1:57 PM   Specimen: Urine, Clean Catch; Nasal Swab  Result Value Ref Range Status   SARS Coronavirus 2 by RT PCR NEGATIVE NEGATIVE Final    Comment: (NOTE) SARS-CoV-2 target nucleic acids are NOT DETECTED.  The SARS-CoV-2 RNA is generally detectable in upper respiratory specimens during the acute phase of infection. The lowest concentration of SARS-CoV-2 viral copies this assay can detect is 138 copies/mL. A negative result does not preclude SARS-Cov-2 infection and should not be used as the sole basis for treatment or other patient management decisions. A negative  result may occur with  improper specimen collection/handling, submission of specimen other than nasopharyngeal swab, presence of viral mutation(s) within the areas targeted by this assay, and inadequate number of viral copies(<138 copies/mL). A negative result must be combined with clinical observations, patient history, and epidemiological information. The expected result is Negative.  Fact Sheet for Patients:  bloggercourse.com  Fact Sheet for Healthcare Providers:  seriousbroker.it  This test is no t yet approved or cleared by the [place]  FDA and  has been authorized  for detection and/or diagnosis of SARS-CoV-2 by FDA under an Emergency Use Authorization (EUA). This EUA will remain  in effect (meaning this test can be used) for the duration of the COVID-19 declaration under Section 564(b)(1) of the Act, 21 U.S.C.section 360bbb-3(b)(1), unless the authorization is terminated  or revoked sooner.       Influenza A by PCR NEGATIVE NEGATIVE Final   Influenza B by PCR NEGATIVE NEGATIVE Final    Comment: (NOTE) The Xpert Xpress SARS-CoV-2/FLU/RSV plus assay is intended as an aid in the diagnosis of influenza from Nasopharyngeal swab specimens and should not be used as a sole basis for treatment. Nasal washings and aspirates are unacceptable for Xpert Xpress SARS-CoV-2/FLU/RSV testing.  Fact Sheet for Patients: bloggercourse.com  Fact Sheet for Healthcare Providers: seriousbroker.it  This test is not yet approved or cleared by the [place]  FDA and has been authorized for detection and/or diagnosis of SARS-CoV-2 by FDA under an Emergency Use Authorization (EUA). This EUA will remain in effect (meaning this test can be used) for the duration of the COVID-19 declaration under Section 564(b)(1) of the Act, 21 U.S.C. section 360bbb-3(b)(1), unless the authorization is  terminated or revoked.     Resp Syncytial Virus by PCR NEGATIVE NEGATIVE Final    Comment: (NOTE) Fact Sheet for Patients: bloggercourse.com  Fact Sheet for Healthcare Providers: seriousbroker.it  This test is not yet approved or cleared by the [place]  FDA and has been authorized for detection and/or diagnosis of SARS-CoV-2 by FDA under an Emergency Use Authorization (EUA). This EUA will remain in effect (meaning this test can be used) for the duration of the COVID-19 declaration under Section 564(b)(1) of the Act, 21 U.S.C. section 360bbb-3(b)(1), unless the authorization is terminated or revoked.  Performed at [facility], [address], [place], [place] [address]   MRSA Next Gen by PCR, Nasal     Status: None   Collection Time: 03/31/24  4:00 AM   Specimen: Nasal Mucosa; Nasal Swab  Result Value Ref Range Status   MRSA by PCR Next Gen NOT DETECTED NOT DETECTED Final    Comment: (NOTE) The GeneXpert MRSA Assay (FDA approved for NASAL specimens only), is one component of a comprehensive MRSA colonization surveillance program. It is not intended to diagnose MRSA infection nor to guide or monitor treatment for MRSA infections. Test performance is not FDA approved in patients less than 45 years old. Performed at [facility], [address], [place], [place] [address]      Scheduled Meds:  aspirin   81 mg Oral Daily   Chlorhexidine  Gluconate Cloth  6 each Topical Daily   feeding supplement  237 mL Oral BID BM   heparin   5,000 Units Subcutaneous Q8H   lidocaine   1 patch Transdermal Q24H   midodrine   15 mg Oral TID WC   pregabalin   25 mg Oral Daily   rosuvastatin   20 mg Oral Daily   tamsulosin   0.4 mg Oral QPC supper   Continuous Infusions:  Procedures/Studies: DG CHEST PORT 1 VIEW Result Date: 03/31/2024 CLINICAL DATA:  Pneumothorax EXAM: PORTABLE CHEST 1 VIEW COMPARISON:  Yesterday FINDINGS: Stable cardiomegaly.  Moderate left basilar pleural effusion. Minimal right basilar subsegmental atelectasis or scarring. IMPRESSION: Moderate left basilar pleural effusion. Electronically Signed   By: Lynwood Landy Raddle M.D.   On: 03/31/2024 09:29   DG Chest Portable 1 View Result Date: 03/30/2024 EXAM: 1 VIEW(S) XRAY OF THE CHEST 03/30/2024 11:59:53 AM COMPARISON: 03/29/2024 CLINICAL HISTORY: dyspnea dyspnea dyspnea FINDINGS:  LUNGS AND PLEURA: Small left pneumothorax. Elevated left hemidiaphragm. Patchy opacity left lung base with associated pleural effusion. HEART AND MEDIASTINUM: Cardiomegaly, unchanged. Calcified aorta. BONES AND SOFT TISSUES: No acute osseous abnormality. IMPRESSION: 1. Small left pneumothorax. 2. Patchy opacity at the left lung base with associated pleural effusion and elevated left hemidiaphragm. 3. Cardiomegaly, unchanged. Calcified aorta. Electronically signed by: Evalene Coho MD 03/30/2024 12:17 PM EST RP Workstation: [id number]   US  THORACENTESIS LEFT ASP PLEURAL SPACE W/IMG GUIDE Result Date: 03/29/2024 INDICATION: 89 year old male. History of CHF with recurrent left-sided pleural effusion. Request is for therapeutic thoracentesis. EXAM: ULTRASOUND GUIDED THERAPEUTIC LEFT-SIDED THORACENTESIS MEDICATIONS: Lidocaine  1% 10 mL COMPLICATIONS: None immediate. PROCEDURE: An ultrasound guided thoracentesis was thoroughly discussed with the patient and questions answered. The benefits, risks, alternatives and complications were also discussed. The patient understands and wishes to proceed with the procedure. Written consent was obtained. Ultrasound was performed to localize and mark an adequate pocket of fluid in the left chest. The area was then prepped and draped in the normal sterile fashion. 1% Lidocaine  was used for local anesthesia. Under ultrasound guidance a 6 Fr Safe-T-Centesis catheter was introduced. Thoracentesis was performed. The catheter was removed and a dressing applied. FINDINGS: A total of  approximately 1.5 L of serosanguineous fluid was removed. IMPRESSION: Successful ultrasound guided therapeutic left-sided thoracentesis yielding 1.8 of serosanguineous pleural fluid. Performed by Delon Beagle NP Electronically Signed   By: Ester Sides M.D.   On: 03/29/2024 14:43   DG Chest Port 1 View Result Date: 03/29/2024 EXAM: 1 VIEW(S) XRAY OF THE CHEST 03/29/2024 01:45:42 PM COMPARISON: 03/27/2024 CLINICAL HISTORY: S/P thoracentesis. FINDINGS: LUNGS AND PLEURA: Decreased left pleural effusion status post thoracentesis. No significant pneumothorax identified. Persistent left lung base atelectasis. Trace right pleural effusion. Decreased interstitial edema. No focal pulmonary opacity. HEART AND MEDIASTINUM: Cardiomegaly, stable. BONES AND SOFT TISSUES: No acute osseous abnormality. IMPRESSION: 1. Decreased left pleural effusion status post thoracentesis. No significant pneumothorax identified. 2. Persistent left lung base atelectasis. 3. Trace right pleural effusion. Electronically signed by: Waddell Calk MD 03/29/2024 02:28 PM EST RP Workstation: [id number]   DG Chest 2 View Result Date: 03/27/2024 EXAM: 2 VIEW(S) XRAY OF THE CHEST 03/27/2024 02:07:00 PM COMPARISON: 02/07/2024 CLINICAL HISTORY: pleural effusion left side also history CHF not tachypneic FINDINGS: LUNGS AND PLEURA: Increased large left pleural effusion. Associated overlying left mid to lower lung opacities, likely atelectasis and/or pneumonia. Blunting of the right costophrenic angle. No pneumothorax. HEART AND MEDIASTINUM: Cardiomegaly. Rightward mediastinal shift. BONES AND SOFT TISSUES: Degenerative changes. IMPRESSION: 1. Increased large left pleural effusion with associated overlying left mid to lower lung opacities, likely atelectasis and/or pneumonia. 2. Blunting of the right costophrenic angle, which may reflect a small right pleural effusion. 3. Cardiomegaly and rightward mediastinal shift. Electronically signed by: Waddell Calk MD MD 03/27/2024 03:34 PM EST RP Workstation: [id number]    Alm Schneider, DO  [facility]  If 7PM-7AM, please contact night-coverage [email or web address] Password [email or web address] 04/06/2024, 5:09 PM   LOS: 7 days   "

## 2024-04-07 DIAGNOSIS — R579 Shock, unspecified: Secondary | ICD-10-CM | POA: Diagnosis not present

## 2024-04-07 DIAGNOSIS — R338 Other retention of urine: Secondary | ICD-10-CM | POA: Diagnosis not present

## 2024-04-07 DIAGNOSIS — I5020 Unspecified systolic (congestive) heart failure: Secondary | ICD-10-CM | POA: Diagnosis not present

## 2024-04-07 MED ORDER — OXYCODONE HCL 5 MG PO TABS: 5.0000 mg | ORAL_TABLET | Freq: Four times a day (QID) | ORAL | Status: DC | PRN

## 2024-04-07 MED ORDER — ACETAMINOPHEN 500 MG PO TABS: 1000.0000 mg | ORAL_TABLET | Freq: Three times a day (TID) | ORAL | Status: DC

## 2024-04-07 MED ORDER — MIDODRINE HCL 5 MG PO TABS: 15.0000 mg | ORAL_TABLET | Freq: Three times a day (TID) | ORAL | 1 refills | Status: AC

## 2024-04-07 MED ORDER — PREGABALIN 25 MG PO CAPS: 25.0000 mg | ORAL_CAPSULE | Freq: Two times a day (BID) | ORAL | 1 refills | Status: AC

## 2024-04-07 MED ORDER — ACETAMINOPHEN 500 MG PO TABS: 1000.0000 mg | ORAL_TABLET | Freq: Three times a day (TID) | ORAL | Status: AC

## 2024-04-07 MED ORDER — ENSURE PLUS HIGH PROTEIN PO LIQD: 237.0000 mL | Freq: Two times a day (BID) | ORAL | Status: AC

## 2024-04-07 MED ORDER — OXYCODONE HCL 5 MG PO TABS: 5.0000 mg | ORAL_TABLET | Freq: Four times a day (QID) | ORAL | 0 refills | Status: AC | PRN

## 2024-04-07 MED ORDER — LIDOCAINE 5 % EX PTCH: 1.0000 | MEDICATED_PATCH | CUTANEOUS | 0 refills | Status: AC

## 2024-04-07 NOTE — Progress Notes (Signed)
 Physical Therapy Treatment Patient Details Name: [name] MRN: [id number] DOB: April 20, 1925 Today's Date: 04/07/2024   History of Present Illness [name]  is a 89 y.o. male, with a history of asthma, hypertension, gouty arthritis, and hyperlipidemia , systolic CHF with EF 20 to 25%, recurrent right pleural effusion due to CHF.  - Patient presents to ED secondary to shortness of breath and weakness, as well as inability to urinate, send with temperature effusion, hypoxia placed on yesterday, reports this morning still with significant dyspnea, he denies any fever, chills, reports his chronic cough unchanged.    PT Comments  Patient agreeable for therapy. Patient demonstrates slow labored movement for sitting up at bedside, once seated c/o fatigue and limited to a few steps forward/backwards at bedside before having to sit due to weakness. Patent able to complete a couple more steps but rested to sit due to fatigue and weakness. Patient tolerated sitting up in chair after therapy - nursing staff aware. Patient will benefit from continued skilled physical therapy in hospital and recommended venue below to increase strength, balance, endurance for safe ADLs and gait.       If plan is discharge home, recommend the following: A lot of help with bathing/dressing/bathroom;A lot of help with walking and/or transfers;Help with stairs or ramp for entrance;Assist for transportation;Assistance with cooking/housework   Can travel by private vehicle     Yes  Equipment Recommendations  None recommended by PT    Recommendations for Other Services       Precautions / Restrictions Precautions Precautions: Fall Recall of Precautions/Restrictions: Intact Restrictions Weight Bearing Restrictions Per Provider Order: No     Mobility  Bed Mobility Overal bed mobility: Needs Assistance Bed Mobility: Sit to Supine       Sit to supine: Contact guard assist, Min assist   General bed  mobility comments: increased time, labored movement    Transfers Overall transfer level: Needs assistance Equipment used: Rolling walker (2 wheels) Transfers: Sit to/from Stand, Bed to chair/wheelchair/BSC Sit to Stand: Min assist, Mod assist   Step pivot transfers: Min assist, Mod assist       General transfer comment: frequent buckling of knees due to weakness    Ambulation/Gait Ambulation/Gait assistance: Mod assist Gait Distance (Feet): 10 Feet Assistive device: Rolling walker (2 wheels) Gait Pattern/deviations: Decreased step length - right, Decreased step length - left, Decreased stride length, Trunk flexed Gait velocity: slow     General Gait Details: limited to a few steps forward/backwards at bedside before having to sit due toc/o fatigue and BLE weakness   Stairs             Wheelchair Mobility     Tilt Bed    Modified Rankin (Stroke Patients Only)       Balance Overall balance assessment: Needs assistance Sitting-balance support: Feet supported, No upper extremity supported Sitting balance-Leahy Scale: Fair Sitting balance - Comments: fair/good seated at EOB   Standing balance support: Reliant on assistive device for balance, During functional activity, Bilateral upper extremity supported Standing balance-Leahy Scale: Poor Standing balance comment: using RW                            Communication Communication Factors Affecting Communication: Hearing impaired;Other (comment)  Cognition Arousal: Alert Behavior During Therapy: WFL for tasks assessed/performed  Following commands: Intact      Cueing Cueing Techniques: Verbal cues, Tactile cues  Exercises General Exercises - Lower Extremity Long Arc Quad: Seated, AROM, Strengthening, Both, 10 reps Hip Flexion/Marching: Seated, AROM, Strengthening, Both, 10 reps    General Comments        Pertinent Vitals/Pain Pain Assessment Pain  Assessment: No/denies pain    Home Living Family/patient expects to be discharged to:: Private residence Living Arrangements: Children                      Prior Function            PT Goals (current goals can now be found in the care plan section) Acute Rehab PT Goals Patient Stated Goal: return home after rehab PT Goal Formulation: With patient/family Time For Goal Achievement: 04/14/24 Potential to Achieve Goals: Good Progress towards PT goals: Progressing toward goals    Frequency    Min 3X/week      PT Plan      Co-evaluation              AM-PAC PT 6 Clicks Mobility   Outcome Measure  Help needed turning from your back to your side while in a flat bed without using bedrails?: A Little Help needed moving from lying on your back to sitting on the side of a flat bed without using bedrails?: A Little Help needed moving to and from a bed to a chair (including a wheelchair)?: A Lot Help needed standing up from a chair using your arms (e.g., wheelchair or bedside chair)?: A Lot Help needed to walk in hospital room?: A Lot Help needed climbing 3-5 steps with a railing? : A Lot 6 Click Score: 14    End of Session   Activity Tolerance: Patient tolerated treatment well;Patient limited by fatigue Patient left: in chair;with call bell/phone within reach;with chair alarm set Nurse Communication: Mobility status PT Visit Diagnosis: Unsteadiness on feet (R26.81);Other abnormalities of gait and mobility (R26.89);Muscle weakness (generalized) (M62.81)     Time: 8861-8799 PT Time Calculation (min) (ACUTE ONLY): 22 min  Charges:    $Therapeutic Exercise: 8-22 mins $Therapeutic Activity: 8-22 mins PT General Charges $$ ACUTE PT VISIT: 1 Visit                     2:29 PM, 04/07/24 Lynwood Music, MPT Physical Therapist with [facility] [phone] office [phone] mobile phone

## 2024-04-07 NOTE — TOC Transition Note (Signed)
 Transition of Care [facility]) - Discharge Note   Patient Details  Name: [name] MRN: [id number] Date of Birth: 1925/05/24  Transition of Care [facility]) CM/SW Contact:  Ronnald MARLA Sil, RN Phone Number: 04/07/2024, 1:17 PM   Clinical Narrative:    Patient discussed during Progression rounds this morning with Dr Tat emphasizing patient's medical readiness to transition out of hospital and to next level of Care.  CM conducted chart review to confirm discharge plan to return home with [facility] & RN.  CM contacted patient's Sister - Ava to notify of DC, she indicated she planned to arrive around 2:30 to transport patient home, confirmed availability of wheelchair and RW left from her Mother, but stated the rolling walker has a few missing parts and requested a new RW for patient to use, indicated no preference on agency source.  CM confirmed HH order for PT & RN entered, forwarded referral and HH order to [facility] and notified liaison - Darleene of patient's DC.    CM also forwarded referral & DME order to [employer] for [employer], and notifed agency liaison - Zach via text of DME order and patient's D/C   CM No additional transition needs identified, however CM team will continue to follow along and assist as appropriate.   Final next level of care: Home w Home Health Services Barriers to Discharge: No Barriers Identified  Patient Goals and CMS Choice Patient states their goals for this hospitalization and ongoing recovery are:: Return Home with [facility] & RW CMS Medicare.gov Compare Post Acute Care list provided to:: Patient Represenative (must comment) Choice offered to / list presented to : Sibling Catherine ownership interest in [facility].provided to:: Sibling   Discharge Placement Name of family member notified: Daughter - Ava Patient and family notified of of transfer: 04/07/24  Discharge Plan and Services Additional resources added to the After Visit Summary for     Post Acute Care Choice: Home Health, Durable Medical Equipment          DME Arranged: Vannie DME Agency: [facility] Date DME Agency Contacted: 04/07/24 Time DME Agency Contacted: 1311 Representative spoke with at DME Agency: Darlyn via text HH Arranged: PT, RN HH Agency: [facility] Date [facility] Agency Contacted: 04/07/24 Time HH Agency Contacted: 1311 Representative spoke with at [facility] Agency: Darleene via Text  Social Drivers of Health (SDOH) Interventions SDOH Screenings   Food Insecurity: Patient Unable To Answer (03/31/2024)  Housing: Patient Unable To Answer (03/31/2024)  Transportation Needs: Patient Unable To Answer (03/31/2024)  Utilities: Patient Unable To Answer (03/31/2024)  Depression (PHQ2-9): Low Risk (02/02/2024)  Financial Resource Strain: Low Risk (03/18/2022)  Social Connections: Patient Unable To Answer (03/31/2024)  Tobacco Use: Medium Risk (04/03/2024)    Readmission Risk Interventions    03/13/2022    2:05 PM  Readmission Risk Prevention Plan  Post Dischage Appt Not Complete  Medication Screening Complete  Transportation Screening Complete

## 2024-04-07 NOTE — Plan of Care (Signed)
" °  Problem: Safety: Goal: Ability to remain free from injury will improve 04/07/2024 1446 by Elner Ames MATSU, RN Outcome: Adequate for Discharge 04/07/2024 1445 by Elner Ames MATSU, RN Outcome: Progressing 04/07/2024 1416 by Elner Ames MATSU, RN Outcome: Progressing   Problem: Skin Integrity: Goal: Risk for impaired skin integrity will decrease 04/07/2024 1446 by Elner Ames MATSU, RN Outcome: Adequate for Discharge 04/07/2024 1445 by Elner Ames MATSU, RN Outcome: Progressing 04/07/2024 1416 by Elner Ames MATSU, RN Outcome: Progressing   Problem: Acute Rehab PT Goals(only PT should resolve) Goal: Pt Will Go Supine/Side To Sit Outcome: Adequate for Discharge Goal: Patient Will Transfer Sit To/From Stand Outcome: Adequate for Discharge Goal: Pt Will Transfer Bed To Chair/Chair To Bed Outcome: Adequate for Discharge Goal: Pt Will Ambulate Outcome: Adequate for Discharge   Problem: Nutrition: Goal: Adequate nutrition will be maintained 04/07/2024 1446 by Elner Ames MATSU, RN Outcome: Adequate for Discharge 04/07/2024 1445 by Elner Ames MATSU, RN Outcome: Progressing 04/07/2024 1416 by Elner Ames MATSU, RN Outcome: Progressing   Problem: Activity: Goal: Risk for activity intolerance will decrease 04/07/2024 1446 by Elner Ames MATSU, RN Outcome: Adequate for Discharge 04/07/2024 1445 by Elner Ames MATSU, RN Outcome: Progressing 04/07/2024 1416 by Elner Ames MATSU, RN Outcome: Progressing   Problem: Clinical Measurements: Goal: Ability to maintain clinical measurements within normal limits will improve 04/07/2024 1446 by Elner Ames MATSU, RN Outcome: Adequate for Discharge 04/07/2024 1445 by Elner Ames MATSU, RN Outcome: Progressing 04/07/2024 1416 by Elner Ames MATSU, RN Outcome: Progressing Goal: Will remain free from infection 04/07/2024 1446 by Elner Ames MATSU, RN Outcome: Adequate for Discharge 04/07/2024 1445 by Elner Ames MATSU, RN Outcome:  Progressing 04/07/2024 1416 by Elner Ames MATSU, RN Outcome: Progressing Goal: Diagnostic test results will improve 04/07/2024 1446 by Elner Ames MATSU, RN Outcome: Adequate for Discharge 04/07/2024 1445 by Elner Ames MATSU, RN Outcome: Progressing 04/07/2024 1416 by Elner Ames MATSU, RN Outcome: Progressing Goal: Respiratory complications will improve 04/07/2024 1446 by Elner Ames MATSU, RN Outcome: Adequate for Discharge 04/07/2024 1445 by Elner Ames MATSU, RN Outcome: Progressing 04/07/2024 1416 by Elner Ames MATSU, RN Outcome: Progressing Goal: Cardiovascular complication will be avoided 04/07/2024 1446 by Elner Ames MATSU, RN Outcome: Adequate for Discharge 04/07/2024 1445 by Elner Ames MATSU, RN Outcome: Progressing 04/07/2024 1416 by Elner Ames MATSU, RN Outcome: Progressing   Problem: Clinical Measurements: Goal: Diagnostic test results will improve 04/07/2024 1446 by Elner Ames MATSU, RN Outcome: Adequate for Discharge 04/07/2024 1445 by Elner Ames MATSU, RN Outcome: Progressing 04/07/2024 1416 by Elner Ames MATSU, RN Outcome: Progressing Goal: Signs and symptoms of infection will decrease 04/07/2024 1446 by Elner Ames MATSU, RN Outcome: Adequate for Discharge 04/07/2024 1445 by Elner Ames MATSU, RN Outcome: Progressing 04/07/2024 1416 by Elner Ames MATSU, RN Outcome: Progressing   Problem: Fluid Volume: Goal: Hemodynamic stability will improve 04/07/2024 1446 by Elner Ames MATSU, RN Outcome: Adequate for Discharge 04/07/2024 1445 by Elner Ames MATSU, RN Outcome: Progressing 04/07/2024 1416 by Elner Ames MATSU, RN Outcome: Progressing   "

## 2024-04-07 NOTE — Discharge Summary (Addendum)
 " Physician Discharge Summary   Patient: [name] MRN: [id number] DOB: 1925/08/02  Admit date:     03/30/2024  Discharge date: 04/07/24  Discharge Physician: Alm Sereniti Wan   PCP: Alphonsa Glendia LABOR, MD   Recommendations at discharge:   Please follow up with primary care provider within 1-2 weeks  Please repeat BMP and CBC in one week    Hospital Course: 89 y.o. male, with a history of asthma, hypertension, gouty arthritis, and hyperlipidemia , systolic CHF with EF 20 to 25%, recurrent right pleural effusion due to CHF. - Patient presents to ED secondary to shortness of breath and weakness, as well as inability to urinate, pleural effusion, hypoxia placed on oxygen  on the day prior to admission.  Hereports this morning still with significant dyspnea, he denies any fever, chills, reports his chronic cough unchanged. - in ED  his workup was significant for small pleural effusion, pulmonary elevated at 94, lactic acid of 3.6, creatinine at baseline 1.44, to have retention, Foley catheter inserted with 600 cc of immediate urine output, was hypotensive 76/59, he responded to fluid bolus, chest x-ray significant for mild left pneumothorax left lung base opacity, [facility] consulted to admit. Reviewed chest x-ray showed resolution of his pneumothorax.  The patient was noted to be hypotension.  He was fluid resuscitated with improvement of his blood pressure.  The patient was started on Valtrex  for shingles on around his left flank area.  The patient finished a full course of Valtrex  on the evening of 04/05/2024. Overall, the patient's shingles lesions have involuted without any open blisters or draining wounds.  His pain has completely resolved.  Ultimately, the patient was removed off of airborne isolation on 04/06/2024. The patient remained hemodynamically stable albeit with soft blood pressures on midodrine .  The patient remained stable on room air without any respiratory distress.   Initially, family wanted to pursue SNF.  However, did not decide to take the patient home with home health services.  Assessment and Plan:  Hypovolemic shock - Pt presented Hypotensive, with elevated lactic acid, tachycardic - He had an excellent response to fluid - procalcitonin 0.16 very low likelihood of bacterial infection, DC antibiotics  -The patient remained afebrile -His blood pressures remained soft>> midodrine  was started - 01/18/2024 echo EF 20 to 25%, I have discussed this with daughter, please see discussion under goals of care, currently we are limited, and advanced age, gentle hydration will monitor closely, continue on midodrine  for hypotension, not a candidate for advanced therapies. - Hold antihypertensive medications until BP recovers - discussion with daughter POA that he qualifies for hospice and she verbalized understanding and agreeable to hospice  - palliative team consulted - initial plan was to d/c to SNF with palliative services - family now prefers to take patient home>>HHPT and RN set up   Hypotension - he has required scheduled midodrine  15 mg TID to maintain BPs - after discussion with family plan is not to escalate care further with plans to transition to hospice care  - BPs remain soft but stabilized on midodrine  and holding GDMT - VS 98.2--92-16-100/64- 100% on RA at 1100 on day of dc   Shingles--LESIONS have involuted without open blisters or draining wounds - Left chest area in a dermatomal pattern - Last dose of Valtrex  04/05/2024 evening -Discontinue airborne isolation 04/06/2024 AM - increase pregabalin  25 mg to bid for pain associated with shingles - added lidocaine  patch 5% for pain symptoms - acetaminophen  1000mg  q 8 hours -  discussed with daughter who wants to avoid over sedation.  We discussed oxycodone  5 mg q 6 hr prn for only severe pain   Lactic acidosis--RESOLVED  -- resolved with IV fluid resuscitation   Urinary retention  - Foley  catheter inserted, started on Flomax   - failed voiding trial, re-insert foley 1/19 - d/c home with foley - follow up urology for voiding trial--I have requested [facility] to contact patient/family for appointment   CKD stage IIIb - Creatinine at baseline -Baseline creatinine 1.1-1.4   SVT - Patient had SVT while in the ED, converted to NSR without intervention - no recurrent SVT during hospitalization   chronic HFrEF - 01/18/2024 echo EF 20-25%, normal RVF, - Hold GDMT medications including Aldactone , Farxiga  and losartan  given low blood pressure   Recurrent Left pleural effusion Pneumothorax - S/p thoracentesis 03/29/24 where 1.8 L fluid was drained, this is most likely in the setting of CHF, no labs were sent on that effusion. - Chest x-ray with small pneumothorax, ED discussed with Dr. Annella, who recommended to start on oxygen  and repeat x-ray in a.m. - repeat CXR 1/16 no findings of pneumothorax reported - review of medical record shows pt has required intermittent thoracocenteses about once a month - now stable on RA at 100%   Failure to thrive - Continue with mirtazapine  - supplements - liberalized diet    Goals of care  -Discussed with daughters including HCPOA, who is an [employer], continue with current measures with no escalation of care, regarding low EF she understands he is not a candidate for advanced therapies, we will hold his antihypertensive medications, started on midodrine , with no escalation of care, no central lines, if he decompensates/deteriorates despite that then plan to proceed with full comfort measures, palliative medicine consulted -pt stabilized--initially planned for SNF, but family now prefers to take pt home      Consultants: none Procedures performed: none  Disposition: Home Diet recommendation:  Regular diet DISCHARGE MEDICATION: Allergies as of 04/07/2024       Reactions   Penicillin G Benzathine    Other reaction(s): Unknown    Penicillins Rash        Medication List     STOP taking these medications    acetaminophen  650 MG CR tablet Commonly known as: TYLENOL  Replaced by: acetaminophen  500 MG tablet   losartan  25 MG tablet Commonly known as: COZAAR    mupirocin  ointment 2 % Commonly known as: BACTROBAN    potassium chloride  10 MEQ tablet Commonly known as: KLOR-CON  M       TAKE these medications    acetaminophen  500 MG tablet Commonly known as: TYLENOL  Take 2 tablets (1,000 mg total) by mouth every 8 (eight) hours. Replaces: acetaminophen  650 MG CR tablet   albuterol  108 (90 Base) MCG/ACT inhaler Commonly known as: VENTOLIN  HFA Inhale 2 puffs into the lungs every 4 (four) hours as needed for wheezing or shortness of breath.   aspirin  81 MG chewable tablet Chew 1 tablet (81 mg total) by mouth daily.   dapagliflozin  propanediol 5 MG Tabs tablet Commonly known as: Farxiga  Take 1 tablet (5 mg total) by mouth daily before breakfast.   feeding supplement Liqd Take 237 mLs by mouth 2 (two) times daily between meals.   furosemide  20 MG tablet Commonly known as: Lasix  Take 1 tablet (20 mg total) by mouth daily.   lidocaine  5 % Commonly known as: LIDODERM  Place 1 patch onto the skin daily. Remove & Discard patch within 12 hours or as  directed by MD   midodrine  5 MG tablet Commonly known as: PROAMATINE  Take 3 tablets (15 mg total) by mouth 3 (three) times daily with meals.   mirtazapine  7.5 MG tablet Commonly known as: REMERON  Take 1 tablet (7.5 mg total) by mouth at bedtime.   oxyCODONE  5 MG immediate release tablet Commonly known as: Oxy IR/ROXICODONE  Take 1 tablet (5 mg total) by mouth every 6 (six) hours as needed for severe pain (pain score 7-10).   pregabalin  25 MG capsule Commonly known as: LYRICA  Take 1 capsule (25 mg total) by mouth 2 (two) times daily.   rosuvastatin  20 MG tablet Commonly known as: CRESTOR  TAKE ONE TABLET BY MOUTH EVERY DAY   spironolactone  25 MG  tablet Commonly known as: ALDACTONE  TAKE 1/2 TABLET BY MOUTH EVERY DAY        Contact information for after-discharge care     Home Medical Care     [facility]) .   Service: Home Health Services Contact information: [address] [place]  [address] [phone]                    Discharge Exam: Fredricka Weights   03/30/24 1139 03/31/24 0400  Weight: 65.8 kg 62.2 kg   HEENT:  [place]/AT, No thrush, no icterus CV:  RRR, no rub, no S3, no S4 Lung:  bibasilar crackles. No wheeze Abd:  soft/+BS, NT Ext:  No edema, no lymphangitis, no synovitis, no rash   Condition at discharge: stable  The results of significant diagnostics from this hospitalization (including imaging, microbiology, ancillary and laboratory) are listed below for reference.   Imaging Studies: DG CHEST PORT 1 VIEW Result Date: 03/31/2024 CLINICAL DATA:  Pneumothorax EXAM: PORTABLE CHEST 1 VIEW COMPARISON:  Yesterday FINDINGS: Stable cardiomegaly. Moderate left basilar pleural effusion. Minimal right basilar subsegmental atelectasis or scarring. IMPRESSION: Moderate left basilar pleural effusion. Electronically Signed   By: Lynwood Landy Raddle M.D.   On: 03/31/2024 09:29   DG Chest Portable 1 View Result Date: 03/30/2024 EXAM: 1 VIEW(S) XRAY OF THE CHEST 03/30/2024 11:59:53 AM COMPARISON: 03/29/2024 CLINICAL HISTORY: dyspnea dyspnea dyspnea FINDINGS: LUNGS AND PLEURA: Small left pneumothorax. Elevated left hemidiaphragm. Patchy opacity left lung base with associated pleural effusion. HEART AND MEDIASTINUM: Cardiomegaly, unchanged. Calcified aorta. BONES AND SOFT TISSUES: No acute osseous abnormality. IMPRESSION: 1. Small left pneumothorax. 2. Patchy opacity at the left lung base with associated pleural effusion and elevated left hemidiaphragm. 3. Cardiomegaly, unchanged. Calcified aorta. Electronically signed by: Evalene Coho MD 03/30/2024 12:17 PM EST RP  Workstation: [id number]   US  THORACENTESIS LEFT ASP PLEURAL SPACE W/IMG GUIDE Result Date: 03/29/2024 INDICATION: 89 year old male. History of CHF with recurrent left-sided pleural effusion. Request is for therapeutic thoracentesis. EXAM: ULTRASOUND GUIDED THERAPEUTIC LEFT-SIDED THORACENTESIS MEDICATIONS: Lidocaine  1% 10 mL COMPLICATIONS: None immediate. PROCEDURE: An ultrasound guided thoracentesis was thoroughly discussed with the patient and questions answered. The benefits, risks, alternatives and complications were also discussed. The patient understands and wishes to proceed with the procedure. Written consent was obtained. Ultrasound was performed to localize and mark an adequate pocket of fluid in the left chest. The area was then prepped and draped in the normal sterile fashion. 1% Lidocaine  was used for local anesthesia. Under ultrasound guidance a 6 Fr Safe-T-Centesis catheter was introduced. Thoracentesis was performed. The catheter was removed and a dressing applied. FINDINGS: A total of approximately 1.5 L of serosanguineous fluid was removed. IMPRESSION: Successful ultrasound guided therapeutic left-sided thoracentesis  yielding 1.8 of serosanguineous pleural fluid. Performed by Delon Beagle NP Electronically Signed   By: Ester Sides M.D.   On: 03/29/2024 14:43   DG Chest Port 1 View Result Date: 03/29/2024 EXAM: 1 VIEW(S) XRAY OF THE CHEST 03/29/2024 01:45:42 PM COMPARISON: 03/27/2024 CLINICAL HISTORY: S/P thoracentesis. FINDINGS: LUNGS AND PLEURA: Decreased left pleural effusion status post thoracentesis. No significant pneumothorax identified. Persistent left lung base atelectasis. Trace right pleural effusion. Decreased interstitial edema. No focal pulmonary opacity. HEART AND MEDIASTINUM: Cardiomegaly, stable. BONES AND SOFT TISSUES: No acute osseous abnormality. IMPRESSION: 1. Decreased left pleural effusion status post thoracentesis. No significant pneumothorax identified. 2.  Persistent left lung base atelectasis. 3. Trace right pleural effusion. Electronically signed by: Waddell Calk MD 03/29/2024 02:28 PM EST RP Workstation: [id number]   DG Chest 2 View Result Date: 03/27/2024 EXAM: 2 VIEW(S) XRAY OF THE CHEST 03/27/2024 02:07:00 PM COMPARISON: 02/07/2024 CLINICAL HISTORY: pleural effusion left side also history CHF not tachypneic FINDINGS: LUNGS AND PLEURA: Increased large left pleural effusion. Associated overlying left mid to lower lung opacities, likely atelectasis and/or pneumonia. Blunting of the right costophrenic angle. No pneumothorax. HEART AND MEDIASTINUM: Cardiomegaly. Rightward mediastinal shift. BONES AND SOFT TISSUES: Degenerative changes. IMPRESSION: 1. Increased large left pleural effusion with associated overlying left mid to lower lung opacities, likely atelectasis and/or pneumonia. 2. Blunting of the right costophrenic angle, which may reflect a small right pleural effusion. 3. Cardiomegaly and rightward mediastinal shift. Electronically signed by: Waddell Calk MD MD 03/27/2024 03:34 PM EST RP Workstation: [id number]    Microbiology: Results for orders placed or performed during the hospital encounter of 03/30/24  Blood Culture (routine x 2)     Status: None   Collection Time: 03/30/24 12:37 PM   Specimen: BLOOD  Result Value Ref Range Status   Specimen Description BLOOD BLOOD RIGHT ARM  Final   Special Requests   Final    BOTTLES DRAWN AEROBIC ONLY Blood Culture adequate volume   Culture   Final    NO GROWTH 5 DAYS Performed at [facility], [address], [place], [place] [address]    Report Status 04/04/2024 FINAL  Final  Blood Culture (routine x 2)     Status: None   Collection Time: 03/30/24  1:00 PM   Specimen: BLOOD  Result Value Ref Range Status   Specimen Description BLOOD BLOOD LEFT ARM  Final   Special Requests   Final    BOTTLES DRAWN AEROBIC AND ANAEROBIC Blood Culture results may not be optimal due to an inadequate volume of  blood received in culture bottles   Culture   Final    NO GROWTH 5 DAYS Performed at [facility], [address], [place], [place] [address]    Report Status 04/04/2024 FINAL  Final  Resp panel by RT-PCR (RSV, Flu A&B, Covid) Urine, Clean Catch     Status: None   Collection Time: 03/30/24  1:57 PM   Specimen: Urine, Clean Catch; Nasal Swab  Result Value Ref Range Status   SARS Coronavirus 2 by RT PCR NEGATIVE NEGATIVE Final    Comment: (NOTE) SARS-CoV-2 target nucleic acids are NOT DETECTED.  The SARS-CoV-2 RNA is generally detectable in upper respiratory specimens during the acute phase of infection. The lowest concentration of SARS-CoV-2 viral copies this assay can detect is 138 copies/mL. A negative result does not preclude SARS-Cov-2 infection and should not be used as the sole basis for treatment or other patient management decisions. A negative result may occur with  improper specimen collection/handling, submission of specimen other than nasopharyngeal swab, presence of viral mutation(s) within the areas targeted by this assay, and inadequate number of viral copies(<138 copies/mL). A negative result must be combined with clinical observations, patient history, and epidemiological information. The expected result is Negative.  Fact Sheet for Patients:  bloggercourse.com  Fact Sheet for Healthcare Providers:  seriousbroker.it  This test is no t yet approved or cleared by the [place]  FDA and  has been authorized for detection and/or diagnosis of SARS-CoV-2 by FDA under an Emergency Use Authorization (EUA). This EUA will remain  in effect (meaning this test can be used) for the duration of the COVID-19 declaration under Section 564(b)(1) of the Act, 21 U.S.C.section 360bbb-3(b)(1), unless the authorization is terminated  or revoked sooner.       Influenza A by PCR NEGATIVE NEGATIVE Final   Influenza B by PCR  NEGATIVE NEGATIVE Final    Comment: (NOTE) The Xpert Xpress SARS-CoV-2/FLU/RSV plus assay is intended as an aid in the diagnosis of influenza from Nasopharyngeal swab specimens and should not be used as a sole basis for treatment. Nasal washings and aspirates are unacceptable for Xpert Xpress SARS-CoV-2/FLU/RSV testing.  Fact Sheet for Patients: bloggercourse.com  Fact Sheet for Healthcare Providers: seriousbroker.it  This test is not yet approved or cleared by the [place]  FDA and has been authorized for detection and/or diagnosis of SARS-CoV-2 by FDA under an Emergency Use Authorization (EUA). This EUA will remain in effect (meaning this test can be used) for the duration of the COVID-19 declaration under Section 564(b)(1) of the Act, 21 U.S.C. section 360bbb-3(b)(1), unless the authorization is terminated or revoked.     Resp Syncytial Virus by PCR NEGATIVE NEGATIVE Final    Comment: (NOTE) Fact Sheet for Patients: bloggercourse.com  Fact Sheet for Healthcare Providers: seriousbroker.it  This test is not yet approved or cleared by the [place]  FDA and has been authorized for detection and/or diagnosis of SARS-CoV-2 by FDA under an Emergency Use Authorization (EUA). This EUA will remain in effect (meaning this test can be used) for the duration of the COVID-19 declaration under Section 564(b)(1) of the Act, 21 U.S.C. section 360bbb-3(b)(1), unless the authorization is terminated or revoked.  Performed at [facility], [address] [place], [place] [address]   MRSA Next Gen by PCR, Nasal     Status: None   Collection Time: 03/31/24  4:00 AM   Specimen: Nasal Mucosa; Nasal Swab  Result Value Ref Range Status   MRSA by PCR Next Gen NOT DETECTED NOT DETECTED Final    Comment: (NOTE) The GeneXpert MRSA Assay (FDA approved for NASAL specimens only), is one  component of a comprehensive MRSA colonization surveillance program. It is not intended to diagnose MRSA infection nor to guide or monitor treatment for MRSA infections. Test performance is not FDA approved in patients less than 13 years old. Performed at [facility], [address], [place], [place] [address]     Labs: CBC: Recent Labs  Lab 04/01/24 0551  WBC 7.0  HGB 9.1*  HCT 30.6*  MCV 102.7*  PLT 141*   Basic Metabolic Panel: Recent Labs  Lab 04/01/24 0551  NA 139  K 3.8  CL 104  CO2 25  GLUCOSE 85  BUN 17  CREATININE 1.01  CALCIUM  8.4*  MG 2.2   Liver Function Tests: No results for input(s): AST, ALT, ALKPHOS, BILITOT, PROT, ALBUMIN in the last 168 hours. CBG: Recent Labs  Lab 03/31/24 2217  04/06/24 0738  GLUCAP 111* 85    Discharge time spent: greater than 30 minutes.  Signed: Alm Schneider, MD [facility] 04/07/2024 "

## 2024-04-07 NOTE — Care Management Important Message (Signed)
 Important Message  Patient Details  Name: [name] MRN: [id number] Date of Birth: 05-23-1925   Important Message Given:  Yes - Medicare IM     Ikia Cincotta L Donabelle Molden 04/07/2024, 12:13 PM

## 2024-04-07 NOTE — Plan of Care (Signed)
 " Problem: Fluid Volume: Goal: Hemodynamic stability will improve 04/07/2024 1445 by Elner Ames MATSU, RN Outcome: Progressing 04/07/2024 1416 by Elner Ames MATSU, RN Outcome: Progressing   Problem: Clinical Measurements: Goal: Diagnostic test results will improve 04/07/2024 1445 by Elner Ames MATSU, RN Outcome: Progressing 04/07/2024 1416 by Elner Ames MATSU, RN Outcome: Progressing Goal: Signs and symptoms of infection will decrease 04/07/2024 1445 by Elner Ames MATSU, RN Outcome: Progressing 04/07/2024 1416 by Elner Ames MATSU, RN Outcome: Progressing   Problem: Respiratory: Goal: Ability to maintain adequate ventilation will improve 04/07/2024 1445 by Elner Ames MATSU, RN Outcome: Progressing 04/07/2024 1416 by Elner Ames MATSU, RN Outcome: Progressing   Problem: Clinical Measurements: Goal: Ability to maintain clinical measurements within normal limits will improve 04/07/2024 1445 by Elner Ames MATSU, RN Outcome: Progressing 04/07/2024 1416 by Elner Ames MATSU, RN Outcome: Progressing Goal: Will remain free from infection 04/07/2024 1445 by Elner Ames MATSU, RN Outcome: Progressing 04/07/2024 1416 by Elner Ames MATSU, RN Outcome: Progressing Goal: Diagnostic test results will improve 04/07/2024 1445 by Elner Ames MATSU, RN Outcome: Progressing 04/07/2024 1416 by Elner Ames MATSU, RN Outcome: Progressing Goal: Respiratory complications will improve 04/07/2024 1445 by Elner Ames MATSU, RN Outcome: Progressing 04/07/2024 1416 by Elner Ames MATSU, RN Outcome: Progressing Goal: Cardiovascular complication will be avoided 04/07/2024 1445 by Elner Ames MATSU, RN Outcome: Progressing 04/07/2024 1416 by Elner Ames MATSU, RN Outcome: Progressing   Problem: Activity: Goal: Risk for activity intolerance will decrease 04/07/2024 1445 by Elner Ames MATSU, RN Outcome: Progressing 04/07/2024 1416 by Elner Ames MATSU, RN Outcome: Progressing   Problem:  Nutrition: Goal: Adequate nutrition will be maintained 04/07/2024 1445 by Elner Ames MATSU, RN Outcome: Progressing 04/07/2024 1416 by Elner Ames MATSU, RN Outcome: Progressing   Problem: Elimination: Goal: Will not experience complications related to bowel motility 04/07/2024 1445 by Elner Ames MATSU, RN Outcome: Progressing 04/07/2024 1416 by Elner Ames MATSU, RN Outcome: Progressing Goal: Will not experience complications related to urinary retention 04/07/2024 1445 by Elner Ames MATSU, RN Outcome: Progressing 04/07/2024 1416 by Elner Ames MATSU, RN Outcome: Progressing   Problem: Pain Managment: Goal: General experience of comfort will improve and/or be controlled 04/07/2024 1445 by Elner Ames MATSU, RN Outcome: Progressing 04/07/2024 1416 by Elner Ames MATSU, RN Outcome: Progressing   Problem: Safety: Goal: Ability to remain free from injury will improve 04/07/2024 1445 by Elner Ames MATSU, RN Outcome: Progressing 04/07/2024 1416 by Elner Ames MATSU, RN Outcome: Progressing   Problem: Skin Integrity: Goal: Risk for impaired skin integrity will decrease 04/07/2024 1445 by Elner Ames MATSU, RN Outcome: Progressing 04/07/2024 1416 by Elner Ames MATSU, RN Outcome: Progressing   Problem: Fluid Volume: Goal: Hemodynamic stability will improve 04/07/2024 1445 by Elner Ames MATSU, RN Outcome: Progressing 04/07/2024 1416 by Elner Ames MATSU, RN Outcome: Progressing   Problem: Clinical Measurements: Goal: Diagnostic test results will improve 04/07/2024 1445 by Elner Ames MATSU, RN Outcome: Progressing 04/07/2024 1416 by Elner Ames MATSU, RN Outcome: Progressing Goal: Signs and symptoms of infection will decrease 04/07/2024 1445 by Elner Ames MATSU, RN Outcome: Progressing 04/07/2024 1416 by Elner Ames MATSU, RN Outcome: Progressing   Problem: Respiratory: Goal: Ability to maintain adequate ventilation will improve 04/07/2024 1445 by Elner Ames MATSU,  RN Outcome: Progressing 04/07/2024 1416 by Elner Ames MATSU, RN Outcome: Progressing   Problem: Clinical Measurements: Goal: Ability to maintain clinical measurements within normal limits will improve 04/07/2024 1445 by Elner Ames MATSU, RN Outcome: Progressing 04/07/2024 1416 by Elner Ames MATSU, RN Outcome: Progressing  Goal: Will remain free from infection 04/07/2024 1445 by Elner Ames MATSU, RN Outcome: Progressing 04/07/2024 1416 by Elner Ames MATSU, RN Outcome: Progressing Goal: Diagnostic test results will improve 04/07/2024 1445 by Elner Ames MATSU, RN Outcome: Progressing 04/07/2024 1416 by Elner Ames MATSU, RN Outcome: Progressing Goal: Respiratory complications will improve 04/07/2024 1445 by Elner Ames MATSU, RN Outcome: Progressing 04/07/2024 1416 by Elner Ames MATSU, RN Outcome: Progressing Goal: Cardiovascular complication will be avoided 04/07/2024 1445 by Elner Ames MATSU, RN Outcome: Progressing 04/07/2024 1416 by Elner Ames MATSU, RN Outcome: Progressing   Problem: Activity: Goal: Risk for activity intolerance will decrease 04/07/2024 1445 by Elner Ames MATSU, RN Outcome: Progressing 04/07/2024 1416 by Elner Ames MATSU, RN Outcome: Progressing   Problem: Nutrition: Goal: Adequate nutrition will be maintained 04/07/2024 1445 by Elner Ames MATSU, RN Outcome: Progressing 04/07/2024 1416 by Elner Ames MATSU, RN Outcome: Progressing   Problem: Elimination: Goal: Will not experience complications related to bowel motility 04/07/2024 1445 by Elner Ames MATSU, RN Outcome: Progressing 04/07/2024 1416 by Elner Ames MATSU, RN Outcome: Progressing Goal: Will not experience complications related to urinary retention 04/07/2024 1445 by Elner Ames MATSU, RN Outcome: Progressing 04/07/2024 1416 by Elner Ames MATSU, RN Outcome: Progressing   Problem: Pain Managment: Goal: General experience of comfort will improve and/or be controlled 04/07/2024 1445 by  Elner Ames MATSU, RN Outcome: Progressing 04/07/2024 1416 by Elner Ames MATSU, RN Outcome: Progressing   Problem: Safety: Goal: Ability to remain free from injury will improve 04/07/2024 1445 by Elner Ames MATSU, RN Outcome: Progressing 04/07/2024 1416 by Elner Ames MATSU, RN Outcome: Progressing   Problem: Skin Integrity: Goal: Risk for impaired skin integrity will decrease 04/07/2024 1445 by Elner Ames MATSU, RN Outcome: Progressing 04/07/2024 1416 by Elner Ames MATSU, RN Outcome: Progressing   "

## 2024-04-07 NOTE — Plan of Care (Signed)
" °  Problem: Fluid Volume: Goal: Hemodynamic stability will improve Outcome: Progressing   Problem: Clinical Measurements: Goal: Diagnostic test results will improve Outcome: Progressing Goal: Signs and symptoms of infection will decrease Outcome: Progressing   Problem: Respiratory: Goal: Ability to maintain adequate ventilation will improve Outcome: Progressing   Problem: Clinical Measurements: Goal: Ability to maintain clinical measurements within normal limits will improve Outcome: Progressing Goal: Will remain free from infection Outcome: Progressing Goal: Diagnostic test results will improve Outcome: Progressing Goal: Respiratory complications will improve Outcome: Progressing Goal: Cardiovascular complication will be avoided Outcome: Progressing   Problem: Activity: Goal: Risk for activity intolerance will decrease Outcome: Progressing   Problem: Nutrition: Goal: Adequate nutrition will be maintained Outcome: Progressing   Problem: Elimination: Goal: Will not experience complications related to bowel motility Outcome: Progressing Goal: Will not experience complications related to urinary retention Outcome: Progressing   Problem: Pain Managment: Goal: General experience of comfort will improve and/or be controlled Outcome: Progressing   Problem: Safety: Goal: Ability to remain free from injury will improve Outcome: Progressing   Problem: Skin Integrity: Goal: Risk for impaired skin integrity will decrease Outcome: Progressing   "

## 2024-04-07 NOTE — Plan of Care (Signed)
" °  Problem: Respiratory: Goal: Ability to maintain adequate ventilation will improve Outcome: Progressing   Problem: Activity: Goal: Risk for activity intolerance will decrease Outcome: Progressing   Problem: Pain Managment: Goal: General experience of comfort will improve and/or be controlled Outcome: Progressing   Problem: Safety: Goal: Ability to remain free from injury will improve Outcome: Progressing   Problem: Skin Integrity: Goal: Risk for impaired skin integrity will decrease Outcome: Progressing   "

## 2024-04-10 ENCOUNTER — Encounter: Payer: Self-pay | Admitting: Family Medicine

## 2024-04-10 ENCOUNTER — Telehealth: Payer: Self-pay | Admitting: *Deleted

## 2024-04-10 ENCOUNTER — Telehealth: Payer: Self-pay

## 2024-04-10 NOTE — Transitions of Care (Post Inpatient/ED Visit) (Signed)
 "  04/10/2024  Name: [name] MRN: [id number] DOB: [birth year]  Today's TOC FU Call Status: Today's TOC FU Call Status:: Successful TOC FU Call Completed TOC FU Call Complete Date: 04/10/24  Patient's Name and Date of Birth confirmed. Name, DOB  Transition Care Management Follow-up Telephone Call How have you been since you were released from the hospital?: Better Any questions or concerns?: No  Items Reviewed: Did you receive and understand the discharge instructions provided?: Yes Medications obtained,verified, and reconciled?: Yes (Medications Reviewed) Any new allergies since your discharge?: No Dietary orders reviewed?: Yes Type of Diet Ordered:: low salt heart healthy diet Miles you have support at home?: Yes People in Home [RPT]: child(ren), adult Name of Support/Comfort Primary Source: daughter and neice  Medications Reviewed Today: Medications Reviewed Today     Reviewed by Troy Troy PENNER, RN (Registered Nurse) on 04/10/24 at 1407  Med List Status: <None>   Medication Order Taking? Sig Documenting Provider Last Dose Status Informant  acetaminophen  (TYLENOL ) 500 MG tablet [id number] Yes Take 2 tablets (1,000 mg total) by mouth every 8 (eight) hours. Troy Lenis, Miles  Active   albuterol  (VENTOLIN  HFA) 108 (90 Base) MCG/ACT inhaler [id number] Yes Inhale 2 puffs into the lungs every 4 (four) hours as needed for wheezing or shortness of breath. Troy Glendia LABOR, Miles  Active Self, Pharmacy Records  aspirin  81 MG chewable tablet [id number] Yes Chew 1 tablet (81 mg total) by mouth daily. Troy Lenis, Miles  Active Self, Pharmacy Records  dapagliflozin  propanediol (FARXIGA ) 5 MG TABS tablet [id number] Yes Take 1 tablet (5 mg total) by mouth daily before breakfast. Troy Glendia LABOR, Miles  Active Self, Pharmacy Records  feeding supplement (ENSURE PLUS HIGH PROTEIN) LIQD [id number] Yes Take 237 mLs by mouth 2 (two) times daily between meals. Troy Lenis, Miles  Active   furosemide  (LASIX ) 20 MG  tablet [id number] Yes Take 1 tablet (20 mg total) by mouth daily. Troy Vina GAILS, Miles  Active Self, Pharmacy Records  lidocaine  (LIDODERM ) 5 % [id number] Yes Place 1 patch onto the skin daily. Remove & Discard patch within 12 hours or as directed by Miles [name]  Active   midodrine  (PROAMATINE ) 5 MG tablet [id number] Yes Take 3 tablets (15 mg total) by mouth 3 (three) times daily with meals. Troy Lenis, Miles  Active   mirtazapine  (REMERON ) 7.5 MG tablet [id number] Yes Take 1 tablet (7.5 mg total) by mouth at bedtime. [name]  Active Self, Pharmacy Records  oxyCODONE  (OXY IR/ROXICODONE ) 5 MG immediate release tablet [id number] Yes Take 1 tablet (5 mg total) by mouth every 6 (six) hours as needed for severe pain (pain score 7-10). Troy Lenis, Miles  Active   pregabalin  (LYRICA ) 25 MG capsule [id number] Yes Take 1 capsule (25 mg total) by mouth 2 (two) times daily. Troy Lenis, Miles  Active   rosuvastatin  (CRESTOR ) 20 MG tablet [id number] Yes TAKE ONE TABLET BY MOUTH EVERY DAY Troy Glendia LABOR, Miles  Active Self, Pharmacy Records  spironolactone  (ALDACTONE ) 25 MG tablet [id number] Yes TAKE 1/2 TABLET BY MOUTH EVERY DAY Troy Glendia LABOR, Miles  Active Self, Pharmacy Records  Med List Note ([name] 11/[birth year]/19 2029): Patient has been vaccinated per spouse but is not sure of the date or name            Home Care and Equipment/Supplies: Were Home Health Services Ordered?: Yes Name of Home Health Agency:: [facility] Has Agency set up a time to come  to your home?: Yes First Home Health Visit Date: 04/13/24 Any new equipment or medical supplies ordered?: Yes Name of Medical supply agency?: [facility]   [name] Were you able to get the equipment/medical supplies?: Yes Miles you have any questions related to the use of the equipment/supplies?: No  Functional Questionnaire: Miles you need assistance with bathing/showering or dressing?: Yes Miles you need assistance with meal preparation?: Yes Miles you need assistance with  eating?: No Miles you have difficulty maintaining continence: No Miles you need assistance with getting out of bed/getting out of a chair/moving?: No Miles you have difficulty managing or taking your medications?: Yes (family)  Follow up appointments reviewed: PCP Follow-up appointment confirmed?: No (daughter will call) Miles Provider Line [phone] Given: No Specialist Hospital Follow-up appointment confirmed?: Yes Date of Specialist follow-up appointment?: 04/11/24 Follow-Up Specialty Provider:: [facility] Miles you need transportation to your follow-up appointment?: No Miles you understand care options if your condition(s) worsen?: Yes-patient verbalized understanding  SDOH Interventions Today    Flowsheet Row Most Recent Value  SDOH Interventions   Food Insecurity Interventions Intervention Not Indicated  Housing Interventions Intervention Not Indicated  Transportation Interventions Intervention Not Indicated  Utilities Interventions Intervention Not Indicated   Placed call to patient and spoke with daughter [name].  Daughter reports that patient is doing well. Reports no new concerns. States that home health is coming on Thursday ( due to weather) . Reviewed all discharge instructions and medications. Reviewed with daughter that family is doing all care including dressing changes. Family in the process of getting PCS services.   Reviewed importance of daily weights and low salt diet.  Encouraged follow up appointment with PCP and [facility] that is scheduled for tomorrow.   Reviewed and offered 30 day TOC program and daughter declined.  Encouraged daughter to call Miles for any concerns.  Troy Ee, RN, BSN, CEN Population Health- Transition of Care Team.  [facility] [phone]  "

## 2024-04-10 NOTE — Transitions of Care (Post Inpatient/ED Visit) (Signed)
" ° °  04/10/2024  Name: [name] MRN: [id number] DOB: [birth year]  Today's TOC FU Call Status: Today's TOC FU Call Status:: Unsuccessful Call (1st Attempt) Unsuccessful Call (1st Attempt) Date: 04/10/24  Attempted to reach the patient regarding the most recent Inpatient/ED visit.  Follow Up Plan: Additional outreach attempts will be made to reach the patient to complete the Transitions of Care (Post Inpatient/ED visit) call.   Alan Ee, RN, BSN, CEN Population Health- Transition of Care Team.  [facility] [phone]  "

## 2024-04-10 NOTE — Transitions of Care (Post Inpatient/ED Visit) (Signed)
" ° °  04/10/2024  Name: ISHMEL ACEVEDO MRN: [id number] DOB: 1925/11/25  Today's TOC FU Call Status: Today's TOC FU Call Status:: Unsuccessful Call (2nd Attempt) Unsuccessful Call (2nd Attempt) Date: 04/10/24  Attempted to reach the patient regarding the most recent Inpatient/ED visit.  Follow Up Plan: Additional outreach attempts will be made to reach the patient to complete the Transitions of Care (Post Inpatient/ED visit) call.   Alan Ee, RN, BSN, CEN Population Health- Transition of Care Team.  [facility] [phone]  "
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
## 2024-04-11 ENCOUNTER — Ambulatory Visit: Admitting: Podiatry

## 2024-04-11 NOTE — Telephone Encounter (Signed)
 Nurses What we need is the FL 2 form Also find out from the family what type is aspects will these home health be helping him with Please print off a FL 2 then I can help fill it out thank you  Food prep?  Eating?  Bathing?  Changing close?  Taking medications?  The more information the better

## 2024-04-17 ENCOUNTER — Encounter: Payer: Self-pay | Admitting: Family Medicine

## 2024-04-18 ENCOUNTER — Inpatient Hospital Stay (HOSPITAL_COMMUNITY): Admission: EM | Admit: 2024-04-18 | Source: Home / Self Care

## 2024-04-18 ENCOUNTER — Emergency Department (HOSPITAL_COMMUNITY)

## 2024-04-18 ENCOUNTER — Other Ambulatory Visit: Payer: Self-pay | Admitting: Family Medicine

## 2024-04-18 ENCOUNTER — Telehealth: Payer: Self-pay | Admitting: Family Medicine

## 2024-04-18 ENCOUNTER — Encounter (HOSPITAL_COMMUNITY): Payer: Self-pay

## 2024-04-18 ENCOUNTER — Telehealth: Payer: Self-pay

## 2024-04-18 ENCOUNTER — Other Ambulatory Visit: Payer: Self-pay

## 2024-04-18 DIAGNOSIS — J9601 Acute respiratory failure with hypoxia: Principal | ICD-10-CM

## 2024-04-18 DIAGNOSIS — J189 Pneumonia, unspecified organism: Secondary | ICD-10-CM | POA: Diagnosis present

## 2024-04-18 DIAGNOSIS — R55 Syncope and collapse: Secondary | ICD-10-CM

## 2024-04-18 LAB — COMPREHENSIVE METABOLIC PANEL WITH GFR
ALT: 9 U/L (ref 0–44)
AST: 26 U/L (ref 15–41)
Albumin: 3.3 g/dL — ABNORMAL LOW (ref 3.5–5.0)
Alkaline Phosphatase: 84 U/L (ref 38–126)
Anion gap: 14 (ref 5–15)
BUN: 13 mg/dL (ref 8–23)
CO2: 26 mmol/L (ref 22–32)
Calcium: 9.3 mg/dL (ref 8.9–10.3)
Chloride: 101 mmol/L (ref 98–111)
Creatinine, Ser: 1.2 mg/dL (ref 0.61–1.24)
GFR, Estimated: 55 mL/min — ABNORMAL LOW
Glucose, Bld: 98 mg/dL (ref 70–99)
Potassium: 4 mmol/L (ref 3.5–5.1)
Sodium: 141 mmol/L (ref 135–145)
Total Bilirubin: 1.3 mg/dL — ABNORMAL HIGH (ref 0.0–1.2)
Total Protein: 7.5 g/dL (ref 6.5–8.1)

## 2024-04-18 LAB — BLOOD GAS, VENOUS
Acid-Base Excess: 5.7 mmol/L — ABNORMAL HIGH (ref 0.0–2.0)
Bicarbonate: 33.2 mmol/L — ABNORMAL HIGH (ref 20.0–28.0)
Drawn by: 66297
O2 Saturation: 20.7 %
Patient temperature: 36.6
pCO2, Ven: 62 mmHg — ABNORMAL HIGH (ref 44–60)
pH, Ven: 7.34 (ref 7.25–7.43)
pO2, Ven: <31 mmHg — CL (ref 32–45)

## 2024-04-18 LAB — COOXEMETRY PANEL
Carboxyhemoglobin: 1.6 % — ABNORMAL HIGH (ref 0.5–1.5)
Methemoglobin: 1.1 % (ref 0.0–1.5)
O2 Saturation: 20.7 %
Total hemoglobin: 11.1 g/dL — ABNORMAL LOW (ref 12.0–16.0)

## 2024-04-18 LAB — TROPONIN T, HIGH SENSITIVITY
Troponin T High Sensitivity: 51 ng/L — ABNORMAL HIGH (ref 0–19)
Troponin T High Sensitivity: 51 ng/L — ABNORMAL HIGH (ref 0–19)

## 2024-04-18 LAB — I-STAT CHEM 8, ED
BUN: 13 mg/dL (ref 8–23)
Calcium, Ion: 1.17 mmol/L (ref 1.15–1.40)
Chloride: 100 mmol/L (ref 98–111)
Creatinine, Ser: 1.2 mg/dL (ref 0.61–1.24)
Glucose, Bld: 101 mg/dL — ABNORMAL HIGH (ref 70–99)
HCT: 35 % — ABNORMAL LOW (ref 39.0–52.0)
Hemoglobin: 11.9 g/dL — ABNORMAL LOW (ref 13.0–17.0)
Potassium: 3.9 mmol/L (ref 3.5–5.1)
Sodium: 141 mmol/L (ref 135–145)
TCO2: 30 mmol/L (ref 22–32)

## 2024-04-18 LAB — CBC WITH DIFFERENTIAL/PLATELET
Abs Immature Granulocytes: 0.04 10*3/uL (ref 0.00–0.07)
Basophils Absolute: 0 10*3/uL (ref 0.0–0.1)
Basophils Relative: 0 %
Eosinophils Absolute: 0 10*3/uL (ref 0.0–0.5)
Eosinophils Relative: 1 %
HCT: 36.5 % — ABNORMAL LOW (ref 39.0–52.0)
Hemoglobin: 10.8 g/dL — ABNORMAL LOW (ref 13.0–17.0)
Immature Granulocytes: 1 %
Lymphocytes Relative: 5 %
Lymphs Abs: 0.4 10*3/uL — ABNORMAL LOW (ref 0.7–4.0)
MCH: 30.8 pg (ref 26.0–34.0)
MCHC: 29.6 g/dL — ABNORMAL LOW (ref 30.0–36.0)
MCV: 104 fL — ABNORMAL HIGH (ref 80.0–100.0)
Monocytes Absolute: 0.5 10*3/uL (ref 0.1–1.0)
Monocytes Relative: 6 %
Neutro Abs: 7.6 10*3/uL (ref 1.7–7.7)
Neutrophils Relative %: 87 %
Platelets: 169 10*3/uL (ref 150–400)
RBC: 3.51 MIL/uL — ABNORMAL LOW (ref 4.22–5.81)
RDW: 14.3 % (ref 11.5–15.5)
WBC: 8.6 10*3/uL (ref 4.0–10.5)
nRBC: 0 % (ref 0.0–0.2)

## 2024-04-18 LAB — LACTIC ACID, PLASMA
Lactic Acid, Venous: 3 mmol/L (ref 0.5–1.9)
Lactic Acid, Venous: 1.9 mmol/L (ref 0.5–1.9)

## 2024-04-18 MED ORDER — SODIUM CHLORIDE 0.9 % IV SOLN
2.0000 g | Freq: Two times a day (BID) | INTRAVENOUS | Status: DC
  Administered 2024-04-18 – 2024-04-20 (×4): 2 g via INTRAVENOUS
  Filled 2024-04-18 (×4): qty 12.5

## 2024-04-18 MED ORDER — POLYETHYLENE GLYCOL 3350 17 G PO PACK: 17.0000 g | PACK | Freq: Every day | ORAL | Status: AC | PRN

## 2024-04-18 MED ORDER — SODIUM CHLORIDE 0.9 % IV BOLUS
500.0000 mL | Freq: Once | INTRAVENOUS | Status: AC
  Administered 2024-04-18: 500 mL via INTRAVENOUS

## 2024-04-18 MED ORDER — MIDODRINE HCL 5 MG PO TABS
10.0000 mg | ORAL_TABLET | ORAL | Status: AC
  Administered 2024-04-18: 10 mg via ORAL
  Filled 2024-04-18: qty 2

## 2024-04-18 MED ORDER — MIDODRINE HCL 5 MG PO TABS: 5.0000 mg | ORAL_TABLET | Freq: Three times a day (TID) | ORAL | Status: DC

## 2024-04-18 MED ORDER — SODIUM CHLORIDE 0.9 % IV BOLUS
250.0000 mL | INTRAVENOUS | Status: AC
  Administered 2024-04-18: 250 mL via INTRAVENOUS

## 2024-04-18 MED ORDER — LINEZOLID 600 MG/300ML IV SOLN
600.0000 mg | Freq: Two times a day (BID) | INTRAVENOUS | Status: DC
  Administered 2024-04-18 – 2024-04-20 (×4): 600 mg via INTRAVENOUS
  Filled 2024-04-18 (×7): qty 300

## 2024-04-18 MED ORDER — ACETAMINOPHEN 325 MG PO TABS: 650.0000 mg | ORAL_TABLET | Freq: Four times a day (QID) | ORAL | Status: AC | PRN

## 2024-04-18 MED ORDER — IOHEXOL 300 MG/ML  SOLN
100.0000 mL | Freq: Once | INTRAMUSCULAR | Status: AC | PRN
  Administered 2024-04-18: 100 mL via INTRAVENOUS

## 2024-04-18 MED ORDER — MELATONIN 3 MG PO TABS: 6.0000 mg | ORAL_TABLET | Freq: Every evening | ORAL | Status: AC | PRN

## 2024-04-18 MED ORDER — ENOXAPARIN SODIUM 40 MG/0.4ML IJ SOSY
40.0000 mg | PREFILLED_SYRINGE | INTRAMUSCULAR | Status: AC
  Administered 2024-04-18 – 2024-04-21 (×4): 40 mg via SUBCUTANEOUS
  Filled 2024-04-18 (×4): qty 0.4

## 2024-04-18 MED ORDER — PROCHLORPERAZINE EDISYLATE 10 MG/2ML IJ SOLN: 5.0000 mg | Freq: Four times a day (QID) | INTRAMUSCULAR | Status: AC | PRN

## 2024-04-18 MED ORDER — LACTATED RINGERS IV BOLUS
500.0000 mL | Freq: Once | INTRAVENOUS | Status: AC
  Administered 2024-04-18: 500 mL via INTRAVENOUS

## 2024-04-18 NOTE — Telephone Encounter (Signed)
 Not on current med list.
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
## 2024-04-18 NOTE — Telephone Encounter (Signed)
 May have verbal order

## 2024-04-18 NOTE — Telephone Encounter (Signed)
 Copied from CRM #[id number]. Topic: General - Other >> Apr 18, 2024 10:45 AM Willma SAUNDERS wrote: Reason for CRM: Patients daughter Ava is requesting a callback from the office. Wants to discuss the appointment we scheduled for her father for a hospital follow up.  Ava can be reached at [phone]

## 2024-04-18 NOTE — Telephone Encounter (Signed)
 Copied from CRM [id number]. Topic: Clinical - Home Health Verbal Orders >> Apr 14, 2024  4:27 PM Joesph NOVAK wrote: Caller/Agency: [facility]  Callback Number: [phone] Service Requested: Physical Therapy Frequency: 1 week2, 2week2, 1 week4  Any new concerns about the patient? No
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
## 2024-04-18 NOTE — Telephone Encounter (Signed)
 Patients daughter wants to know if you could do a house visit for his upcoming visit. Patient is currently at [facility] Emergency Department for Abdominal pain and Loss of Conscious, not sure when he will be discharged but patients daughter will call back tomorrow evening if patient is still in the hospital.

## 2024-04-19 ENCOUNTER — Other Ambulatory Visit: Payer: Self-pay

## 2024-04-19 ENCOUNTER — Inpatient Hospital Stay (HOSPITAL_COMMUNITY)

## 2024-04-19 ENCOUNTER — Encounter (HOSPITAL_COMMUNITY): Payer: Self-pay | Admitting: Internal Medicine

## 2024-04-19 ENCOUNTER — Telehealth: Payer: Self-pay | Admitting: Family Medicine

## 2024-04-19 ENCOUNTER — Other Ambulatory Visit: Payer: Self-pay | Admitting: Internal Medicine

## 2024-04-19 ENCOUNTER — Other Ambulatory Visit: Payer: Self-pay | Admitting: Family Medicine

## 2024-04-19 DIAGNOSIS — R55 Syncope and collapse: Secondary | ICD-10-CM | POA: Diagnosis not present

## 2024-04-19 DIAGNOSIS — G4700 Insomnia, unspecified: Secondary | ICD-10-CM

## 2024-04-19 LAB — PHOSPHORUS: Phosphorus: 2 mg/dL — ABNORMAL LOW (ref 2.5–4.6)

## 2024-04-19 LAB — ECHOCARDIOGRAM LIMITED
AV Peak grad: 8.2 mmHg
Ao pk vel: 1.43 m/s
Area-P 1/2: 4.29 cm2
Calc EF: 24.9 %
Est EF: <20
Height: 71 in
P 1/2 time: 648 ms
S' Lateral: 5.2 cm
Single Plane A2C EF: 28 %
Single Plane A4C EF: 19.7 %
Weight: 2123.47 [oz_av]

## 2024-04-19 LAB — CBC
HCT: 30.9 % — ABNORMAL LOW (ref 39.0–52.0)
Hemoglobin: 9.1 g/dL — ABNORMAL LOW (ref 13.0–17.0)
MCH: 31.1 pg (ref 26.0–34.0)
MCHC: 29.4 g/dL — ABNORMAL LOW (ref 30.0–36.0)
MCV: 105.5 fL — ABNORMAL HIGH (ref 80.0–100.0)
Platelets: 146 10*3/uL — ABNORMAL LOW (ref 150–400)
RBC: 2.93 MIL/uL — ABNORMAL LOW (ref 4.22–5.81)
RDW: 14.1 % (ref 11.5–15.5)
WBC: 6.3 10*3/uL (ref 4.0–10.5)
nRBC: 0 % (ref 0.0–0.2)

## 2024-04-19 LAB — BODY FLUID CELL COUNT WITH DIFFERENTIAL
Eos, Fluid: 0 %
Lymphs, Fluid: 85 %
Monocyte-Macrophage-Serous Fluid: 9 % — ABNORMAL LOW (ref 50–90)
Neutrophil Count, Fluid: 6 % (ref 0–25)
Total Nucleated Cell Count, Fluid: 1629 uL — ABNORMAL HIGH (ref 0–1000)

## 2024-04-19 LAB — PROCALCITONIN: Procalcitonin: 0.39 ng/mL

## 2024-04-19 LAB — LACTATE DEHYDROGENASE: LDH: 231 U/L (ref 105–235)

## 2024-04-19 LAB — BASIC METABOLIC PANEL WITH GFR
Anion gap: 13 (ref 5–15)
BUN: 13 mg/dL (ref 8–23)
CO2: 22 mmol/L (ref 22–32)
Calcium: 8.6 mg/dL — ABNORMAL LOW (ref 8.9–10.3)
Chloride: 105 mmol/L (ref 98–111)
Creatinine, Ser: 1.12 mg/dL (ref 0.61–1.24)
GFR, Estimated: >60 mL/min
Glucose, Bld: 82 mg/dL (ref 70–99)
Potassium: 3.9 mmol/L (ref 3.5–5.1)
Sodium: 140 mmol/L (ref 135–145)

## 2024-04-19 LAB — MAGNESIUM: Magnesium: 2.4 mg/dL (ref 1.7–2.4)

## 2024-04-19 LAB — PROTEIN, TOTAL: Total Protein: 6.9 g/dL (ref 6.5–8.1)

## 2024-04-19 LAB — ALBUMIN: Albumin: 2.8 g/dL — ABNORMAL LOW (ref 3.5–5.0)

## 2024-04-19 MED ORDER — MIDODRINE HCL 5 MG PO TABS
10.0000 mg | ORAL_TABLET | Freq: Three times a day (TID) | ORAL | Status: AC
  Administered 2024-04-19 – 2024-04-21 (×9): 10 mg via ORAL
  Filled 2024-04-19 (×9): qty 2

## 2024-04-19 MED ORDER — ENSURE PLUS HIGH PROTEIN PO LIQD
237.0000 mL | Freq: Two times a day (BID) | ORAL | Status: AC
  Administered 2024-04-20 – 2024-04-21 (×3): 237 mL via ORAL

## 2024-04-19 MED ORDER — CHLORHEXIDINE GLUCONATE CLOTH 2 % EX PADS
6.0000 | MEDICATED_PAD | Freq: Every day | CUTANEOUS | Status: AC
  Administered 2024-04-19 – 2024-04-21 (×3): 6 via TOPICAL

## 2024-04-19 MED ORDER — PERFLUTREN LIPID MICROSPHERE
1.0000 mL | INTRAVENOUS | Status: AC | PRN
  Administered 2024-04-19: 2 mL via INTRAVENOUS

## 2024-04-19 MED ORDER — ROSUVASTATIN CALCIUM 20 MG PO TABS
20.0000 mg | ORAL_TABLET | Freq: Every day | ORAL | Status: AC
  Administered 2024-04-20 – 2024-04-21 (×2): 20 mg via ORAL
  Filled 2024-04-19 (×4): qty 1

## 2024-04-19 MED ORDER — LIDOCAINE HCL (PF) 2 % IJ SOLN
10.0000 mL | Freq: Once | INTRAMUSCULAR | Status: AC
  Administered 2024-04-19: 10 mL via INTRADERMAL
  Filled 2024-04-19: qty 10

## 2024-04-19 MED ORDER — MIRTAZAPINE 15 MG PO TABS
7.5000 mg | ORAL_TABLET | Freq: Every day | ORAL | Status: AC
  Administered 2024-04-19 – 2024-04-21 (×3): 7.5 mg via ORAL
  Filled 2024-04-19 (×3): qty 1

## 2024-04-19 MED ORDER — LIDOCAINE HCL (PF) 2 % IJ SOLN
INTRAMUSCULAR | Status: AC
  Filled 2024-04-19: qty 10

## 2024-04-19 MED ORDER — ASPIRIN 81 MG PO CHEW
81.0000 mg | CHEWABLE_TABLET | Freq: Every day | ORAL | Status: AC
  Administered 2024-04-19 – 2024-04-21 (×3): 81 mg via ORAL
  Filled 2024-04-19 (×3): qty 1

## 2024-04-19 MED ORDER — DAPAGLIFLOZIN PROPANEDIOL 5 MG PO TABS
5.0000 mg | ORAL_TABLET | Freq: Every day | ORAL | Status: AC
  Administered 2024-04-20 – 2024-04-21 (×2): 5 mg via ORAL
  Filled 2024-04-19 (×4): qty 1

## 2024-04-19 NOTE — Progress Notes (Signed)
 Pt has a ultrasound guided thoracentesis. 950 ml of serosanguinous color fluid. VSS. No acute distress. Pt sent to chest xray and transported back to room by transporter

## 2024-04-19 NOTE — Evaluation (Signed)
 Occupational Therapy Evaluation Patient Details Name: [name] MRN: [id number] DOB: 1926/03/16 Today's Date: 04/19/2024   History of Present Illness   TEQUAN REDMON is a 89 y.o. male with medical history significant for hypertension, asthma, GERD, chronic HFrEF 20-25%, chronic hypotension on midodrine  prior to admission, who presents to the ER via EMS from home due to reported syncopal episode.  The patient is unable to provide a history due to confusion.  No family members at bedside.  Reportedly, the patient was moved into a room where he stayed for about 20 minutes with propane heater that began having a leak.  He had some confusion noted by family members at home.  Associated with nausea without vomiting.  No reported chest pain or palpitations.  It is unclear how he lost consciousness.  EMS was activated.  The patient was placed on O2 supplementation 4 L via EMS.  He presented to the ER for further evaluation. (per DO)     Clinical Impressions Pt agreeable to OT and PT co-evaluation. Pt alert and able to follow commands well. Pt required supervision for bed mobility and CGA to min A for functional transfers with RW. Pt demonstrates level of set up to CGA for seated ADL's. Pt left in the chair with call bell within reach and chair alarm in place. Pt will benefit from continued OT in the hospital to increase strength, balance, and endurance for safe ADL's.        If plan is discharge home, recommend the following:   A little help with walking and/or transfers;A little help with bathing/dressing/bathroom;Assistance with cooking/housework;Assist for transportation;Help with stairs or ramp for entrance     Functional Status Assessment   Patient has had a recent decline in their functional status and demonstrates the ability to make significant improvements in function in a reasonable and predictable amount of time.     Equipment Recommendations   None recommended by  OT             Precautions/Restrictions   Precautions Precautions: Fall Recall of Precautions/Restrictions: Intact Restrictions Weight Bearing Restrictions Per Provider Order: No     Mobility Bed Mobility Overal bed mobility: Needs Assistance Bed Mobility: Supine to Sit     Supine to sit: Supervision     General bed mobility comments: Increased time.    Transfers Overall transfer level: Needs assistance Equipment used: Rolling walker (2 wheels) Transfers: Sit to/from Stand, Bed to chair/wheelchair/BSC Sit to Stand: Contact guard assist     Step pivot transfers: Contact guard assist, Min assist     General transfer comment: EOB to chair with RW.      Balance Overall balance assessment: Needs assistance Sitting-balance support: No upper extremity supported, Feet supported Sitting balance-Leahy Scale: Good Sitting balance - Comments: seated at EOB   Standing balance support: Bilateral upper extremity supported, During functional activity, Reliant on assistive device for balance Standing balance-Leahy Scale: Fair Standing balance comment: using RW                           ADL either performed or assessed with clinical judgement   ADL Overall ADL's : Needs assistance/impaired     Grooming: Set up;Sitting       Lower Body Bathing: Set up;Contact guard assist;Sitting/lateral leans       Lower Body Dressing: Set up;Contact guard assist;Sitting/lateral leans   Toilet Transfer: Contact guard assist;Minimal assistance;Rolling walker (2 wheels);Ambulation;Stand-pivot Statistician Details (indicate  cue type and reason): Via ambulation in the room with RW and transfer to chair. Toileting- Clothing Manipulation and Hygiene: Set up;Contact guard assist;Sitting/lateral lean;Sit to/from stand       Functional mobility during ADLs: Contact guard assist;Minimal assistance;Rolling walker (2 wheels)       Vision Baseline Vision/History: 1 Wears  glasses Ability to See in Adequate Light: 1 Impaired Patient Visual Report: No change from baseline Vision Assessment?: No apparent visual deficits Additional Comments: Some discharged from L eye noted.     Perception Perception: Not tested       Praxis Praxis: Not tested       Pertinent Vitals/Pain Pain Assessment Pain Assessment: No/denies pain     Extremity/Trunk Assessment Upper Extremity Assessment Upper Extremity Assessment: Generalized weakness   Lower Extremity Assessment Lower Extremity Assessment: Defer to PT evaluation   Cervical / Trunk Assessment Cervical / Trunk Assessment: Kyphotic   Communication Communication Communication: Impaired Factors Affecting Communication: Hearing impaired   Cognition Arousal: Alert Behavior During Therapy: WFL for tasks assessed/performed Cognition: No apparent impairments                               Following commands: Intact       Cueing  General Comments   Cueing Techniques: Verbal cues;Tactile cues                 Home Living Family/patient expects to be discharged to:: Private residence Living Arrangements: Children Available Help at Discharge: Family;Available PRN/intermittently Type of Home: House Home Access: Level entry     Home Layout: One level     Bathroom Shower/Tub: Producer, Television/film/video: Standard Bathroom Accessibility: Yes How Accessible: Accessible via walker Home Equipment: Rolling Walker (2 wheels);BSC/3in1;Shower seat;Grab bars - tub/shower   Additional Comments: Pt reports his family can stay with him for a few days.      Prior Functioning/Environment Prior Level of Function : Independent/Modified Independent             Mobility Comments: Household ambulation using RW ADLs Comments: Pt reports independent ADL's with assist for IADL's.    OT Problem List: Decreased strength;Decreased activity tolerance;Impaired balance (sitting and/or  standing)   OT Treatment/Interventions: Self-care/ADL training;Therapeutic exercise;DME and/or AE instruction;Therapeutic activities;Patient/family education;Balance training      OT Goals(Current goals can be found in the care plan section)   Acute Rehab OT Goals Patient Stated Goal: Return home. OT Goal Formulation: With patient Time For Goal Achievement: 05/03/24 Potential to Achieve Goals: Good   OT Frequency:  Min 1X/week    Co-evaluation PT/OT/SLP Co-Evaluation/Treatment: Yes Reason for Co-Treatment: To address functional/ADL transfers   OT goals addressed during session: ADL's and self-care                       End of Session Equipment Utilized During Treatment: Rolling walker (2 wheels);Gait belt  Activity Tolerance: Patient tolerated treatment well Patient left: in chair;with call bell/phone within reach;with chair alarm set  OT Visit Diagnosis: Unsteadiness on feet (R26.81);Other abnormalities of gait and mobility (R26.89);Muscle weakness (generalized) (M62.81)                Time: 8997-8981 OT Time Calculation (min): 16 min Charges:  OT General Charges $OT Visit: 1 Visit OT Evaluation $OT Eval Low Complexity: 1 Low  Safir Michalec OT, MOT   Jayson Person 04/19/2024, 12:15 PM

## 2024-04-19 NOTE — Progress Notes (Addendum)
 " PROGRESS NOTE    [name]  FMW:[id number] DOB: 1926-02-03 DOA: 04/18/2024 PCP: Alphonsa Glendia LABOR, MD  Subjective: No acute events overnight. Seen and examined at bedside. Reports no new complaints. Unable to provide much history due to altered mentation. Able to answer some simple questions and follow commands. Denies nausea, vomiting, constipation.   Hospital Course:  89 y.o. male with medical history significant for hypertension, asthma, GERD, chronic HFrEF 20-25%, chronic hypotension on midodrine  prior to admission, who presents to the ER via EMS from home due to reported syncopal episode.  The patient is unable to provide a history due to confusion.  No family members at bedside.  Reportedly, the patient was moved into a room where he stayed for about 20 minutes with propane heater that began having a leak.  He had some confusion noted by family members at home.  Associated with nausea without vomiting.  No reported chest pain or palpitations.  It is unclear how he lost consciousness.  EMS was activated.  The patient was placed on O2 supplementation 4L via EMS.  He presented to the ER for further evaluation.   Admitted for acute encephalopathy, possible complicated PNA with recurrent L pleural effusion, and generalized weakness.   Assessment and Plan:  Possible necrotizing pneumonia  Possible mass-like consolidation in the lingula Pro-calcitonin not elevated Bcx with NGTD, final read pending Repeat CXR showed residual bilateral effusions and bibasilar atelectasis/consolidation Currently on empiric cefepime  and IV linezolid  until infection is ruled out Follow up with on pleural fluid cultures Monitor clinically for respiratory symptoms   Moderate to large loculated left pleural effusion Complete compressive atelectasis of the left lower lobe Status post IR diagnostic thoracentesis with removed. Follow up pleural fluid studies  Chronic HFrEF 20-25% Hx of  Hypotension TTE showed LVEF < 20%, severely decreased LV function, global hypokinesis, moderately reduced RV systolic function, moderately elevated pulmonary artery systolic pressure, normal IVC Likely fluid overloaded with recurrent pleural effusion GDMT limited due to hx of severe hypotension-related shock, CKD Cont home midodrine  Hold home lasix  and spironolactone  Monitor strict I's and O's and daily weight Will likely need further goals of care given poor prognosis   Elevated troponin, suspect demand ischemia in the setting of the above Denies chest pain High-sensitivity troponin 51, 51 No evidence of acute ischemia on 12-lead EKG TTE showed LVEF < 20%, severely decreased LV function, global hypokinesis, moderately reduced RV systolic function, moderately elevated pulmonary artery systolic pressure, normal IVC Continue to monitor on telemetry   Reported syncope, unclear circumstances The patient is alert and oriented x 2.  No family members at bedside. Follow orthostatic vital signs. TTE results as elsewhere, unchanged from prior Fall precautions.   Chronic hypotension Cont home midodrine  Hold home lasix  and spironolactone   Generalized weakness PT OT following Fall precautions.  DVT prophylaxis: enoxaparin  (LOVENOX ) injection 40 mg Start: 04/18/24 2145  Lovenox    Code Status: Limited: Do not attempt resuscitation (DNR) -DNR-LIMITED -Do Not Intubate/DNI   Disposition Plan: Home with HHPT Reason for continuing need for hospitalization: IV antibiotics, monitor for clinical improvement  Objective: Vitals:   04/19/24 0630 04/19/24 1319 04/19/24 1340 04/19/24 1428  BP: 105/71 (!) 96/46 93/62 91/62   Pulse: 85   88  Resp: 20 18 17    Temp: 97.6 F (36.4 C) 98 F (36.7 C)  (!) 97.4 F (36.3 C)  TempSrc: Oral   Axillary  SpO2: 97% 91% 92% 94%  Weight: 60.2 kg     Height:  Intake/Output Summary (Last 24 hours) at 04/19/2024 1614 Last data filed at 04/19/2024  0846 Gross per 24 hour  Intake 1140 ml  Output 750 ml  Net 390 ml   Filed Weights   04/18/24 1355 04/18/24 2158 04/19/24 0630  Weight: 60 kg 61.4 kg 60.2 kg    Examination:  Physical Exam Vitals and nursing note reviewed.  Constitutional:      General: He is not in acute distress.    Appearance: He is ill-appearing.  HENT:     Head: Normocephalic and atraumatic.  Cardiovascular:     Rate and Rhythm: Normal rate and regular rhythm.     Pulses: Normal pulses.     Heart sounds: Normal heart sounds.  Pulmonary:     Effort: Pulmonary effort is normal. No respiratory distress.     Breath sounds: Normal breath sounds. No wheezing.  Abdominal:     General: Bowel sounds are normal. There is no distension.     Palpations: Abdomen is soft.     Tenderness: There is no abdominal tenderness.  Neurological:     Mental Status: He is alert.     Data Reviewed: I have personally reviewed following labs and imaging studies  CBC: Recent Labs  Lab 04/18/24 1423 04/18/24 1426 04/19/24 0702  WBC 8.6  --  6.3  NEUTROABS 7.6  --   --   HGB 10.8* 11.9* 9.1*  HCT 36.5* 35.0* 30.9*  MCV 104.0*  --  105.5*  PLT 169  --  146*   Basic Metabolic Panel: Recent Labs  Lab 04/18/24 1423 04/18/24 1426 04/19/24 0702  NA 141 141 140  K 4.0 3.9 3.9  CL 101 100 105  CO2 26  --  22  GLUCOSE 98 101* 82  BUN 13 13 13   CREATININE 1.20 1.20 1.12  CALCIUM  9.3  --  8.6*  MG  --   --  2.4  PHOS  --   --  2.0*   GFR: Estimated Creatinine Clearance: 32.8 mL/min (by C-G formula based on SCr of 1.12 mg/dL). Liver Function Tests: Recent Labs  Lab 04/18/24 1423  AST 26  ALT 9  ALKPHOS 84  BILITOT 1.3*  PROT 7.5  ALBUMIN 3.3*   No results for input(s): LIPASE, AMYLASE in the last 168 hours. No results for input(s): AMMONIA in the last 168 hours. Coagulation Profile: No results for input(s): INR, PROTIME in the last 168 hours. Cardiac Enzymes: No results for input(s): CKTOTAL,  CKMB, CKMBINDEX, TROPONINI in the last 168 hours. ProBNP, BNP (last 5 results) Recent Labs    10/12/23 1305 01/19/24 1230 03/30/24 1802  PROBNP  --  10,382.0* 6,350.0*  BNP 299.0*  --   --    HbA1C: No results for input(s): HGBA1C in the last 72 hours. CBG: No results for input(s): GLUCAP in the last 168 hours. Lipid Profile: No results for input(s): CHOL, HDL, LDLCALC, TRIG, CHOLHDL, LDLDIRECT in the last 72 hours. Thyroid  Function Tests: No results for input(s): TSH, T4TOTAL, FREET4, T3FREE, THYROIDAB in the last 72 hours. Anemia Panel: No results for input(s): VITAMINB12, FOLATE, FERRITIN, TIBC, IRON, RETICCTPCT in the last 72 hours. Sepsis Labs: Recent Labs  Lab 04/18/24 1423 04/18/24 1720 04/19/24 0702  PROCALCITON  --   --  0.39  LATICACIDVEN 3.0* 1.9  --     Recent Results (from the past 240 hours)  Blood culture (routine x 2)     Status: None (Preliminary result)   Collection Time: 04/18/24  2:23 PM  Specimen: BLOOD  Result Value Ref Range Status   Specimen Description BLOOD BOTTLES DRAWN AEROBIC ONLY  Final   Special Requests BLOOD RIGHT WRIST Blood Culture adequate volume  Final   Culture   Final    NO GROWTH < 24 HOURS Performed at [facility], [address] [place], [place] [address]    Report Status PENDING  Incomplete  Blood culture (routine x 2)     Status: None (Preliminary result)   Collection Time: 04/18/24  6:05 PM   Specimen: BLOOD  Result Value Ref Range Status   Specimen Description BLOOD LEFT ANTECUBITAL  Final   Special Requests   Final    BOTTLES DRAWN AEROBIC ONLY Blood Culture adequate volume   Culture   Final    NO GROWTH < 24 HOURS Performed at [facility], [address] [place], [place] [address]    Report Status PENDING  Incomplete     Radiology Studies: ECHOCARDIOGRAM LIMITED Result Date: 04/19/2024    ECHOCARDIOGRAM LIMITED REPORT   Patient Name:   [name] Date of  Exam: 04/19/2024 Medical Rec #:  [id number]           Height:       71.0 in Accession #:    [id number]          Weight:       132.7 lb Date of Birth:  28-May-1925           BSA:          1.771 m Patient Age:    89 years            BP:           105/71 mmHg Patient Gender: M                   HR:           54 bpm. Exam Location:  [facility] Procedure: Limited Echo, Limited Color Doppler and Intracardiac Opacification            Agent (Both Spectral and Color Flow Doppler were utilized during            procedure). Indications:    Syncope R55  History:        Patient has prior history of Echocardiogram examinations, most                 recent 01/18/2024. Signs/Symptoms:Murmur; Risk                 Factors:Hypertension.  Sonographer:    Sydnee Wilson RDCS Referring Phys: [id number] CAROLE N HALL IMPRESSIONS  1. No LV thrombus by Definity . Left ventricular ejection fraction, by estimation, is <20%. The left ventricle has severely decreased function. The left ventricle demonstrates global hypokinesis. The left ventricular internal cavity size was mildly dilated. Left ventricular diastolic parameters are consistent with Grade I diastolic dysfunction (impaired relaxation). Elevated left atrial pressure.  2. Right ventricular systolic function is moderately reduced. The right ventricular size is mildly enlarged. There is moderately elevated pulmonary artery systolic pressure. The estimated right ventricular systolic pressure is 46.4 mmHg.  3. The mitral valve is normal in structure. Mild to moderate mitral valve regurgitation. No evidence of mitral stenosis.  4. The aortic valve is tricuspid. Aortic valve regurgitation is mild. No aortic stenosis is present. Aortic regurgitation PHT measures 648 msec.  5. The inferior vena cava is normal in size with <50% respiratory variability, suggesting right atrial pressure of 8  mmHg. Comparison(s): No significant change from prior study. FINDINGS  Left Ventricle: No LV thrombus by Definity .  Left ventricular ejection fraction, by estimation, is <20%. The left ventricle has severely decreased function. The left ventricle demonstrates global hypokinesis. Definity  contrast agent was given IV to delineate the left ventricular endocardial borders. The left ventricular internal cavity size was mildly dilated. There is no left ventricular hypertrophy. Left ventricular diastolic parameters are consistent with Grade I diastolic dysfunction (impaired relaxation). Elevated left atrial pressure. Right Ventricle: The right ventricular size is mildly enlarged. No increase in right ventricular wall thickness. Right ventricular systolic function is moderately reduced. There is moderately elevated pulmonary artery systolic pressure. The tricuspid regurgitant velocity is 3.10 m/s, and with an assumed right atrial pressure of 8 mmHg, the estimated right ventricular systolic pressure is 46.4 mmHg. Left Atrium: Left atrial size was normal in size. Right Atrium: Right atrial size was normal in size. Pericardium: There is no evidence of pericardial effusion. Mitral Valve: The mitral valve is normal in structure. Mild to moderate mitral valve regurgitation. No evidence of mitral valve stenosis. Tricuspid Valve: The tricuspid valve is normal in structure. Tricuspid valve regurgitation is mild . No evidence of tricuspid stenosis. Aortic Valve: The aortic valve is tricuspid. Aortic valve regurgitation is mild. Aortic regurgitation PHT measures 648 msec. No aortic stenosis is present. Aortic valve peak gradient measures 8.2 mmHg. Pulmonic Valve: The pulmonic valve was not well visualized. Pulmonic valve regurgitation is not visualized. No evidence of pulmonic stenosis. Aorta: The aortic root is normal in size and structure. Venous: The inferior vena cava is normal in size with less than 50% respiratory variability, suggesting right atrial pressure of 8 mmHg. IAS/Shunts: No atrial level shunt detected by color flow Doppler. LEFT  VENTRICLE PLAX 2D LVIDd:         5.40 cm      Diastology LVIDs:         5.20 cm      LV e' medial:    4.13 cm/s LV PW:         0.90 cm      LV E/e' medial:  25.2 LV IVS:        1.10 cm      LV e' lateral:   4.90 cm/s                             LV E/e' lateral: 21.2  LV Volumes (MOD) LV vol d, MOD A2C: 214.0 ml LV vol d, MOD A4C: 228.0 ml LV vol s, MOD A2C: 154.0 ml LV vol s, MOD A4C: 183.0 ml LV SV MOD A2C:     60.0 ml LV SV MOD A4C:     228.0 ml LV SV MOD BP:      55.9 ml RIGHT VENTRICLE RV S prime:     5.11 cm/s TAPSE (M-mode): 1.4 cm AORTIC VALVE AV Vmax:      143.00 cm/s AV Peak Grad: 8.2 mmHg AI PHT:       648 msec MITRAL VALVE                TRICUSPID VALVE MV Area (PHT): 4.29 cm     TR Peak grad:   38.4 mmHg MV Decel Time: 177 msec     TR Vmax:        310.00 cm/s MV E velocity: 104.00 cm/s MV A velocity: 106.00 cm/s MV E/A ratio:  0.98 Vishnu Priya Mallipeddi  Electronically signed by Diannah Late Mallipeddi Signature Date/Time: 04/19/2024/3:05:32 PM    Final    DG Chest Portable 1 View Result Date: 04/19/2024 CLINICAL DATA:  Status post left thoracentesis, pleural effusion EXAM: PORTABLE CHEST 1 VIEW COMPARISON:  04/18/2024 FINDINGS: Interval improvement in the left effusion following thoracentesis. Suspect some degree of incomplete left lower lung re-expansion. No large pneumothorax. Residual effusions remain bilaterally. Collapse/consolidation in lingula and left lower lobe persist. Minor right base atelectasis. Degenerative changes and osteopenia of the bones. Aorta atherosclerotic. Trachea midline. IMPRESSION: 1. Improved left effusion following thoracentesis. No large pneumothorax. 2. Residual bilateral effusions and bibasilar atelectasis/consolidation. Electronically Signed   By: CHRISTELLA.  Shick M.D.   On: 04/19/2024 13:59   US  THORACENTESIS LEFT ASP PLEURAL SPACE W/IMG GUIDE Result Date: 04/19/2024 INDICATION: 89 year old male. History of CHF. Found to have left-sided pleural effusion. Request is for  therapeutic and diagnostic left-sided thoracentesis. EXAM: ULTRASOUND GUIDED THERAPEUTIC LEFT-SIDED THORACENTESIS MEDICATIONS: Lidocaine  1% 10 mL COMPLICATIONS: None immediate. PROCEDURE: An ultrasound guided thoracentesis was thoroughly discussed with the patient and questions answered. The benefits, risks, alternatives and complications were also discussed. The patient understands and wishes to proceed with the procedure. Written consent was obtained. Ultrasound was performed to localize and mark an adequate pocket of fluid in the left chest. The area was then prepped and draped in the normal sterile fashion. 1% Lidocaine  was used for local anesthesia. Under ultrasound guidance a 6 Fr Safe-T-Centesis catheter was introduced. Thoracentesis was performed. The catheter was removed and a dressing applied. FINDINGS: A total of approximately 950 mL of serosanguineous fluid was removed. Samples were sent to the laboratory as requested by the clinical team. IMPRESSION: Successful ultrasound guided therapeutic and diagnostic left-sided thoracentesis yielding 950 mL of serosanguineous pleural fluid. Performed by Delon Beagle NP Electronically Signed   By: CHRISTELLA.  Shick M.D.   On: 04/19/2024 13:56   CT CHEST ABDOMEN PELVIS W CONTRAST Result Date: 04/18/2024 EXAM: CT CHEST, ABDOMEN AND PELVIS WITH CONTRAST 04/18/2024 04:46:33 PM TECHNIQUE: CT of the chest, abdomen and pelvis was performed with the administration of 100 mL of iohexol  (OMNIPAQUE ) 300 milligrams per milliliter solution. Multiplanar reformatted images are provided for review. Automated exposure control, iterative reconstruction, and/or weight based adjustment of the milliamperes/kilovolts was utilized to reduce the radiation dose to as low as reasonably achievable. COMPARISON: 04/18/2024, 12/02/2023, and 05/22/2019. CLINICAL HISTORY: Epigastric pain. FINDINGS: CHEST: MEDIASTINUM AND LYMPH NODES: Moderate cardiomegaly. Multivessel coronary atherosclerosis.  Scattered aortic atherosclerosis. Rightward shift of the cardiomediastinal structures. Intraluminal nodularity within the upper esophagus at the thoracic inlet, measuring 1 cm (axial 9 and 10). Moderate amount of layering fluid in the mid and distal esophagus. The central airways are clear. No mediastinal, hilar or axillary lymphadenopathy. LUNGS AND PLEURA: While not optimized for the evaluation of the pulmonary arteries, no pulmonary embolism visualized. Moderate to large left pleural effusion, which is loculated. Near complete compressive atelectasis of the left lower lobe. Small right pleural effusion with right basilar atelectasis. Mild centrilobular emphysema throughout the lungs. Hypodense masslike consolidation in the lingula centrally, abutting the mediastinum, measuring 6.2 x 4.2 x 5.4 cm (axial 40/60). No pneumothorax. ABDOMEN AND PELVIS: LIVER: Subcentimeter hypodensity in the lateral right hepatic lobe, too small to definitively characterize, but likely benign given the stability. GALLBLADDER AND BILE DUCTS: Unremarkable. No biliary ductal dilatation. SPLEEN: No acute abnormality. PANCREAS: No acute abnormality. ADRENAL GLANDS: No acute abnormality. KIDNEYS, URETERS AND BLADDER: Mild renal cortical atrophy of both kidneys. Small right lower pole cyst, unchanged.  Per consensus, no follow-up is needed for simple Bosniak type 1 and 2 renal cysts, unless the patient has a malignancy history or risk factors. Subchondral aortic left renal vein, a normal variant. The urinary bladder is completely decompressed with a urinary catheter in place. No stones in the kidneys or ureters. No hydronephrosis. No perinephric or periureteral stranding. GI AND BOWEL: Stomach is completely decompressed. Total colonic diverticulosis. No changes of acute diverticulitis. Appendectomy. There is no bowel obstruction. REPRODUCTIVE ORGANS: Mild prostatomegaly. PERITONEUM AND RETROPERITONEUM: No ascites. No free air. VASCULATURE:  Diffuse aortoiliac atherosclerosis. Unchanged fusiform aneurysm of the infrarenal aorta measuring 3 x 3.2 cm. ABDOMINAL AND PELVIS LYMPH NODES: No lymphadenopathy. BONES AND SOFT TISSUES: Diffuse osteopenia. Advanced multilevel degenerative disc disease of the spine. Mild grade 1 anterolisthesis of L3 on L4. Mild osteoarthritis of both hips. Diffuse anasarca. No focal soft tissue abnormality. IMPRESSION: 1. Hypodense masslike consolidation in the lingula abutting the mediastinum measuring 6.2 x 4.2 x 5.4 cm. This could represent either a region of necrotizing pneumonia or consolidation due to an underlying neoplasm. Moderate to large loculated left pleural effusion with near complete compressive atelectasis of the left lower lobe. 2. Intraluminal nodularity within the upper esophagus at the thoracic inlet measuring 1 cm. While this could represent ingested material, a small polyp or developing neoplasm could have this appearance in the correct clinical context. Nonemergent upper endoscopy may be of benefit for further characterization. 3. Small right pleural effusion. Diffuse anasarca. This may be related to the patient's volume status. 4. Mild prostatomegaly. Electronically signed by: Rogelia Myers MD 04/18/2024 05:22 PM EST RP Workstation: [id number]   CT Head Wo Contrast Result Date: 04/18/2024 EXAM: CT HEAD WITHOUT CONTRAST 04/18/2024 04:46:33 PM TECHNIQUE: CT of the head was performed without the administration of intravenous contrast. Automated exposure control, iterative reconstruction, and/or weight based adjustment of the mA/kV was utilized to reduce the radiation dose to as low as reasonably achievable. COMPARISON: CT head 11/15/2019 and MRI brain 11/09/2019. CLINICAL HISTORY: Syncope/presyncope, cerebrovascular cause suspected. FINDINGS: BRAIN AND VENTRICLES: No acute hemorrhage. No evidence of acute territorial infarct. No hydrocephalus. No extra-axial collection. No mass effect or midline shift.  There is overall similar moderate scattered white matter hypodensities which are nonspecific but most commonly represent chronic microvascular ischemic changes. ORBITS: No acute abnormality. SINUSES: No acute abnormality. SOFT TISSUES AND SKULL: No acute soft tissue abnormality. No calvarial fracture. IMPRESSION: 1. No acute intracranial hemorrhage or calvarial fracture. Electronically signed by: Prentice Spade MD 04/18/2024 05:02 PM EST RP Workstation: [id number]   DG Chest Port 1 View Result Date: 04/18/2024 CLINICAL DATA:  Weakness EXAM: PORTABLE CHEST 1 VIEW COMPARISON:  March 31, 2024 FINDINGS: Stable cardiomegaly. Stable moderate size loculated left pleural effusion with associated atelectasis. Minimal right pleural effusion is noted with minimal right basilar subsegmental atelectasis. Bony thorax is unremarkable. IMPRESSION: Stable moderate size loculated left pleural effusion with associated atelectasis. Minimal right pleural effusion is noted with minimal right basilar subsegmental atelectasis. Electronically Signed   By: Lynwood Landy Raddle M.D.   On: 04/18/2024 14:34    Scheduled Meds:  Chlorhexidine  Gluconate Cloth  6 each Topical Daily   enoxaparin  (LOVENOX ) injection  40 mg Subcutaneous Q24H   midodrine   10 mg Oral TID WC   Continuous Infusions:  ceFEPime  (MAXIPIME ) IV 2 g (04/19/24 1133)   linezolid  (ZYVOX ) IV 600 mg (04/19/24 1017)     LOS: 1 day   Norval Bar, MD  [facility]  04/19/2024, 4:14 PM   "

## 2024-04-19 NOTE — Telephone Encounter (Signed)
 Noted.

## 2024-04-19 NOTE — Procedures (Addendum)
 Ultrasound-guided diagnostic and therapeutic left sided thoracentesis performed yielding 950 liters of serosangious colored fluid. No immediate complications.   Diagnostic fluid was sent to the lab for further analysis. Follow-up chest x-ray pending. EBL is < 2 ml.

## 2024-04-19 NOTE — Telephone Encounter (Signed)
 SABRA

## 2024-04-19 NOTE — Progress Notes (Signed)
" °  Transition of Care [facility]) Screening Note   Patient Details  Name: [name] Date of Birth: 1926/01/01   Transition of Care [facility]) CM/SW Contact:    Hoy DELENA Bigness, LCSW Phone Number: 04/19/2024, 2:59 PM    Transition of Care Department [facility]) has reviewed patient and no TOC needs have been identified at this time. We will continue to monitor patient advancement through interdisciplinary progression rounds. If new patient transition needs arise, please place a TOC consult.     04/19/24 1458  TOC Brief Assessment  Insurance and Status Reviewed  Patient has primary care physician Yes  Home environment has been reviewed From home w/ family  Prior level of function: Needs assist  Prior/Current Home Services Current home services (HHPT/RN w/ [facility])  Social Drivers of Health Review SDOH reviewed no interventions necessary  Readmission risk has been reviewed Yes  Transition of care needs no transition of care needs at this time    "

## 2024-04-19 NOTE — Plan of Care (Signed)

## 2024-04-19 NOTE — Evaluation (Signed)
 Physical Therapy Evaluation Patient Details Name: [name] MRN: [id number] DOB: 1925-04-16 Today's Date: 04/19/2024  History of Present Illness  [name] is a 89 y.o. male with medical history significant for hypertension, asthma, GERD, chronic HFrEF 20-25%, chronic hypotension on midodrine  prior to admission, who presents to the ER via EMS from home due to reported syncopal episode.  The patient is unable to provide a history due to confusion.  No family members at bedside.  Reportedly, the patient was moved into a room where he stayed for about 20 minutes with propane heater that began having a leak.  He had some confusion noted by family members at home.  Associated with nausea without vomiting.  No reported chest pain or palpitations.  It is unclear how he lost consciousness.  EMS was activated.  The patient was placed on O2 supplementation 4 L via EMS.  He presented to the ER for further evaluation.   Clinical Impression  Patient was agreeable to PT/OT co-evaluation. Patient reports at baseline, he can ambulate with RW and is independent with ADLs. This date, pt is mod independent/supervision with bed mobility but   but requires min/CGA for transfers and ambulation. Patient limited by general weakness, fatigue, and balance deficits. He tolerates sitting up in chair at end of session, alarm set and nursing staff in room. Patient reports having family available to stay with him upon discharge from hospital. Patient will benefit from continued skilled physical therapy in order to current deficits to improve overall function.        If plan is discharge home, recommend the following: A lot of help with bathing/dressing/bathroom;A lot of help with walking and/or transfers;Help with stairs or ramp for entrance;Assist for transportation;Assistance with cooking/housework   Can travel by private vehicle        Equipment Recommendations None recommended by PT  Recommendations for  Other Services       Functional Status Assessment Patient has had a recent decline in their functional status and demonstrates the ability to make significant improvements in function in a reasonable and predictable amount of time.     Precautions / Restrictions Precautions Precautions: Fall Recall of Precautions/Restrictions: Intact Restrictions Weight Bearing Restrictions Per Provider Order: No      Mobility  Bed Mobility Overal bed mobility: Needs Assistance Bed Mobility: Supine to Sit     Supine to sit: Supervision     General bed mobility comments: pt required inc time to complete bed mobility due to labored movement    Transfers Overall transfer level: Needs assistance Equipment used: Rolling walker (2 wheels) Transfers: Sit to/from Stand, Bed to chair/wheelchair/BSC Sit to Stand: Contact guard assist   Step pivot transfers: Contact guard assist, Min assist       General transfer comment: CGA during transfers for safety, using RW    Ambulation/Gait Ambulation/Gait assistance: Contact guard assist, Min assist Gait Distance (Feet): 15 Feet Assistive device: Rolling walker (2 wheels) Gait Pattern/deviations: Decreased step length - right, Decreased step length - left, Decreased stride length, Trunk flexed Gait velocity: Dec     General Gait Details: Pt ambulates within room with RW and close CGA/mild min assist due to some unsteadiness, limited by fatigue with pt demo slow labored movement  Stairs            Wheelchair Mobility     Tilt Bed    Modified Rankin (Stroke Patients Only)       Balance Overall balance assessment: Needs assistance Sitting-balance  support: No upper extremity supported, Feet supported Sitting balance-Leahy Scale: Good Sitting balance - Comments: seated at EOB   Standing balance support: Bilateral upper extremity supported, During functional activity, Reliant on assistive device for balance Standing balance-Leahy  Scale: Fair Standing balance comment: using RW                             Pertinent Vitals/Pain Pain Assessment Pain Assessment: No/denies pain    Home Living Family/patient expects to be discharged to:: Private residence Living Arrangements: Children Available Help at Discharge: Family;Available PRN/intermittently Type of Home: House Home Access: Level entry       Home Layout: One level Home Equipment: [employer] (2 wheels);BSC/3in1;Shower seat;Grab bars - tub/shower Additional Comments: Pt reports his grand daughter can stay with him for a few days.    Prior Function Prior Level of Function : Independent/Modified Independent             Mobility Comments: pt reports as Household ambulation using RW ADLs Comments: Pt reports as independent ADL's and assist with IADL's.     Extremity/Trunk Assessment   Upper Extremity Assessment Upper Extremity Assessment: Defer to OT evaluation    Lower Extremity Assessment Lower Extremity Assessment: Generalized weakness (BLE strength grossly 4/5)    Cervical / Trunk Assessment Cervical / Trunk Assessment: Kyphotic  Communication   Communication Communication: Impaired Factors Affecting Communication: Hearing impaired    Cognition Arousal: Alert Behavior During Therapy: WFL for tasks assessed/performed                             Following commands: Intact       Cueing Cueing Techniques: Verbal cues, Tactile cues, Visual cues     General Comments      Exercises     Assessment/Plan    PT Assessment Patient needs continued PT services;All further PT needs can be met in the next venue of care  PT Problem List Decreased strength;Decreased activity tolerance;Decreased balance       PT Treatment Interventions DME instruction;Gait training;Stair training;Functional mobility training;Therapeutic activities;Therapeutic exercise;Balance training;Patient/family education    PT Goals  (Current goals can be found in the Care Plan section)  Acute Rehab PT Goals Patient Stated Goal: return home PT Goal Formulation: With patient Time For Goal Achievement: 04/21/24 Potential to Achieve Goals: Good    Frequency Min 3X/week     Co-evaluation PT/OT/SLP Co-Evaluation/Treatment: Yes Reason for Co-Treatment: To address functional/ADL transfers PT goals addressed during session: Mobility/safety with mobility OT goals addressed during session: ADL's and self-care       AM-PAC PT 6 Clicks Mobility  Outcome Measure Help needed turning from your back to your side while in a flat bed without using bedrails?: A Little Help needed moving from lying on your back to sitting on the side of a flat bed without using bedrails?: A Little Help needed moving to and from a bed to a chair (including a wheelchair)?: A Little Help needed standing up from a chair using your arms (e.g., wheelchair or bedside chair)?: A Little Help needed to walk in hospital room?: A Little Help needed climbing 3-5 steps with a railing? : A Lot 6 Click Score: 17    End of Session Equipment Utilized During Treatment: Gait belt Activity Tolerance: Patient tolerated treatment well;Patient limited by fatigue Patient left: in chair;with call bell/phone within reach;with chair alarm set Nurse Communication: Mobility status  PT Visit Diagnosis: Unsteadiness on feet (R26.81);Other abnormalities of gait and mobility (R26.89);Muscle weakness (generalized) (M62.81)    Time: 8995-8981 PT Time Calculation (min) (ACUTE ONLY): 14 min   Charges:   PT Evaluation $PT Eval Low Complexity: 1 Low   PT General Charges $$ ACUTE PT VISIT: 1 Visit         3:58 PM, 04/19/24 Eragon Hammond Powell-Butler, PT, DPT [facility] with [facility]

## 2024-04-19 NOTE — Telephone Encounter (Signed)
 Nurses Please fill out the medication portion of his form Please have the front fill out the other portion There is information either in previous electronic records or here within this particular form in regards to where to fax it to etc. Definitely make sure that a copy of this is printed into the medical record, also make sure his daughter Ava is aware that this is being completed thank you

## 2024-04-19 NOTE — Plan of Care (Signed)
" °  Problem: Acute Rehab OT Goals (only OT should resolve) Goal: Pt. Will Perform Grooming Flowsheets (Taken 04/19/2024 1217) Pt Will Perform Grooming: with modified independence Goal: Pt. Will Transfer To Toilet Flowsheets (Taken 04/19/2024 1217) Pt Will Transfer to Toilet:  with modified independence  ambulating Goal: Pt. Will Perform Toileting-Clothing Manipulation Flowsheets (Taken 04/19/2024 1217) Pt Will Perform Toileting - Clothing Manipulation and hygiene: with modified independence Goal: Pt/Caregiver Will Perform Home Exercise Program Flowsheets (Taken 04/19/2024 1217) Pt/caregiver will Perform Home Exercise Program:  Increased strength  Both right and left upper extremity  Independently  Mahir Prabhakar OT, MOT  "

## 2024-04-19 NOTE — Telephone Encounter (Signed)
 Patient currently inpatient in hospital

## 2024-04-19 NOTE — Plan of Care (Signed)
" °  Problem: Acute Rehab PT Goals(only PT should resolve) Goal: Pt Will Go Supine/Side To Sit Outcome: Progressing Flowsheets (Taken 04/19/2024 1558) Pt will go Supine/Side to Sit: Independently Goal: Patient Will Transfer Sit To/From Stand Outcome: Progressing Flowsheets (Taken 04/19/2024 1558) Patient will transfer sit to/from stand: with supervision Goal: Pt Will Transfer Bed To Chair/Chair To Bed Outcome: Progressing Flowsheets (Taken 04/19/2024 1558) Pt will Transfer Bed to Chair/Chair to Bed: with supervision Goal: Pt Will Ambulate Outcome: Progressing Flowsheets (Taken 04/19/2024 1558) Pt will Ambulate:  25 feet  with rolling walker  with supervision   3:59 PM, 04/19/24 Rosaria Settler, PT, DPT  with [facility]  "

## 2024-04-20 ENCOUNTER — Inpatient Hospital Stay: Admitting: Family Medicine

## 2024-04-20 ENCOUNTER — Other Ambulatory Visit: Payer: Self-pay | Admitting: Family Medicine

## 2024-04-20 LAB — GLUCOSE, BODY FLUID OTHER: Glucose, Body Fluid Other: 91 mg/dL

## 2024-04-20 LAB — GRAM STAIN

## 2024-04-20 LAB — AMYLASE, BODY FLUID (OTHER): Amylase, Body Fluid: 84 U/L

## 2024-04-20 LAB — PROTEIN, BODY FLUID (OTHER): Total Protein, Body Fluid Other: 4 g/dL

## 2024-04-20 LAB — LD, BODY FLUID (OTHER): LD, Body Fluid: 176 [IU]/L

## 2024-04-20 LAB — ALBUMIN, FLUID (OTHER): Albumin, Body Fluid Other: 1.6 g/dL

## 2024-04-20 MED ORDER — ALUM & MAG HYDROXIDE-SIMETH 200-200-20 MG/5ML PO SUSP
30.0000 mL | ORAL | Status: AC | PRN
  Administered 2024-04-20: 30 mL via ORAL
  Filled 2024-04-20: qty 30

## 2024-04-20 MED ORDER — AZITHROMYCIN 250 MG PO TABS
500.0000 mg | ORAL_TABLET | Freq: Every day | ORAL | Status: AC
  Administered 2024-04-20 – 2024-04-21 (×2): 500 mg via ORAL
  Filled 2024-04-20 (×2): qty 2

## 2024-04-20 MED ORDER — SPIRONOLACTONE 12.5 MG HALF TABLET
12.5000 mg | ORAL_TABLET | Freq: Every day | ORAL | Status: AC
  Administered 2024-04-20 – 2024-04-21 (×2): 12.5 mg via ORAL
  Filled 2024-04-20 (×2): qty 1

## 2024-04-20 MED ORDER — SODIUM CHLORIDE 0.9 % IV SOLN
1.0000 g | INTRAVENOUS | Status: DC
  Administered 2024-04-20: 1 g via INTRAVENOUS
  Filled 2024-04-20: qty 10

## 2024-04-20 MED ORDER — FUROSEMIDE 20 MG PO TABS
20.0000 mg | ORAL_TABLET | Freq: Every day | ORAL | Status: AC
  Administered 2024-04-20 – 2024-04-21 (×2): 20 mg via ORAL
  Filled 2024-04-20 (×2): qty 1

## 2024-04-20 NOTE — Progress Notes (Signed)
 " PROGRESS NOTE    PASCO MARCHITTO  FMW:[id number] DOB: [birth year] DOA: 04/18/2024 PCP: Alphonsa Glendia LABOR, MD  Subjective: No acute events overnight. Seen and examined at bedside. Reports no new complaints. Unable to provide much history due to altered mentation. Able to answer some simple questions and follow commands. Reports cough is better. Denies shortness of breath, chest pain, nausea, vomiting, constipation.    Hospital Course:  89 y.o. male with medical history significant for hypertension, asthma, GERD, chronic HFrEF 20-25%, chronic hypotension on midodrine  prior to admission, who presents to the ER via EMS from home due to reported syncopal episode.  The patient is unable to provide a history due to confusion.  No family members at bedside.  Reportedly, the patient was moved into a room where he stayed for about 20 minutes with propane heater that began having a leak.  He had some confusion noted by family members at home.  Associated with nausea without vomiting. No reported chest pain or palpitations.  It is unclear how he lost consciousness.  EMS was activated.  The patient was placed on O2 supplementation 4L via EMS.  He presented to the ER for further evaluation.    Admitted for acute encephalopathy, possible complicated PNA with recurrent L pleural effusion, and generalized weakness.   Assessment and Plan:  Possible necrotizing pneumonia  Possible mass-like consolidation in the lingula Remains afebrile with no leukocytosis Pro-calcitonin not elevated Bcx with NGTD, final read pending MRSA screen pending Repeat CXR 2/4 showed residual bilateral effusions and bibasilar atelectasis/consolidation Stop empiric cefepime  and IV linezolid  Start ceftriaxone  and azithromycin , duration TBD Pleural fluid gram stain and cultures were not ordered on admission, ordered now as add-on, lab confirmed they have enough sample Monitor clinically for respiratory symptoms   Moderate to large  loculated left pleural effusion Complete compressive atelectasis of the left lower lobe Status post IR diagnostic and therapeutic thoracentesis with removed. Follow up pleural fluid studies   Chronic HFrEF 20-25% Hx of Hypotension TTE showed LVEF < 20%, severely decreased LV function, global hypokinesis, moderately reduced RV systolic function, moderately elevated pulmonary artery systolic pressure, normal IVC Likely fluid overloaded with recurrent pleural effusion GDMT limited due to hx of severe hypotension-related shock, CKD Cont home midodrine  Resume home lasix  and spironolactone  Monitor strict I's and O's and daily weight Will likely need further goals of care given poor prognosis Will get palliative care consult   Elevated troponin, suspect demand ischemia in the setting of the above Denies chest pain High-sensitivity troponin 51, 51 No evidence of acute ischemia on 12-lead EKG TTE showed LVEF < 20%, severely decreased LV function, global hypokinesis, moderately reduced RV systolic function, moderately elevated pulmonary artery systolic pressure, normal IVC Continue to monitor on telemetry   Reported syncope, unclear circumstances The patient is alert and oriented x 2 at present. Appears near baseline as per family Orthostatic vital signs 2/4 negative TTE results as elsewhere, unchanged from prior Fall precautions.  Generalized weakness PT OT following, need HHPT and RN on discharge Fall precautions.  DVT prophylaxis: enoxaparin  (LOVENOX ) injection 40 mg Start: 04/18/24 2145  Lovenox    Code Status: Limited: Do not attempt resuscitation (DNR) -DNR-LIMITED -Do Not Intubate/DNI  Family Communication: spoke with daughter Ava over the phone and updated on current treatment plan Disposition Plan: Home with [facility] PT and RN Reason for continuing need for hospitalization: IV antibiotics, pleural fluid studies pending, palliative consult pending, monitor for clinical  improvement  Objective: Vitals:   04/19/24  1428 04/19/24 1956 04/20/24 0424 04/20/24 0542  BP: 91/62 104/65 110/70   Pulse: 88 62 87   Resp:  20 (!) 22   Temp: (!) 97.4 F (36.3 C) 97.6 F (36.4 C) 97.6 F (36.4 C)   TempSrc: Axillary Axillary Axillary   SpO2: 94% 96% 96%   Weight:    60.4 kg  Height:        Intake/Output Summary (Last 24 hours) at 04/20/2024 1302 Last data filed at 04/20/2024 0425 Gross per 24 hour  Intake --  Output 350 ml  Net -350 ml   Filed Weights   04/18/24 2158 04/19/24 0630 04/20/24 0542  Weight: 61.4 kg 60.2 kg 60.4 kg    Examination:  Physical Exam Vitals and nursing note reviewed.  Constitutional:      General: He is not in acute distress.    Appearance: He is ill-appearing.     Comments: Weak, frail  HENT:     Head: Normocephalic and atraumatic.  Cardiovascular:     Rate and Rhythm: Normal rate and regular rhythm.     Pulses: Normal pulses.     Heart sounds: Normal heart sounds.  Pulmonary:     Effort: Pulmonary effort is normal. No respiratory distress.     Breath sounds: Normal breath sounds. No wheezing.  Abdominal:     General: Bowel sounds are normal.     Palpations: Abdomen is soft.  Neurological:     Mental Status: He is alert.     Data Reviewed: I have personally reviewed following labs and imaging studies  CBC: Recent Labs  Lab 04/18/24 1423 04/18/24 1426 04/19/24 0702  WBC 8.6  --  6.3  NEUTROABS 7.6  --   --   HGB 10.8* 11.9* 9.1*  HCT 36.5* 35.0* 30.9*  MCV 104.0*  --  105.5*  PLT 169  --  146*   Basic Metabolic Panel: Recent Labs  Lab 04/18/24 1423 04/18/24 1426 04/19/24 0702  NA 141 141 140  K 4.0 3.9 3.9  CL 101 100 105  CO2 26  --  22  GLUCOSE 98 101* 82  BUN 13 13 13   CREATININE 1.20 1.20 1.12  CALCIUM  9.3  --  8.6*  MG  --   --  2.4  PHOS  --   --  2.0*   GFR: Estimated Creatinine Clearance: 33 mL/min (by C-G formula based on SCr of 1.12 mg/dL). Liver Function Tests: Recent Labs  Lab  04/18/24 1423 04/19/24 0702  AST 26  --   ALT 9  --   ALKPHOS 84  --   BILITOT 1.3*  --   PROT 7.5 6.9  ALBUMIN 3.3* 2.8*   No results for input(s): LIPASE, AMYLASE in the last 168 hours. No results for input(s): AMMONIA in the last 168 hours. Coagulation Profile: No results for input(s): INR, PROTIME in the last 168 hours. Cardiac Enzymes: No results for input(s): CKTOTAL, CKMB, CKMBINDEX, TROPONINI in the last 168 hours. ProBNP, BNP (last 5 results) Recent Labs    10/12/23 1305 01/19/24 1230 03/30/24 1802  PROBNP  --  10,382.0* 6,350.0*  BNP 299.0*  --   --    HbA1C: No results for input(s): HGBA1C in the last 72 hours. CBG: No results for input(s): GLUCAP in the last 168 hours. Lipid Profile: No results for input(s): CHOL, HDL, LDLCALC, TRIG, CHOLHDL, LDLDIRECT in the last 72 hours. Thyroid  Function Tests: No results for input(s): TSH, T4TOTAL, FREET4, T3FREE, THYROIDAB in the last 72 hours.  Anemia Panel: No results for input(s): VITAMINB12, FOLATE, FERRITIN, TIBC, IRON, RETICCTPCT in the last 72 hours. Sepsis Labs: Recent Labs  Lab 04/18/24 1423 04/18/24 1720 04/19/24 0702  PROCALCITON  --   --  0.39  LATICACIDVEN 3.0* 1.9  --     Recent Results (from the past 240 hours)  Blood culture (routine x 2)     Status: None (Preliminary result)   Collection Time: 04/18/24  2:23 PM   Specimen: BLOOD  Result Value Ref Range Status   Specimen Description BLOOD BOTTLES DRAWN AEROBIC ONLY  Final   Special Requests BLOOD RIGHT WRIST Blood Culture adequate volume  Final   Culture   Final    NO GROWTH 2 DAYS Performed at [facility], [address] [place], [place] [address]    Report Status PENDING  Incomplete  Blood culture (routine x 2)     Status: None (Preliminary result)   Collection Time: 04/18/24  6:05 PM   Specimen: BLOOD  Result Value Ref Range Status   Specimen Description BLOOD LEFT ANTECUBITAL   Final   Special Requests   Final    BOTTLES DRAWN AEROBIC ONLY Blood Culture adequate volume   Culture   Final    NO GROWTH 2 DAYS Performed at [facility], [address] [place], [place], [place] [address]    Report Status PENDING  Incomplete     Radiology Studies: ECHOCARDIOGRAM LIMITED Result Date: 04/19/2024    ECHOCARDIOGRAM LIMITED REPORT   Patient Name:   MANA MORISON Date of Exam: 04/19/2024 Medical Rec #:  [id number]           Height:       71.0 in Accession #:    [id number]          Weight:       132.7 lb Date of Birth:  1925-07-15           BSA:          1.771 m Patient Age:    89 years            BP:           105/71 mmHg Patient Gender: M                   HR:           54 bpm. Exam Location:  [facility] Procedure: Limited Echo, Limited Color Doppler and Intracardiac Opacification            Agent (Both Spectral and Color Flow Doppler were utilized during            procedure). Indications:    Syncope R55  History:        Patient has prior history of Echocardiogram examinations, most                 recent 01/18/2024. Signs/Symptoms:Murmur; Risk                 Factors:Hypertension.  Sonographer:    Sydnee Wilson RDCS Referring Phys: [id number] CAROLE N HALL IMPRESSIONS  1. No LV thrombus by Definity . Left ventricular ejection fraction, by estimation, is <20%. The left ventricle has severely decreased function. The left ventricle demonstrates global hypokinesis. The left ventricular internal cavity size was mildly dilated. Left ventricular diastolic parameters are consistent with Grade I diastolic dysfunction (impaired relaxation). Elevated left atrial pressure.  2. Right ventricular systolic function is moderately reduced. The right ventricular size is mildly enlarged. There is moderately  elevated pulmonary artery systolic pressure. The estimated right ventricular systolic pressure is 46.4 mmHg.  3. The mitral valve is normal in structure. Mild to moderate mitral valve regurgitation. No evidence of  mitral stenosis.  4. The aortic valve is tricuspid. Aortic valve regurgitation is mild. No aortic stenosis is present. Aortic regurgitation PHT measures 648 msec.  5. The inferior vena cava is normal in size with <50% respiratory variability, suggesting right atrial pressure of 8 mmHg. Comparison(s): No significant change from prior study. FINDINGS  Left Ventricle: No LV thrombus by Definity . Left ventricular ejection fraction, by estimation, is <20%. The left ventricle has severely decreased function. The left ventricle demonstrates global hypokinesis. Definity  contrast agent was given IV to delineate the left ventricular endocardial borders. The left ventricular internal cavity size was mildly dilated. There is no left ventricular hypertrophy. Left ventricular diastolic parameters are consistent with Grade I diastolic dysfunction (impaired relaxation). Elevated left atrial pressure. Right Ventricle: The right ventricular size is mildly enlarged. No increase in right ventricular wall thickness. Right ventricular systolic function is moderately reduced. There is moderately elevated pulmonary artery systolic pressure. The tricuspid regurgitant velocity is 3.10 m/s, and with an assumed right atrial pressure of 8 mmHg, the estimated right ventricular systolic pressure is 46.4 mmHg. Left Atrium: Left atrial size was normal in size. Right Atrium: Right atrial size was normal in size. Pericardium: There is no evidence of pericardial effusion. Mitral Valve: The mitral valve is normal in structure. Mild to moderate mitral valve regurgitation. No evidence of mitral valve stenosis. Tricuspid Valve: The tricuspid valve is normal in structure. Tricuspid valve regurgitation is mild . No evidence of tricuspid stenosis. Aortic Valve: The aortic valve is tricuspid. Aortic valve regurgitation is mild. Aortic regurgitation PHT measures 648 msec. No aortic stenosis is present. Aortic valve peak gradient measures 8.2 mmHg. Pulmonic  Valve: The pulmonic valve was not well visualized. Pulmonic valve regurgitation is not visualized. No evidence of pulmonic stenosis. Aorta: The aortic root is normal in size and structure. Venous: The inferior vena cava is normal in size with less than 50% respiratory variability, suggesting right atrial pressure of 8 mmHg. IAS/Shunts: No atrial level shunt detected by color flow Doppler. LEFT VENTRICLE PLAX 2D LVIDd:         5.40 cm      Diastology LVIDs:         5.20 cm      LV e' medial:    4.13 cm/s LV PW:         0.90 cm      LV E/e' medial:  25.2 LV IVS:        1.10 cm      LV e' lateral:   4.90 cm/s                             LV E/e' lateral: 21.2  LV Volumes (MOD) LV vol d, MOD A2C: 214.0 ml LV vol d, MOD A4C: 228.0 ml LV vol s, MOD A2C: 154.0 ml LV vol s, MOD A4C: 183.0 ml LV SV MOD A2C:     60.0 ml LV SV MOD A4C:     228.0 ml LV SV MOD BP:      55.9 ml RIGHT VENTRICLE RV S prime:     5.11 cm/s TAPSE (M-mode): 1.4 cm AORTIC VALVE AV Vmax:      143.00 cm/s AV Peak Grad: 8.2 mmHg AI PHT:  648 msec MITRAL VALVE                TRICUSPID VALVE MV Area (PHT): 4.29 cm     TR Peak grad:   38.4 mmHg MV Decel Time: 177 msec     TR Vmax:        310.00 cm/s MV E velocity: 104.00 cm/s MV A velocity: 106.00 cm/s MV E/A ratio:  0.98 Vishnu Priya Mallipeddi Electronically signed by Diannah Late Mallipeddi Signature Date/Time: 04/19/2024/3:05:32 PM    Final    DG Chest Portable 1 View Result Date: 04/19/2024 CLINICAL DATA:  Status post left thoracentesis, pleural effusion EXAM: PORTABLE CHEST 1 VIEW COMPARISON:  04/18/2024 FINDINGS: Interval improvement in the left effusion following thoracentesis. Suspect some degree of incomplete left lower lung re-expansion. No large pneumothorax. Residual effusions remain bilaterally. Collapse/consolidation in lingula and left lower lobe persist. Minor right base atelectasis. Degenerative changes and osteopenia of the bones. Aorta atherosclerotic. Trachea midline. IMPRESSION: 1.  Improved left effusion following thoracentesis. No large pneumothorax. 2. Residual bilateral effusions and bibasilar atelectasis/consolidation. Electronically Signed   By: CHRISTELLA.  Shick M.D.   On: 04/19/2024 13:59   US  THORACENTESIS LEFT ASP PLEURAL SPACE W/IMG GUIDE Result Date: 04/19/2024 INDICATION: 89 year old male. History of CHF. Found to have left-sided pleural effusion. Request is for therapeutic and diagnostic left-sided thoracentesis. EXAM: ULTRASOUND GUIDED THERAPEUTIC LEFT-SIDED THORACENTESIS MEDICATIONS: Lidocaine  1% 10 mL COMPLICATIONS: None immediate. PROCEDURE: An ultrasound guided thoracentesis was thoroughly discussed with the patient and questions answered. The benefits, risks, alternatives and complications were also discussed. The patient understands and wishes to proceed with the procedure. Written consent was obtained. Ultrasound was performed to localize and mark an adequate pocket of fluid in the left chest. The area was then prepped and draped in the normal sterile fashion. 1% Lidocaine  was used for local anesthesia. Under ultrasound guidance a 6 Fr Safe-T-Centesis catheter was introduced. Thoracentesis was performed. The catheter was removed and a dressing applied. FINDINGS: A total of approximately 950 mL of serosanguineous fluid was removed. Samples were sent to the laboratory as requested by the clinical team. IMPRESSION: Successful ultrasound guided therapeutic and diagnostic left-sided thoracentesis yielding 950 mL of serosanguineous pleural fluid. Performed by Delon Beagle NP Electronically Signed   By: CHRISTELLA.  Shick M.D.   On: 04/19/2024 13:56   CT CHEST ABDOMEN PELVIS W CONTRAST Result Date: 04/18/2024 EXAM: CT CHEST, ABDOMEN AND PELVIS WITH CONTRAST 04/18/2024 04:46:33 PM TECHNIQUE: CT of the chest, abdomen and pelvis was performed with the administration of 100 mL of iohexol  (OMNIPAQUE ) 300 milligrams per milliliter solution. Multiplanar reformatted images are provided for  review. Automated exposure control, iterative reconstruction, and/or weight based adjustment of the milliamperes/kilovolts was utilized to reduce the radiation dose to as low as reasonably achievable. COMPARISON: 04/18/2024, 12/02/2023, and 05/22/2019. CLINICAL HISTORY: Epigastric pain. FINDINGS: CHEST: MEDIASTINUM AND LYMPH NODES: Moderate cardiomegaly. Multivessel coronary atherosclerosis. Scattered aortic atherosclerosis. Rightward shift of the cardiomediastinal structures. Intraluminal nodularity within the upper esophagus at the thoracic inlet, measuring 1 cm (axial 9 and 10). Moderate amount of layering fluid in the mid and distal esophagus. The central airways are clear. No mediastinal, hilar or axillary lymphadenopathy. LUNGS AND PLEURA: While not optimized for the evaluation of the pulmonary arteries, no pulmonary embolism visualized. Moderate to large left pleural effusion, which is loculated. Near complete compressive atelectasis of the left lower lobe. Small right pleural effusion with right basilar atelectasis. Mild centrilobular emphysema throughout the lungs. Hypodense masslike consolidation in the lingula centrally, abutting  the mediastinum, measuring 6.2 x 4.2 x 5.4 cm (axial 40/60). No pneumothorax. ABDOMEN AND PELVIS: LIVER: Subcentimeter hypodensity in the lateral right hepatic lobe, too small to definitively characterize, but likely benign given the stability. GALLBLADDER AND BILE DUCTS: Unremarkable. No biliary ductal dilatation. SPLEEN: No acute abnormality. PANCREAS: No acute abnormality. ADRENAL GLANDS: No acute abnormality. KIDNEYS, URETERS AND BLADDER: Mild renal cortical atrophy of both kidneys. Small right lower pole cyst, unchanged. Per consensus, no follow-up is needed for simple Bosniak type 1 and 2 renal cysts, unless the patient has a malignancy history or risk factors. Subchondral aortic left renal vein, a normal variant. The urinary bladder is completely decompressed with a  urinary catheter in place. No stones in the kidneys or ureters. No hydronephrosis. No perinephric or periureteral stranding. GI AND BOWEL: Stomach is completely decompressed. Total colonic diverticulosis. No changes of acute diverticulitis. Appendectomy. There is no bowel obstruction. REPRODUCTIVE ORGANS: Mild prostatomegaly. PERITONEUM AND RETROPERITONEUM: No ascites. No free air. VASCULATURE: Diffuse aortoiliac atherosclerosis. Unchanged fusiform aneurysm of the infrarenal aorta measuring 3 x 3.2 cm. ABDOMINAL AND PELVIS LYMPH NODES: No lymphadenopathy. BONES AND SOFT TISSUES: Diffuse osteopenia. Advanced multilevel degenerative disc disease of the spine. Mild grade 1 anterolisthesis of L3 on L4. Mild osteoarthritis of both hips. Diffuse anasarca. No focal soft tissue abnormality. IMPRESSION: 1. Hypodense masslike consolidation in the lingula abutting the mediastinum measuring 6.2 x 4.2 x 5.4 cm. This could represent either a region of necrotizing pneumonia or consolidation due to an underlying neoplasm. Moderate to large loculated left pleural effusion with near complete compressive atelectasis of the left lower lobe. 2. Intraluminal nodularity within the upper esophagus at the thoracic inlet measuring 1 cm. While this could represent ingested material, a small polyp or developing neoplasm could have this appearance in the correct clinical context. Nonemergent upper endoscopy may be of benefit for further characterization. 3. Small right pleural effusion. Diffuse anasarca. This may be related to the patient's volume status. 4. Mild prostatomegaly. Electronically signed by: Rogelia Myers MD 04/18/2024 05:22 PM EST RP Workstation: [id number]   CT Head Wo Contrast Result Date: 04/18/2024 EXAM: CT HEAD WITHOUT CONTRAST 04/18/2024 04:46:33 PM TECHNIQUE: CT of the head was performed without the administration of intravenous contrast. Automated exposure control, iterative reconstruction, and/or weight based  adjustment of the mA/kV was utilized to reduce the radiation dose to as low as reasonably achievable. COMPARISON: CT head 11/15/2019 and MRI brain 11/09/2019. CLINICAL HISTORY: Syncope/presyncope, cerebrovascular cause suspected. FINDINGS: BRAIN AND VENTRICLES: No acute hemorrhage. No evidence of acute territorial infarct. No hydrocephalus. No extra-axial collection. No mass effect or midline shift. There is overall similar moderate scattered white matter hypodensities which are nonspecific but most commonly represent chronic microvascular ischemic changes. ORBITS: No acute abnormality. SINUSES: No acute abnormality. SOFT TISSUES AND SKULL: No acute soft tissue abnormality. No calvarial fracture. IMPRESSION: 1. No acute intracranial hemorrhage or calvarial fracture. Electronically signed by: Prentice Spade MD 04/18/2024 05:02 PM EST RP Workstation: [id number]   DG Chest Port 1 View Result Date: 04/18/2024 CLINICAL DATA:  Weakness EXAM: PORTABLE CHEST 1 VIEW COMPARISON:  March 31, 2024 FINDINGS: Stable cardiomegaly. Stable moderate size loculated left pleural effusion with associated atelectasis. Minimal right pleural effusion is noted with minimal right basilar subsegmental atelectasis. Bony thorax is unremarkable. IMPRESSION: Stable moderate size loculated left pleural effusion with associated atelectasis. Minimal right pleural effusion is noted with minimal right basilar subsegmental atelectasis. Electronically Signed   By: Lynwood Landy Raddle M.D.   On: 04/18/2024 14:34  Scheduled Meds:  aspirin   81 mg Oral Daily   azithromycin   500 mg Oral Daily   Chlorhexidine  Gluconate Cloth  6 each Topical Daily   dapagliflozin  propanediol  5 mg Oral Daily   enoxaparin  (LOVENOX ) injection  40 mg Subcutaneous Q24H   feeding supplement  237 mL Oral BID BM   midodrine   10 mg Oral TID WC   mirtazapine   7.5 mg Oral QHS   rosuvastatin   20 mg Oral Daily   Continuous Infusions:  cefTRIAXone  (ROCEPHIN )  IV        LOS: 2 days   Norval Bar, MD  [facility]  04/20/2024, 1:02 PM   "

## 2024-04-20 NOTE — Plan of Care (Signed)

## 2024-04-20 NOTE — Telephone Encounter (Signed)
 Have completed the nurses portion and other pertinent information needed, given to front desk for fax/ pick up by family member

## 2024-04-21 ENCOUNTER — Other Ambulatory Visit: Payer: Self-pay | Admitting: Family Medicine

## 2024-04-21 ENCOUNTER — Inpatient Hospital Stay (HOSPITAL_COMMUNITY)

## 2024-04-21 LAB — CBC WITH DIFFERENTIAL/PLATELET
Abs Immature Granulocytes: 0.02 10*3/uL (ref 0.00–0.07)
Basophils Absolute: 0 10*3/uL (ref 0.0–0.1)
Basophils Relative: 1 %
Eosinophils Absolute: 0 10*3/uL (ref 0.0–0.5)
Eosinophils Relative: 0 %
HCT: 32.7 % — ABNORMAL LOW (ref 39.0–52.0)
Hemoglobin: 10.1 g/dL — ABNORMAL LOW (ref 13.0–17.0)
Immature Granulocytes: 0 %
Lymphocytes Relative: 5 %
Lymphs Abs: 0.4 10*3/uL — ABNORMAL LOW (ref 0.7–4.0)
MCH: 30.9 pg (ref 26.0–34.0)
MCHC: 30.9 g/dL (ref 30.0–36.0)
MCV: 100 fL (ref 80.0–100.0)
Monocytes Absolute: 0.6 10*3/uL (ref 0.1–1.0)
Monocytes Relative: 7 %
Neutro Abs: 7.1 10*3/uL (ref 1.7–7.7)
Neutrophils Relative %: 87 %
Platelets: 166 10*3/uL (ref 150–400)
RBC: 3.27 MIL/uL — ABNORMAL LOW (ref 4.22–5.81)
RDW: 14.2 % (ref 11.5–15.5)
WBC: 8.3 10*3/uL (ref 4.0–10.5)
nRBC: 0 % (ref 0.0–0.2)

## 2024-04-21 LAB — CULTURE, BLOOD (ROUTINE X 2)
Culture: NO GROWTH
Culture: NO GROWTH
Special Requests: ADEQUATE
Special Requests: ADEQUATE

## 2024-04-21 LAB — CULTURE, BODY FLUID W GRAM STAIN -BOTTLE
Culture: NO GROWTH
Special Requests: ADEQUATE

## 2024-04-21 LAB — MRSA NEXT GEN BY PCR, NASAL: MRSA by PCR Next Gen: NOT DETECTED

## 2024-04-21 LAB — BASIC METABOLIC PANEL WITH GFR
Anion gap: 16 — ABNORMAL HIGH (ref 5–15)
BUN: 15 mg/dL (ref 8–23)
CO2: 20 mmol/L — ABNORMAL LOW (ref 22–32)
Calcium: 8.8 mg/dL — ABNORMAL LOW (ref 8.9–10.3)
Chloride: 101 mmol/L (ref 98–111)
Creatinine, Ser: 1.36 mg/dL — ABNORMAL HIGH (ref 0.61–1.24)
GFR, Estimated: 48 mL/min — ABNORMAL LOW
Glucose, Bld: 97 mg/dL (ref 70–99)
Potassium: 3.6 mmol/L (ref 3.5–5.1)
Sodium: 137 mmol/L (ref 135–145)

## 2024-04-21 LAB — TROPONIN T, HIGH SENSITIVITY
Troponin T High Sensitivity: 56 ng/L — ABNORMAL HIGH (ref 0–19)
Troponin T High Sensitivity: 58 ng/L — ABNORMAL HIGH (ref 0–19)

## 2024-04-21 LAB — MAGNESIUM: Magnesium: 2.2 mg/dL (ref 1.7–2.4)

## 2024-04-21 MED ORDER — MAGNESIUM SULFATE 2 GM/50ML IV SOLN
2.0000 g | Freq: Once | INTRAVENOUS | Status: AC
  Administered 2024-04-21: 2 g via INTRAVENOUS
  Filled 2024-04-21: qty 50

## 2024-04-21 MED ORDER — NOREPINEPHRINE 4 MG/250ML-% IV SOLN
0.0000 ug/min | INTRAVENOUS | Status: DC
  Administered 2024-04-21: 4 ug/min via INTRAVENOUS

## 2024-04-21 MED ORDER — NOREPINEPHRINE 4 MG/250ML-% IV SOLN
0.0000 ug/min | INTRAVENOUS | Status: AC
  Administered 2024-04-21: 9 ug/min via INTRAVENOUS
  Administered 2024-04-21: 5 ug/min via INTRAVENOUS
  Filled 2024-04-21: qty 250

## 2024-04-21 MED ORDER — SODIUM CHLORIDE 0.9 % IV SOLN: 250.0000 mL | INTRAVENOUS | Status: AC

## 2024-04-21 MED ORDER — PIPERACILLIN-TAZOBACTAM 3.375 G IVPB
3.3750 g | Freq: Three times a day (TID) | INTRAVENOUS | Status: AC
  Administered 2024-04-21: 3.375 g via INTRAVENOUS
  Filled 2024-04-21: qty 50

## 2024-04-21 MED ORDER — NOREPINEPHRINE 4 MG/250ML-% IV SOLN
INTRAVENOUS | Status: AC
  Filled 2024-04-21: qty 250

## 2024-04-21 NOTE — Progress Notes (Signed)
 Mobility Specialist Progress Note:    04/21/24 1035  Mobility  Activity Pivoted/transferred from bed to chair;Ambulated with assistance  Level of Assistance Minimal assist, patient does 75% or more  Assistive Device Front wheel walker  Distance Ambulated (ft) 5 ft  Range of Motion/Exercises Active;All extremities  Activity Response Tolerated well  Mobility Referral Yes  Mobility visit 1 Mobility  Mobility Specialist Start Time (ACUTE ONLY) 1035  Mobility Specialist Stop Time (ACUTE ONLY) 1055  Mobility Specialist Time Calculation (min) (ACUTE ONLY) 20 min   Pt received in bed, agreeable to mobility. Required MinA to stand and ambulate with RW. Tolerated well, cognitive deficits. Left in chair, alarm on.  Call bell in reach, all needs met.  Yasheka Fossett Mobility Specialist Please contact via Special Educational Needs Teacher or  Rehab office at [phone]

## 2024-04-21 NOTE — Progress Notes (Signed)
 SPIRITUAL CARE AND COUNSELING CONSULT NOTE   VISIT SUMMARY Responded to Code Blue today. Arrived in patient room and patient was speaking. His blood pressure was very low and his heart rate high, according to attending physician. He is transferring to ICU room 11. Sat with patient son Cheryl and Dr. Sigdel as he shared how his father has a very low EF and is a DNR. Engaged patient son in conversation around goals of care and what his father wanted and didn't want. It is clear that he wanted to remain a DNR and given his heart failure, he would like Dr. Mcarthur to go ahead and begin support to help bring his fathers' blood pressure up so that his siblings and patient children can come to bedside. Supported this decision making and facilitated conversation and provided compassionate presence and reflective listening this afternoon. Children will need on-going support as their father declines. Will refer to Saturday chaplain for follow-up in order to provide continued spiritual care and to assess for spiritual need.   SPIRITUAL ENCOUNTER                                                                                                                                                                      Type of Visit: Initial Care provided to:: Family Conversation partners present during encounter: Physician Referral source: Code page Reason for visit: Code OnCall Visit: No   SPIRITUAL FRAMEWORK  Presenting Themes: Significant life change Community/Connection: Family, Friend(s) Patient Stress Factors: None identified Family Stress Factors: Major life changes, Loss   GOALS       INTERVENTIONS   Spiritual Care Interventions Made: Established relationship of care and support, Compassionate presence, Reflective listening, Decision-making support/facilitation    INTERVENTION OUTCOMES   Outcomes: Patient family open to resources  SPIRITUAL CARE PLAN   Spiritual Care Issues Still Outstanding:  Chaplain will continue to follow, Referring to oncoming chaplain for further support    If immediate needs arise, please consult Zelda Salmon [facility] schedule for chaplain coverage   Tanda PORTA [facility], Chaplain  04/21/2024 4:16 PM

## 2024-04-21 NOTE — Clinical Note (Incomplete)
 Pt HR in 170s to 190s, Pt c/o SOB. Placed on 2L n/c for comfort. Notified MD. New orders for Chest xray and EKG was given. HR increased to 200s, Pt c/o chest pain and SOB. Rapid Response was called, labs were obtained, CRX completed and EKG completed. Pt was transferred to ICU bed 11. Family notified, report given to receiving ICU nurse.

## 2024-04-21 NOTE — Progress Notes (Addendum)
 " PROGRESS NOTE    [name]  FMW:[id number] DOB: 1925-05-09 DOA: 04/18/2024 PCP: Alphonsa Glendia LABOR, MD  Subjective:  Patient seen and examined at the bedside multiple times today.  He became tachycardic with altered mentation in the afternoon today.  Heart rate went more than 150.  EKG was done showed broad complex tachycardia, undetermined rhythm, looked like right bundle branch block.  His blood pressure was low in 70s systolic.  He was arousable but could not communicate.  We decided to start on Levophed  and transferred patient to the ICU.  Discussed first with son who was at the bedside and later on second son as well as daughters arrived.  Goals of care was discussed.  Discussed poor prognosis with his multiple comorbidities especially heart failure with low EF 20%, ongoing left pleural effusion which is loculated.  Arrhythmia on top of that.  Hypotension.  Patient is already DNR/DNI.  We discussed about continuing current treatment versus transitioning to a more comfort approach and hospice.  Earlier in the day patient expressed that he wanted to go home.  Family would like to discuss among themselves and they are agreeable with consulting hospice to discuss services they can provide.\Palliative medicine was consulted however they have not seen patient yet.   Hospital Course:  89 y.o. male with medical history significant for hypertension, asthma, GERD, chronic HFrEF 20-25%, chronic hypotension on midodrine  prior to admission, who presents to the ER via EMS from home due to reported syncopal episode.  The patient is unable to provide a history due to confusion.  No family members at bedside.  Reportedly, the patient was moved into a room where he stayed for about 20 minutes with propane heater that began having a leak.  He had some confusion noted by family members at home.  Associated with nausea without vomiting.  No reported chest pain or palpitations.  It is unclear how he lost  consciousness.  EMS was activated.  The patient was placed on O2 supplementation 4L via EMS.  He presented to the ER for further evaluation.   Admitted for acute encephalopathy, possible complicated PNA with recurrent L pleural effusion, and generalized weakness.   Assessment and Plan:  Chronic HFrEF 20-25% Hx of Hypotension Wide complex tachycardia TTE showed LVEF < 20%, severely decreased LV function, global hypokinesis, moderately reduced RV systolic function, moderately elevated pulmonary artery systolic pressure, normal IVC Tachycardia with hypotension 2/6.  Wide-complex, undetermined rhythm. Associated hypotension and initiated on Levophed . Patient is DNR. Discussed with family.  Will proceed with hospice consultation.  Possible necrotizing pneumonia  Possible mass-like consolidation in the lingula Pro-calcitonin not elevated Bcx with NGTD, final read pending Repeat CXR showed residual bilateral effusions and bibasilar atelectasis/consolidation Currently on ceftriaxone .  Chest x-ray today shows persistent left-sided loculated effusion.  Will DC ceftriaxone  and start Zosyn  for broad coverage Follow up with on pleural fluid cultures Monitor clinically for respiratory symptoms   Moderate to large loculated left pleural effusion Complete compressive atelectasis of the left lower lobe Status post IR diagnostic thoracentesis with removed.  2/4. Follow up pleural fluid studies   elevated troponin, suspect demand ischemia in the setting of the above Denies chest pain High-sensitivity troponin 51, 51 No evidence of acute ischemia on 12-lead EKG TTE showed LVEF < 20%, severely decreased LV function, global hypokinesis, moderately reduced RV systolic function, moderately elevated pulmonary artery systolic pressure, normal IVC Continue to monitor on telemetry   Reported syncope, prior to presentation  likely  from malignant arrhythmia and associated hypotension given very poor LV  function   Chronic hypotension Cont home midodrine  Hold home lasix  and spironolactone   Generalized weakness PT OT following Fall precautions.  DVT prophylaxis: enoxaparin  (LOVENOX ) injection 40 mg Start: 04/18/24 2145  Lovenox    Code Status: Limited: Do not attempt resuscitation (DNR) -DNR-LIMITED -Do Not Intubate/DNI   Disposition Plan: Likely home with hospice.  Will monitor his situation and stability.  Palliative consulted however they have not seen patient yet. reason for continuing need for hospitalization: IV antibiotics, monitor for clinical improvement  Objective: Vitals:   04/21/24 1445 04/21/24 1500 04/21/24 1600 04/21/24 1626  BP: (!) 93/59 (!) 100/54 115/73   Pulse: (!) 123 (!) 133 (!) 105   Resp: (!) 22 (!) 21 (!) 24   Temp:  98.7 F (37.1 C)  98.5 F (36.9 C)  TempSrc:  Axillary  Axillary  SpO2: 97% 96%    Weight:      Height:        Intake/Output Summary (Last 24 hours) at 04/21/2024 1704 Last data filed at 04/21/2024 0530 Gross per 24 hour  Intake 100 ml  Output 1100 ml  Net -1000 ml   Filed Weights   04/19/24 0630 04/20/24 0542 04/21/24 0515  Weight: 60.2 kg 60.4 kg 65.5 kg    Examination:  Examined multiple times: General: Ill looking, lethargic in the afternoon but became alert after improvement in blood pressure Chest: Diminished air entry at bases, no wheezing CVS: S1, S2, murmur present, tachycardia, regular Abdomen: Soft, nontender Extremities: No edema  Data Reviewed: I have personally reviewed following labs and imaging studies  CBC: Recent Labs  Lab 04/18/24 1423 04/18/24 1426 04/19/24 0702 04/21/24 1443  WBC 8.6  --  6.3 8.3  NEUTROABS 7.6  --   --  7.1  HGB 10.8* 11.9* 9.1* 10.1*  HCT 36.5* 35.0* 30.9* 32.7*  MCV 104.0*  --  105.5* 100.0  PLT 169  --  146* 166   Basic Metabolic Panel: Recent Labs  Lab 04/18/24 1423 04/18/24 1426 04/19/24 0702 04/21/24 1443  NA 141 141 140 137  K 4.0 3.9 3.9 3.6  CL 101 100 105 101   CO2 26  --  22 20*  GLUCOSE 98 101* 82 97  BUN 13 13 13 15   CREATININE 1.20 1.20 1.12 1.36*  CALCIUM  9.3  --  8.6* 8.8*  MG  --   --  2.4 2.2  PHOS  --   --  2.0*  --    GFR: Estimated Creatinine Clearance: 29.4 mL/min (A) (by C-G formula based on SCr of 1.36 mg/dL (H)). Liver Function Tests: Recent Labs  Lab 04/18/24 1423 04/19/24 0702  AST 26  --   ALT 9  --   ALKPHOS 84  --   BILITOT 1.3*  --   PROT 7.5 6.9  ALBUMIN 3.3* 2.8*   No results for input(s): LIPASE, AMYLASE in the last 168 hours. No results for input(s): AMMONIA in the last 168 hours. Coagulation Profile: No results for input(s): INR, PROTIME in the last 168 hours. Cardiac Enzymes: No results for input(s): CKTOTAL, CKMB, CKMBINDEX, TROPONINI in the last 168 hours. ProBNP, BNP (last 5 results) Recent Labs    10/12/23 1305 01/19/24 1230 03/30/24 1802  PROBNP  --  10,382.0* 6,350.0*  BNP 299.0*  --   --    HbA1C: No results for input(s): HGBA1C in the last 72 hours. CBG: No results for input(s): GLUCAP in the last 168  hours. Lipid Profile: No results for input(s): CHOL, HDL, LDLCALC, TRIG, CHOLHDL, LDLDIRECT in the last 72 hours. Thyroid  Function Tests: No results for input(s): TSH, T4TOTAL, FREET4, T3FREE, THYROIDAB in the last 72 hours. Anemia Panel: No results for input(s): VITAMINB12, FOLATE, FERRITIN, TIBC, IRON, RETICCTPCT in the last 72 hours. Sepsis Labs: Recent Labs  Lab 04/18/24 1423 04/18/24 1720 04/19/24 0702  PROCALCITON  --   --  0.39  LATICACIDVEN 3.0* 1.9  --     Recent Results (from the past 240 hours)  Blood culture (routine x 2)     Status: None (Preliminary result)   Collection Time: 04/18/24  2:23 PM   Specimen: BLOOD  Result Value Ref Range Status   Specimen Description BLOOD BOTTLES DRAWN AEROBIC ONLY  Final   Special Requests BLOOD RIGHT WRIST Blood Culture adequate volume  Final   Culture   Final    NO GROWTH  3 DAYS Performed at [facility], [address] [place], [place] [address]    Report Status PENDING  Incomplete  Blood culture (routine x 2)     Status: None (Preliminary result)   Collection Time: 04/18/24  6:05 PM   Specimen: BLOOD  Result Value Ref Range Status   Specimen Description BLOOD LEFT ANTECUBITAL  Final   Special Requests   Final    BOTTLES DRAWN AEROBIC ONLY Blood Culture adequate volume   Culture   Final    NO GROWTH 3 DAYS Performed at [facility], [address] [place], [place] [address]    Report Status PENDING  Incomplete  Gram stain     Status: None   Collection Time: 04/19/24  1:30 PM   Specimen: Pleura  Result Value Ref Range Status   Specimen Description PLEURAL  Final   Special Requests PLEURAL  Final   Gram Stain   Final    PLEURAL WBC PRESENT, PREDOMINANTLY MONONUCLEAR NO ORGANISMS SEEN CYTOSPIN SMEAR Performed at [facility], [address], [place], [place] [address]    Report Status 04/20/2024 FINAL  Final  Culture, body fluid w Gram Stain-bottle     Status: None (Preliminary result)   Collection Time: 04/19/24  1:30 PM   Specimen: Pleura  Result Value Ref Range Status   Specimen Description PLEURAL  Final   Special Requests   Final    BOTTLES DRAWN AEROBIC AND ANAEROBIC Blood Culture adequate volume   Culture   Final    NO GROWTH < 24 HOURS Performed at [facility], [address], [place], [place] [address]    Report Status PENDING  Incomplete  MRSA Next Gen by PCR, Nasal     Status: None   Collection Time: 04/21/24  2:16 PM   Specimen: Nasal Mucosa; Nasal Swab  Result Value Ref Range Status   MRSA by PCR Next Gen NOT DETECTED NOT DETECTED Final    Comment: (NOTE) The GeneXpert MRSA Assay (FDA approved for NASAL specimens only), is one component of a comprehensive MRSA colonization surveillance program. It is not intended to diagnose MRSA infection nor to guide or monitor treatment for MRSA infections. Test performance is not FDA  approved in patients less than 69 years old. Performed at [facility], [address], [place], [place] [address]      Radiology Studies: DG CHEST PORT 1 VIEW Result Date: 04/21/2024 CLINICAL DATA:  Shortness of breath EXAM: PORTABLE CHEST 1 VIEW COMPARISON:  April 19, 2024. FINDINGS: Stable cardiomegaly. Moderate size left pleural effusion which is increased compared to  prior exam. Probable small right pleural effusion is noted with associated atelectasis. Right lung base is incompletely visualized on this radiograph however. IMPRESSION: Moderate size left pleural effusion is noted which is increased compared to prior exam. Probable small right pleural effusion is noted with associated atelectasis. Electronically Signed   By: Lynwood Landy Raddle M.D.   On: 04/21/2024 15:19    Scheduled Meds:  aspirin   81 mg Oral Daily   azithromycin   500 mg Oral Daily   Chlorhexidine  Gluconate Cloth  6 each Topical Daily   dapagliflozin  propanediol  5 mg Oral Daily   enoxaparin  (LOVENOX ) injection  40 mg Subcutaneous Q24H   feeding supplement  237 mL Oral BID BM   furosemide   20 mg Oral Daily   midodrine   10 mg Oral TID WC   mirtazapine   7.5 mg Oral QHS   rosuvastatin   20 mg Oral Daily   spironolactone   12.5 mg Oral Daily   Continuous Infusions:  sodium chloride      norepinephrine  (LEVOPHED ) Adult infusion 5 mcg/min (04/21/24 1443)   piperacillin -tazobactam (ZOSYN )  IV      Total time spent: 90 minutes which includes 60 minutes of critical care time   LOS: 3 days   Zahra Peffley, MD  [facility]  04/21/2024, 5:04 PM   "

## 2024-04-21 NOTE — Consult Note (Signed)
 " Consultation Note Date: 04/21/2024   Patient Name: [name]  DOB: [birth year]  MRN: [id number]  Age / Sex: 89 y.o., male   PCP: [name] LABOR, MD Referring Physician: Mcarthur Pick, MD  Reason for Consultation: Establishing goals of care     Chief Complaint/History of Present Illness:  Patient is a 89 year old male with history of cysts hypertension, asthma, GERD, chronic heart failure with reduced ejection fraction, chronic hypotension on midodrine  who presented to the ED after syncopal episode at home.  He was recently admitted to the hospital and discharged home on 1/23.  There was an episode where he was in room with leaking propane heater prior to admission to the hospital.  He is currently being treated for possible pneumonia and heart failure exacerbation but was also found to have mass like consolidation in the lingula with concern for it being necrotizing pneumonia.  I saw and examined [name] today.  He was pleasant during conversation but reports he does not know where his family is at and does not understand why they have not visited with him.  He denies any specific complaints at this point in time.  He reports that he wants to go back home soon as possible.  He is not really able to relay to me any events surrounding hospitalization or what is going on with him that led him to be here in the hospital.  I called and spoke with patient's daughter, [name].  I introduced palliative care as specialized medical care for people living with serious illness. It focuses on providing relief from the symptoms and stress of a serious illness. The goal is to improve quality of life for both the patient and the family.  She reports that she spoke with one of my colleagues during last admission.  We discussed the continued decline that they have been seeing and [name] including changes in nutrition, cognition, and functional status.  His daughter reports that they understand  that things may be progressing, however, they also wanted to see how he does with home health and attempt at rehab.  This was the plan when he last discharged from the hospital, but due to the weather he has had 1 session since that point in time.  Overall, she reports that her goal is to add as much time and quality to his life but they understand that he is 89 years old with multiple comorbidities and time is limited.  We discussed that the hospital can be useful as long as he is getting well enough from care he receives at the hospital to enjoy his time at home, but there is going to come a time in the near future where, if his goal is to be at home, he may be better served to plan on being at home and bringing care to him at home rather repeated trips to the hospital. We discussed hospice as a tool that may be beneficial in this goal when he reaches a point where we are trying to fix problems that are not fixable.  She reports that at this time family is invested in the plan to take him home to continue trial of rehab as they had tried to do previously. If he does well at home and continues to thrive, I encouraged they continue with this plan. If, however he is unable to regain function and he continues to decline, I recommended that she speak with family and they discuss if he may be better  served by focusing his care on staying at home with support of organization such as hospice.   Primary Diagnoses  Present on Admission:  Pneumonia   Palliative Review of Systems: Patient denies complaints at time of my encounter today.  Past Medical History:  Diagnosis Date   Asthma    Diverticulosis    GERD (gastroesophageal reflux disease)    Gout    Heart murmur    Evaluation by a cardiologist years ago was reportedly negative   HTN (hypertension)    Osteoarthritis    Reflux    Social History   Socioeconomic History   Marital status: Married    Spouse name: [name]   Number of children: Not  on file   Years of education: Not on file   Highest education level: Not on file  Occupational History   Occupation: Work at hospital  Tobacco Use   Smoking status: Former    Types: Pipe    Start date: 12/04/1939    Quit date: 1970    Years since quitting: 56.1   Smokeless tobacco: Never   Tobacco comments:    quit smoking cigarettes in 1971  Vaping Use   Vaping status: Never Used  Substance and Sexual Activity   Alcohol use: No   Drug use: No   Sexual activity: Not Currently  Other Topics Concern   Not on file  Social History Narrative   Married, lives with wife [name]. Daughter [name] helps patient.   Social Drivers of Health   Tobacco Use: Medium Risk (04/19/2024)   Patient History    Smoking Tobacco Use: Former    Smokeless Tobacco Use: Never    Passive Exposure: Not on file  Financial Resource Strain: Low Risk (03/18/2022)   Overall Financial Resource Strain (CARDIA)    Difficulty of Paying Living Expenses: Not hard at all  Food Insecurity: No Food Insecurity (04/19/2024)   Epic    Worried About Radiation Protection Practitioner of Food in the Last Year: Never true    Ran Out of Food in the Last Year: Never true  Transportation Needs: No Transportation Needs (04/19/2024)   Epic    Lack of Transportation (Medical): No    Lack of Transportation (Non-Medical): No  Physical Activity: Not on file  Stress: Not on file  Social Connections: Moderately Isolated (04/19/2024)   Social Connection and Isolation Panel    Frequency of Communication with Friends and Family: More than three times a week    Frequency of Social Gatherings with Friends and Family: More than three times a week    Attends Religious Services: More than 4 times per year    Active Member of [employer] or Organizations: No    Attends Banker Meetings: Never    Marital Status: Widowed  Depression (PHQ2-9): Low Risk (02/02/2024)   Depression (PHQ2-9)    PHQ-2 Score: 0  Alcohol Screen: Not on file  Housing: Low Risk (04/19/2024)    Epic    Unable to Pay for Housing in the Last Year: No    Number of Times Moved in the Last Year: 0    Homeless in the Last Year: No  Utilities: Not At Risk (04/19/2024)   Epic    Threatened with loss of utilities: No  Health Literacy: Not on file   Family History  Problem Relation Age of Onset   Hypertension Mother    Hypertension Father    Heart attack Father    Cancer Brother  colon   Arthritis Other    Heart disease Other        Male < 55   Uterine cancer Maternal Aunt    Scheduled Meds:  aspirin   81 mg Oral Daily   azithromycin   500 mg Oral Daily   Chlorhexidine  Gluconate Cloth  6 each Topical Daily   dapagliflozin  propanediol  5 mg Oral Daily   enoxaparin  (LOVENOX ) injection  40 mg Subcutaneous Q24H   feeding supplement  237 mL Oral BID BM   furosemide   20 mg Oral Daily   midodrine   10 mg Oral TID WC   mirtazapine   7.5 mg Oral QHS   rosuvastatin   20 mg Oral Daily   spironolactone   12.5 mg Oral Daily   Continuous Infusions:  sodium chloride      norepinephrine  (LEVOPHED ) Adult infusion 5 mcg/min (04/21/24 1443)   piperacillin -tazobactam (ZOSYN )  IV 3.375 g (04/21/24 1854)   PRN Meds:.acetaminophen , alum & mag hydroxide-simeth, melatonin, polyethylene glycol, prochlorperazine  Allergies[1] CBC:    Component Value Date/Time   WBC 8.3 04/21/2024 1443   HGB 10.1 (L) 04/21/2024 1443   HGB 12.4 (L) 12/10/2022 1622   HCT 32.7 (L) 04/21/2024 1443   HCT 38.2 12/10/2022 1622   PLT 166 04/21/2024 1443   PLT 143 (L) 12/10/2022 1622   MCV 100.0 04/21/2024 1443   MCV 99 (H) 12/10/2022 1622   NEUTROABS 7.1 04/21/2024 1443   NEUTROABS 3.4 12/10/2022 1622   LYMPHSABS 0.4 (L) 04/21/2024 1443   LYMPHSABS 0.8 12/10/2022 1622   MONOABS 0.6 04/21/2024 1443   EOSABS 0.0 04/21/2024 1443   EOSABS 0.1 12/10/2022 1622   BASOSABS 0.0 04/21/2024 1443   BASOSABS 0.1 12/10/2022 1622   Comprehensive Metabolic Panel:    Component Value Date/Time   NA 137 04/21/2024 1443    NA 146 (H) 01/25/2023 1654   K 3.6 04/21/2024 1443   CL 101 04/21/2024 1443   CO2 20 (L) 04/21/2024 1443   BUN 15 04/21/2024 1443   BUN 19 01/25/2023 1654   CREATININE 1.36 (H) 04/21/2024 1443   CREATININE 1.18 01/11/2014 0928   GLUCOSE 97 04/21/2024 1443   CALCIUM  8.8 (L) 04/21/2024 1443   AST 26 04/18/2024 1423   ALT 9 04/18/2024 1423   ALKPHOS 84 04/18/2024 1423   BILITOT 1.3 (H) 04/18/2024 1423   BILITOT 1.8 (H) 11/05/2020 1013   PROT 6.9 04/19/2024 0702   PROT 6.9 11/05/2020 1013   ALBUMIN 2.8 (L) 04/19/2024 0702   ALBUMIN 4.4 11/05/2020 1013    Physical Exam: Vital Signs: BP 115/73   Pulse (!) 105   Temp 98.5 F (36.9 C) (Axillary)   Resp (!) 24   Ht 5' 11 (1.803 m)   Wt 65.5 kg   SpO2 96%   BMI 20.14 kg/m  SpO2: SpO2: 96 % O2 Device: O2 Device: Nasal Cannula O2 Flow Rate: O2 Flow Rate (L/min): 2 L/min Intake/output summary:  Intake/Output Summary (Last 24 hours) at 04/21/2024 1918 Last data filed at 04/21/2024 0530 Gross per 24 hour  Intake 100 ml  Output 650 ml  Net -550 ml   LBM: Last BM Date : 04/19/24 Baseline Weight: Weight: 60 kg Most recent weight: Weight: 65.5 kg  General: NAD, alert, mildly confused Eyes: conjunctiva clear, anicteric sclera HENT: normocephalic, atraumatic, moist mucous membranes Cardiovascular: RRR, no edema in LE b/l Respiratory: no increased work of breathing noted Abdomen: not distended Extremities:  Skin: no rashes or lesions on visible skin Psych: Mildly confused and unable to answer  some questions.         Palliative Performance Scale: 50              Additional Data Reviewed: Recent Labs    04/19/24 0702 04/21/24 1443  WBC 6.3 8.3  HGB 9.1* 10.1*  PLT 146* 166  NA 140 137  BUN 13 15  CREATININE 1.12 1.36*    Imaging: DG CHEST PORT 1 VIEW CLINICAL DATA:  Shortness of breath  EXAM: PORTABLE CHEST 1 VIEW  COMPARISON:  April 19, 2024.  FINDINGS: Stable cardiomegaly. Moderate size left pleural effusion  which is increased compared to prior exam. Probable small right pleural effusion is noted with associated atelectasis. Right lung base is incompletely visualized on this radiograph however.  IMPRESSION: Moderate size left pleural effusion is noted which is increased compared to prior exam. Probable small right pleural effusion is noted with associated atelectasis.  Electronically Signed   By: Lynwood Landy Raddle M.D.   On: 04/21/2024 15:19    I personally reviewed recent imaging.   Palliative Care Assessment and Plan Summary of Established Goals of Care and Medical Treatment Preferences    # Complex medical decision making/goals of care  - Met with patient and discussed with daughter, [name], this morning via phone with plan for continuation of current interventions and goal for him to return back home with continued trial of rehab as he had only 1 session of physical therapy since his hospital discharge due to weather necessitating cancellation of services for the past couple of weeks.  - Later received message from RN with update about rapid response and family considering transition to hospice care.  While I had discussed hospice with daughter earlier today, family was not at a place for consideration of hospice services at that point in time.  When I was notified about events and family openness to consideration for hospice, I was no longer in the hospital as it was after shift had ended.  I did attempt to call both daughters on numbers listed in chart to further discuss but was unable to reach them today.  - He would certainly be appropriate for home hospice services moving forward.  As of this afternoon when I saw him, he did not appear residential hospice appropriate, however, he has had a change in his condition and this could certainly change at any point moving forward.  Agree with plan for hospice evaluation.   - There is no palliative care rounding service on the weekend at [facility].   Palliative will plan to follow up on Monday if he remains admitted at that time.  -  Code Status: Limited: Do not attempt resuscitation (DNR) -DNR-LIMITED -Do Not Intubate/DNI   Prognosis: < 6 months and he would be appropriate for hospice services at any time moving forward   # Discharge Planning:  Home with Hospice?   Thank you for allowing the palliative care team to participate in the care [name].  Amaryllis Meissner, MD Palliative Care Provider PMT # [phone]  If patient remains symptomatic despite maximum doses, please call PMT at [phone] between 0700 and 1900. Outside of these hours, please call attending, as PMT does not have night coverage.   I personally spent a total of 78 minutes in the care of the patient today including preparing to see the patient, getting/reviewing separately obtained history, performing a medically appropriate exam/evaluation, counseling and educating, and documenting clinical information in the EHR.     [1]  Allergies Allergen  Reactions   Penicillin G Benzathine     Other reaction(s): Unknown   Penicillins Rash   "

## 2024-04-25 ENCOUNTER — Ambulatory Visit: Admitting: Urology

## 2024-06-08 ENCOUNTER — Ambulatory Visit: Admitting: Physician Assistant

## 2024-07-26 ENCOUNTER — Ambulatory Visit: Admitting: Family Medicine
